# Patient Record
Sex: Male | Born: 1970 | Race: Black or African American | Hispanic: No | Marital: Married | Smoking: Never smoker
Health system: Southern US, Community
[De-identification: ages and names within clinical notes are randomized; demographics above are authoritative.]

## PROBLEM LIST (undated history)

## (undated) DIAGNOSIS — G8929 Other chronic pain: Secondary | ICD-10-CM

## (undated) DIAGNOSIS — K859 Acute pancreatitis without necrosis or infection, unspecified: Secondary | ICD-10-CM

## (undated) DIAGNOSIS — N200 Calculus of kidney: Secondary | ICD-10-CM

## (undated) DIAGNOSIS — R58 Hemorrhage, not elsewhere classified: Secondary | ICD-10-CM

## (undated) DIAGNOSIS — R52 Pain, unspecified: Secondary | ICD-10-CM

## (undated) DIAGNOSIS — R109 Unspecified abdominal pain: Secondary | ICD-10-CM

## (undated) DIAGNOSIS — N189 Chronic kidney disease, unspecified: Secondary | ICD-10-CM

## (undated) DIAGNOSIS — K56609 Unspecified intestinal obstruction, unspecified as to partial versus complete obstruction: Secondary | ICD-10-CM

## (undated) DIAGNOSIS — I82409 Acute embolism and thrombosis of unspecified deep veins of unspecified lower extremity: Secondary | ICD-10-CM

## (undated) DIAGNOSIS — D61818 Other pancytopenia: Secondary | ICD-10-CM

## (undated) DIAGNOSIS — K567 Ileus, unspecified: Secondary | ICD-10-CM

## (undated) DIAGNOSIS — I10 Essential (primary) hypertension: Secondary | ICD-10-CM

## (undated) DIAGNOSIS — G43909 Migraine, unspecified, not intractable, without status migrainosus: Secondary | ICD-10-CM

## (undated) DIAGNOSIS — M549 Dorsalgia, unspecified: Secondary | ICD-10-CM

## (undated) HISTORY — PX: IVC FILTER PLACEMENT (ARMC HX): HXRAD1551

## (undated) HISTORY — PX: PANCREAS SURGERY: SHX731

## (undated) HISTORY — PX: LITHOTRIPSY: SUR834

## (undated) HISTORY — PX: OTHER SURGICAL HISTORY: SHX169

## (undated) HISTORY — PX: COLON RESECTION: SHX5231

## (undated) HISTORY — PX: HERNIA REPAIR: SHX51

## (undated) HISTORY — PX: COLOSTOMY REVERSAL: SHX5782

---

## 2001-08-02 ENCOUNTER — Emergency Department (HOSPITAL_COMMUNITY): Admission: EM | Admit: 2001-08-02 | Discharge: 2001-08-02 | Payer: Self-pay | Admitting: Emergency Medicine

## 2001-08-02 ENCOUNTER — Encounter: Payer: Self-pay | Admitting: Emergency Medicine

## 2001-10-06 ENCOUNTER — Emergency Department (HOSPITAL_COMMUNITY): Admission: EM | Admit: 2001-10-06 | Discharge: 2001-10-07 | Payer: Self-pay | Admitting: *Deleted

## 2001-10-07 ENCOUNTER — Encounter: Payer: Self-pay | Admitting: *Deleted

## 2003-12-16 ENCOUNTER — Emergency Department (HOSPITAL_COMMUNITY): Admission: EM | Admit: 2003-12-16 | Discharge: 2003-12-17 | Payer: Self-pay | Admitting: *Deleted

## 2004-12-19 ENCOUNTER — Emergency Department (HOSPITAL_COMMUNITY): Admission: EM | Admit: 2004-12-19 | Discharge: 2004-12-19 | Payer: Self-pay | Admitting: Emergency Medicine

## 2007-03-17 ENCOUNTER — Emergency Department (HOSPITAL_COMMUNITY): Admission: EM | Admit: 2007-03-17 | Discharge: 2007-03-17 | Payer: Self-pay | Admitting: Emergency Medicine

## 2007-03-26 ENCOUNTER — Encounter (INDEPENDENT_AMBULATORY_CARE_PROVIDER_SITE_OTHER): Payer: Self-pay | Admitting: General Surgery

## 2007-03-26 ENCOUNTER — Observation Stay (HOSPITAL_COMMUNITY): Admission: RE | Admit: 2007-03-26 | Discharge: 2007-03-27 | Payer: Self-pay | Admitting: General Surgery

## 2007-04-26 ENCOUNTER — Inpatient Hospital Stay (HOSPITAL_COMMUNITY): Admission: EM | Admit: 2007-04-26 | Discharge: 2007-04-29 | Payer: Self-pay | Admitting: Emergency Medicine

## 2007-04-26 ENCOUNTER — Encounter: Payer: Self-pay | Admitting: *Deleted

## 2007-08-27 ENCOUNTER — Ambulatory Visit (HOSPITAL_COMMUNITY): Admission: RE | Admit: 2007-08-27 | Discharge: 2007-08-27 | Payer: Self-pay | Admitting: Family Medicine

## 2007-11-17 ENCOUNTER — Ambulatory Visit (HOSPITAL_COMMUNITY): Admission: RE | Admit: 2007-11-17 | Discharge: 2007-11-17 | Payer: Self-pay | Admitting: Family Medicine

## 2008-03-17 ENCOUNTER — Emergency Department (HOSPITAL_COMMUNITY): Admission: EM | Admit: 2008-03-17 | Discharge: 2008-03-17 | Payer: Self-pay | Admitting: Emergency Medicine

## 2008-07-12 ENCOUNTER — Emergency Department (HOSPITAL_COMMUNITY): Admission: EM | Admit: 2008-07-12 | Discharge: 2008-07-12 | Payer: Self-pay | Admitting: Emergency Medicine

## 2008-07-19 ENCOUNTER — Emergency Department (HOSPITAL_COMMUNITY): Admission: EM | Admit: 2008-07-19 | Discharge: 2008-07-19 | Payer: Self-pay | Admitting: Emergency Medicine

## 2008-07-20 ENCOUNTER — Emergency Department (HOSPITAL_COMMUNITY): Admission: EM | Admit: 2008-07-20 | Discharge: 2008-07-20 | Payer: Self-pay | Admitting: Emergency Medicine

## 2008-07-28 ENCOUNTER — Ambulatory Visit: Payer: Self-pay | Admitting: Internal Medicine

## 2008-11-14 ENCOUNTER — Emergency Department (HOSPITAL_COMMUNITY): Admission: EM | Admit: 2008-11-14 | Discharge: 2008-11-14 | Payer: Self-pay | Admitting: Emergency Medicine

## 2009-10-12 ENCOUNTER — Emergency Department (HOSPITAL_COMMUNITY): Admission: EM | Admit: 2009-10-12 | Discharge: 2009-10-12 | Payer: Self-pay | Admitting: Emergency Medicine

## 2010-12-24 LAB — COMPREHENSIVE METABOLIC PANEL
Albumin: 4.1 g/dL (ref 3.5–5.2)
Alkaline Phosphatase: 66 U/L (ref 39–117)
BUN: 16 mg/dL (ref 6–23)
Calcium: 9.4 mg/dL (ref 8.4–10.5)
Creatinine, Ser: 1.44 mg/dL (ref 0.4–1.5)
GFR calc Af Amer: >60 mL/min (ref >60)

## 2010-12-24 LAB — DIFFERENTIAL
Basophils Relative: 0 % (ref 0–1)
Eosinophils Absolute: 0.1 10*3/uL (ref 0.0–0.7)
Eosinophils Relative: 1 % (ref 0–5)
Lymphocytes Relative: 24 % (ref 12–46)
Monocytes Relative: 9 % (ref 3–12)

## 2010-12-24 LAB — RAPID URINE DRUG SCREEN, HOSP PERFORMED
Cocaine: NOT DETECTED
Opiates: NOT DETECTED
Tetrahydrocannabinol: NOT DETECTED

## 2010-12-24 LAB — URINALYSIS, ROUTINE W REFLEX MICROSCOPIC
Leukocytes, UA: NEGATIVE
Protein, ur: 30 mg/dL — AB
Specific Gravity, Urine: >1.03 — ABNORMAL HIGH (ref 1.005–1.030)
Urobilinogen, UA: 0.2 mg/dL (ref 0.0–1.0)
pH: 5.5 (ref 5.0–8.0)

## 2010-12-24 LAB — POCT CARDIAC MARKERS: Troponin i, poc: <0.05 ng/mL (ref 0.00–0.09)

## 2010-12-24 LAB — CBC
HCT: 42.3 % (ref 39.0–52.0)
Hemoglobin: 14.4 g/dL (ref 13.0–17.0)
MCV: 82 fL (ref 78.0–100.0)

## 2010-12-24 LAB — LIPASE, BLOOD: Lipase: 35 U/L (ref 11–59)

## 2011-01-23 LAB — CBC
MCV: 82.6 fL (ref 78.0–100.0)
RBC: 5.14 MIL/uL (ref 4.22–5.81)

## 2011-01-23 LAB — COMPREHENSIVE METABOLIC PANEL
ALT: 41 U/L (ref 0–53)
Albumin: 3.8 g/dL (ref 3.5–5.2)
BUN: 12 mg/dL (ref 6–23)
CO2: 27 mEq/L (ref 19–32)
Calcium: 9.5 mg/dL (ref 8.4–10.5)
Creatinine, Ser: 1.53 mg/dL — ABNORMAL HIGH (ref 0.4–1.5)
GFR calc Af Amer: >60 mL/min (ref >60)
Potassium: 3.3 mEq/L — ABNORMAL LOW (ref 3.5–5.1)
Total Bilirubin: 1.3 mg/dL — ABNORMAL HIGH (ref 0.3–1.2)
Total Protein: 7.9 g/dL (ref 6.0–8.3)

## 2011-01-23 LAB — URINE MICROSCOPIC-ADD ON

## 2011-01-23 LAB — URINE CULTURE: Colony Count: 15000

## 2011-01-23 LAB — DIFFERENTIAL
Lymphs Abs: 0.7 10*3/uL (ref 0.7–4.0)
Monocytes Relative: 10 % (ref 3–12)
Neutro Abs: 2.7 10*3/uL (ref 1.7–7.7)
Neutrophils Relative %: 70 % (ref 43–77)

## 2011-01-23 LAB — URINALYSIS, ROUTINE W REFLEX MICROSCOPIC
Leukocytes, UA: NEGATIVE
Urobilinogen, UA: 0.2 mg/dL (ref 0.0–1.0)

## 2011-02-20 NOTE — Op Note (Signed)
NAMEELHADJ, GIRTON NO.:  [id number]   MEDICAL RECORD NO.:  [id number]          PATIENT TYPE:  OBV   LOCATION:  A329                          FACILITY:  [facility]   PHYSICIAN:  Barbaraann Barthel, M.D. DATE OF BIRTH:  1971-08-22   DATE OF PROCEDURE:  03/26/2007  DATE OF DISCHARGE:                               OPERATIVE REPORT   SURGEON:  Barbaraann Barthel, M.D.   PREOPERATIVE DIAGNOSES:  Internal and external hemorrhoids.   POSTOPERATIVE DIAGNOSES:  1. Internal and external hemorrhoids.  2. Question of a perianal mass, possibly a sebaceous cyst.   PROCEDURES:  1. Internal and external hemorrhoidectomy, with excision of perianal      mass.  2. Rigid proctoscopy to 25 cm.   SPECIMENS:  Hemorrhoidal tissue with a possible perianal mass.   OPERATIVE NOTE:  This is a 40 year old black male, who was a little  difficult to examine in the office.  He had presumptively internal and  external hemorrhoids which were very tender.  We discussed the need for  surgery with him, discussing complications not limited to, but including  bleeding and infection.  An informed consent was obtained.   GROSS OPERATIVE FINDINGS:  The patient had, what appeared to be,  possibly a sebaceous cyst on the left side in the left anterior side of  the perianal ring; and, some hemorrhoidal tissue which was prominent  internally in the same area.   TECHNIQUE:  The patient was placed in the jackknife prone position,  after adequate administration of spinal anesthesia.  We then did a  rectal examination and a rigid proctoscopy to 25 cm.  No abnormalities  were encountered.  I then anesthetized this area to elevate the lesion,  and then dissected this and put a Bowie hemorrhoidal clamp around this.  I then excised it with the cautery device.  I oversewed this with a 2-0  chromic suture, beginning in the at the apex internally and then looping  this over the [facility] hemorrhoidal clamp.  I then  removed the clamp,  tightening the sutures; and then suturing from external to internally  with a locking suture, and then ligating this internally.  We checked  for hemostasis and we then irrigated with normal saline solution.  I  used a perianal block of 0.5% Sensorcaine without epinephrine.  We  checked for hemostasis and this was deemed complete.   DISPOSITION:  The patient tolerated procedure well; was taken to the  recovery room in satisfactory condition.   ESTIMATED BLOOD LOSS:  Minimal.   FLUIDS:  The patient received 100 mL of Crystalloid intraoperatively.   COMPLICATIONS:  There were no complications.      Barbaraann Barthel, M.D.  Electronically Signed     WB/MEDQ  D:  03/26/2007  T:  03/26/2007  Job:  [id number]
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
## 2011-02-20 NOTE — H&P (Signed)
NAME:  Peter [name], Peter [name]               ACCOUNT NO.:  [id number]   MEDICAL RECORD NO.:  [id number]          PATIENT TYPE:  AMB   LOCATION:  DAY                           FACILITY:  [facility]   PHYSICIAN:  R. Roetta Sessions, M.D. DATE OF BIRTH:  1971/07/20   DATE OF ADMISSION:  DATE OF DISCHARGE:                              HISTORY & PHYSICAL   REFERRING PHYSICIAN:  Patrica Duel, MD   REASON FOR CONSULTATION:  Hematemesis, chest and abdominal pain.   HISTORY OF PRESENT ILLNESS:  Peter [name] is a pleasant 40 year old  African American male sent at the courtesy of [facility] to further evaluate a several-week history of left-sided  chest, shoulder, and abdominal pain.  He has a history of multiple  anxiety attacks where his wife accompanies him today states he becomes  jumpy, agitated, and has hallucinations.  He has been to the emergency  room on couple of occasions for this problem.  On one occasion, it is  reported that he developed hematemesis while in the emergency  department.  There was 1 episode blood was seen in his vomitus.  There  was no further associated symptoms.  Peter [name] has heartburn symptoms 2-  3 times weekly.  He is not on any acid suppression.  No odynophagia, no  dysphagia, or early satiety.  He has not had any melena or rectal  bleeding.  Abdominal pain, sometimes occurs with left-sided chest pain,  but is not affected by eating.  He denies weight loss.  Abdominal pain  is not affected by having a bowel movement.  He has been taking four to  six 200 mg ibuprofens daily for headache as well.  He saw Dr. Orvan Falconer  recently and he is going to have a stress test in [facility] tomorrow to  further evaluate his atypical chest pain.  He does tell me that he  drinks liquor in the form of weekend shots with his friends.  He has  been utilizing more weekend alcohol over the past several weeks citing  increased stress, but stopped drinking completely 1 week  ago.  He denies  illicit drug use.  He was started on Librium recently by the folks down  at [facility].  He also takes Lortab p.r.n. pain.  There is no  history of peptic ulcer disease or other gastrointestinal illness aside  from having hemorrhoid surgery performed by Dr. Malvin Johns previously.  He  was seen by Dr. Colon Branch in ED on July 19, 2008.  He had a battery of  labs.  Drug screen was negative.  CBC revealed white count of 3.8, H&H  13.7, 40.3, MCV 82.6.  Sodium 136, potassium 3.8, chloride 103, carbon  dioxide 30, glucose 88.  BUN 11, creatinine 1.07, calcium 9.6, albumin  4.2, SGOT 36, SGPT 29, total bilirubin 0.5.   PAST MEDICAL HISTORY:  Significant for chronic back pain, anxiety,  neurosis, and headaches.   PAST SURGERIES:  Hemorrhoidal surgery by Dr. Malvin Johns.   CURRENT MEDICATIONS:  1. Lortab p.r.n.  2. Librium 25 mg 1 t.i.d. to q.i.d.  3. Ibuprofen  OTC p.r.n.   ALLERGIES:  No known drug allergies   FAMILY HISTORY:  Father had MI but he is in his 44s.  He is alive.  Mother age 50 in good health.  No history of chronic GI or liver  illness.   SOCIAL HISTORY:  The patient is married, he has 5 children, all healthy,  1 with asthma.  He is employed as a Estate agent, but he has been  taken out of work while the current medical evaluation is being done.  He does not use tobacco.  Alcohol consumption is as outlined above.  No  illicit drug use.   REVIEW OF SYSTEMS:  No change in weight.  No fever, chills, or night  sweats.  The patient has chronic insomnia.  He snores heavily.  His wife  says he stops breathing frequently during the night and has to be  awakened.   PHYSICAL EXAMINATION:  GENERAL:  Pleasant 40 year old gentleman resting  comfortably accompanied by his wife.  VITAL SIGNS:  Weight 237, height 5 feet 10, temp 98.2, BP 122/92, and  pulse 72.  SKIN:  Warm and dry.  HEENT:  No scleral icterus.  Conjunctivae pink.  Oral cavity no lesions.   CHEST:  Lungs clear to auscultation.  CARDIAC:  Regular rate and rhythm without murmur, rub, or gallop.  ABDOMEN:  Obese, nondistended.  Positive bowel sounds, entirely soft,  nontender without appreciable mass or organomegaly.  EXTREMITIES:  No  edema.   IMPRESSION:  Peter [name] is a very pleasant 40 year old gentleman with  recent problems with atypical left-sided chest and abdominal pain and  anxiety attacks.  He had a single episode of vomiting with blood in  vomitus in the ED recently.  He has been taking quite a bit of ibuprofen  over the past several weeks.   He certainly at this point does not appear to be in any acute distress.  He does have background symptoms of gastroesophageal reflux disease.   I am glad to see he is getting a cardiac evaluation, although his chest  symptoms are quite atypical for coronary artery disease (as they do for  GI etiology as well).   RECOMMENDATIONS:  1. I told Peter [name] not take any more nonsteroidal agents until we      sort out his GI complaints.  2. We will start him on some Aciphex 20 mg orally daily and 30 mg      before breakfast, 14 samples provided.  3. We will go ahead and offer Ms. Katherine [name] an EGD in the near future to      further evaluate his hematemesis.   Because of his recent problems with anxiety and his alcohol consumption,  I feel he will be difficult as a conscious sedation on the procedure.  Therefore, I will list the services of [facility]. [facility] to help  [place] with a propofol anesthesia in the operating room.  We will make  further recommendations in the very near future.   I would thank Dr. [facility] for allowing me to see this  nice gentleman today.      Jonathon Bellows, M.D.  Electronically Signed     RMR/MEDQ  D:  07/28/2008  T:  07/29/2008  Job:  [id number]

## 2011-02-27 ENCOUNTER — Emergency Department (HOSPITAL_COMMUNITY)
Admission: EM | Admit: 2011-02-27 | Discharge: 2011-02-27 | Disposition: A | Discharge status: Home / Self Care | Payer: 59 | Attending: Emergency Medicine | Admitting: Emergency Medicine

## 2011-02-27 ENCOUNTER — Emergency Department (HOSPITAL_COMMUNITY): Payer: 59

## 2011-02-27 DIAGNOSIS — R42 Dizziness and giddiness: Secondary | ICD-10-CM | POA: Insufficient documentation

## 2011-02-27 DIAGNOSIS — F411 Generalized anxiety disorder: Secondary | ICD-10-CM | POA: Insufficient documentation

## 2011-02-27 DIAGNOSIS — I10 Essential (primary) hypertension: Secondary | ICD-10-CM | POA: Insufficient documentation

## 2011-02-27 DIAGNOSIS — M79609 Pain in unspecified limb: Secondary | ICD-10-CM | POA: Insufficient documentation

## 2011-07-09 LAB — COMPREHENSIVE METABOLIC PANEL
ALT: 29
BUN: 11
CO2: 30
Calcium: 9.5
Creatinine, Ser: 1.07
GFR calc non Af Amer: >60
Glucose, Bld: 88

## 2011-07-09 LAB — DIFFERENTIAL
Basophils Absolute: 0
Eosinophils Absolute: 0.1
Lymphocytes Relative: 25
Lymphocytes Relative: 28
Lymphocytes Relative: 40
Lymphs Abs: 1
Lymphs Abs: 1.1
Lymphs Abs: 2.1
Monocytes Absolute: 0.3
Monocytes Relative: 7
Neutro Abs: 2.6
Neutro Abs: 2.7
Neutrophils Relative %: 60
Neutrophils Relative %: 67

## 2011-07-09 LAB — RAPID URINE DRUG SCREEN, HOSP PERFORMED
Barbiturates: NOT DETECTED
Benzodiazepines: NOT DETECTED
Cocaine: NOT DETECTED

## 2011-07-09 LAB — ETHANOL: Alcohol, Ethyl (B): 181 — ABNORMAL HIGH

## 2011-07-09 LAB — POCT CARDIAC MARKERS
Myoglobin, poc: 110
Myoglobin, poc: 127
Myoglobin, poc: 213
Myoglobin, poc: 252
Troponin i, poc: <0.05

## 2011-07-09 LAB — CBC
HCT: 39.5
HCT: 40.3
Hemoglobin: 13.3
Hemoglobin: 13.7
Hemoglobin: 13.9
MCHC: 33.9
MCV: 82.6
MCV: 83.1
Platelets: 189
RBC: 4.75
RBC: 4.88
RDW: 15.1
WBC: 3.9 — ABNORMAL LOW
WBC: 5.2

## 2011-07-09 LAB — D-DIMER, QUANTITATIVE: D-Dimer, Quant: 0.46

## 2011-07-09 LAB — BASIC METABOLIC PANEL
BUN: 17
Calcium: 9
Chloride: 110
GFR calc non Af Amer: >60
Glucose, Bld: 108 — ABNORMAL HIGH
Potassium: 4

## 2011-07-09 LAB — TROPONIN I: Troponin I: 0.02

## 2011-07-23 LAB — BASIC METABOLIC PANEL
BUN: 11
CO2: 28
Calcium: 8.1 — ABNORMAL LOW
Calcium: 8.3 — ABNORMAL LOW
Creatinine, Ser: 1.37
GFR calc Af Amer: >60
GFR calc non Af Amer: 58 — ABNORMAL LOW
Potassium: 3.6
Sodium: 137

## 2011-07-23 LAB — DIFFERENTIAL
Eosinophils Absolute: 0
Lymphocytes Relative: 24
Lymphs Abs: 1.5
Neutrophils Relative %: 67

## 2011-07-23 LAB — COMPREHENSIVE METABOLIC PANEL
CO2: 26
Calcium: 8.3 — ABNORMAL LOW
Creatinine, Ser: 2.14 — ABNORMAL HIGH
GFR calc non Af Amer: 35 — ABNORMAL LOW
Glucose, Bld: 121 — ABNORMAL HIGH

## 2011-07-23 LAB — CBC
HCT: 34.4 — ABNORMAL LOW
Hemoglobin: 12.4 — ABNORMAL LOW
MCHC: 33.4
MCHC: 33.5
MCV: 81.7
MCV: 81.6
Platelets: 146 — ABNORMAL LOW
Platelets: 157
RBC: 4.55
RDW: 14.5 — ABNORMAL HIGH
RDW: 14.8 — ABNORMAL HIGH
WBC: 4.6

## 2011-07-23 LAB — URINALYSIS, ROUTINE W REFLEX MICROSCOPIC
Glucose, UA: NEGATIVE
Ketones, ur: NEGATIVE
Protein, ur: 30 — AB

## 2011-07-23 LAB — OCCULT BLOOD X 1 CARD TO LAB, STOOL: Fecal Occult Bld: NEGATIVE

## 2011-07-25 LAB — CBC
HCT: 41.2
Hemoglobin: 14.4
WBC: 4

## 2011-07-25 LAB — BASIC METABOLIC PANEL
Calcium: 9.6
GFR calc Af Amer: >60
GFR calc non Af Amer: 56 — ABNORMAL LOW
Potassium: 4.1
Sodium: 134 — ABNORMAL LOW

## 2011-07-25 LAB — DIFFERENTIAL
Basophils Absolute: 0
Eosinophils Relative: 3
Lymphocytes Relative: 32
Lymphs Abs: 1.3
Monocytes Absolute: 0.4
Neutro Abs: 2.2

## 2011-10-15 ENCOUNTER — Other Ambulatory Visit: Payer: Self-pay

## 2011-10-15 ENCOUNTER — Inpatient Hospital Stay (HOSPITAL_COMMUNITY)
Admission: EM | Admit: 2011-10-15 | Discharge: 2011-10-16 | DRG: 313 | Disposition: A | Discharge status: Home / Self Care | Payer: Self-pay | Attending: Internal Medicine | Admitting: Internal Medicine

## 2011-10-15 ENCOUNTER — Emergency Department (HOSPITAL_COMMUNITY): Payer: Self-pay

## 2011-10-15 DIAGNOSIS — I10 Essential (primary) hypertension: Secondary | ICD-10-CM | POA: Diagnosis present

## 2011-10-15 DIAGNOSIS — E669 Obesity, unspecified: Secondary | ICD-10-CM | POA: Diagnosis present

## 2011-10-15 DIAGNOSIS — R079 Chest pain, unspecified: Secondary | ICD-10-CM | POA: Diagnosis present

## 2011-10-15 DIAGNOSIS — R000 Tachycardia, unspecified: Secondary | ICD-10-CM | POA: Diagnosis present

## 2011-10-15 DIAGNOSIS — F1010 Alcohol abuse, uncomplicated: Secondary | ICD-10-CM | POA: Diagnosis present

## 2011-10-15 DIAGNOSIS — M79609 Pain in unspecified limb: Secondary | ICD-10-CM | POA: Diagnosis present

## 2011-10-15 DIAGNOSIS — R0789 Other chest pain: Principal | ICD-10-CM | POA: Diagnosis present

## 2011-10-15 DIAGNOSIS — R0602 Shortness of breath: Secondary | ICD-10-CM | POA: Diagnosis present

## 2011-10-15 DIAGNOSIS — Z6830 Body mass index (BMI) 30.0-30.9, adult: Secondary | ICD-10-CM

## 2011-10-15 HISTORY — DX: Essential (primary) hypertension: I10

## 2011-10-15 LAB — RAPID URINE DRUG SCREEN, HOSP PERFORMED
Barbiturates: NOT DETECTED
Benzodiazepines: NOT DETECTED
Cocaine: NOT DETECTED

## 2011-10-15 LAB — BASIC METABOLIC PANEL
BUN: 14 mg/dL (ref 6–23)
CO2: 27 mEq/L (ref 19–32)
Calcium: 9.7 mg/dL (ref 8.4–10.5)
Chloride: 98 mEq/L (ref 96–112)
Creatinine, Ser: 1.34 mg/dL (ref 0.50–1.35)

## 2011-10-15 LAB — CBC
HCT: 41 % (ref 39.0–52.0)
Hemoglobin: 13.3 g/dL (ref 13.0–17.0)
MCHC: 32.4 g/dL (ref 30.0–36.0)
MCV: 83.2 fL (ref 78.0–100.0)
RDW: 14.6 % (ref 11.5–15.5)
WBC: 2.5 10*3/uL — ABNORMAL LOW (ref 4.0–10.5)

## 2011-10-15 LAB — LIPID PANEL
HDL: 42 mg/dL (ref >39)
LDL Cholesterol: 85 mg/dL (ref 0–99)
Total CHOL/HDL Ratio: 4.6 RATIO
Triglycerides: 324 mg/dL — ABNORMAL HIGH (ref <150)

## 2011-10-15 LAB — D-DIMER, QUANTITATIVE: D-Dimer, Quant: 1.78 ug/mL-FEU — ABNORMAL HIGH (ref 0.00–0.48)

## 2011-10-15 LAB — DIFFERENTIAL
Basophils Absolute: 0 10*3/uL (ref 0.0–0.1)
Eosinophils Relative: 1 % (ref 0–5)
Lymphocytes Relative: 30 % (ref 12–46)
Monocytes Absolute: 0.3 10*3/uL (ref 0.1–1.0)
Monocytes Relative: 12 % (ref 3–12)
Neutro Abs: 1.4 10*3/uL — ABNORMAL LOW (ref 1.7–7.7)

## 2011-10-15 LAB — POCT I-STAT TROPONIN I

## 2011-10-15 MED ORDER — ADULT MULTIVITAMIN W/MINERALS CH
1.0000 | ORAL_TABLET | Freq: Every day | ORAL | Status: DC
  Administered 2011-10-15 – 2011-10-16 (×2): 1 via ORAL
  Filled 2011-10-15 (×2): qty 1

## 2011-10-15 MED ORDER — THIAMINE HCL 100 MG/ML IJ SOLN: 100.0000 mg | Freq: Every day | INTRAMUSCULAR | Status: DC

## 2011-10-15 MED ORDER — ONDANSETRON HCL 4 MG PO TABS: 4.0000 mg | ORAL_TABLET | Freq: Four times a day (QID) | ORAL | Status: DC | PRN

## 2011-10-15 MED ORDER — ONDANSETRON HCL 4 MG/2ML IJ SOLN: 4.0000 mg | Freq: Four times a day (QID) | INTRAMUSCULAR | Status: DC | PRN

## 2011-10-15 MED ORDER — LORAZEPAM 2 MG/ML IJ SOLN: 0.0000 mg | Freq: Two times a day (BID) | INTRAMUSCULAR | Status: DC

## 2011-10-15 MED ORDER — ASPIRIN 81 MG PO CHEW
324.0000 mg | CHEWABLE_TABLET | Freq: Once | ORAL | Status: AC
  Administered 2011-10-15: 324 mg via ORAL
  Filled 2011-10-15: qty 4

## 2011-10-15 MED ORDER — SODIUM CHLORIDE 0.9 % IJ SOLN
INTRAMUSCULAR | Status: AC
  Administered 2011-10-15: 10 mL
  Filled 2011-10-15: qty 3

## 2011-10-15 MED ORDER — ACETAMINOPHEN 650 MG RE SUPP: 650.0000 mg | Freq: Four times a day (QID) | RECTAL | Status: DC | PRN

## 2011-10-15 MED ORDER — ENOXAPARIN SODIUM 40 MG/0.4ML ~~LOC~~ SOLN
40.0000 mg | SUBCUTANEOUS | Status: DC
  Administered 2011-10-15: 40 mg via SUBCUTANEOUS
  Filled 2011-10-15: qty 0.4

## 2011-10-15 MED ORDER — ASPIRIN EC 325 MG PO TBEC
325.0000 mg | DELAYED_RELEASE_TABLET | Freq: Every day | ORAL | Status: DC
  Administered 2011-10-15 – 2011-10-16 (×2): 325 mg via ORAL
  Filled 2011-10-15 (×2): qty 1

## 2011-10-15 MED ORDER — SODIUM CHLORIDE 0.45 % IV SOLN
INTRAVENOUS | Status: DC
  Administered 2011-10-15: 17:00:00 via INTRAVENOUS
  Administered 2011-10-16: 1000 mL via INTRAVENOUS
  Administered 2011-10-16: 13:00:00 via INTRAVENOUS

## 2011-10-15 MED ORDER — LORAZEPAM 2 MG/ML IJ SOLN
1.0000 mg | Freq: Four times a day (QID) | INTRAMUSCULAR | Status: DC | PRN
  Administered 2011-10-16: 1 mg via INTRAVENOUS

## 2011-10-15 MED ORDER — HYDROCODONE-ACETAMINOPHEN 5-325 MG PO TABS
1.0000 | ORAL_TABLET | ORAL | Status: DC | PRN
  Administered 2011-10-16: 1 via ORAL
  Filled 2011-10-15: qty 1

## 2011-10-15 MED ORDER — PANTOPRAZOLE SODIUM 40 MG PO TBEC
40.0000 mg | DELAYED_RELEASE_TABLET | Freq: Every day | ORAL | Status: DC
  Administered 2011-10-15 – 2011-10-16 (×2): 40 mg via ORAL
  Filled 2011-10-15 (×2): qty 1

## 2011-10-15 MED ORDER — ONDANSETRON HCL 4 MG/2ML IJ SOLN
4.0000 mg | Freq: Once | INTRAMUSCULAR | Status: AC
  Administered 2011-10-15: 4 mg via INTRAVENOUS

## 2011-10-15 MED ORDER — NITROGLYCERIN 0.4 MG SL SUBL
0.4000 mg | SUBLINGUAL_TABLET | Freq: Once | SUBLINGUAL | Status: AC
  Administered 2011-10-15: 0.4 mg via SUBLINGUAL
  Filled 2011-10-15: qty 25

## 2011-10-15 MED ORDER — SODIUM CHLORIDE 0.9 % IJ SOLN
INTRAMUSCULAR | Status: AC
  Administered 2011-10-15: 3 mL
  Filled 2011-10-15: qty 3

## 2011-10-15 MED ORDER — ACETAMINOPHEN 325 MG PO TABS: 650.0000 mg | ORAL_TABLET | Freq: Four times a day (QID) | ORAL | Status: DC | PRN

## 2011-10-15 MED ORDER — FOLIC ACID 1 MG PO TABS
1.0000 mg | ORAL_TABLET | Freq: Every day | ORAL | Status: DC
  Administered 2011-10-15 – 2011-10-16 (×2): 1 mg via ORAL
  Filled 2011-10-15 (×2): qty 1

## 2011-10-15 MED ORDER — LORAZEPAM 1 MG PO TABS: 1.0000 mg | ORAL_TABLET | Freq: Four times a day (QID) | ORAL | Status: DC | PRN

## 2011-10-15 MED ORDER — VITAMIN B-1 100 MG PO TABS
100.0000 mg | ORAL_TABLET | Freq: Every day | ORAL | Status: DC
  Administered 2011-10-15 – 2011-10-16 (×2): 100 mg via ORAL
  Filled 2011-10-15 (×2): qty 1

## 2011-10-15 MED ORDER — LORAZEPAM 2 MG/ML IJ SOLN
0.0000 mg | Freq: Four times a day (QID) | INTRAMUSCULAR | Status: DC
  Administered 2011-10-15 – 2011-10-16 (×3): 2 mg via INTRAVENOUS
  Filled 2011-10-15 (×3): qty 1

## 2011-10-15 MED ORDER — ONDANSETRON HCL 4 MG/2ML IJ SOLN
INTRAMUSCULAR | Status: AC
  Filled 2011-10-15: qty 2

## 2011-10-15 MED ORDER — MORPHINE SULFATE 4 MG/ML IJ SOLN
4.0000 mg | Freq: Once | INTRAMUSCULAR | Status: AC
  Administered 2011-10-15: 4 mg via INTRAVENOUS
  Filled 2011-10-15: qty 1

## 2011-10-15 MED ORDER — MORPHINE SULFATE 2 MG/ML IJ SOLN: 1.0000 mg | INTRAMUSCULAR | Status: DC | PRN

## 2011-10-15 NOTE — ED Notes (Signed)
Dr. Kerry Hough here for assessment of pt. Pt still currently waiting for bed placement. Pt aware of delay

## 2011-10-15 NOTE — H&P (Signed)
PCP:   Colette Ribas, MD, MD   Chief Complaint:  Chest pain  HPI: This is a 41 year old African American male with the history of hypertension in the past but is currently not on any medications. Patient also has a history of alcohol abuse. He drinks on a daily basis and drinks hard liquor. He cannot quantify for how much he drinks. He does report that he does have signs of withdrawal he stopped drinking. The patient presents to the emergency room today with complaints of left-sided chest pain. He reports the onset of these symptoms approximately 3 days ago and describes it as a sharp stabbing pain. Pain occurs in the left side of his chest and he reports radiates around to his back and down to his left arm. He does not notice any worsening of the pain by lifting his arm. He thinks it might get worse with deep breathing but he is not sure. He reports the onset of symptoms at rest. Exertion has made the pain worse at times but at other times it has had no effect. He reports that it has improved after receiving some nitroglycerin in the emergency room. He reports associated diaphoresis, and has felt somewhat short of breath. He reports vomiting for the past few days. Denies any diarrhea, dysuria. He's also been somewhat lightheaded.  She reports that he had similar symptoms in the past. He apparently seen a cardiologist in refill, he does not know which group it was. He reports being taken in [place] for a cardiac catheterization and per patient was not found to have any obstructions. I have reviewed the records in both epic and echart and cannot find any catheterization reports. He is apparently also seen gastroenterology in the past for chest pain and vomiting. Per records he was undergoing a concurrent cardiology evaluation at that time as well. Details of his prior cardiac workup are not entirely clear at this time.  Patient was evaluated in the emergency room, EKG did not show any acute  changes, point-of-care troponins have been negative, patient has been referred for admission to rule out myocardial infarction.  Allergies:  No Known Allergies    Past Medical History  Diagnosis Date  . Hypertension     Past Surgical History  Procedure Date  . Hernia repair     Prior to Admission medications   Medication Sig Start Date End Date Taking? Authorizing Provider  acetaminophen (TYLENOL) 500 MG tablet Take 1,000 mg by mouth every 6 (six) hours as needed. For pain    Yes Historical Provider, MD  ibuprofen (ADVIL,MOTRIN) 200 MG tablet Take 400 mg by mouth every 6 (six) hours as needed. For pain    Yes Historical Provider, MD    Social History:  reports that he has never smoked. He does not have any smokeless tobacco history on file. He reports that he drinks alcohol. He reports that he does not use illicit drugs. his last drink was approximately 2 days ago.  No family history on file.  Review of Systems: Positives in bold Constitutional: Denies fever, chills, diaphoresis, appetite change and fatigue.  HEENT: Denies photophobia, eye pain, redness, hearing loss, ear pain, congestion, sore throat, rhinorrhea, sneezing, mouth sores, trouble swallowing, neck pain, neck stiffness and tinnitus.   Respiratory: Denies SOB, DOE, cough, chest tightness,  and wheezing.   Cardiovascular: Denies chest pain, palpitations and leg swelling.  Gastrointestinal: Denies nausea, vomiting, abdominal pain, diarrhea, constipation, blood in stool and abdominal distention.  Genitourinary: Denies dysuria, urgency,  frequency, hematuria, flank pain and difficulty urinating.  Musculoskeletal: Denies myalgias, back pain, joint swelling, arthralgias and gait problem.  Skin: Denies pallor, rash and wound.  Neurological: Denies dizziness, seizures, syncope, weakness, light-headedness, numbness and headaches.  Hematological: Denies adenopathy. Easy bruising, personal or family bleeding history    Psychiatric/Behavioral: Denies suicidal ideation, mood changes, confusion, nervousness, sleep disturbance and agitation   Physical Exam: Blood pressure 141/101, pulse 83, temperature 98.3 F (36.8 C), temperature source Oral, resp. rate 17, height 5\' 10"  (1.778 m), weight 117.935 kg (260 lb), SpO2 98.00%. General: Patient is in no acute distress, sitting up in bed, he doesappear to be diaphoretic HEENT: Normocephalic, atraumatic, pupils are equal round reactive to light Neck: Supple Chest: Clear to auscultation bilaterally, no reproducible chest pain on palpation Cardiac: S1, S2, tachycardic, regular Abdomen: Soft, nontender, nondistended, bowel sounds are active Extremities: No edema, cyanosis, or clubbing Neurologic: Grossly intact, non focal Skin: Warm, diaphoretic  Labs on Admission:  Results for orders placed during the hospital encounter of 10/15/11 (from the past 48 hour(s))  POCT I-STAT TROPONIN I     Status: Normal   Collection Time   10/15/11  9:41 AM      Component Value Range Comment   Troponin i, poc 0.00  0.00 - 0.08 (ng/mL)    Comment 3            CBC     Status: Abnormal   Collection Time   10/15/11  9:44 AM      Component Value Range Comment   WBC 2.5 (*) 4.0 - 10.5 (K/uL)    RBC 4.93  4.22 - 5.81 (MIL/uL)    Hemoglobin 13.3  13.0 - 17.0 (g/dL)    HCT 13.0  86.5 - 78.4 (%)    MCV 83.2  78.0 - 100.0 (fL)    MCH 27.0  26.0 - 34.0 (pg)    MCHC 32.4  30.0 - 36.0 (g/dL)    RDW 69.6  29.5 - 28.4 (%)    Platelets 146 (*) 150 - 400 (K/uL)   DIFFERENTIAL     Status: Abnormal   Collection Time   10/15/11  9:44 AM      Component Value Range Comment   Neutrophils Relative 57  43 - 77 (%)    Neutro Abs 1.4 (*) 1.7 - 7.7 (K/uL)    Lymphocytes Relative 30  12 - 46 (%)    Lymphs Abs 0.7  0.7 - 4.0 (K/uL)    Monocytes Relative 12  3 - 12 (%)    Monocytes Absolute 0.3  0.1 - 1.0 (K/uL)    Eosinophils Relative 1  0 - 5 (%)    Eosinophils Absolute 0.0  0.0 - 0.7 (K/uL)     Basophils Relative 0  0 - 1 (%)    Basophils Absolute 0.0  0.0 - 0.1 (K/uL)   BASIC METABOLIC PANEL     Status: Abnormal   Collection Time   10/15/11  9:44 AM      Component Value Range Comment   Sodium 136  135 - 145 (mEq/L)    Potassium 3.9  3.5 - 5.1 (mEq/L)    Chloride 98  96 - 112 (mEq/L)    CO2 27  19 - 32 (mEq/L)    Glucose, Bld 130 (*) 70 - 99 (mg/dL)    BUN 14  6 - 23 (mg/dL)    Creatinine, Ser 1.32  0.50 - 1.35 (mg/dL)    Calcium 9.7  8.4 -  10.5 (mg/dL)    GFR calc non Af Amer 64 (*) >90 (mL/min)    GFR calc Af Amer 74 (*) >90 (mL/min)   POCT I-STAT TROPONIN I     Status: Normal   Collection Time   10/15/11 11:27 AM      Component Value Range Comment   Troponin i, poc 0.00  0.00 - 0.08 (ng/mL)    Comment 3              Radiological Exams on Admission: Dg Chest Portable 1 View  10/15/2011  *RADIOLOGY REPORT*  Clinical Data: Chest pain and cough.  PORTABLE CHEST - 1 VIEW  Comparison: None  Findings: Heart size and vascularity are normal and the lungs are clear.  No osseous abnormality.  IMPRESSION: Normal chest.  Original Report Authenticated By: Gwynn Burly, M.D.    Assessment/Plan Principal Problem:  *Chest pain Active Problems:  HTN (hypertension)  Tachycardia  Alcohol abuse  Obesity  Plan:  Patient will be admitted to a telemetry unit for further workup. We will cycle his cardiac markers every 8 hours x3. His chest pain has typical and atypical features. We will also ask for cardiology consultation. To further risk stratify him we will check a hemoglobin A1c as well as fasting lipid panel. We will check a 2-D echocardiogram and check a urine drug screen. We will also check a d-dimer, if this is positive then patient will need a CT of the chest to rule out PE. He will be continued on aspirin.  Certainly there is concern for the patient going into delirium tremens. His last drink was approximately 48 hours ago, therefore he is in the window to undergo withdrawal.  We will start him on Ativan CIWA protocol. It is possible that his tachycardia, diaphoresis, hypertension may be related to alcohol withdrawal  The patient's history of alcohol abuse as well as concurrent ibuprofen use may have caused a gastritis which could be contributing to his discomfort. We will start him on a proton pump inhibitor. He'll be gently hydrated with IV fluids  The patient does have some leukopenia which appears to be chronic. This may be due to his alcohol abuse. We will check a serum HIV.  Time Spent on Admission:  Chibueze Beasley [facility] 10/15/2011, 2:56 PM

## 2011-10-15 NOTE — ED Notes (Signed)
Pt c/o lower abd pain per Tarri Glenn RN. Dr. Bebe Shaggy aware and new order placed for morphine.

## 2011-10-15 NOTE — ED Notes (Signed)
Report called and attempted. Per [place] call back. Unable to take report.

## 2011-10-15 NOTE — ED Notes (Signed)
Pt vomiting at this time. Dr. Bebe Shaggy aware and new order received.

## 2011-10-15 NOTE — ED Notes (Signed)
Pt currently waiting for bed placement.  

## 2011-10-15 NOTE — ED Notes (Signed)
C/o left sided chest pain for 3 days. Pt states pain worse when lying down. Also c/o cough as well. Denies any n/v or sob. nad noted.

## 2011-10-15 NOTE — ED Provider Notes (Signed)
History   Scribed for Joya Gaskins, MD, the patient was seen in [facility]. The chart was scribed by Gilman Schmidt. The patients care was started at 9:32 AM.   CSN: [id number]  Arrival date & time 10/15/11  0906   First MD Initiated Contact with Patient 10/15/11 [phone]      Chief Complaint  Patient presents with  . Cough  . Chest Pain     HPI Peter [name] is a 40 y.o. male with a history of HTN who presents to the Emergency Department complaining of cough and left sided chest pain that radiates to arm and side onset three days. Pain is described as a sharp and shooting and lasting 5-6 minutes (off and on all day) then subsiding on its own. States that pain came without exertion while laying in bed. Symptoms are exacerbated by walking. Pt has had similar symptoms and has been seen in ED. Also notes diaphoresis, SOB,  and light headedness. Pt has not taken ASA. States pain is "not too bad currently". There are no other associated symptoms and no other alleviating or aggravating factors.  Denies known h/o DVT/PE   Past Medical History  Diagnosis Date  . Hypertension     Past Surgical History  Procedure Date  . Hernia repair     Fam hx - +CAD  History  Substance Use Topics  . Smoking status: Never Smoker   . Smokeless tobacco: Not on file  . Alcohol Use: Yes      Review of Systems  Constitutional: Positive for diaphoresis.  Respiratory: Positive for cough and shortness of breath.   Cardiovascular: Positive for chest pain.  Gastrointestinal: Negative for nausea, vomiting and diarrhea.  Neurological: Positive for light-headedness.  All other systems reviewed and are negative.    Allergies  Review of patient's allergies indicates no known allergies.  Home Medications  No current outpatient prescriptions on file.  BP 167/116  Pulse 92  Temp(Src) 98.3 F (36.8 C) (Oral)  Resp 18  Ht 5\' 10"  (1.778 m)  Wt 260 lb (117.935 kg)  BMI 37.31 kg/m2  SpO2  98%  Physical Exam CONSTITUTIONAL: Well developed/well nourished HEAD AND FACE: Normocephalic/atraumatic EYES: EOMI/PERRL ENMT: Mucous membranes moist NECK: supple no meningeal signs SPINE:entire spine nontender CV: S1/S2 noted, no murmurs/rubs/gallops noted LUNGS: Lungs are clear to auscultation bilaterally, no apparent distress ABDOMEN: soft, nontender, no rebound or guarding GU:no cva tenderness NEURO: Pt is awake/alert, moves all extremitiesx4 EXTREMITIES: pulses normal, full ROM SKIN: warm, color normal PSYCH: no abnormalities of mood noted  ED Course  Procedures    Labs Reviewed  CBC  DIFFERENTIAL  BASIC METABOLIC PANEL  I-STAT TROPONIN I   DIAGNOSTIC STUDIES: Oxygen Saturation is 98% on room air, normal by my interpretation.    LABS Results for orders placed during the hospital encounter of 10/15/11  CBC      Component Value Range   WBC 2.5 (*) 4.0 - 10.5 (K/uL)   RBC 4.93  4.22 - 5.81 (MIL/uL)   Hemoglobin 13.3  13.0 - 17.0 (g/dL)   HCT 11.9  14.7 - 82.9 (%)   MCV 83.2  78.0 - 100.0 (fL)   MCH 27.0  26.0 - 34.0 (pg)   MCHC 32.4  30.0 - 36.0 (g/dL)   RDW 56.2  13.0 - 86.5 (%)   Platelets 146 (*) 150 - 400 (K/uL)  DIFFERENTIAL      Component Value Range   Neutrophils Relative 57  43 - 77 (%)  Neutro Abs 1.4 (*) 1.7 - 7.7 (K/uL)   Lymphocytes Relative 30  12 - 46 (%)   Lymphs Abs 0.7  0.7 - 4.0 (K/uL)   Monocytes Relative 12  3 - 12 (%)   Monocytes Absolute 0.3  0.1 - 1.0 (K/uL)   Eosinophils Relative 1  0 - 5 (%)   Eosinophils Absolute 0.0  0.0 - 0.7 (K/uL)   Basophils Relative 0  0 - 1 (%)   Basophils Absolute 0.0  0.0 - 0.1 (K/uL)  BASIC METABOLIC PANEL      Component Value Range   Sodium 136  135 - 145 (mEq/L)   Potassium 3.9  3.5 - 5.1 (mEq/L)   Chloride 98  96 - 112 (mEq/L)   CO2 27  19 - 32 (mEq/L)   Glucose, Bld 130 (*) 70 - 99 (mg/dL)   BUN 14  6 - 23 (mg/dL)   Creatinine, Ser 1.61  0.50 - 1.35 (mg/dL)   Calcium 9.7  8.4 - 09.6 (mg/dL)    GFR calc non Af Amer 64 (*) >90 (mL/min)   GFR calc Af Amer 74 (*) >90 (mL/min)  POCT I-STAT TROPONIN I      Component Value Range   Troponin i, poc 0.00  0.00 - 0.08 (ng/mL)   Comment 3              Radiology: DG Chest 1 View. Reviewed by me.   COORDINATION OF CARE: 9:32am:  - Patient evaluated by ED physician, EKG, DG Chest, CBC, Diff, BMP, I-stat ordered IMPRESSION: Normal chest.  Original Report Authenticated By: Gwynn Burly, M.D.  11:39 AM CP improved with NTG Will admit for continued monitoring and chest pain r/o and cardiology evaluation Pt agreeable D/w dr Kerry Hough for admission    MDM  Nursing notes reviewed and considered in documentation All labs/vitals reviewed and considered xrays reviewed and considered Previous records reviewed and considered    Date: 10/15/2011  Rate: 88  Rhythm: normal sinus rhythm  QRS Axis: normal  Intervals: normal  ST/T Wave abnormalities: nonspecific ST changes  Conduction Disutrbances:none  Narrative Interpretation:   Old EKG Reviewed: unchanged LVH noted today and in prior    I personally performed the services described in this documentation, which was scribed in my presence. The recorded information has been reviewed and considered.          Joya Gaskins, MD 10/15/11 1139

## 2011-10-16 DIAGNOSIS — I369 Nonrheumatic tricuspid valve disorder, unspecified: Secondary | ICD-10-CM

## 2011-10-16 LAB — HIV ANTIBODY (ROUTINE TESTING W REFLEX): HIV: NONREACTIVE

## 2011-10-16 LAB — HEMOGLOBIN A1C
Hgb A1c MFr Bld: 6.2 % — ABNORMAL HIGH (ref <5.7)
Mean Plasma Glucose: 131 mg/dL — ABNORMAL HIGH (ref <117)

## 2011-10-16 LAB — CBC
MCH: 28.7 pg (ref 26.0–34.0)
MCHC: 34.4 g/dL (ref 30.0–36.0)
MCV: 83.3 fL (ref 78.0–100.0)
Platelets: 134 10*3/uL — ABNORMAL LOW (ref 150–400)
RDW: 14.6 % (ref 11.5–15.5)

## 2011-10-16 LAB — BASIC METABOLIC PANEL
CO2: 28 mEq/L (ref 19–32)
Calcium: 8.8 mg/dL (ref 8.4–10.5)
Creatinine, Ser: 1.32 mg/dL (ref 0.50–1.35)
GFR calc non Af Amer: 65 mL/min — ABNORMAL LOW (ref >90)
Glucose, Bld: 127 mg/dL — ABNORMAL HIGH (ref 70–99)
Sodium: 135 mEq/L (ref 135–145)

## 2011-10-16 LAB — CARDIAC PANEL(CRET KIN+CKTOT+MB+TROPI)
CK, MB: 2.3 ng/mL (ref 0.3–4.0)
Total CK: 631 U/L — ABNORMAL HIGH (ref 7–232)

## 2011-10-16 MED ORDER — NITROGLYCERIN 0.4 MG SL SUBL: 0.4000 mg | SUBLINGUAL_TABLET | SUBLINGUAL | Status: DC | PRN

## 2011-10-16 MED ORDER — ASPIRIN 325 MG PO TBEC: 325.0000 mg | DELAYED_RELEASE_TABLET | Freq: Every day | ORAL | Status: AC

## 2011-10-16 MED ORDER — FOLIC ACID 1 MG PO TABS: 1.0000 mg | ORAL_TABLET | Freq: Every day | ORAL | Status: DC

## 2011-10-16 MED ORDER — LISINOPRIL 10 MG PO TABS: 10.0000 mg | ORAL_TABLET | Freq: Every day | ORAL | Status: DC

## 2011-10-16 MED ORDER — POTASSIUM CHLORIDE CRYS ER 20 MEQ PO TBCR
40.0000 meq | EXTENDED_RELEASE_TABLET | Freq: Once | ORAL | Status: AC
  Administered 2011-10-16: 40 meq via ORAL
  Filled 2011-10-16: qty 2

## 2011-10-16 MED ORDER — THIAMINE HCL 100 MG PO TABS: 100.0000 mg | ORAL_TABLET | Freq: Every day | ORAL | Status: DC

## 2011-10-16 MED ORDER — OMEPRAZOLE MAGNESIUM 20 MG PO TBEC: 20.0000 mg | DELAYED_RELEASE_TABLET | Freq: Every day | ORAL | Status: DC

## 2011-10-16 NOTE — Progress Notes (Signed)
*  PRELIMINARY RESULTS* Echocardiogram 2D Echocardiogram has been performed.  Peter [name] 10/16/2011, 11:23 AM

## 2011-10-16 NOTE — Progress Notes (Signed)
Patient discharged with prescriptions and instructions verbalizes understanding and importance of getting them filled. Wife verbalizes understanding too. IV cath removed with cath intact and tolerates well.

## 2011-10-16 NOTE — Discharge Summary (Signed)
DISCHARGE SUMMARY  Peter [name]  MR#: [id number]  DOB:01-28-1971  Date of Admission: 10/15/2011 Date of Discharge: 10/16/2011  Attending Physician:Steward Sames  Patient's BJY:NWGNFAO,Peter [name]  Consults:   Discharge Diagnoses: #1 chest pain questionable angina-cardiac enzymes negative and chest x ray normal. #2 history of chronic alcohol use #3 hypertension. #4 questionable GERD  Present on Admission:  .Chest pain .HTN (hypertension) .Tachycardia .Alcohol abuse .Obesity    Current Discharge Medication List    START taking these medications   Details  aspirin EC 325 MG EC tablet Take 1 tablet (325 mg total) by mouth daily. Qty: 30 tablet, Refills: 1    folic acid (FOLVITE) 1 MG tablet Take 1 tablet (1 mg total) by mouth daily. Qty: 30 tablet, Refills: 1    lisinopril (PRINIVIL) 10 MG tablet Take 1 tablet (10 mg total) by mouth daily. Qty: 30 tablet, Refills: 1    nitroGLYCERIN (NITROSTAT) 0.4 MG SL tablet Place 1 tablet (0.4 mg total) under the tongue every 5 (five) minutes as needed for chest pain. Qty: 30 tablet, Refills: 1    omeprazole (PRILOSEC OTC) 20 MG tablet Take 1 tablet (20 mg total) by mouth daily. Qty: 30 tablet, Refills: 1    thiamine 100 MG tablet Take 1 tablet (100 mg total) by mouth daily. Qty: 30 tablet, Refills: 1      CONTINUE these medications which have NOT CHANGED   Details  acetaminophen (TYLENOL) 500 MG tablet Take 1,000 mg by mouth every 6 (six) hours as needed. For pain     ibuprofen (ADVIL,MOTRIN) 200 MG tablet Take 400 mg by mouth every 6 (six) hours as needed. For pain           Hospital Course: Patient is a 41 year old African American male with history of hypertension not on any medication as well as chronic alcohol use was admitted to the hospital on 10/15/2011 with history of chest pain located on the left side of the chest, radiating to the left arm. Patient was said to have  shortness of breath. No history  of diaphoresis. No nausea or vomiting. Pain was said to have been relieved by nitroglycerin. Examination of patient was unremarkable. Patient was admitted to telemetry to be ruled out for ACS. He had a chest x-ray done which was normal. Cardiac enzymes are unremarkable. 2-D echo done on patient showed EF of about 60-65%. Patient was given aspirin 320 mg by mouth and started on gentle IV half normal saline. Patient's pain was controlled with morphine. He was also given lorazepam, folic acid as well as multivitamin. Nausea was controlled with Zofran. GI prophylaxis was done with Protonix and DVT prophylaxis with Lovenox. At the time patient was seen by me, he was chest pain-free and he denied any complaints. A thorough review of patient's hematologic indices as well as electrolytes and urine drug test was unremarkable. EKG was normal. Examination of patient was normal. Vital signs are stable. Plan is for patient to be discharge home today.    Day of Discharge BP 135/93  Pulse 77  Temp(Src) 97.9 F (36.6 C) (Oral)  Resp 20  Ht 5\' 10"  (1.778 m)  Wt 97.569 kg (215 lb 1.6 oz)  BMI 30.86 kg/m2  SpO2 97%  Physical Exam: Vital signs as above. HEENT-mild pallor Neck-no jugular venous distention and no lymphadenopathy Chest-clear to auscultation CVS-S1 and S2. Normal Abdomen-soft, nontender, organs not palpable, bowel sounds are positive. Extremity-no pedal edema Neuro-nonfocal Skin-no ecchymoses MSS-no arthritic changes seen  Results for orders placed during the hospital encounter of 10/15/11 (from the past 24 hour(s))  HEMOGLOBIN A1C     Status: Abnormal   Collection Time   10/15/11  5:30 PM      Component Value Range   Hemoglobin A1C 6.2 (*) <5.7 (%)   Mean Plasma Glucose 131 (*) <117 (mg/dL)  HIV ANTIBODY (ROUTINE TESTING)     Status: Normal   Collection Time   10/15/11  5:30 PM      Component Value Range   HIV NON REACTIVE  NON REACTIVE   D-DIMER, QUANTITATIVE     Status: Abnormal     Collection Time   10/15/11  5:30 PM      Component Value Range   D-Dimer, Quant 1.78 (*) 0.00 - 0.48 (ug/mL-FEU)  LIPID PANEL     Status: Abnormal   Collection Time   10/15/11  5:31 PM      Component Value Range   Cholesterol 192  0 - 200 (mg/dL)   Triglycerides 161 (*) <150 (mg/dL)   HDL 42  >09 (mg/dL)   Total CHOL/HDL Ratio 4.6     VLDL 65 (*) 0 - 40 (mg/dL)   LDL Cholesterol 85  0 - 99 (mg/dL)  URINE RAPID DRUG SCREEN (HOSP PERFORMED)     Status: Abnormal   Collection Time   10/15/11  7:08 PM      Component Value Range   Opiates POSITIVE (*) NONE DETECTED    Cocaine NONE DETECTED  NONE DETECTED    Benzodiazepines NONE DETECTED  NONE DETECTED    Amphetamines NONE DETECTED  NONE DETECTED    Tetrahydrocannabinol NONE DETECTED  NONE DETECTED    Barbiturates NONE DETECTED  NONE DETECTED   CARDIAC PANEL(CRET KIN+CKTOT+MB+TROPI)     Status: Abnormal   Collection Time   10/16/11 12:06 AM      Component Value Range   Total CK 719 (*) 7 - 232 (U/L)   CK, MB 2.3  0.3 - 4.0 (ng/mL)   Troponin I <0.30  <0.30 (ng/mL)   Relative Index 0.3  0.0 - 2.5   BASIC METABOLIC PANEL     Status: Abnormal   Collection Time   10/16/11  5:51 AM      Component Value Range   Sodium 135  135 - 145 (mEq/L)   Potassium 3.3 (*) 3.5 - 5.1 (mEq/L)   Chloride 100  96 - 112 (mEq/L)   CO2 28  19 - 32 (mEq/L)   Glucose, Bld 127 (*) 70 - 99 (mg/dL)   BUN 12  6 - 23 (mg/dL)   Creatinine, Ser 6.04  0.50 - 1.35 (mg/dL)   Calcium 8.8  8.4 - 54.0 (mg/dL)   GFR calc non Af Amer 65 (*) >90 (mL/min)   GFR calc Af Amer 76 (*) >90 (mL/min)  CBC     Status: Abnormal   Collection Time   10/16/11  5:51 AM      Component Value Range   WBC 2.4 (*) 4.0 - 10.5 (K/uL)   RBC 4.43  4.22 - 5.81 (MIL/uL)   Hemoglobin 12.7 (*) 13.0 - 17.0 (g/dL)   HCT 98.1 (*) 19.1 - 52.0 (%)   MCV 83.3  78.0 - 100.0 (fL)   MCH 28.7  26.0 - 34.0 (pg)   MCHC 34.4  30.0 - 36.0 (g/dL)   RDW 47.8  29.5 - 62.1 (%)   Platelets 134 (*) 150 - 400  (K/uL)  CARDIAC PANEL(CRET KIN+CKTOT+MB+TROPI)  Status: Abnormal   Collection Time   10/16/11  5:52 AM      Component Value Range   Total CK 631 (*) 7 - 232 (U/L)   CK, MB 2.2  0.3 - 4.0 (ng/mL)   Troponin I <0.30  <0.30 (ng/mL)   Relative Index 0.3  0.0 - 2.5     Disposition: stable   Follow-up Appts: Discharge Orders    Future Orders Please Complete By Expires   Diet - low sodium heart healthy      Increase activity slowly      Discharge instructions      Comments:   Follow up with pcp in 1-2weeks        Signed: Hayk Divis 10/16/2011, 3:56 PM

## 2011-10-17 ENCOUNTER — Encounter (HOSPITAL_COMMUNITY): Payer: Self-pay | Admitting: *Deleted

## 2011-10-17 ENCOUNTER — Emergency Department (HOSPITAL_COMMUNITY)
Admission: EM | Admit: 2011-10-17 | Discharge: 2011-10-17 | Disposition: A | Discharge status: Home / Self Care | Payer: Self-pay | Attending: Emergency Medicine | Admitting: Emergency Medicine

## 2011-10-17 DIAGNOSIS — T46905A Adverse effect of unspecified agents primarily affecting the cardiovascular system, initial encounter: Secondary | ICD-10-CM | POA: Insufficient documentation

## 2011-10-17 DIAGNOSIS — T7840XA Allergy, unspecified, initial encounter: Secondary | ICD-10-CM

## 2011-10-17 DIAGNOSIS — Z79899 Other long term (current) drug therapy: Secondary | ICD-10-CM | POA: Insufficient documentation

## 2011-10-17 DIAGNOSIS — R220 Localized swelling, mass and lump, head: Secondary | ICD-10-CM | POA: Insufficient documentation

## 2011-10-17 DIAGNOSIS — R221 Localized swelling, mass and lump, neck: Secondary | ICD-10-CM | POA: Insufficient documentation

## 2011-10-17 MED ORDER — DIPHENHYDRAMINE HCL 50 MG/ML IJ SOLN
INTRAMUSCULAR | Status: AC
  Administered 2011-10-17: 25 mg
  Filled 2011-10-17: qty 1

## 2011-10-17 MED ORDER — METHYLPREDNISOLONE SODIUM SUCC 125 MG IJ SOLR
INTRAMUSCULAR | Status: AC
  Administered 2011-10-17: 125 mg via INTRAVENOUS
  Filled 2011-10-17: qty 2

## 2011-10-17 MED ORDER — FAMOTIDINE IN NACL 20-0.9 MG/50ML-% IV SOLN
INTRAVENOUS | Status: AC
Start: 2011-10-17 — End: 2011-10-17
  Administered 2011-10-17: 20 mg
  Filled 2011-10-17: qty 50

## 2011-10-17 MED ORDER — PREDNISONE 10 MG PO TABS: 10.0000 mg | ORAL_TABLET | Freq: Two times a day (BID) | ORAL | Status: DC

## 2011-10-17 NOTE — ED Notes (Signed)
Pt alert & oriented x4, stable gait. Pt given discharge instructions, paperwork & prescription(s), pt verbalized understanding. Pt left department w/ no further questions.  

## 2011-10-17 NOTE — ED Provider Notes (Signed)
History    This chart was scribed for Peter Lennert, MD, MD by Smitty Pluck. The patient was seen in room [facility]14 and the patient's care was started at 7:33PM.   CSN: [id number]  Arrival date & time 10/17/11  1919   First MD Initiated Contact with Patient 10/17/11 1931      Chief Complaint  Patient presents with  . Allergic Reaction    (Consider location/radiation/quality/duration/timing/severity/associated sxs/prior treatment) Patient is a 41 y.o. male presenting with allergic reaction. The history is provided by the patient.  Allergic Reaction   Peter [name] is a 41 y.o. male who presents to the Emergency Department complaining of allergic reaction onset today 6 hours ago. Pt reports taking HTN medication (lisinopril) prior to symptoms occurring. Pt has no other pain. Pt reports taking two Benadryl tablets 4 hours ago without relief. Pt's symptoms have been constant since onset without radiation.   Past Medical History  Diagnosis Date  . Hypertension     Past Surgical History  Procedure Date  . Hernia repair     No family history on file.  History  Substance Use Topics  . Smoking status: Never Smoker   . Smokeless tobacco: Not on file  . Alcohol Use: Yes      Review of Systems  All other systems reviewed and are negative.  10 Systems reviewed and are negative for acute change except as noted in the HPI.   Allergies  Review of patient's allergies indicates no known allergies.  Home Medications   Current Outpatient Rx  Name Route Sig Dispense Refill  . ACETAMINOPHEN 500 MG PO TABS Oral Take 1,000 mg by mouth every 6 (six) hours as needed. For pain     . ASPIRIN 325 MG PO TBEC Oral Take 1 tablet (325 mg total) by mouth daily. 30 tablet 1  . DIPHENHYDRAMINE HCL (SLEEP) 25 MG PO TABS Oral Take 50 mg by mouth as needed. For allergic reaction    . FOLIC ACID 1 MG PO TABS Oral Take 1 tablet (1 mg total) by mouth daily. 30 tablet 1  . IBUPROFEN 200 MG PO TABS  Oral Take 400 mg by mouth every 6 (six) hours as needed. For pain     . LISINOPRIL 10 MG PO TABS Oral Take 1 tablet (10 mg total) by mouth daily. 30 tablet 1  . OMEPRAZOLE MAGNESIUM 20 MG PO TBEC Oral Take 1 tablet (20 mg total) by mouth daily. 30 tablet 1  . THIAMINE HCL 100 MG PO TABS Oral Take 1 tablet (100 mg total) by mouth daily. 30 tablet 1  . NITROGLYCERIN 0.4 MG SL SUBL Sublingual Place 1 tablet (0.4 mg total) under the tongue every 5 (five) minutes as needed for chest pain. 30 tablet 1  . PREDNISONE 10 MG PO TABS Oral Take 1 tablet (10 mg total) by mouth 2 (two) times daily. 10 tablet 0    BP 128/77  Pulse 86  Temp(Src) 98.9 F (37.2 C) (Oral)  Resp 20  Ht 5\' 10"  (1.778 m)  Wt 216 lb (97.977 kg)  BMI 30.99 kg/m2  SpO2 98%  Physical Exam  Nursing note and vitals reviewed. Constitutional: He is oriented to person, place, and time. He appears well-developed.  HENT:  Head: Normocephalic and atraumatic.       Upper lip on left side swollen  Eyes: Conjunctivae and EOM are normal. No scleral icterus.  Neck: Neck supple. No thyromegaly present.  Cardiovascular: Normal rate and regular rhythm.  Exam reveals no gallop and no friction rub.   No murmur heard. Pulmonary/Chest: No stridor. He has no wheezes. He has no rales. He exhibits no tenderness.  Abdominal: He exhibits no distension. There is no tenderness. There is no rebound.  Musculoskeletal: Normal range of motion. He exhibits no edema.  Lymphadenopathy:    He has no cervical adenopathy.  Neurological: He is oriented to person, place, and time. Coordination normal.  Skin: No rash noted. No erythema.  Psychiatric: He has a normal mood and affect. His behavior is normal.    ED Course  Procedures (including critical care time)  DIAGNOSTIC STUDIES: Oxygen Saturation is 99% on room air, normal by my interpretation.    COORDINATION OF CARE:  10:41PM Recheck: EDP discussed lab results and treatment course with pt. Pt is  feeling better. Pt is ready for discharge.    Labs Reviewed - No data to display No results found.   1. Allergic reaction    Pt improved with tx   MDM    The chart was scribed for me under my direct supervision.  I personally performed the history, physical, and medical decision making and all procedures in the evaluation of this patient.Peter Lennert, MD 10/17/11 2248

## 2011-10-17 NOTE — ED Notes (Signed)
Pt reports his upper lip began to swell this afternoon around 1pm after taking his b/p med, pt reports he took 2 benadryl pta without relief, marked swelling noted to upper lip, pt denies any resp diff, EDP in room upon arrival

## 2011-10-17 NOTE — ED Notes (Signed)
Allergic reaction, lips swollen

## 2011-12-29 ENCOUNTER — Encounter (HOSPITAL_COMMUNITY): Payer: Self-pay | Admitting: *Deleted

## 2011-12-29 ENCOUNTER — Emergency Department (HOSPITAL_COMMUNITY): Payer: Self-pay

## 2011-12-29 ENCOUNTER — Inpatient Hospital Stay (HOSPITAL_COMMUNITY)
Admission: EM | Admit: 2011-12-29 | Discharge: 2012-01-11 | DRG: 438 | Disposition: A | Discharge status: Other Acute Inpatient Hospital | Payer: MEDICAID | Attending: Internal Medicine | Admitting: Internal Medicine

## 2011-12-29 DIAGNOSIS — K560 Paralytic ileus: Secondary | ICD-10-CM | POA: Diagnosis not present

## 2011-12-29 DIAGNOSIS — R7309 Other abnormal glucose: Secondary | ICD-10-CM | POA: Diagnosis not present

## 2011-12-29 DIAGNOSIS — N179 Acute kidney failure, unspecified: Secondary | ICD-10-CM | POA: Diagnosis present

## 2011-12-29 DIAGNOSIS — K859 Acute pancreatitis without necrosis or infection, unspecified: Principal | ICD-10-CM | POA: Diagnosis present

## 2011-12-29 DIAGNOSIS — R143 Flatulence: Secondary | ICD-10-CM | POA: Diagnosis not present

## 2011-12-29 DIAGNOSIS — I10 Essential (primary) hypertension: Secondary | ICD-10-CM | POA: Diagnosis present

## 2011-12-29 DIAGNOSIS — F1010 Alcohol abuse, uncomplicated: Secondary | ICD-10-CM | POA: Diagnosis present

## 2011-12-29 DIAGNOSIS — E781 Pure hyperglyceridemia: Secondary | ICD-10-CM | POA: Diagnosis present

## 2011-12-29 DIAGNOSIS — J9819 Other pulmonary collapse: Secondary | ICD-10-CM | POA: Diagnosis not present

## 2011-12-29 DIAGNOSIS — R110 Nausea: Secondary | ICD-10-CM | POA: Diagnosis present

## 2011-12-29 DIAGNOSIS — K7010 Alcoholic hepatitis without ascites: Secondary | ICD-10-CM | POA: Diagnosis present

## 2011-12-29 DIAGNOSIS — K760 Fatty (change of) liver, not elsewhere classified: Secondary | ICD-10-CM | POA: Diagnosis present

## 2011-12-29 DIAGNOSIS — N170 Acute kidney failure with tubular necrosis: Secondary | ICD-10-CM | POA: Diagnosis present

## 2011-12-29 DIAGNOSIS — R739 Hyperglycemia, unspecified: Secondary | ICD-10-CM | POA: Diagnosis not present

## 2011-12-29 DIAGNOSIS — F10239 Alcohol dependence with withdrawal, unspecified: Secondary | ICD-10-CM | POA: Diagnosis not present

## 2011-12-29 DIAGNOSIS — D649 Anemia, unspecified: Secondary | ICD-10-CM | POA: Diagnosis not present

## 2011-12-29 DIAGNOSIS — R109 Unspecified abdominal pain: Secondary | ICD-10-CM | POA: Diagnosis present

## 2011-12-29 DIAGNOSIS — IMO0002 Reserved for concepts with insufficient information to code with codable children: Secondary | ICD-10-CM | POA: Diagnosis present

## 2011-12-29 DIAGNOSIS — T50995A Adverse effect of other drugs, medicaments and biological substances, initial encounter: Secondary | ICD-10-CM | POA: Diagnosis present

## 2011-12-29 DIAGNOSIS — R609 Edema, unspecified: Secondary | ICD-10-CM | POA: Diagnosis not present

## 2011-12-29 DIAGNOSIS — R509 Fever, unspecified: Secondary | ICD-10-CM | POA: Diagnosis not present

## 2011-12-29 DIAGNOSIS — F10939 Alcohol use, unspecified with withdrawal, unspecified: Secondary | ICD-10-CM | POA: Diagnosis not present

## 2011-12-29 DIAGNOSIS — R062 Wheezing: Secondary | ICD-10-CM | POA: Diagnosis not present

## 2011-12-29 DIAGNOSIS — A419 Sepsis, unspecified organism: Secondary | ICD-10-CM | POA: Diagnosis not present

## 2011-12-29 DIAGNOSIS — F1020 Alcohol dependence, uncomplicated: Secondary | ICD-10-CM | POA: Diagnosis present

## 2011-12-29 DIAGNOSIS — R142 Eructation: Secondary | ICD-10-CM | POA: Diagnosis not present

## 2011-12-29 DIAGNOSIS — N390 Urinary tract infection, site not specified: Secondary | ICD-10-CM | POA: Diagnosis present

## 2011-12-29 DIAGNOSIS — K861 Other chronic pancreatitis: Secondary | ICD-10-CM | POA: Diagnosis present

## 2011-12-29 DIAGNOSIS — E875 Hyperkalemia: Secondary | ICD-10-CM | POA: Diagnosis not present

## 2011-12-29 DIAGNOSIS — R600 Localized edema: Secondary | ICD-10-CM | POA: Diagnosis not present

## 2011-12-29 DIAGNOSIS — E8809 Other disorders of plasma-protein metabolism, not elsewhere classified: Secondary | ICD-10-CM | POA: Diagnosis present

## 2011-12-29 DIAGNOSIS — R58 Hemorrhage, not elsewhere classified: Secondary | ICD-10-CM | POA: Diagnosis present

## 2011-12-29 DIAGNOSIS — D6489 Other specified anemias: Secondary | ICD-10-CM | POA: Diagnosis present

## 2011-12-29 DIAGNOSIS — E872 Acidosis, unspecified: Secondary | ICD-10-CM | POA: Diagnosis not present

## 2011-12-29 DIAGNOSIS — M6282 Rhabdomyolysis: Secondary | ICD-10-CM | POA: Diagnosis present

## 2011-12-29 DIAGNOSIS — R141 Gas pain: Secondary | ICD-10-CM | POA: Diagnosis not present

## 2011-12-29 DIAGNOSIS — N9989 Other postprocedural complications and disorders of genitourinary system: Secondary | ICD-10-CM | POA: Diagnosis present

## 2011-12-29 DIAGNOSIS — E871 Hypo-osmolality and hyponatremia: Secondary | ICD-10-CM | POA: Diagnosis not present

## 2011-12-29 DIAGNOSIS — K567 Ileus, unspecified: Secondary | ICD-10-CM | POA: Diagnosis present

## 2011-12-29 HISTORY — DX: Ileus, unspecified: K56.7

## 2011-12-29 HISTORY — DX: Hemorrhage, not elsewhere classified: R58

## 2011-12-29 LAB — COMPREHENSIVE METABOLIC PANEL
AST: 170 U/L — ABNORMAL HIGH (ref 0–37)
Albumin: 3.3 g/dL — ABNORMAL LOW (ref 3.5–5.2)
BUN: 17 mg/dL (ref 6–23)
Chloride: 100 mEq/L (ref 96–112)
Creatinine, Ser: 1.69 mg/dL — ABNORMAL HIGH (ref 0.50–1.35)
Total Protein: 7.3 g/dL (ref 6.0–8.3)

## 2011-12-29 LAB — DIFFERENTIAL
Basophils Absolute: 0 10*3/uL (ref 0.0–0.1)
Eosinophils Absolute: 0 10*3/uL (ref 0.0–0.7)
Lymphocytes Relative: 29 % (ref 12–46)
Lymphs Abs: 2 10*3/uL (ref 0.7–4.0)
Monocytes Relative: 9 % (ref 3–12)
Neutro Abs: 4.3 10*3/uL (ref 1.7–7.7)

## 2011-12-29 LAB — CBC
HCT: 40.9 % (ref 39.0–52.0)
MCHC: 37.7 g/dL — ABNORMAL HIGH (ref 30.0–36.0)
MCV: 84 fL (ref 78.0–100.0)
Platelets: 205 10*3/uL (ref 150–400)
RDW: 14.2 % (ref 11.5–15.5)
WBC: 6.9 10*3/uL (ref 4.0–10.5)

## 2011-12-29 LAB — LIPASE, BLOOD: Lipase: 1273 U/L — ABNORMAL HIGH (ref 11–59)

## 2011-12-29 MED ORDER — HYDROMORPHONE HCL PF 1 MG/ML IJ SOLN
1.0000 mg | Freq: Once | INTRAMUSCULAR | Status: AC
  Administered 2011-12-29: 1 mg via INTRAVENOUS
  Filled 2011-12-29: qty 1

## 2011-12-29 MED ORDER — IOHEXOL 300 MG/ML  SOLN
100.0000 mL | Freq: Once | INTRAMUSCULAR | Status: AC | PRN
  Administered 2011-12-29: 100 mL via INTRAVENOUS

## 2011-12-29 MED ORDER — SODIUM CHLORIDE 0.9 % IV BOLUS (SEPSIS)
1000.0000 mL | Freq: Once | INTRAVENOUS | Status: AC
  Administered 2011-12-29: 1000 mL via INTRAVENOUS

## 2011-12-29 MED ORDER — LORAZEPAM 2 MG/ML IJ SOLN: 0.0000 mg | Freq: Two times a day (BID) | INTRAMUSCULAR | Status: DC

## 2011-12-29 MED ORDER — ACETAMINOPHEN 325 MG PO TABS
650.0000 mg | ORAL_TABLET | Freq: Four times a day (QID) | ORAL | Status: DC | PRN
  Administered 2011-12-30: 650 mg via ORAL
  Filled 2011-12-29: qty 2

## 2011-12-29 MED ORDER — SODIUM CHLORIDE 0.9 % IV SOLN
INTRAVENOUS | Status: AC
  Administered 2011-12-29 – 2011-12-30 (×4): via INTRAVENOUS
  Administered 2011-12-31: 200 mL/h via INTRAVENOUS
  Administered 2011-12-31 – 2012-01-01 (×2): via INTRAVENOUS
  Administered 2012-01-01: 75 mL via INTRAVENOUS
  Administered 2012-01-03 – 2012-01-04 (×2): via INTRAVENOUS

## 2011-12-29 MED ORDER — LORAZEPAM 0.5 MG PO TABS: 1.0000 mg | ORAL_TABLET | Freq: Four times a day (QID) | ORAL | Status: DC | PRN

## 2011-12-29 MED ORDER — ONDANSETRON HCL 4 MG/2ML IJ SOLN
INTRAMUSCULAR | Status: AC
  Filled 2011-12-29: qty 2

## 2011-12-29 MED ORDER — IOHEXOL 300 MG/ML  SOLN
40.0000 mL | Freq: Once | INTRAMUSCULAR | Status: AC | PRN
  Administered 2011-12-29: 40 mL via ORAL

## 2011-12-29 MED ORDER — FENTANYL CITRATE 0.05 MG/ML IJ SOLN
100.0000 ug | Freq: Once | INTRAMUSCULAR | Status: AC
  Administered 2011-12-29: 100 ug via INTRAVENOUS
  Filled 2011-12-29: qty 2

## 2011-12-29 MED ORDER — FAMOTIDINE IN NACL 20-0.9 MG/50ML-% IV SOLN
20.0000 mg | Freq: Once | INTRAVENOUS | Status: AC
  Administered 2011-12-29: 20 mg via INTRAVENOUS
  Filled 2011-12-29: qty 50

## 2011-12-29 MED ORDER — HYDROMORPHONE HCL PF 1 MG/ML IJ SOLN
1.0000 mg | INTRAMUSCULAR | Status: DC | PRN
  Administered 2011-12-29 (×2): 2 mg via INTRAVENOUS
  Administered 2011-12-29 (×2): 1 mg via INTRAVENOUS
  Administered 2011-12-29: 2 mg via INTRAVENOUS
  Administered 2011-12-30 (×3): 1 mg via INTRAVENOUS
  Administered 2011-12-30: 2 mg via INTRAVENOUS
  Administered 2011-12-31: 1 mg via INTRAVENOUS
  Administered 2011-12-31: 2 mg via INTRAVENOUS
  Administered 2011-12-31 (×2): 1 mg via INTRAVENOUS
  Administered 2012-01-01: 2 mg via INTRAVENOUS
  Administered 2012-01-01: 1 mg via INTRAVENOUS
  Administered 2012-01-01 (×2): 2 mg via INTRAVENOUS
  Administered 2012-01-01: 1 mg via INTRAVENOUS
  Administered 2012-01-01 – 2012-01-03 (×8): 2 mg via INTRAVENOUS
  Filled 2011-12-29: qty 1
  Filled 2011-12-29 (×2): qty 2
  Filled 2011-12-29: qty 1
  Filled 2011-12-29 (×6): qty 2
  Filled 2011-12-29: qty 1
  Filled 2011-12-29: qty 2
  Filled 2011-12-29: qty 1
  Filled 2011-12-29 (×2): qty 2
  Filled 2011-12-29: qty 1
  Filled 2011-12-29 (×3): qty 2
  Filled 2011-12-29: qty 1
  Filled 2011-12-29: qty 2
  Filled 2011-12-29: qty 1
  Filled 2011-12-29: qty 2
  Filled 2011-12-29: qty 1
  Filled 2011-12-29: qty 2
  Filled 2011-12-29: qty 1

## 2011-12-29 MED ORDER — ACETAMINOPHEN 650 MG RE SUPP: 650.0000 mg | Freq: Four times a day (QID) | RECTAL | Status: DC | PRN

## 2011-12-29 MED ORDER — ONDANSETRON HCL 4 MG/2ML IJ SOLN
4.0000 mg | Freq: Once | INTRAMUSCULAR | Status: AC
  Administered 2011-12-29: 4 mg via INTRAVENOUS
  Filled 2011-12-29: qty 2

## 2011-12-29 MED ORDER — THIAMINE HCL 100 MG/ML IJ SOLN
100.0000 mg | Freq: Every day | INTRAMUSCULAR | Status: DC
  Administered 2011-12-29 – 2012-01-01 (×4): 100 mg via INTRAVENOUS
  Filled 2011-12-29 (×4): qty 2

## 2011-12-29 MED ORDER — LORAZEPAM 2 MG/ML IJ SOLN
1.0000 mg | Freq: Four times a day (QID) | INTRAMUSCULAR | Status: DC | PRN
  Administered 2011-12-30: 1 mg via INTRAVENOUS
  Filled 2011-12-29: qty 1

## 2011-12-29 MED ORDER — SODIUM CHLORIDE 0.9 % IV SOLN
INTRAVENOUS | Status: DC
  Administered 2011-12-29 (×3): via INTRAVENOUS

## 2011-12-29 MED ORDER — ONDANSETRON HCL 4 MG/2ML IJ SOLN
4.0000 mg | Freq: Once | INTRAMUSCULAR | Status: AC
  Administered 2011-12-29: 4 mg via INTRAVENOUS

## 2011-12-29 MED ORDER — ALUM & MAG HYDROXIDE-SIMETH 200-200-20 MG/5ML PO SUSP: 30.0000 mL | Freq: Four times a day (QID) | ORAL | Status: DC | PRN

## 2011-12-29 MED ORDER — SODIUM CHLORIDE 0.9 % IV BOLUS (SEPSIS)
500.0000 mL | Freq: Once | INTRAVENOUS | Status: AC
  Administered 2011-12-29: 500 mL via INTRAVENOUS

## 2011-12-29 MED ORDER — LORAZEPAM 2 MG/ML IJ SOLN
0.0000 mg | Freq: Four times a day (QID) | INTRAMUSCULAR | Status: DC
  Administered 2011-12-29 – 2011-12-30 (×3): 2 mg via INTRAVENOUS
  Filled 2011-12-29 (×3): qty 1

## 2011-12-29 NOTE — ED Notes (Signed)
Assumed care of pt. Report received from [facility]. Pt appears asleep at this time. Resp even and unlabored. NAD at this time. IV site appears benign. No swelling, bleeding or redness noted at this time. Pt lying supine in stretcher. Stretcher in low locked position. Side rail up for pt safety. Call light within reach. Awaiting further orders. Will continue to monitor. Will continue to monitor.

## 2011-12-29 NOTE — H&P (Addendum)
Hospital Admission Note Date: 12/29/2011  Patient name: Peter [name] Medical record number: [id number] Date of birth: [birth year] Age: 41 y.o. Gender: male PCP: Colette Ribas, MD, MD  Attending physician: Christiane Ha, MD  Chief Complaint: Abdominal pain, vomiting.  History of Present Illness:  Peter [name] is an 41 y.o. male with sudden onset vomiting and left-sided abdominal pain. He was found to have a lipase of 1200 in the emergency room. CT scan of the abdomen shows pancreatitis. Previous dictations mention heavy alcohol. Patient admits to drinking heavily recently in celebration of his birthday. He is vague about how much he drinks. Triglyceride level in January of this year was 300. Patient has had no hematemesis or melena. Liver function tests are slightly elevated, but bilirubin and alkaline phosphatase are not. CT scan of the abdomen also shows hepatic steatosis.  Past Medical History  Diagnosis Date  . Hypertension     Meds: No prescriptions prior to admission   previously on lisinopril, but patient reports he had an allergic reaction consisting of lip swelling so he stopped this and never followed up with his primary care physician.  Allergies: Ace inhibitors History   Social History  . Marital Status: Single    Spouse Name: N/A    Number of Children: N/A  . Years of Education: N/A   Occupational History  . Not on file.   Social History Main Topics  . Smoking status: Never Smoker   . Smokeless tobacco: Not on file  . Alcohol Use: Yes  . Drug Use: No  . Sexually Active: Yes   Other Topics Concern  . Not on file   Social History Narrative  . No narrative on file   History reviewed. No pertinent family history. Past Surgical History  Procedure Date  . Hernia repair     Review of Systems: Systems reviewed and as per HPI, otherwise negative.  Physical Exam: Blood pressure 116/78, pulse 90, temperature 98.4 F (36.9 C), temperature  source Oral, resp. rate 18, height 5\' 10"  (1.778 m), weight 104.4 kg (230 lb 2.6 oz), SpO2 92.00%. BP 116/78  Pulse 90  Temp(Src) 98.4 F (36.9 C) (Oral)  Resp 18  Ht 5\' 10"  (1.778 m)  Wt 104.4 kg (230 lb 2.6 oz)  BMI 33.02 kg/m2  SpO2 92%  General Appearance:    Alert, cooperative, uncomfortable, appears stated age  Head:    Normocephalic, without obvious abnormality, atraumatic  Eyes:    PERRL, conjunctiva/corneas clear, EOM's intact, fundi    benign, both eyes       Ears:    Normal TM's and external ear canals, both ears  Nose:   Nares normal, septum midline, mucosa normal, no drainage    or sinus tenderness  Throat:   dry mucous membranes. No thrush.   Neck:   Supple, symmetrical, trachea midline, no adenopathy;       thyroid:  No enlargement/tenderness/nodules; no carotid   bruit or JVD  Back:     Symmetric, no curvature, ROM normal, no CVA tenderness  Lungs:     Clear to auscultation bilaterally, respirations unlabored  Chest wall:    No tenderness or deformity  Heart:    Regular rate and rhythm, S1 and S2 normal, no murmur, rub   or gallop  Abdomen:     Soft, obese. Diffusely tender but particularly in the upper abdomen. Normal bowel sounds.   Genitalia:   deferred  Rectal:   deferred   Extremities:  Extremities normal, atraumatic, no cyanosis or edema  Pulses:   2+ and symmetric all extremities  Skin:   Skin color, texture, turgor normal, no rashes or lesions  Lymph nodes:   Cervical, supraclavicular, and axillary nodes normal  Neurologic:   CNII-XII intact. Normal strength, sensation and reflexes      throughout    Lab results: Basic Metabolic Panel:  Basename 12/29/11 0436  NA 138  K 4.1  CL 100  CO2 27  GLUCOSE 131*  BUN 17  CREATININE 1.69*  CALCIUM 8.5  MG --  PHOS --   Liver Function Tests:  [facility] 12/29/11 0436  AST 170*  ALT 108*  ALKPHOS 75  BILITOT 0.6  PROT 7.3  ALBUMIN 3.3*    Basename 12/29/11 0436  LIPASE 1273*  AMYLASE --    No results found for this basename: AMMONIA:2 in the last 72 hours CBC:  Basename 12/29/11 0436  WBC 6.9  NEUTROABS 4.3  HGB 15.4  HCT 40.9  MCV 84.0  PLT 205   Cardiac Enzymes: No results found for this basename: CKTOTAL:3,CKMB:3,CKMBINDEX:3,TROPONINI:3 in the last 72 hours BNP: No results found for this basename: PROBNP:3 in the last 72 hours D-Dimer: No results found for this basename: DDIMER:2 in the last 72 hours CBG: No results found for this basename: GLUCAP:6 in the last 72 hours Hemoglobin A1C: No results found for this basename: HGBA1C in the last 72 hours Fasting Lipid Panel: No results found for this basename: CHOL,HDL,LDLCALC,TRIG,CHOLHDL,LDLDIRECT in the last 72 hours Thyroid Function Tests: No results found for this basename: TSH,T4TOTAL,FREET4,T3FREE,THYROIDAB in the last 72 hours Anemia Panel: No results found for this basename: VITAMINB12,FOLATE,FERRITIN,TIBC,IRON,RETICCTPCT in the last 72 hours Coagulation: No results found for this basename: LABPROT:2,INR:2 in the last 72 hours Urine Drug Screen: Drugs of Abuse    Imaging results:  Ct Abdomen Pelvis W Contrast  12/29/2011  *RADIOLOGY REPORT*  Clinical Data: Abdominal pain, elevated LFTs and lipase, no history of pancreatitis  CT ABDOMEN AND PELVIS WITH CONTRAST  Technique:  Multidetector CT imaging of the abdomen and pelvis was performed following the standard protocol during bolus administration of intravenous contrast.  Contrast:  80 ml Omnipaque-300  Comparison: CT abdomen pelvis - 04/26/2007  Findings:  Normal hepatic contour.  There is diffuse decreased attenuation of the hepatic parenchyma on this postcontrast examination, there is suggestion of hepatic steatosis.  No discrete hepatic lesions. Normal gallbladder.  No intra or extrahepatic biliary ductal dilatation.  There is symmetric enhancement and screw through the bilateral kidneys.  No discrete renal lesions.  No urinary obstruction.  The bilateral  adrenal glands are normal.  Normal pancreas.  Their pancreas is thickened with associated extensive stranding and fluid about the pancreas.  There is no definitive evidence of pancreatic ductal dilatation.  The pancreas enhances normally.  No drainable peripancreatic fluid collection or abscess.  Scattered colonic diverticulosis without evidence of diverticulitis.  Enteric contrast extends to the distal small bowel.  No evidence of enteric obstruction.  Normal appendix.  No pneumoperitoneum, pneumatosis or portal venous gas.  Normal caliber of the abdominal aorta.  The major branch vessels of the abdominal aorta, including the IMA, are patent.  The splenic artery and vein are patent.  Limited visualization of the lung bases demonstrates bibasilar ground-glass opacities favored to represent atelectasis.  No pneumothorax.  Normal heart size.  No pericardial effusion. Minimal calcifications within the aortic valve annulus.  No acute or aggressive osseous abnormalities.  Minimal degenerative change of L5 - S1.  IMPRESSION:  1.  Findings compatible with acute pancreatitis.  No definitive areas of pancreatic necrosis.  No drainable peripancreatic fluid collection or abscess.  2. Hepatic steatosis.  No intra or extrahepatic biliary ductal dilatation.  Original Report Authenticated By: Waynard Reeds, M.D.    Assessment & Plan: Principal Problem:  *Acute pancreatitis Active Problems:  Alcohol abuse  HTN (hypertension)  Hepatic steatosis  acute renal insufficiency secondary to dehydration  Patient will get IV fluids, antiemetics as needed, pain medication as needed, bowel rest. Needs to quit drinking. Alcohol withdrawal protocol. Recheck triglyceride level. Monitor liver function tests, electrolytes, lipase. Blood pressure is not currently high.  Tyreck Bell L 12/29/2011, 12:07 PM

## 2011-12-29 NOTE — ED Provider Notes (Signed)
Received pt in signout to f/u on imaging CT shows pancreatitis without focal abscess/necrosis He still has pain/vomiting Pt admitted, d/w dr Lendell Caprice admit to medical floor  Joya Gaskins, MD 12/29/11 [phone]

## 2011-12-29 NOTE — ED Provider Notes (Addendum)
History     CSN: [id number]  Arrival date & time 12/29/11  [id number]   First MD Initiated Contact with Patient 12/29/11 0434      Chief Complaint  Patient presents with  . Abdominal Pain  . Emesis  . Diarrhea    (Consider location/radiation/quality/duration/timing/severity/associated sxs/prior treatment) HPI Comments: The patient is an otherwise healthy 41 year old male who was feeling well yesterday and approximately 2-3 hours ago developed the onset of nausea and vomiting with subsequent generalized abdominal cramping discomfort and bloating, and multiple episodes of watery brown diarrhea without blood or mucus.  Patient is a 41 y.o. male presenting with vomiting. The history is provided by the patient.  Emesis  This is a new problem. The current episode started 3 to 5 hours ago. The problem occurs 2 to 4 times per day. The problem has not changed since onset.The emesis has an appearance of stomach contents. There has been no fever. Associated symptoms include abdominal pain, chills, diarrhea and sweats. Pertinent negatives include no arthralgias, no cough, no fever, no headaches, no myalgias and no URI.    Past Medical History  Diagnosis Date  . Hypertension     Past Surgical History  Procedure Date  . Hernia repair     History reviewed. No pertinent family history.  History  Substance Use Topics  . Smoking status: Never Smoker   . Smokeless tobacco: Not on file  . Alcohol Use: Yes      Review of Systems  Unable to perform ROS Constitutional: Positive for chills and appetite change. Negative for fever, diaphoresis, activity change, fatigue and unexpected weight change.  HENT: Negative for ear pain, congestion, sore throat, rhinorrhea, mouth sores, trouble swallowing, neck pain, neck stiffness and postnasal drip.   Eyes: Negative.   Respiratory: Negative for cough, chest tightness, shortness of breath and wheezing.   Cardiovascular: Negative for chest pain,  palpitations and leg swelling.  Gastrointestinal: Positive for nausea, vomiting, abdominal pain and diarrhea. Negative for constipation, blood in stool, abdominal distention, anal bleeding and rectal pain.  Genitourinary: Negative for dysuria, urgency, frequency, hematuria and flank pain.  Musculoskeletal: Negative for myalgias, back pain and arthralgias.  Skin: Negative for color change, pallor, rash and wound.  Neurological: Negative for dizziness, syncope, weakness, light-headedness and headaches.  Hematological: Negative for adenopathy.  Psychiatric/Behavioral: Negative.     Allergies  Review of patient's allergies indicates no known allergies.  Home Medications   Current Outpatient Rx  Name Route Sig Dispense Refill  . ACETAMINOPHEN 500 MG PO TABS Oral Take 1,000 mg by mouth every 6 (six) hours as needed. For pain     . DIPHENHYDRAMINE HCL (SLEEP) 25 MG PO TABS Oral Take 50 mg by mouth as needed. For allergic reaction    . FOLIC ACID 1 MG PO TABS Oral Take 1 tablet (1 mg total) by mouth daily. 30 tablet 1  . IBUPROFEN 200 MG PO TABS Oral Take 400 mg by mouth every 6 (six) hours as needed. For pain     . NITROGLYCERIN 0.4 MG SL SUBL Sublingual Place 1 tablet (0.4 mg total) under the tongue every 5 (five) minutes as needed for chest pain. 30 tablet 1  . OMEPRAZOLE MAGNESIUM 20 MG PO TBEC Oral Take 1 tablet (20 mg total) by mouth daily. 30 tablet 1  . LISINOPRIL 10 MG PO TABS Oral Take 1 tablet (10 mg total) by mouth daily. 30 tablet 1  . PREDNISONE 10 MG PO TABS Oral Take 1 tablet (10  mg total) by mouth 2 (two) times daily. 10 tablet 0  . THIAMINE HCL 100 MG PO TABS Oral Take 1 tablet (100 mg total) by mouth daily. 30 tablet 1    BP 100/64  Pulse 89  Temp(Src) 97.3 F (36.3 C) (Oral)  Resp 20  Ht 5\' 10"  (1.778 m)  Wt 216 lb (97.977 kg)  BMI 30.99 kg/m2  SpO2 97%  Physical Exam  Nursing note and vitals reviewed. Constitutional: He is oriented to person, place, and time. He  appears well-developed and well-nourished. He is active.  Non-toxic appearance. He does not have a sickly appearance. He does not appear ill. He appears distressed.  HENT:  Head: Normocephalic and atraumatic.  Right Ear: Hearing, tympanic membrane, external ear and ear canal normal.  Left Ear: Hearing, tympanic membrane, external ear and ear canal normal.  Nose: Nose normal. No mucosal edema.  Mouth/Throat: Uvula is midline and oropharynx is clear and moist. Mucous membranes are dry. No oral lesions. No uvula swelling. No oropharyngeal exudate, posterior oropharyngeal edema, posterior oropharyngeal erythema or tonsillar abscesses.  Eyes: Conjunctivae and EOM are normal. Pupils are equal, round, and reactive to light. Right eye exhibits no chemosis, no discharge and no exudate. Left eye exhibits no chemosis, no discharge and no exudate. Right conjunctiva is not injected. Left conjunctiva is not injected. No scleral icterus.  Neck: Normal range of motion, full passive range of motion without pain and phonation normal. Neck supple. No rigidity. No Brudzinski's sign noted.  Cardiovascular: Normal rate, regular rhythm, intact distal pulses and normal pulses.   No extrasystoles are present.  Pulmonary/Chest: Effort normal and breath sounds normal. No accessory muscle usage. Not tachypneic. No respiratory distress. He has no decreased breath sounds. He has no wheezes. He has no rhonchi. He has no rales. He exhibits no tenderness, no crepitus and no retraction.  Abdominal: Soft. Normal appearance. He exhibits no shifting dullness, no distension, no pulsatile liver, no fluid wave, no abdominal bruit, no ascites, no pulsatile midline mass and no mass. Bowel sounds are increased. There is no hepatosplenomegaly. There is generalized tenderness. There is no rigidity, no rebound, no guarding and no CVA tenderness. No hernia.  Musculoskeletal: Normal range of motion.  Neurological: He is alert and oriented to person,  place, and time. He has normal strength and normal reflexes. He is not disoriented. No cranial nerve deficit. Coordination normal. GCS eye subscore is 4. GCS verbal subscore is 5. GCS motor subscore is 6.  Skin: Skin is warm, dry and intact. No bruising, no ecchymosis, no lesion and no rash noted. He is not diaphoretic. No erythema. No pallor.  Psychiatric: He has a normal mood and affect. His speech is normal and behavior is normal. Judgment and thought content normal. Cognition and memory are normal.    ED Course  Procedures (including critical care time)   Labs Reviewed  CBC  DIFFERENTIAL  COMPREHENSIVE METABOLIC PANEL  LIPASE, BLOOD   No results found.   No diagnosis found.    MDM  viral gastroenteritis, gastritis, peptic ulcer disease, biliary colic, cholelithiasis, cholecystitis, cholangitis, hepatitis, renal colic, urinary tract infection, colitis all considered among other etiologies in the patient's differential diagnosis.   My clinical impression strongly suggests a viral gastroenteritis as the cause of the patient's symptoms.      Felisa Bonier, MD 12/29/11 0531  6:22 AM At this time, the patient's pain is controlled and he is having no further nausea or vomiting. His laboratory studies came  back to show elevated lipase and elevated liver transaminase without elevated bilirubin or alkaline phosphatase and no leukocytosis. I'm concerned that the patient may have pancreatitis. With the alkaline phosphatase not elevated, choledocholithiasis and obstruction by gallstones are less likely, and with no fever or leukocytosis, cholecystitis is also less likely, however an etiology for her new-onset pancreatitis in this patient is not known. I've ordered a CT scan to evaluate further. The patient has been updated on the findings and plan of care.  Felisa Bonier, MD 12/29/11 [phone]

## 2011-12-29 NOTE — ED Notes (Signed)
Reported abdominal pain, n/v/d starting tonight.

## 2011-12-30 ENCOUNTER — Inpatient Hospital Stay (HOSPITAL_COMMUNITY): Payer: Self-pay

## 2011-12-30 ENCOUNTER — Other Ambulatory Visit: Payer: Self-pay

## 2011-12-30 DIAGNOSIS — R509 Fever, unspecified: Secondary | ICD-10-CM | POA: Diagnosis not present

## 2011-12-30 DIAGNOSIS — E781 Pure hyperglyceridemia: Secondary | ICD-10-CM | POA: Diagnosis present

## 2011-12-30 DIAGNOSIS — F10239 Alcohol dependence with withdrawal, unspecified: Secondary | ICD-10-CM | POA: Diagnosis not present

## 2011-12-30 DIAGNOSIS — N179 Acute kidney failure, unspecified: Secondary | ICD-10-CM | POA: Diagnosis present

## 2011-12-30 DIAGNOSIS — E872 Acidosis, unspecified: Secondary | ICD-10-CM | POA: Diagnosis not present

## 2011-12-30 DIAGNOSIS — F10939 Alcohol use, unspecified with withdrawal, unspecified: Secondary | ICD-10-CM | POA: Diagnosis not present

## 2011-12-30 DIAGNOSIS — E875 Hyperkalemia: Secondary | ICD-10-CM | POA: Diagnosis not present

## 2011-12-30 LAB — COMPREHENSIVE METABOLIC PANEL
Alkaline Phosphatase: 77 U/L (ref 39–117)
BUN: 25 mg/dL — ABNORMAL HIGH (ref 6–23)
CO2: 16 mEq/L — ABNORMAL LOW (ref 19–32)
Chloride: 101 mEq/L (ref 96–112)
Creatinine, Ser: 3.58 mg/dL — ABNORMAL HIGH (ref 0.50–1.35)
GFR calc non Af Amer: 19 mL/min — ABNORMAL LOW (ref >90)
Total Bilirubin: 1.3 mg/dL — ABNORMAL HIGH (ref 0.3–1.2)

## 2011-12-30 LAB — BASIC METABOLIC PANEL
BUN: 29 mg/dL — ABNORMAL HIGH (ref 6–23)
CO2: 16 mEq/L — ABNORMAL LOW (ref 19–32)
CO2: 18 mEq/L — ABNORMAL LOW (ref 19–32)
Calcium: 5.4 mg/dL — CL (ref 8.4–10.5)
Calcium: 4.8 mg/dL — CL (ref 8.4–10.5)
Chloride: 104 mEq/L (ref 96–112)
Creatinine, Ser: 4.05 mg/dL — ABNORMAL HIGH (ref 0.50–1.35)
Creatinine, Ser: 3.87 mg/dL — ABNORMAL HIGH (ref 0.50–1.35)
GFR calc Af Amer: 19 mL/min — ABNORMAL LOW (ref >90)
GFR calc Af Amer: 17 mL/min — ABNORMAL LOW (ref >90)
GFR calc non Af Amer: 17 mL/min — ABNORMAL LOW (ref >90)
Glucose, Bld: 243 mg/dL — ABNORMAL HIGH (ref 70–99)
Potassium: 4.9 mEq/L (ref 3.5–5.1)
Sodium: 134 mEq/L — ABNORMAL LOW (ref 135–145)
Sodium: 133 mEq/L — ABNORMAL LOW (ref 135–145)

## 2011-12-30 LAB — URINALYSIS, ROUTINE W REFLEX MICROSCOPIC
Glucose, UA: NEGATIVE mg/dL
Leukocytes, UA: NEGATIVE
Nitrite: NEGATIVE
Protein, ur: NEGATIVE mg/dL

## 2011-12-30 LAB — CARDIAC PANEL(CRET KIN+CKTOT+MB+TROPI)
CK, MB: 8.3 ng/mL (ref 0.3–4.0)
Relative Index: 0.5 (ref 0.0–2.5)
Total CK: 1669 U/L — ABNORMAL HIGH (ref 7–232)
Troponin I: <0.3 ng/mL (ref <0.30)
Troponin I: <0.3 ng/mL (ref <0.30)

## 2011-12-30 LAB — RAPID URINE DRUG SCREEN, HOSP PERFORMED
Barbiturates: NOT DETECTED
Benzodiazepines: NOT DETECTED

## 2011-12-30 LAB — PROTIME-INR
INR: 1.13 (ref 0.00–1.49)
Prothrombin Time: 14.7 seconds (ref 11.6–15.2)

## 2011-12-30 LAB — GLUCOSE, CAPILLARY: Glucose-Capillary: 222 mg/dL — ABNORMAL HIGH (ref 70–99)

## 2011-12-30 LAB — MAGNESIUM: Magnesium: 1.3 mg/dL — ABNORMAL LOW (ref 1.5–2.5)

## 2011-12-30 LAB — LACTIC ACID, PLASMA: Lactic Acid, Venous: 3.7 mmol/L — ABNORMAL HIGH (ref 0.5–2.2)

## 2011-12-30 LAB — MRSA PCR SCREENING: MRSA by PCR: NEGATIVE

## 2011-12-30 LAB — CK: Total CK: 1439 U/L — ABNORMAL HIGH (ref 7–232)

## 2011-12-30 MED ORDER — CALCIUM GLUCONATE 10 % IV SOLN
1.0000 g | Freq: Once | INTRAVENOUS | Status: AC
  Administered 2011-12-30: 1 g via INTRAVENOUS
  Filled 2011-12-30 (×2): qty 10

## 2011-12-30 MED ORDER — SODIUM CHLORIDE 0.9 % IV BOLUS (SEPSIS)
500.0000 mL | Freq: Once | INTRAVENOUS | Status: AC
  Administered 2011-12-30: 500 mL via INTRAVENOUS

## 2011-12-30 MED ORDER — SODIUM POLYSTYRENE SULFONATE 15 GM/60ML PO SUSP
30.0000 g | Freq: Once | ORAL | Status: DC
  Filled 2011-12-30: qty 120

## 2011-12-30 MED ORDER — INSULIN REGULAR HUMAN 100 UNIT/ML IJ SOLN
6.0000 [IU] | Freq: Once | INTRAMUSCULAR | Status: DC
Start: 2011-12-30 — End: 2011-12-30
  Filled 2011-12-30: qty 0.06

## 2011-12-30 MED ORDER — PANTOPRAZOLE SODIUM 40 MG IV SOLR
40.0000 mg | INTRAVENOUS | Status: DC
  Administered 2011-12-30 – 2012-01-01 (×3): 40 mg via INTRAVENOUS
  Filled 2011-12-30 (×3): qty 40

## 2011-12-30 MED ORDER — CALCIUM GLUCONATE 10 % IV SOLN: 1.0000 g | Freq: Once | INTRAVENOUS | Status: DC

## 2011-12-30 MED ORDER — DEXTROSE 50 % IV SOLN
INTRAVENOUS | Status: AC
  Administered 2011-12-30: 50 mL via INTRAVENOUS
  Filled 2011-12-30: qty 50

## 2011-12-30 MED ORDER — INSULIN ASPART 100 UNIT/ML ~~LOC~~ SOLN
6.0000 [IU] | Freq: Once | SUBCUTANEOUS | Status: AC
  Administered 2011-12-30: 6 [IU] via INTRAVENOUS

## 2011-12-30 MED ORDER — SODIUM CHLORIDE 0.9 % IJ SOLN
INTRAMUSCULAR | Status: AC
  Administered 2011-12-30: 19:00:00
  Filled 2011-12-30: qty 3

## 2011-12-30 MED ORDER — IMIPENEM-CILASTATIN 250 MG IV SOLR
250.0000 mg | Freq: Four times a day (QID) | INTRAVENOUS | Status: DC
  Administered 2011-12-30 – 2012-01-02 (×11): 250 mg via INTRAVENOUS
  Filled 2011-12-30 (×12): qty 250

## 2011-12-30 MED ORDER — ONDANSETRON HCL 4 MG/2ML IJ SOLN
4.0000 mg | Freq: Four times a day (QID) | INTRAMUSCULAR | Status: DC | PRN
  Administered 2011-12-30 – 2012-01-11 (×11): 4 mg via INTRAVENOUS
  Filled 2011-12-30 (×12): qty 2

## 2011-12-30 MED ORDER — SODIUM POLYSTYRENE SULFONATE 15 GM/60ML PO SUSP
30.0000 g | Freq: Once | ORAL | Status: AC
  Administered 2011-12-30: 30 g via ORAL
  Filled 2011-12-30: qty 120

## 2011-12-30 MED ORDER — SODIUM BICARBONATE 8.4 % IV SOLN
50.0000 meq | Freq: Once | INTRAVENOUS | Status: AC
  Administered 2011-12-30: 50 meq via INTRAVENOUS
  Filled 2011-12-30: qty 50

## 2011-12-30 MED ORDER — LORAZEPAM 2 MG/ML IJ SOLN
1.0000 mg | INTRAMUSCULAR | Status: DC | PRN
  Administered 2011-12-30: 1 mg via INTRAVENOUS
  Administered 2011-12-30 – 2012-01-02 (×13): 2 mg via INTRAVENOUS
  Administered 2012-01-03: 1 mg via INTRAVENOUS
  Administered 2012-01-04: 2 mg via INTRAVENOUS
  Administered 2012-01-04: 1 mg via INTRAVENOUS
  Administered 2012-01-05 – 2012-01-07 (×6): 2 mg via INTRAVENOUS
  Administered 2012-01-07 – 2012-01-08 (×2): 4 mg via INTRAVENOUS
  Administered 2012-01-09: 2 mg via INTRAVENOUS
  Filled 2011-12-30 (×6): qty 1
  Filled 2011-12-30: qty 2
  Filled 2011-12-30 (×18): qty 1
  Filled 2011-12-30: qty 2

## 2011-12-30 MED ORDER — FUROSEMIDE 10 MG/ML IJ SOLN: 60.0000 mg | Freq: Two times a day (BID) | INTRAMUSCULAR | Status: DC

## 2011-12-30 MED ORDER — SODIUM CHLORIDE 0.9 % IV SOLN
500.0000 mg | Freq: Three times a day (TID) | INTRAVENOUS | Status: DC
  Filled 2011-12-30 (×4): qty 500

## 2011-12-30 MED ORDER — DEXTROSE 50 % IV SOLN
1.0000 | Freq: Once | INTRAVENOUS | Status: AC
  Administered 2011-12-30: 50 mL via INTRAVENOUS

## 2011-12-30 MED ORDER — ACETAMINOPHEN 325 MG PO TABS
650.0000 mg | ORAL_TABLET | ORAL | Status: DC | PRN
  Administered 2011-12-30 – 2012-01-11 (×14): 650 mg via ORAL
  Filled 2011-12-30 (×15): qty 2

## 2011-12-30 MED ORDER — MAGNESIUM SULFATE 40 MG/ML IJ SOLN
2.0000 g | Freq: Once | INTRAMUSCULAR | Status: AC
  Administered 2011-12-30: 2 g via INTRAVENOUS
  Filled 2011-12-30: qty 50

## 2011-12-30 NOTE — Progress Notes (Signed)
ANTIBIOTIC CONSULT NOTE - INITIAL  Pharmacy Consult for Primaxin Indication: sepsis , pancreatitis  Allergies  Allergen Reactions  . Ace Inhibitors Swelling    Lip swelling    Patient Measurements: Height: 5\' 10"  (177.8 cm) Weight: 230 lb 2.6 oz (104.4 kg) IBW/kg (Calculated) : 73    Vital Signs: Temp: 101.3 F (38.5 C) (03/24 1007) Temp src: Axillary (03/24 1007) BP: 120/79 mmHg (03/24 1007) Pulse Rate: 145  (03/24 1115) Intake/Output from previous day: 03/23 0701 - 03/24 0700 In: 3170 [I.V.:3170] Out: -  Intake/Output from this shift:    Labs:  Basename 12/30/11 0824 12/30/11 0640 12/29/11 0436  WBC -- -- 6.9  HGB -- -- 15.4  PLT -- -- 205  LABCREA -- -- --  CREATININE 4.05* 3.58* 1.69*   Estimated Creatinine Clearance: 28.5 ml/min (by C-G formula based on Cr of 4.05). No results found for this basename: VANCOTROUGH:2,VANCOPEAK:2,VANCORANDOM:2,GENTTROUGH:2,GENTPEAK:2,GENTRANDOM:2,TOBRATROUGH:2,TOBRAPEAK:2,TOBRARND:2,AMIKACINPEAK:2,AMIKACINTROU:2,AMIKACIN:2, in the last 72 hours   Microbiology: No results found for this or any previous visit (from the past 720 hour(s)).  Medical History: Past Medical History  Diagnosis Date  . Hypertension     Medications:  Scheduled:    . calcium gluconate  1 g Intravenous Once  . dextrose  1 ampule Intravenous Once  . imipenem-cilastatin  250 mg Intravenous Q6H  . insulin aspart  6 Units Intravenous Once  . LORazepam  0-4 mg Intravenous Q6H   Followed by  . LORazepam  0-4 mg Intravenous Q12H  . sodium bicarbonate  50 mEq Intravenous Once  . sodium polystyrene  30 g Oral Once  . thiamine  100 mg Intravenous Daily  . DISCONTD: calcium gluconate  1 g Intravenous Once  . DISCONTD: imipenem-cilastatin  500 mg Intravenous Q8H  . DISCONTD: insulin regular  6 Units Intravenous Once   Assessment: Reduced renal function CrCl presently 28.5  Goal of Therapy:  Eradicate infection  Plan:  Primaxin 250 mg IV every 6  hours Adjust dose when renal function improves Labs per protocol  Raquel James, Shaneeka Scarboro Bennett 12/30/2011,1:22 PM

## 2011-12-30 NOTE — Consult Note (Signed)
Reason for Consult: Renal failure Referring Physician: Dr. Loman Peter [name] is an 41 y.o. male.  HPI: Patient has history of hypertension and history of asthma her presently had came with complaints of her nausea vomiting after her her consuming alcohol. Her patient says that he has been drinking continuously the last couple of days . Patient denies any previous history of renal failure, kidney stone. Patient denies also any diabetes. According to him he was told she has some sort of heart probably. Presently doesn't take any medication except ACE inhibitor which is thought because of allergic reaction. Since her patient doesn't have any shortness of sign he didn't see any other doctor as outpatient presently.  Past Medical History  Diagnosis Date  . Hypertension     Past Surgical History  Procedure Date  . Hernia repair     History reviewed. No pertinent family history.  Social History:  reports that he has never smoked. He does not have any smokeless tobacco history on file. He reports that he drinks about 4.2 ounces of alcohol per week. He reports that he does not use illicit drugs.  Allergies:  Allergies  Allergen Reactions  . Ace Inhibitors Swelling    Lip swelling    Medications: I have reviewed the patient's current medications.  Results for orders placed during the hospital encounter of 12/29/11 (from the past 48 hour(s))  CBC     Status: Abnormal   Collection Time   12/29/11  4:36 AM      Component Value Range Comment   WBC 6.9  4.0 - 10.5 (K/uL)    RBC 4.87  4.22 - 5.81 (MIL/uL) CORRECTED FOR LIPEMIA   Hemoglobin 15.4  13.0 - 17.0 (g/dL) CORRECTED FOR LIPEMIA   HCT 40.9  39.0 - 52.0 (%) CORRECTED FOR LIPEMIA   MCV 84.0  78.0 - 100.0 (fL) CORRECTED FOR LIPEMIA   MCH 31.6  26.0 - 34.0 (pg) CORRECTED FOR LIPEMIA   MCHC 37.7 (*) 30.0 - 36.0 (g/dL) CORRECTED FOR LIPEMIA   RDW 14.2  11.5 - 15.5 (%) CORRECTED FOR LIPEMIA   Platelets 205  150 - 400 (K/uL)     DIFFERENTIAL     Status: Normal   Collection Time   12/29/11  4:36 AM      Component Value Range Comment   Neutrophils Relative 62  43 - 77 (%)    Lymphocytes Relative 29  12 - 46 (%)    Monocytes Relative 9  3 - 12 (%)    Eosinophils Relative 0  0 - 5 (%)    Basophils Relative 0  0 - 1 (%)    Neutro Abs 4.3  1.7 - 7.7 (K/uL)    Lymphs Abs 2.0  0.7 - 4.0 (K/uL)    Monocytes Absolute 0.6  0.1 - 1.0 (K/uL)    Eosinophils Absolute 0.0  0.0 - 0.7 (K/uL)    Basophils Absolute 0.0  0.0 - 0.1 (K/uL)    WBC Morphology TOXIC GRANULATION   ATYPICAL LYMPHOCYTES  COMPREHENSIVE METABOLIC PANEL     Status: Abnormal   Collection Time   12/29/11  4:36 AM      Component Value Range Comment   Sodium 138  135 - 145 (mEq/L)    Potassium 4.1  3.5 - 5.1 (mEq/L)    Chloride 100  96 - 112 (mEq/L)    CO2 27  19 - 32 (mEq/L)    Glucose, Bld 131 (*) 70 - 99 (mg/dL)  BUN 17  6 - 23 (mg/dL)    Creatinine, Ser 1.61 (*) 0.50 - 1.35 (mg/dL)    Calcium 8.5  8.4 - 10.5 (mg/dL)    Total Protein 7.3  6.0 - 8.3 (g/dL)    Albumin 3.3 (*) 3.5 - 5.2 (g/dL)    AST 096 (*) 0 - 37 (U/L)    ALT 108 (*) 0 - 53 (U/L)    Alkaline Phosphatase 75  39 - 117 (U/L)    Total Bilirubin 0.6  0.3 - 1.2 (mg/dL)    GFR calc non Af Amer 48 (*) >90 (mL/min)    GFR calc Af Amer 56 (*) >90 (mL/min)   LIPASE, BLOOD     Status: Abnormal   Collection Time   12/29/11  4:36 AM      Component Value Range Comment   Lipase 1273 (*) 11 - 59 (U/L) LIPEMIC SPECIMEN  COMPREHENSIVE METABOLIC PANEL     Status: Abnormal   Collection Time   12/30/11  6:40 AM      Component Value Range Comment   Sodium 133 (*) 135 - 145 (mEq/L)    Potassium 6.0 (*) 3.5 - 5.1 (mEq/L) DELTA CHECK NOTED   Chloride 101  96 - 112 (mEq/L)    CO2 16 (*) 19 - 32 (mEq/L)    Glucose, Bld 134 (*) 70 - 99 (mg/dL)    BUN 25 (*) 6 - 23 (mg/dL)    Creatinine, Ser 0.45 (*) 0.50 - 1.35 (mg/dL) DELTA CHECK NOTED   Calcium 5.4 (*) 8.4 - 10.5 (mg/dL)    Total Protein 6.7  6.0 -  8.3 (g/dL)    Albumin 2.7 (*) 3.5 - 5.2 (g/dL)    AST 409 (*) 0 - 37 (U/L)    ALT 82 (*) 0 - 53 (U/L)    Alkaline Phosphatase 77  39 - 117 (U/L)    Total Bilirubin 1.3 (*) 0.3 - 1.2 (mg/dL)    GFR calc non Af Amer 19 (*) >90 (mL/min)    GFR calc Af Amer 22 (*) >90 (mL/min)   PROTIME-INR     Status: Normal   Collection Time   12/30/11  6:40 AM      Component Value Range Comment   Prothrombin Time 14.7  11.6 - 15.2 (seconds)    INR 1.13  0.00 - 1.49    APTT     Status: Normal   Collection Time   12/30/11  6:40 AM      Component Value Range Comment   aPTT 33  24 - 37 (seconds)   LIPASE, BLOOD     Status: Abnormal   Collection Time   12/30/11  6:40 AM      Component Value Range Comment   Lipase 1946 (*) 11 - 59 (U/L) LIPEMIC SPECIMEN  TRIGLYCERIDES     Status: Abnormal   Collection Time   12/30/11  6:40 AM      Component Value Range Comment   Triglycerides 1194 (*) <150 (mg/dL) LIPEMIC SPECIMEN  MAGNESIUM     Status: Abnormal   Collection Time   12/30/11  8:24 AM      Component Value Range Comment   Magnesium 1.3 (*) 1.5 - 2.5 (mg/dL)   PHOSPHORUS     Status: Normal   Collection Time   12/30/11  8:24 AM      Component Value Range Comment   Phosphorus 3.7  2.3 - 4.6 (mg/dL)   CK     Status: Abnormal  Collection Time   12/30/11  8:24 AM      Component Value Range Comment   Total CK 1439 (*) 7 - 232 (U/L)   BASIC METABOLIC PANEL     Status: Abnormal   Collection Time   12/30/11  8:24 AM      Component Value Range Comment   Sodium 134 (*) 135 - 145 (mEq/L)    Potassium 6.5 (*) 3.5 - 5.1 (mEq/L)    Chloride 100  96 - 112 (mEq/L)    CO2 16 (*) 19 - 32 (mEq/L)    Glucose, Bld 141 (*) 70 - 99 (mg/dL)    BUN 26 (*) 6 - 23 (mg/dL)    Creatinine, Ser 1.61 (*) 0.50 - 1.35 (mg/dL)    Calcium 5.4 (*) 8.4 - 10.5 (mg/dL)    GFR calc non Af Amer 17 (*) >90 (mL/min)    GFR calc Af Amer 19 (*) >90 (mL/min)   LACTIC ACID, PLASMA     Status: Abnormal   Collection Time   12/30/11 10:33 AM        Component Value Range Comment   Lactic Acid, Venous 3.7 (*) 0.5 - 2.2 (mmol/L)   PROCALCITONIN     Status: Normal   Collection Time   12/30/11 10:34 AM      Component Value Range Comment   Procalcitonin 19.63     URINALYSIS, ROUTINE W REFLEX MICROSCOPIC     Status: Abnormal   Collection Time   12/30/11 12:13 PM      Component Value Range Comment   Color, Urine YELLOW  YELLOW     APPearance CLEAR  CLEAR     Specific Gravity, Urine >1.030 (*) 1.005 - 1.030     pH 5.5  5.0 - 8.0     Glucose, UA NEGATIVE  NEGATIVE (mg/dL)    Hgb urine dipstick TRACE (*) NEGATIVE     Bilirubin Urine NEGATIVE  NEGATIVE     Ketones, ur NEGATIVE  NEGATIVE (mg/dL)    Protein, ur NEGATIVE  NEGATIVE (mg/dL)    Urobilinogen, UA 0.2  0.0 - 1.0 (mg/dL)    Nitrite NEGATIVE  NEGATIVE     Leukocytes, UA NEGATIVE  NEGATIVE    URINE MICROSCOPIC-ADD ON     Status: Abnormal   Collection Time   12/30/11 12:13 PM      Component Value Range Comment   WBC, UA 7-10  <3 (WBC/hpf)    RBC / HPF 7-10  <3 (RBC/hpf)    Bacteria, UA MANY (*) RARE     Casts GRANULAR CAST (*) NEGATIVE      Ct Abdomen Pelvis W Contrast  12/29/2011  *RADIOLOGY REPORT*  Clinical Data: Abdominal pain, elevated LFTs and lipase, no history of pancreatitis  CT ABDOMEN AND PELVIS WITH CONTRAST  Technique:  Multidetector CT imaging of the abdomen and pelvis was performed following the standard protocol during bolus administration of intravenous contrast.  Contrast:  80 ml Omnipaque-300  Comparison: CT abdomen pelvis - 04/26/2007  Findings:  Normal hepatic contour.  There is diffuse decreased attenuation of the hepatic parenchyma on this postcontrast examination, there is suggestion of hepatic steatosis.  No discrete hepatic lesions. Normal gallbladder.  No intra or extrahepatic biliary ductal dilatation.  There is symmetric enhancement and screw through the bilateral kidneys.  No discrete renal lesions.  No urinary obstruction.  The bilateral adrenal  glands are normal.  Normal pancreas.  Their pancreas is thickened with associated extensive stranding and fluid about the pancreas.  There is no definitive evidence of pancreatic ductal dilatation.  The pancreas enhances normally.  No drainable peripancreatic fluid collection or abscess.  Scattered colonic diverticulosis without evidence of diverticulitis.  Enteric contrast extends to the distal small bowel.  No evidence of enteric obstruction.  Normal appendix.  No pneumoperitoneum, pneumatosis or portal venous gas.  Normal caliber of the abdominal aorta.  The major branch vessels of the abdominal aorta, including the IMA, are patent.  The splenic artery and vein are patent.  Limited visualization of the lung bases demonstrates bibasilar ground-glass opacities favored to represent atelectasis.  No pneumothorax.  Normal heart size.  No pericardial effusion. Minimal calcifications within the aortic valve annulus.  No acute or aggressive osseous abnormalities.  Minimal degenerative change of L5 - S1.  IMPRESSION:  1.  Findings compatible with acute pancreatitis.  No definitive areas of pancreatic necrosis.  No drainable peripancreatic fluid collection or abscess.  2. Hepatic steatosis.  No intra or extrahepatic biliary ductal dilatation.  Original Report Authenticated By: Waynard Reeds, M.D.   Dg Chest Port 1 View  12/30/2011  *RADIOLOGY REPORT*  Clinical Data: Fever and pancreatitis  PORTABLE CHEST - 1 VIEW  Comparison: 10/15/2011  Findings: Mildly degraded exam due to AP portable technique and patient body habitus.  Numerous leads and wires project over the chest.  Normal heart size for level of inspiration.  No definite pleural effusion. No pneumothorax.  Extremely low lung volumes.  Bibasilar volume loss.  IMPRESSION: Low lung volumes with bibasilar volume loss/atelectasis.  No definite acute disease.  Decreased sensitivity and specificity exam due to technique related factors, as described above.  Original  Report Authenticated By: Consuello Bossier, M.D.    Review of Systems  Respiratory: Positive for shortness of breath. Negative for cough.   Cardiovascular: Negative for leg swelling.  Gastrointestinal: Positive for nausea and abdominal pain. Negative for vomiting.  Neurological: Positive for weakness.   Blood pressure 120/76, pulse 133, temperature 98.6 F (37 C), temperature source Oral, resp. rate 37, height 5\' 10"  (1.778 m), weight 104.4 kg (230 lb 2.6 oz), SpO2 96.00%. Physical Exam  Constitutional: He is oriented to person, place, and time.       Patient moment seems to be alert he doesn't seem to be in any apparent distress.  Eyes: No scleral icterus.  Neck: No JVD present.  Cardiovascular: Normal rate, regular rhythm and normal heart sounds.   No murmur heard. Respiratory: He has no wheezes.  GI: He exhibits distension. There is tenderness. There is no rebound and no guarding.  Musculoskeletal: He exhibits no edema.  Neurological: He is alert and oriented to person, place, and time.    Assessment/Plan: Problem #1 renal failure acute. Since the patient has history of nausea or vomiting and also high urine specific gravity pain and has received CT scan with contrast was in the last 24 hours is most likely contrast induced acute renal failure. However since his creatinine was 1.69 him before the patient might have additional insult to the kidney possibly from prerenal syndrome and  ATN. Problem #2 history of pancreatitis Problem #3 history of elevated liver function tests and with distended abdomen  Problem #4 hyperkalemia Problem #5 history of asthma Problem #6 hypocalcemia calcium 5.4 corrected for albumin of 2.7 calcium is still remain so her 6.4. This could be secondary to his pancreatitis. Problem #7 fever Problem #8 history of obesity. Plan: Because of his nausea and also hyperkalemia I discussed with him and  possibly hemodialysis may be a reasonable option but has this moment  patient her decline. If  patient tolerates as this moment we may need to continue with kyoxalate. We'll give him calcium gluconate 2 ampules him to 250 cc of normal saline at 50 cc per hour. I agree with  aggressive hydration. We'll follow his blood work.  Kellene Mccleary S 12/30/2011, 2:13 PM

## 2011-12-30 NOTE — Progress Notes (Signed)
Repeat BMET results delayed because of lipemic specimen. Potassium still high. Creatinine high. Foley catheter placed. Patient remains febrile tachycardic. Pro calcitonin is 19. Lactic acid is above 3. Patient will be transferred to step down for monitoring and treatment of early sepsis. Patient was treated with D50, insulin, IV bicarbonate after the second BMET was drawn but not yet resulted. I will give a dose of Kayexalate and IV calcium as well. get blood cultures, consult nephrology, start empiric broad-spectrum antibiotics. I have spoken with Dr. Kristian Covey. Repeat basic metabolic panel 3 PM. Bolus fluids. place PICC line. Complete abdominal ultrasound to reimage the gallbladder, pancreas and kidneys. Patient is alert, slightly tachypneic, abdomen is soft but tender diffusely. CPK is elevated, but patient has no chest pain. EKG showed nothing concerning. Will cycle cardiac enzymes. Could be rhabdomyolysis. Will also get a urine culture and urine drug screen. Chest x-ray is an expiratory film but shows no obvious infiltrate. Urinalysis shows many bacteria and granular casts. Only trace hemoglobin, with negative leukocyte esterase, negative nitrites. 70-10 white cells and red cells. Critical care time 30 minutes.

## 2011-12-30 NOTE — Progress Notes (Signed)
Per nursing staff, patient started having alcohol withdrawal last night consisting of tachycardia, fever, anxiety, blurry vision. Reportedly adequate urine output  Subjective: Complains of pain. Thirsty. No shortness of breath. No cough. No dysuria or hesitancy. Had fevers chills and sweats overnight. Feels "pins and needles". Denies anxiety or hallucinations.  Objective: Vital signs in last 24 hours: Filed Vitals:   12/29/11 1119 12/29/11 1515 12/29/11 2113 12/30/11 0606  BP: 116/78 121/79 123/83 108/77  Pulse: 90 97 103 137  Temp: 98.4 F (36.9 C) 98.4 F (36.9 C) 98.2 F (36.8 C) 101.2 F (38.4 C)  TempSrc: Oral Oral Oral Axillary  Resp: 18 18 18 19   Height: 5\' 10"  (1.778 m)     Weight: 104.4 kg (230 lb 2.6 oz)     SpO2: 92% 92% 92% 92%   Weight change: 6.423 kg (14 lb 2.6 oz)  Intake/Output Summary (Last 24 hours) at 12/30/11 0951 Last data filed at 12/30/11 0600  Gross per 24 hour  Intake   3170 ml  Output      0 ml  Net   3170 ml   Physical Exam:  General: Alert. Oriented and appropriate. Cooperative. Appears somewhat anxious. Lungs clear to auscultation bilaterally without wheeze rhonchi or rales Cardiovascular tachycardic regular no murmurs gallops rubs Abdomen: Soft, diffusely tender. Normal bowel sounds Lungs clear to auscultation bilaterally without wheeze rhonchi or rales  Lab Results: Basic Metabolic Panel:  Lab 12/30/11 1610 12/29/11 0436  NA 133* 138  K 6.0* 4.1  CL 101 100  CO2 16* 27  GLUCOSE 134* 131*  BUN 25* 17  CREATININE 3.58* 1.69*  CALCIUM 5.4* 8.5  MG -- --  PHOS -- --   Liver Function Tests:  Lab 12/30/11 0640 12/29/11 0436  AST 141* 170*  ALT 82* 108*  ALKPHOS 77 75  BILITOT 1.3* 0.6  PROT 6.7 7.3  ALBUMIN 2.7* 3.3*    Lab 12/30/11 0640 12/29/11 0436  LIPASE 1946* 1273*  AMYLASE -- --   No results found for this basename: AMMONIA:2 in the last 168 hours CBC:  Lab 12/29/11 0436  WBC 6.9  NEUTROABS 4.3  HGB 15.4  HCT  40.9  MCV 84.0  PLT 205   Cardiac Enzymes: No results found for this basename: CKTOTAL:3,CKMB:3,CKMBINDEX:3,TROPONINI:3 in the last 168 hours BNP: No results found for this basename: PROBNP:3 in the last 168 hours D-Dimer: No results found for this basename: DDIMER:2 in the last 168 hours CBG: No results found for this basename: GLUCAP:6 in the last 168 hours Hemoglobin A1C: No results found for this basename: HGBA1C in the last 168 hours Fasting Lipid Panel:  Lab 12/30/11 0640  CHOL --  HDL --  LDLCALC --  TRIG 1194*  CHOLHDL --  LDLDIRECT --   Thyroid Function Tests: No results found for this basename: TSH,T4TOTAL,FREET4,T3FREE,THYROIDAB in the last 168 hours Coagulation:  Lab 12/30/11 0640  LABPROT 14.7  INR 1.13   Anemia Panel: No results found for this basename: VITAMINB12,FOLATE,FERRITIN,TIBC,IRON,RETICCTPCT in the last 168 hours Urine Drug Screen: Drugs of Abuse     Component Value Date/Time   LABOPIA POSITIVE* 10/15/2011 1908   COCAINSCRNUR NONE DETECTED 10/15/2011 1908   LABBENZ NONE DETECTED 10/15/2011 1908   AMPHETMU NONE DETECTED 10/15/2011 1908   THCU NONE DETECTED 10/15/2011 1908   LABBARB NONE DETECTED 10/15/2011 1908    Alcohol Level: No results found for this basename: ETH:2 in the last 168 hours Urinalysis: No results found for this basename: COLORURINE:2,APPERANCEUR:2,LABSPEC:2,PHURINE:2,GLUCOSEU:2,HGBUR:2,BILIRUBINUR:2,KETONESUR:2,PROTEINUR:2,UROBILINOGEN:2,NITRITE:2,LEUKOCYTESUR:2 in the last 168  hours  Micro Results: No results found for this or any previous visit (from the past 240 hour(s)). Studies/Results: Ct Abdomen Pelvis W Contrast  12/29/2011  *RADIOLOGY REPORT*  Clinical Data: Abdominal pain, elevated LFTs and lipase, no history of pancreatitis  CT ABDOMEN AND PELVIS WITH CONTRAST  Technique:  Multidetector CT imaging of the abdomen and pelvis was performed following the standard protocol during bolus administration of intravenous contrast.   Contrast:  80 ml Omnipaque-300  Comparison: CT abdomen pelvis - 04/26/2007  Findings:  Normal hepatic contour.  There is diffuse decreased attenuation of the hepatic parenchyma on this postcontrast examination, there is suggestion of hepatic steatosis.  No discrete hepatic lesions. Normal gallbladder.  No intra or extrahepatic biliary ductal dilatation.  There is symmetric enhancement and screw through the bilateral kidneys.  No discrete renal lesions.  No urinary obstruction.  The bilateral adrenal glands are normal.  Normal pancreas.  Their pancreas is thickened with associated extensive stranding and fluid about the pancreas.  There is no definitive evidence of pancreatic ductal dilatation.  The pancreas enhances normally.  No drainable peripancreatic fluid collection or abscess.  Scattered colonic diverticulosis without evidence of diverticulitis.  Enteric contrast extends to the distal small bowel.  No evidence of enteric obstruction.  Normal appendix.  No pneumoperitoneum, pneumatosis or portal venous gas.  Normal caliber of the abdominal aorta.  The major branch vessels of the abdominal aorta, including the IMA, are patent.  The splenic artery and vein are patent.  Limited visualization of the lung bases demonstrates bibasilar ground-glass opacities favored to represent atelectasis.  No pneumothorax.  Normal heart size.  No pericardial effusion. Minimal calcifications within the aortic valve annulus.  No acute or aggressive osseous abnormalities.  Minimal degenerative change of L5 - S1.  IMPRESSION:  1.  Findings compatible with acute pancreatitis.  No definitive areas of pancreatic necrosis.  No drainable peripancreatic fluid collection or abscess.  2. Hepatic steatosis.  No intra or extrahepatic biliary ductal dilatation.  Original Report Authenticated By: Waynard Reeds, M.D.   Scheduled Meds:   . LORazepam  0-4 mg Intravenous Q6H   Followed by  . LORazepam  0-4 mg Intravenous Q12H  . thiamine  100  mg Intravenous Daily   Continuous Infusions:   . sodium chloride 200 mL/hr at 12/30/11 0925  . DISCONTD: sodium chloride Stopped (12/29/11 1230)   PRN Meds:.acetaminophen, acetaminophen, alum & mag hydroxide-simeth, HYDROmorphone, LORazepam, LORazepam  EKG shows sinus tachycardia without any QRS widening or peaking of the T-wave waves  Assessment/Plan:   *Acute pancreatitis secondary to alcohol and possibly a component of hypertriglyceridemia   Alcohol withdrawal, continue Ativan protocol and thiamine.   ARF (acute renal failure) likely prerenal due to third spacing. Need to watch for developing sepsis. We'll increase IV fluids and monitor strict ins and outs. Check urinalysis. No obstruction noted on CT of the abdomen. Check magnesium, phosphorus, total CPK.   Hyperkalemia: Repeat a stat. Patient has not had any supplemental potassium. Monitor on telemetry.   Alcohol abuse   Metabolic acidosis, anion gap is 16. likely lactic acidosis. We'll check serum lactate level.   Fever: Check urinalysis and portable chest x-ray. Check pro calcitonin.   Hypertriglyceridemia: Would start Lopid once medically more stable   HTN (hypertension) has not been a problem while here.   Hepatic steatosis   Hypocalcemia secondary to pancreatitis.  Low threshold to transfer to step down for ICU. Vitals every 4 hours.   LOS: 1 day  Peter [name] L 12/30/2011, 9:51 AM

## 2011-12-30 NOTE — Progress Notes (Signed)
CRITICAL VALUE ALERT  Critical value received:  Potassium 6.5; calcium 5.4  Date of notification:  12/30/2011  Time of notification:  1237  Critical value read back:yes  Nurse who received alert:  N.Quinnlan Abruzzo,RN  MD notified (1st page):  Lendell Caprice  Time of first page:  1248  MD notified (2nd page):  Time of second page:  Responding MD:  Lendell Caprice  Time MD responded:  1255-orders given to send to stepdown

## 2011-12-30 NOTE — Progress Notes (Signed)
Pt reports being unable to void at this time. Last time per pt/family in room was approximately 9 hours ago. Bladder scan shows 328cc of urine. Will notify MD.

## 2011-12-30 NOTE — Progress Notes (Signed)
Patient sent to stepdown after giving report to Hilda Lias, [place]. Family aware.

## 2011-12-30 NOTE — Progress Notes (Signed)
CRITICAL VALUE ALERT  Critical value received: CKMB 7.8, Calcium 4.8  Date of notification:  12/30/10  Time of notification:  1530  Critical value read back:yes  Nurse who received alert:  M. Nadir Vasques,RN  MD notified (1st page):  Dr. Lendell Caprice  Time of first page:  1531  MD notified (2nd page):  Time of second page:  Responding MD:  Dr. Lendell Caprice  Time MD responded: [phone]

## 2011-12-30 NOTE — Progress Notes (Signed)
CRITICAL VALUE ALERT  Critical value received:  Calcium 5.4  Date of notification:  12/30/2011  Time of notification:  0820  Critical value read back:yes  Nurse who received alert:  N.Ossiel Marchio,RN  MD notified (1st page):  Lendell Caprice  Time of first page:  0820  MD notified (2nd page):  Time of second page:  Responding MD:  Lendell Caprice  Time MD responded:  [phone]

## 2011-12-30 NOTE — Progress Notes (Signed)
Name: Peter [name] MRN: [id number] DOB: 05-05-1971  ELECTRONIC ICU PHYSICIAN NOTE  Problem:  Responded to sepsis alert.  Dx: Pancreatitis / DT.  Normotensive / tachycardic.  Lactate < 4.  PCT = 20. Mg = 1.3.  On Primaxin.  Intervention:  IVF continued.  Lasix d/c'd.  CIWA / Ativan PRN rescheduled to q4h.  Magnesium Sulfate 2 g IV ordered x 1.  Orlean Bradford, M.D., F.C.C.P. Pulmonary and Critical Care Medicine [facility] Cell: [phone] Pager: [phone]  12/30/2011, 3:43 PM  \

## 2011-12-31 ENCOUNTER — Inpatient Hospital Stay (HOSPITAL_COMMUNITY): Payer: Self-pay

## 2011-12-31 ENCOUNTER — Encounter (HOSPITAL_COMMUNITY): Payer: Self-pay

## 2011-12-31 DIAGNOSIS — N390 Urinary tract infection, site not specified: Secondary | ICD-10-CM | POA: Diagnosis present

## 2011-12-31 DIAGNOSIS — M6282 Rhabdomyolysis: Secondary | ICD-10-CM | POA: Diagnosis present

## 2011-12-31 DIAGNOSIS — A419 Sepsis, unspecified organism: Secondary | ICD-10-CM | POA: Diagnosis not present

## 2011-12-31 LAB — COMPREHENSIVE METABOLIC PANEL
Alkaline Phosphatase: 71 U/L (ref 39–117)
BUN: 37 mg/dL — ABNORMAL HIGH (ref 6–23)
Creatinine, Ser: 5.45 mg/dL — ABNORMAL HIGH (ref 0.50–1.35)
GFR calc Af Amer: 14 mL/min — ABNORMAL LOW (ref >90)
Glucose, Bld: 175 mg/dL — ABNORMAL HIGH (ref 70–99)
Potassium: 5.2 mEq/L — ABNORMAL HIGH (ref 3.5–5.1)
Total Protein: 6.2 g/dL (ref 6.0–8.3)

## 2011-12-31 LAB — CBC
HCT: 40 % (ref 39.0–52.0)
Platelets: 160 10*3/uL (ref 150–400)
RBC: 4.66 MIL/uL (ref 4.22–5.81)
RDW: 15.3 % (ref 11.5–15.5)
WBC: 12.2 10*3/uL — ABNORMAL HIGH (ref 4.0–10.5)

## 2011-12-31 LAB — DIFFERENTIAL
Basophils Absolute: 0 10*3/uL (ref 0.0–0.1)
Lymphocytes Relative: 5 % — ABNORMAL LOW (ref 12–46)
Lymphs Abs: 0.6 10*3/uL — ABNORMAL LOW (ref 0.7–4.0)
Monocytes Absolute: 0.8 10*3/uL (ref 0.1–1.0)
Neutro Abs: 10.7 10*3/uL — ABNORMAL HIGH (ref 1.7–7.7)

## 2011-12-31 LAB — GLUCOSE, CAPILLARY
Glucose-Capillary: 142 mg/dL — ABNORMAL HIGH (ref 70–99)
Glucose-Capillary: 139 mg/dL — ABNORMAL HIGH (ref 70–99)

## 2011-12-31 LAB — URINE CULTURE: Culture: NO GROWTH

## 2011-12-31 LAB — LIPASE, BLOOD: Lipase: 915 U/L — ABNORMAL HIGH (ref 11–59)

## 2011-12-31 LAB — CALCIUM, IONIZED: Calcium, Ion: 0.65 mmol/L — CL (ref 1.12–1.32)

## 2011-12-31 LAB — CK: Total CK: 2847 U/L — ABNORMAL HIGH (ref 7–232)

## 2011-12-31 MED ORDER — ALBUMIN HUMAN 25 % IV SOLN
25.0000 g | Freq: Once | INTRAVENOUS | Status: AC
  Administered 2011-12-31: 25 g via INTRAVENOUS
  Filled 2011-12-31: qty 100

## 2011-12-31 MED ORDER — INSULIN ASPART 100 UNIT/ML ~~LOC~~ SOLN: 0.0000 [IU] | Freq: Every day | SUBCUTANEOUS | Status: DC

## 2011-12-31 MED ORDER — FUROSEMIDE 10 MG/ML IJ SOLN
100.0000 mg | Freq: Two times a day (BID) | INTRAMUSCULAR | Status: DC
  Administered 2011-12-31 – 2012-01-02 (×5): 100 mg via INTRAVENOUS
  Filled 2011-12-31 (×5): qty 10

## 2011-12-31 MED ORDER — SODIUM CHLORIDE 0.9 % IJ SOLN
10.0000 mL | INTRAMUSCULAR | Status: DC | PRN
  Administered 2012-01-06: 20 mL
  Administered 2012-01-09: 10 mL
  Filled 2011-12-31: qty 3
  Filled 2011-12-31: qty 9
  Filled 2011-12-31: qty 6
  Filled 2011-12-31: qty 3
  Filled 2011-12-31: qty 9
  Filled 2011-12-31 (×3): qty 3

## 2011-12-31 MED ORDER — SODIUM CHLORIDE 0.9 % IJ SOLN
INTRAMUSCULAR | Status: AC
  Filled 2011-12-31: qty 6

## 2011-12-31 MED ORDER — SODIUM CHLORIDE 0.9 % IJ SOLN
10.0000 mL | Freq: Two times a day (BID) | INTRAMUSCULAR | Status: DC
  Administered 2011-12-31: 10 mL
  Administered 2011-12-31: 40 mL
  Administered 2012-01-01 (×2): 10 mL
  Administered 2012-01-02: 30 mL
  Administered 2012-01-02 – 2012-01-03 (×2): 10 mL
  Administered 2012-01-03: 20 mL
  Administered 2012-01-04 (×2): 10 mL
  Administered 2012-01-04: 17:00:00
  Administered 2012-01-05 – 2012-01-11 (×9): 10 mL
  Filled 2011-12-31 (×4): qty 3
  Filled 2011-12-31 (×2): qty 9
  Filled 2011-12-31 (×6): qty 3
  Filled 2011-12-31: qty 6
  Filled 2011-12-31 (×3): qty 3

## 2011-12-31 MED ORDER — SODIUM CHLORIDE 0.9 % IV SOLN
2.0000 g | Freq: Once | INTRAVENOUS | Status: AC
  Administered 2011-12-31: 2 g via INTRAVENOUS
  Filled 2011-12-31: qty 20

## 2011-12-31 MED ORDER — BISACODYL 10 MG RE SUPP
10.0000 mg | Freq: Every day | RECTAL | Status: DC | PRN
  Administered 2012-01-02 – 2012-01-07 (×2): 10 mg via RECTAL
  Filled 2011-12-31 (×3): qty 1

## 2011-12-31 MED ORDER — INSULIN ASPART 100 UNIT/ML ~~LOC~~ SOLN
0.0000 [IU] | Freq: Three times a day (TID) | SUBCUTANEOUS | Status: DC
  Administered 2011-12-31 (×2): 1 [IU] via SUBCUTANEOUS
  Administered 2012-01-01: 2 [IU] via SUBCUTANEOUS
  Administered 2012-01-02: 1 [IU] via SUBCUTANEOUS

## 2011-12-31 MED ORDER — SODIUM CHLORIDE 0.9 % IJ SOLN
INTRAMUSCULAR | Status: AC
  Filled 2011-12-31: qty 3

## 2011-12-31 NOTE — Progress Notes (Signed)
Per nursing staff, constipated. Rectal exam revealed hard stool. Requiring ativan for withdrawal  Subjective: Abdominal pain a little bit better. More bloated. No flatus or bowel movement. Nauseated yesterday. Vomited yesterday. No dyspnea. No chest pain. No cough  Objective: Vital signs in last 24 hours: Filed Vitals:   12/31/11 0314 12/31/11 0400 12/31/11 0500 12/31/11 0600  BP: 120/101 115/69 102/78 119/88  Pulse:  129 129 127  Temp:  98.3 F (36.8 C)    TempSrc:  Axillary    Resp:  20 27 26   Height:      Weight:   110.7 kg (244 lb 0.8 oz)   SpO2:  96% 94% 93%   Weight change: 2.5 kg (5 lb 8.2 oz)  Intake/Output Summary (Last 24 hours) at 12/31/11 0918 Last data filed at 12/31/11 0600  Gross per 24 hour  Intake 4993.17 ml  Output    800 ml  Net 4193.17 ml   Physical Exam:  General: Alert. Oriented and appropriate. Cooperative. Less Anxious today Lungs clear to auscultation bilaterally without wheeze rhonchi or rales. Less tachypneic today Cardiovascular tachycardic regular no murmurs gallops rubs Abdomen: diffusely tender. Normal bowel sounds. More distended and firm Extremities no clubbing cyanosis or edema  Lab Results: Basic Metabolic Panel:  Lab 12/31/11 1610 12/30/11 1735 12/30/11 0824  NA 132* 134* --  K 5.2* 4.9 --  CL 102 104 --  CO2 18* 15* --  GLUCOSE 175* 259* --  BUN 37* 34* --  CREATININE 5.45* 4.56* --  CALCIUM 4.8* 5.2* --  MG -- -- 1.3*  PHOS -- -- 3.7   Liver Function Tests:  Lab 12/31/11 0432 12/30/11 0640  AST 156* 141*  ALT 60* 82*  ALKPHOS 71 77  BILITOT 1.1 1.3*  PROT 6.2 6.7  ALBUMIN 2.3* 2.7*    Lab 12/31/11 0432 12/30/11 0640  LIPASE 915* 1946*  AMYLASE -- --   No results found for this basename: AMMONIA:2 in the last 168 hours CBC:  Lab 12/31/11 0432 12/29/11 0436  WBC 12.2* 6.9  NEUTROABS 10.7* 4.3  HGB 15.1 15.4  HCT 40.0 40.9  MCV 85.8 84.0  PLT 160 205   Cardiac Enzymes:  Lab 12/31/11 0432 12/30/11 1735  12/30/11 1407  CKTOTAL 2847* 2078* 1669*  CKMB -- 8.3* 7.8*  CKMBINDEX -- -- --  TROPONINI -- <0.30 <0.30   BNP: No results found for this basename: PROBNP:3 in the last 168 hours D-Dimer: No results found for this basename: DDIMER:2 in the last 168 hours CBG:  Lab 12/30/11 2355 12/30/11 1432  GLUCAP 237* 222*   Hemoglobin A1C: No results found for this basename: HGBA1C in the last 168 hours Fasting Lipid Panel:  Lab 12/30/11 0640  CHOL --  HDL --  LDLCALC --  TRIG 1194*  CHOLHDL --  LDLDIRECT --   Thyroid Function Tests: No results found for this basename: TSH,T4TOTAL,FREET4,T3FREE,THYROIDAB in the last 168 hours Coagulation:  Lab 12/30/11 0640  LABPROT 14.7  INR 1.13   Anemia Panel: No results found for this basename: VITAMINB12,FOLATE,FERRITIN,TIBC,IRON,RETICCTPCT in the last 168 hours Urine Drug Screen: Drugs of Abuse     Component Value Date/Time   LABOPIA POSITIVE* 12/30/2011 1433   COCAINSCRNUR NONE DETECTED 12/30/2011 1433   LABBENZ NONE DETECTED 12/30/2011 1433   AMPHETMU NONE DETECTED 12/30/2011 1433   THCU NONE DETECTED 12/30/2011 1433   LABBARB NONE DETECTED 12/30/2011 1433    Alcohol Level: No results found for this basename: ETH:2 in the last 168 hours Urinalysis:  Lab 12/30/11 1213  COLORURINE YELLOW  LABSPEC >1.030*  PHURINE 5.5  GLUCOSEU NEGATIVE  HGBUR TRACE*  BILIRUBINUR NEGATIVE  KETONESUR NEGATIVE  PROTEINUR NEGATIVE  UROBILINOGEN 0.2  NITRITE NEGATIVE  LEUKOCYTESUR NEGATIVE    Micro Results: Recent Results (from the past 240 hour(s))  CULTURE, BLOOD (ROUTINE X 2)     Status: Normal (Preliminary result)   Collection Time   12/30/11  1:45 PM      Component Value Range Status Comment   Specimen Description BLOOD LEFT HAND   Final    Special Requests BOTTLES DRAWN AEROBIC ONLY 4CC ONE BOTTLE   Final    Culture NO GROWTH <24 HRS   Final    Report Status PENDING   Incomplete   MRSA PCR SCREENING     Status: Normal   Collection Time    12/30/11  1:49 PM      Component Value Range Status Comment   MRSA by PCR NEGATIVE  NEGATIVE  Final   CULTURE, BLOOD (ROUTINE X 2)     Status: Normal (Preliminary result)   Collection Time   12/30/11  2:00 PM      Component Value Range Status Comment   Specimen Description BLOOD LEFT ARM   Final    Special Requests     Final    Value: BOTTLES DRAWN AEROBIC AND ANAEROBIC 4CC EACH BOTTLE   Culture NO GROWTH <24 HRS   Final    Report Status PENDING   Incomplete    Studies/Results: Dg Chest Port 1 View  12/30/2011  *RADIOLOGY REPORT*  Clinical Data: Fever and pancreatitis  PORTABLE CHEST - 1 VIEW  Comparison: 10/15/2011  Findings: Mildly degraded exam due to AP portable technique and patient body habitus.  Numerous leads and wires project over the chest.  Normal heart size for level of inspiration.  No definite pleural effusion. No pneumothorax.  Extremely low lung volumes.  Bibasilar volume loss.  IMPRESSION: Low lung volumes with bibasilar volume loss/atelectasis.  No definite acute disease.  Decreased sensitivity and specificity exam due to technique related factors, as described above.  Original Report Authenticated By: Consuello Bossier, M.D.   Scheduled Meds:    . albumin human  25 g Intravenous Once  . calcium gluconate  1 g Intravenous Once  . calcium gluconate  2 g Intravenous Once  . dextrose  1 ampule Intravenous Once  . furosemide  100 mg Intravenous BID  . imipenem-cilastatin  250 mg Intravenous Q6H  . insulin aspart  0-5 Units Subcutaneous QHS  . insulin aspart  0-9 Units Subcutaneous TID WC  . insulin aspart  6 Units Intravenous Once  . magnesium sulfate 1 - 4 g bolus IVPB  2 g Intravenous Once  . pantoprazole (PROTONIX) IV  40 mg Intravenous Q24H  . sodium bicarbonate  50 mEq Intravenous Once  . sodium chloride  500 mL Intravenous Once  . sodium chloride      . sodium chloride      . sodium polystyrene  30 g Oral Once  . sodium polystyrene  30 g Oral Once  . thiamine   100 mg Intravenous Daily  . DISCONTD: calcium gluconate  1 g Intravenous Once  . DISCONTD: furosemide  60 mg Intravenous BID  . DISCONTD: imipenem-cilastatin  500 mg Intravenous Q8H  . DISCONTD: insulin regular  6 Units Intravenous Once  . DISCONTD: LORazepam  0-4 mg Intravenous Q6H  . DISCONTD: LORazepam  0-4 mg Intravenous Q12H   Continuous Infusions:    .  sodium chloride 200 mL/hr (12/31/11 0800)   PRN Meds:.acetaminophen, bisacodyl, HYDROmorphone, LORazepam, ondansetron (ZOFRAN) IV, DISCONTD: acetaminophen, DISCONTD: acetaminophen, DISCONTD: alum & mag hydroxide-simeth, DISCONTD: LORazepam, DISCONTD: LORazepam  EKG shows sinus tachycardia without any QRS widening or peaking of the T-wave waves  Assessment/Plan:   *Acute pancreatitis secondary to alcohol and possibly a component of hypertriglyceridemia   Alcohol withdrawal, continue Ativan protocol and thiamine.   ARF (acute renal failure) likely prerenal due to third spacing/sepsis and IV contrast nephropathy. Abdominal ultrasound pending.   Hyperkalemia: Slightly improved. Will give insulin. Getting calcium.   Alcohol abuse   Metabolic acidosis likely lactic acidosis and renal failure related. Anion gap is decreased today. 12.   Fever: Resolved. Continue to imipenem. Urinalysis showed many bacteria. Culture is pending.   Hypertriglyceridemia: Would start Lopid once medically more stable   HTN (hypertension) has not been a problem while here.   Hepatic steatosis   Hypocalcemia secondary to pancreatitis. Dr. Kristian Covey has ordered calcium drip.  Sepsis: Temperature, respiratory rate improved. Continue step down monitoring. Blood pressure has not been low. No evidence of developing ARDS. Monitor her saturations carefully and decrease IV fluids 250 an hour.  Rhabdomyolysis: Troponin is normal and no chest pain. Continue IV fluids.  Patient is more distended today and has no flatus or bowel movement. Will check x-ray to  rule out developing ileus or obstruction. According to nursing, patient tried to have a bowel movement and was requesting a laxative. Will order Dulcolax suppository.  Appreciate Dr. Susa Griffins assistance.   LOS: 2 days   Peter [name] L 12/31/2011, 9:18 AM

## 2011-12-31 NOTE — Progress Notes (Signed)
Inpatient Diabetes Program Recommendations  AACE/ADA: New Consensus Statement on Inpatient Glycemic Control  Target Ranges:  Prepandial:   less than 140 mg/dL      Peak postprandial:   less than 180 mg/dL (1-2 hours)      Critically ill patients:  140 - 180 mg/dL  Pager:  [phone] Hours:  8 am-10pm   Reason for Visit: Elevated glucose: 222, 237 mg/dL  Inpatient Diabetes Program Recommendations Correction (SSI): Add Novolog Sensitive Correction q4hrs

## 2011-12-31 NOTE — Progress Notes (Signed)
Subjective: Interval History: has no complaint of nausea or vomiting. He complains of her dry mouth. Patient denies any difficulty in breathing.  Objective: Vital signs in last 24 hours: Temp:  [98.1 F (36.7 C)-101.9 F (38.8 C)] 98.3 F (36.8 C) (03/25 0400) Pulse Rate:  [55-145] 127  (03/25 0600) Resp:  [20-44] 26  (03/25 0600) BP: (80-128)/(55-101) 119/88 mmHg (03/25 0600) SpO2:  [74 %-100 %] 93 % (03/25 0600) Weight:  [106.9 kg (235 lb 10.8 oz)-110.7 kg (244 lb 0.8 oz)] 110.7 kg (244 lb 0.8 oz) (03/25 0500) Weight change: 2.5 kg (5 lb 8.2 oz)  Intake/Output from previous day: 03/24 0701 - 03/25 0700 In: 4098.1 [I.V.:4639.2; IV Piggyback:354] Out: 800 [Urine:800] Intake/Output this shift:    Generally patient is alert no apparent distress Chest clear to auscultation he'll have any rales rhonchi or egophony His heart exam revealed regular rate and rhythm no murmur Abdomen seems to have mild tenderness no rebound tenderness and tympanic. Extremities he doesn't have any edema.   Lab Results:  Basename 12/31/11 0432 12/29/11 0436  WBC 12.2* 6.9  HGB 15.1 15.4  HCT 40.0 40.9  PLT 160 205   BMET:  Basename 12/31/11 0432 12/30/11 1735  NA 132* 134*  K 5.2* 4.9  CL 102 104  CO2 18* 15*  GLUCOSE 175* 259*  BUN 37* 34*  CREATININE 5.45* 4.56*  CALCIUM 4.8* 5.2*   No results found for this basename: PTH:2 in the last 72 hours Iron Studies: No results found for this basename: IRON,TIBC,TRANSFERRIN,FERRITIN in the last 72 hours  Studies/Results: Ct Abdomen Pelvis W Contrast  12/29/2011  *RADIOLOGY REPORT*  Clinical Data: Abdominal pain, elevated LFTs and lipase, no history of pancreatitis  CT ABDOMEN AND PELVIS WITH CONTRAST  Technique:  Multidetector CT imaging of the abdomen and pelvis was performed following the standard protocol during bolus administration of intravenous contrast.  Contrast:  80 ml Omnipaque-300  Comparison: CT abdomen pelvis - 04/26/2007  Findings:   Normal hepatic contour.  There is diffuse decreased attenuation of the hepatic parenchyma on this postcontrast examination, there is suggestion of hepatic steatosis.  No discrete hepatic lesions. Normal gallbladder.  No intra or extrahepatic biliary ductal dilatation.  There is symmetric enhancement and screw through the bilateral kidneys.  No discrete renal lesions.  No urinary obstruction.  The bilateral adrenal glands are normal.  Normal pancreas.  Their pancreas is thickened with associated extensive stranding and fluid about the pancreas.  There is no definitive evidence of pancreatic ductal dilatation.  The pancreas enhances normally.  No drainable peripancreatic fluid collection or abscess.  Scattered colonic diverticulosis without evidence of diverticulitis.  Enteric contrast extends to the distal small bowel.  No evidence of enteric obstruction.  Normal appendix.  No pneumoperitoneum, pneumatosis or portal venous gas.  Normal caliber of the abdominal aorta.  The major branch vessels of the abdominal aorta, including the IMA, are patent.  The splenic artery and vein are patent.  Limited visualization of the lung bases demonstrates bibasilar ground-glass opacities favored to represent atelectasis.  No pneumothorax.  Normal heart size.  No pericardial effusion. Minimal calcifications within the aortic valve annulus.  No acute or aggressive osseous abnormalities.  Minimal degenerative change of L5 - S1.  IMPRESSION:  1.  Findings compatible with acute pancreatitis.  No definitive areas of pancreatic necrosis.  No drainable peripancreatic fluid collection or abscess.  2. Hepatic steatosis.  No intra or extrahepatic biliary ductal dilatation.  Original Report Authenticated By: Mayra Neer.  Judithann Sheen, M.D.   Dg Chest Port 1 View  12/30/2011  *RADIOLOGY REPORT*  Clinical Data: Fever and pancreatitis  PORTABLE CHEST - 1 VIEW  Comparison: 10/15/2011  Findings: Mildly degraded exam due to AP portable technique and patient  body habitus.  Numerous leads and wires project over the chest.  Normal heart size for level of inspiration.  No definite pleural effusion. No pneumothorax.  Extremely low lung volumes.  Bibasilar volume loss.  IMPRESSION: Low lung volumes with bibasilar volume loss/atelectasis.  No definite acute disease.  Decreased sensitivity and specificity exam due to technique related factors, as described above.  Original Report Authenticated By: Consuello Bossier, M.D.    I have reviewed the patient's current medications.  Assessment/Plan: problem #1 renal failure acute possibly dye-induced acute kidney injury with prerenal syndrome  . Presently BUN and creatinine is increasing however his urine output seems to be slightly better. Problem #2 pancreatitis secondary to alcohol abuse improving Problem #3 history of pancreatitis Problem #4 history of rhabdomyolysis CPK still high. Problem #5 history of hypocalcemia . Corrected calcium for albumin still remains low. Problem #6 hyperkalemia potassium seems to be somewhat better. Problem #7 history of hypertension Problem #8 history of metabolic acidosis. Plan: We'll continue his hydration           We'll increase his Lasix           Will supplement his calcium slowly because of his pancreatitis           Will follow patient.    LOS: 2 days   Peter [name] S 12/31/2011,7:28 AM

## 2011-12-31 NOTE — Progress Notes (Signed)
ANTIBIOTIC CONSULT NOTE   Pharmacy Consult for Primaxin Indication: sepsis , pancreatitis  Allergies  Allergen Reactions  . Ace Inhibitors Swelling    Lip swelling   Patient Measurements: Height: 5\' 10"  (177.8 cm) Weight: 244 lb 0.8 oz (110.7 kg) IBW/kg (Calculated) : 73   Vital Signs: Temp: 98.3 F (36.8 C) (03/25 0400) Temp src: Axillary (03/25 0400) BP: 119/88 mmHg (03/25 0600) Pulse Rate: 127  (03/25 0600) Intake/Output from previous day: 03/24 0701 - 03/25 0700 In: 1610.9 [I.V.:4639.2; IV Piggyback:354] Out: 800 [Urine:800] Intake/Output from this shift:    Labs:  Basename 12/31/11 0432 12/30/11 1735 12/30/11 1407 12/29/11 0436  WBC 12.2* -- -- 6.9  HGB 15.1 -- -- 15.4  PLT 160 -- -- 205  LABCREA -- -- -- --  CREATININE 5.45* 4.56* 3.87* --   Estimated Creatinine Clearance: 21.8 ml/min (by C-G formula based on Cr of 5.45). No results found for this basename: VANCOTROUGH:2,VANCOPEAK:2,VANCORANDOM:2,GENTTROUGH:2,GENTPEAK:2,GENTRANDOM:2,TOBRATROUGH:2,TOBRAPEAK:2,TOBRARND:2,AMIKACINPEAK:2,AMIKACINTROU:2,AMIKACIN:2, in the last 72 hours   Microbiology: Recent Results (from the past 720 hour(s))  CULTURE, BLOOD (ROUTINE X 2)     Status: Normal (Preliminary result)   Collection Time   12/30/11  1:45 PM      Component Value Range Status Comment   Specimen Description BLOOD LEFT HAND   Final    Special Requests BOTTLES DRAWN AEROBIC ONLY 4CC ONE BOTTLE   Final    Culture NO GROWTH <24 HRS   Final    Report Status PENDING   Incomplete   MRSA PCR SCREENING     Status: Normal   Collection Time   12/30/11  1:49 PM      Component Value Range Status Comment   MRSA by PCR NEGATIVE  NEGATIVE  Final   CULTURE, BLOOD (ROUTINE X 2)     Status: Normal (Preliminary result)   Collection Time   12/30/11  2:00 PM      Component Value Range Status Comment   Specimen Description BLOOD LEFT ARM   Final    Special Requests     Final    Value: BOTTLES DRAWN AEROBIC AND ANAEROBIC 4CC  EACH BOTTLE   Culture NO GROWTH <24 HRS   Final    Report Status PENDING   Incomplete    Medical History: Past Medical History  Diagnosis Date  . Hypertension    Medications:  Scheduled:     . albumin human  25 g Intravenous Once  . calcium gluconate  1 g Intravenous Once  . calcium gluconate  2 g Intravenous Once  . dextrose  1 ampule Intravenous Once  . furosemide  100 mg Intravenous BID  . imipenem-cilastatin  250 mg Intravenous Q6H  . insulin aspart  6 Units Intravenous Once  . magnesium sulfate 1 - 4 g bolus IVPB  2 g Intravenous Once  . pantoprazole (PROTONIX) IV  40 mg Intravenous Q24H  . sodium bicarbonate  50 mEq Intravenous Once  . sodium chloride  500 mL Intravenous Once  . sodium chloride      . sodium chloride      . sodium polystyrene  30 g Oral Once  . sodium polystyrene  30 g Oral Once  . thiamine  100 mg Intravenous Daily  . DISCONTD: calcium gluconate  1 g Intravenous Once  . DISCONTD: furosemide  60 mg Intravenous BID  . DISCONTD: imipenem-cilastatin  500 mg Intravenous Q8H  . DISCONTD: insulin regular  6 Units Intravenous Once  . DISCONTD: LORazepam  0-4 mg Intravenous Q6H  .  DISCONTD: LORazepam  0-4 mg Intravenous Q12H   Assessment: Reduced renal function CrCl presently 22  Goal of Therapy:  Eradicate infection  Plan:  Primaxin 250 mg IV every 6 hours Adjust dose when renal function improves Labs per protocol  Peter [name] A 12/31/2011,8:03 AM

## 2012-01-01 LAB — COMPREHENSIVE METABOLIC PANEL
ALT: 45 U/L (ref 0–53)
AST: 151 U/L — ABNORMAL HIGH (ref 0–37)
Alkaline Phosphatase: 58 U/L (ref 39–117)
CO2: 17 mEq/L — ABNORMAL LOW (ref 19–32)
Calcium: 5 mg/dL — CL (ref 8.4–10.5)
Chloride: 104 mEq/L (ref 96–112)
GFR calc Af Amer: 17 mL/min — ABNORMAL LOW (ref >90)
GFR calc non Af Amer: 15 mL/min — ABNORMAL LOW (ref >90)
Glucose, Bld: 139 mg/dL — ABNORMAL HIGH (ref 70–99)
Potassium: 5.1 mEq/L (ref 3.5–5.1)
Sodium: 135 mEq/L (ref 135–145)

## 2012-01-01 LAB — GLUCOSE, CAPILLARY
Glucose-Capillary: 133 mg/dL — ABNORMAL HIGH (ref 70–99)
Glucose-Capillary: 169 mg/dL — ABNORMAL HIGH (ref 70–99)
Glucose-Capillary: 128 mg/dL — ABNORMAL HIGH (ref 70–99)

## 2012-01-01 LAB — MAGNESIUM: Magnesium: 1.5 mg/dL (ref 1.5–2.5)

## 2012-01-01 MED ORDER — SODIUM CHLORIDE 0.9 % IV SOLN
2.0000 g | Freq: Once | INTRAVENOUS | Status: AC
  Administered 2012-01-01: 2 g via INTRAVENOUS
  Filled 2012-01-01: qty 20

## 2012-01-01 MED ORDER — MAGNESIUM SULFATE 50 % IJ SOLN: 2.0000 g | Freq: Once | INTRAMUSCULAR | Status: DC

## 2012-01-01 MED ORDER — MAGNESIUM SULFATE 40 MG/ML IJ SOLN
2.0000 g | Freq: Once | INTRAMUSCULAR | Status: AC
  Administered 2012-01-01: 2 g via INTRAVENOUS
  Filled 2012-01-01: qty 50

## 2012-01-01 MED ORDER — HEPARIN SODIUM (PORCINE) 5000 UNIT/ML IJ SOLN
5000.0000 [IU] | Freq: Three times a day (TID) | INTRAMUSCULAR | Status: DC
  Administered 2012-01-01 – 2012-01-04 (×10): 5000 [IU] via SUBCUTANEOUS
  Filled 2012-01-01 (×10): qty 1

## 2012-01-01 MED ORDER — SODIUM CHLORIDE 0.9 % IV SOLN
INTRAVENOUS | Status: AC
  Filled 2012-01-01: qty 10

## 2012-01-01 MED ORDER — ALBUTEROL SULFATE (5 MG/ML) 0.5% IN NEBU
2.5000 mg | INHALATION_SOLUTION | RESPIRATORY_TRACT | Status: DC | PRN
  Administered 2012-01-01 – 2012-01-06 (×6): 2.5 mg via RESPIRATORY_TRACT
  Filled 2012-01-01 (×6): qty 0.5

## 2012-01-01 NOTE — Progress Notes (Signed)
Subjective: Feels a little better. Had a bowel movement yesterday per his report, though none is charted in the nursing notes. Some nausea. Some pain. Patient seems slightly confused. Unsure how reliable his history and is  Objective: Vital signs in last 24 hours: Filed Vitals:   01/01/12 0400 01/01/12 0500 01/01/12 0600 01/01/12 0800  BP: 112/73 129/90 116/47   Pulse: 126 122 124   Temp: 98.2 F (36.8 C)   97.8 F (36.6 C)  TempSrc: Oral   Oral  Resp:      Height:      Weight:  114.3 kg (251 lb 15.8 oz)    SpO2: 96% 95% 95%    Weight change: 7.4 kg (16 lb 5 oz)  Intake/Output Summary (Last 24 hours) at 01/01/12 0826 Last data filed at 01/01/12 0600  Gross per 24 hour  Intake   4574 ml  Output   2150 ml  Net   2424 ml   Physical Exam:  General: Groggy. Comfortable. Sitting at the side of the bed. Slightly confused, giving different answers to the same questions. Calm and cooperative Lungs diminished at the bases. Clear to auscultation. Breathing nonlabored. Cardiovascular tachycardic regular no murmurs gallops rubs Abdomen: Term. Less distended. Bowel sounds present. Less tender. Extremities no clubbing cyanosis or edema  Lab Results: Basic Metabolic Panel:  Lab 01/01/12 1191 01/01/12 0436 12/31/11 0432 12/30/11 0824  NA -- 135 132* --  K -- 5.1 5.2* --  CL -- 104 102 --  CO2 -- 17* 18* --  GLUCOSE -- 139* 175* --  BUN -- 45* 37* --  CREATININE -- 4.53* 5.45* --  CALCIUM -- 5.0* 4.8* --  MG 1.5 -- -- 1.3*  PHOS -- 3.2 -- 3.7   Liver Function Tests:  Lab 01/01/12 0436 12/31/11 0432  AST 151* 156*  ALT 45 60*  ALKPHOS 58 71  BILITOT 1.2 1.1  PROT 6.4 6.2  ALBUMIN 2.7* 2.3*    Lab 12/31/11 0432 12/30/11 0640  LIPASE 915* 1946*  AMYLASE -- --   No results found for this basename: AMMONIA:2 in the last 168 hours CBC:  Lab 12/31/11 0432 12/29/11 0436  WBC 12.2* 6.9  NEUTROABS 10.7* 4.3  HGB 15.1 15.4  HCT 40.0 40.9  MCV 85.8 84.0  PLT 160 205    Cardiac Enzymes:  Lab 01/01/12 0436 12/31/11 0432 12/30/11 1735 12/30/11 1407  CKTOTAL 4515* 2847* 2078* --  CKMB -- -- 8.3* 7.8*  CKMBINDEX -- -- -- --  TROPONINI -- -- <0.30 <0.30   BNP: No results found for this basename: PROBNP:3 in the last 168 hours D-Dimer: No results found for this basename: DDIMER:2 in the last 168 hours CBG:  Lab 01/01/12 0754 01/01/12 0448 01/01/12 0019 12/31/11 2039 12/31/11 1631 12/31/11 1209  GLUCAP 133* 140* 118* 138* 139* 142*   Hemoglobin A1C:  Lab 12/31/11 0907  HGBA1C 6.0*   Fasting Lipid Panel:  Lab 12/30/11 0640  CHOL --  HDL --  LDLCALC --  TRIG 1194*  CHOLHDL --  LDLDIRECT --   Thyroid Function Tests: No results found for this basename: TSH,T4TOTAL,FREET4,T3FREE,THYROIDAB in the last 168 hours Coagulation:  Lab 12/30/11 0640  LABPROT 14.7  INR 1.13   Anemia Panel: No results found for this basename: VITAMINB12,FOLATE,FERRITIN,TIBC,IRON,RETICCTPCT in the last 168 hours Urine Drug Screen: Drugs of Abuse     Component Value Date/Time   LABOPIA POSITIVE* 12/30/2011 1433   COCAINSCRNUR NONE DETECTED 12/30/2011 1433   LABBENZ NONE DETECTED 12/30/2011 1433  AMPHETMU NONE DETECTED 12/30/2011 1433   THCU NONE DETECTED 12/30/2011 1433   LABBARB NONE DETECTED 12/30/2011 1433    Alcohol Level: No results found for this basename: ETH:2 in the last 168 hours Urinalysis:  Lab 12/30/11 1213  COLORURINE YELLOW  LABSPEC >1.030*  PHURINE 5.5  GLUCOSEU NEGATIVE  HGBUR TRACE*  BILIRUBINUR NEGATIVE  KETONESUR NEGATIVE  PROTEINUR NEGATIVE  UROBILINOGEN 0.2  NITRITE NEGATIVE  LEUKOCYTESUR NEGATIVE    Micro Results: Recent Results (from the past 240 hour(s))  CULTURE, BLOOD (ROUTINE X 2)     Status: Normal (Preliminary result)   Collection Time   12/30/11  1:45 PM      Component Value Range Status Comment   Specimen Description BLOOD LEFT HAND   Final    Special Requests BOTTLES DRAWN AEROBIC ONLY 4CC ONE BOTTLE   Final     Culture NO GROWTH 1 DAY   Final    Report Status PENDING   Incomplete   MRSA PCR SCREENING     Status: Normal   Collection Time   12/30/11  1:49 PM      Component Value Range Status Comment   MRSA by PCR NEGATIVE  NEGATIVE  Final   CULTURE, BLOOD (ROUTINE X 2)     Status: Normal (Preliminary result)   Collection Time   12/30/11  2:00 PM      Component Value Range Status Comment   Specimen Description BLOOD LEFT ARM   Final    Special Requests     Final    Value: BOTTLES DRAWN AEROBIC AND ANAEROBIC 4CC EACH BOTTLE   Culture NO GROWTH 1 DAY   Final    Report Status PENDING   Incomplete   URINE CULTURE     Status: Normal   Collection Time   12/30/11  3:10 PM      Component Value Range Status Comment   Specimen Description URINE, CATHETERIZED   Final    Special Requests NONE   Final    Culture  Setup Time [id number]   Final    Colony Count NO GROWTH   Final    Culture NO GROWTH   Final    Report Status 12/31/2011 FINAL   Final    Studies/Results: US Abdomen Complete  12/31/2011  *RADIOLOGY REPORT*  Clinical Data:  Pancreatitis.  Acute renal failure.  Sepsis. Hypertension.  COMPLETE ABDOMINAL ULTRASOUND  Comparison:  CT of the abdomen pelvis 12/29/2011  Findings:  Gallbladder:  Gallbladder wall is normal in thickness.  Possible sludge identified.  No stones or pericholecystic fluid.  No sonographic Murphy's sign.  Common bile duct:  3.5 mm.  Liver:  The liver appears diffusely echogenic without focal liver lesion.  There is poor delineation of internal hepatic structures. Poor definition of the diaphragm.  IVC:  There is limited evaluation of the inferior vena cava.  The visualized portion appears normal.  Pancreas:  The pancreas is not well seen because of overlying bowel gas.  Spleen:  The spleen is not well seen because of overlying bowel gas.  Right Kidney: The right kidney is 12.2 cm in length.  No hydronephrosis.  Left Kidney:  The left kidney is 13 cm in length.  There is limited  evaluation of the left kidney.  Abdominal aorta:  The distal abdominal aorta is not well seen because of bowel gas.  The proximal aorta is not aneurysmal.  Additional findings:  Note is made of ascites.  IMPRESSION:  1.  No evidence for acute  cholecystitis.  Gallbladder sludge is present. 2.  Portions of the abdomen are not well seen because of bowel gas. 3.  Ascites. 4.  Fatty liver.  Original Report Authenticated By: Patterson Hammersmith, M.D.   Dg Chest Port 1 View  12/30/2011  *RADIOLOGY REPORT*  Clinical Data: Fever and pancreatitis  PORTABLE CHEST - 1 VIEW  Comparison: 10/15/2011  Findings: Mildly degraded exam due to AP portable technique and patient body habitus.  Numerous leads and wires project over the chest.  Normal heart size for level of inspiration.  No definite pleural effusion. No pneumothorax.  Extremely low lung volumes.  Bibasilar volume loss.  IMPRESSION: Low lung volumes with bibasilar volume loss/atelectasis.  No definite acute disease.  Decreased sensitivity and specificity exam due to technique related factors, as described above.  Original Report Authenticated By: Consuello Bossier, M.D.   Dg Abd Acute W/chest  12/31/2011  *RADIOLOGY REPORT*  Clinical Data: PICC line placement  ACUTE ABDOMEN SERIES (ABDOMEN 2 VIEW & CHEST 1 VIEW)  Comparison: 10/15/2011  Findings: 1152 hours.  Study is lordotic with markedly low lung volumes.  This distorts cardiomediastinal anatomy.  There is some apparent bibasilar atelectasis.  Right PICC line tip projects at the mid SVC level, but this position is based on the very low volumes and lordotic positioning.  Upright film shows no intraperitoneal free air.  The supine film shows gaseous small bowel dilatation up to 5.0 cm in diameter. Oral contrast material from the CT scan 2 days ago is seen in the terminal ileum and right colon.  IMPRESSION: Right PICC line tip projects at the mid SVC level.  Please see full report above.  No intraperitoneal free air.   There is diffuse gaseous small bowel distention with oral contrast material from the CT of 2 days ago now seen in the right and transverse colon.  Original Report Authenticated By: ERIC A. MANSELL, M.D.   Scheduled Meds:    . albumin human  25 g Intravenous Once  . calcium gluconate  2 g Intravenous Once  . calcium gluconate  2 g Intravenous Once  . furosemide  100 mg Intravenous BID  . heparin subcutaneous  5,000 Units Subcutaneous Q8H  . imipenem-cilastatin  250 mg Intravenous Q6H  . insulin aspart  0-5 Units Subcutaneous QHS  . insulin aspart  0-9 Units Subcutaneous TID WC  . magnesium sulfate 1 - 4 g bolus IVPB  2 g Intravenous Once  . pantoprazole (PROTONIX) IV  40 mg Intravenous Q24H  . sodium chloride  10-40 mL Intracatheter Q12H  . sodium chloride      . sodium chloride      . sodium polystyrene  30 g Oral Once  . thiamine  100 mg Intravenous Daily  . DISCONTD: magnesium sulfate  2 g Intravenous Once   Continuous Infusions:    . sodium chloride 150 mL/hr at 01/01/12 0600   PRN Meds:.acetaminophen, bisacodyl, HYDROmorphone, LORazepam, ondansetron (ZOFRAN) IV, sodium chloride  EKG shows sinus tachycardia without any QRS widening or peaking of the T-wave waves  Assessment/Plan:   *Acute pancreatitis secondary to alcohol and possibly a component of hypertriglyceridemia, clinically slightly improved   Alcohol withdrawal, continue Ativan protocol and thiamine. seems slightly confused today, but I wonder whether this is medication related rather than withdrawal. Patient does not appear agitated   ARF (acute renal failure) likely prerenal due to third spacing/sepsis and IV contrast nephropathy. Creatinine has peaked   Hyperkalemia: Resolved   Alcohol abuse  Metabolic acidosis   Fever: Resolved. Continue to imipenem. Urinalysis showed many bacteria, but culture is negative. Blood cultures negative to date.    Hypertriglyceridemia: Would start Lopid once medically more  stable   HTN (hypertension) has not been a problem while here.   Hepatic steatosis: LFTs improving   Hypocalcemia secondary to pancreatitis. Dr. Kristian Covey has ordered calcium drip.  Sepsis syndrome improved. Decrease IV fluids to 75 an hour  Rhabdomyolysis: Continue IV fluids.  Less distended today, and patient reports bowel movement yesterday.    LOS: 3 days   Damyen Knoll L 01/01/2012, 8:26 AM

## 2012-01-01 NOTE — Progress Notes (Signed)
Subjective: Interval History: has no complaint of nausea or vomiting. He has some abdominal pain but seems to be getting better. The pain is the suprapubic. And non-radiating. Patient denies any difficulty breathing. Overall her is getting better..  Objective: Vital signs in last 24 hours: Temp:  [97.5 F (36.4 C)-99.2 F (37.3 C)] 98.2 F (36.8 C) (03/26 0400) Pulse Rate:  [122-142] 124  (03/26 0600) Resp:  [24-39] 39  (03/25 1800) BP: (112-147)/(47-110) 116/47 mmHg (03/26 0600) SpO2:  [81 %-100 %] 95 % (03/26 0600) FiO2 (%):  [2 %] 2 % (03/25 0800) Weight:  [114.3 kg (251 lb 15.8 oz)] 114.3 kg (251 lb 15.8 oz) (03/26 0500) Weight change: 7.4 kg (16 lb 5 oz)  Intake/Output from previous day: 03/25 0701 - 03/26 0700 In: 4824 [P.O.:700; I.V.:3600; IV Piggyback:524] Out: 2150 [Urine:2150] Intake/Output this shift:    General appearance: alert, cooperative and no distress Resp: clear to auscultation bilaterally Cardio: regular rate and rhythm, S1, S2 normal, no murmur, click, rub or gallop GI: Abdomen is distended, tympanic and there was some slight tenderness. Has this moment he doesn't have any rebound tenderness and he has an active bowel. Extremities: extremities normal, atraumatic, no cyanosis or edema  Lab Results:  [facility] 12/31/11 0432  WBC 12.2*  HGB 15.1  HCT 40.0  PLT 160   BMET:  Basename 01/01/12 0436 12/31/11 0432  NA 135 132*  K 5.1 5.2*  CL 104 102  CO2 17* 18*  GLUCOSE 139* 175*  BUN 45* 37*  CREATININE 4.53* 5.45*  CALCIUM 5.0* 4.8*   No results found for this basename: PTH:2 in the last 72 hours Iron Studies: No results found for this basename: IRON,TIBC,TRANSFERRIN,FERRITIN in the last 72 hours  Studies/Results: US Abdomen Complete  12/31/2011  *RADIOLOGY REPORT*  Clinical Data:  Pancreatitis.  Acute renal failure.  Sepsis. Hypertension.  COMPLETE ABDOMINAL ULTRASOUND  Comparison:  CT of the abdomen pelvis 12/29/2011  Findings:  Gallbladder:   Gallbladder wall is normal in thickness.  Possible sludge identified.  No stones or pericholecystic fluid.  No sonographic Murphy's sign.  Common bile duct:  3.5 mm.  Liver:  The liver appears diffusely echogenic without focal liver lesion.  There is poor delineation of internal hepatic structures. Poor definition of the diaphragm.  IVC:  There is limited evaluation of the inferior vena cava.  The visualized portion appears normal.  Pancreas:  The pancreas is not well seen because of overlying bowel gas.  Spleen:  The spleen is not well seen because of overlying bowel gas.  Right Kidney: The right kidney is 12.2 cm in length.  No hydronephrosis.  Left Kidney:  The left kidney is 13 cm in length.  There is limited evaluation of the left kidney.  Abdominal aorta:  The distal abdominal aorta is not well seen because of bowel gas.  The proximal aorta is not aneurysmal.  Additional findings:  Note is made of ascites.  IMPRESSION:  1.  No evidence for acute cholecystitis.  Gallbladder sludge is present. 2.  Portions of the abdomen are not well seen because of bowel gas. 3.  Ascites. 4.  Fatty liver.  Original Report Authenticated By: Patterson Hammersmith, M.D.   Dg Chest Port 1 View  12/30/2011  *RADIOLOGY REPORT*  Clinical Data: Fever and pancreatitis  PORTABLE CHEST - 1 VIEW  Comparison: 10/15/2011  Findings: Mildly degraded exam due to AP portable technique and patient body habitus.  Numerous leads and wires project over the chest.  Normal heart size for level of inspiration.  No definite pleural effusion. No pneumothorax.  Extremely low lung volumes.  Bibasilar volume loss.  IMPRESSION: Low lung volumes with bibasilar volume loss/atelectasis.  No definite acute disease.  Decreased sensitivity and specificity exam due to technique related factors, as described above.  Original Report Authenticated By: Consuello Bossier, M.D.   Dg Abd Acute W/chest  12/31/2011  *RADIOLOGY REPORT*  Clinical Data: PICC line placement   ACUTE ABDOMEN SERIES (ABDOMEN 2 VIEW & CHEST 1 VIEW)  Comparison: 10/15/2011  Findings: 1152 hours.  Study is lordotic with markedly low lung volumes.  This distorts cardiomediastinal anatomy.  There is some apparent bibasilar atelectasis.  Right PICC line tip projects at the mid SVC level, but this position is based on the very low volumes and lordotic positioning.  Upright film shows no intraperitoneal free air.  The supine film shows gaseous small bowel dilatation up to 5.0 cm in diameter. Oral contrast material from the CT scan 2 days ago is seen in the terminal ileum and right colon.  IMPRESSION: Right PICC line tip projects at the mid SVC level.  Please see full report above.  No intraperitoneal free air.  There is diffuse gaseous small bowel distention with oral contrast material from the CT of 2 days ago now seen in the right and transverse colon.  Original Report Authenticated By: ERIC A. MANSELL, M.D.    I have reviewed the patient's current medications.  Assessment/Plan: Problem #1 acute kidney injury seems to be secondary to dye-induced renal failure presently patient is converted to none oliguric and BUN and creatinine is improving. Patient presently does not have any nausea vomiting. Problem #2 acute pancreatitis that also seems to be recovering Problem #3 hypertension blood pressure seems to be stable Problem #4 hypocalcemia calcium slightly better but still low Problem #5 history of alcoholic hepatitis. Problem #6 rhabdomyolysis. Problem #7 history of obesity Problem #8 history of UTI  Plan: We'll give him an additional calcium IV since the patient is still n.p.o.           We'll continue his present management           We'll follow his blood work.   LOS: 3 days   Teaira Croft S 01/01/2012,7:18 AM

## 2012-01-01 NOTE — Progress Notes (Signed)
CRITICAL VALUE ALERT  Critical value received: Calcium 5.0  Date of notification:  01/01/12  Time of notification:  0602  Critical value read back:yes  Nurse who received alert: R.Joseph Art  MD notified (1st page):  Orvan Falconer  Time of first page: [phone]  MD notified (2nd page):  Time of second page:  Responding MD:   Time MD responded:

## 2012-01-02 LAB — COMPREHENSIVE METABOLIC PANEL
AST: 117 U/L — ABNORMAL HIGH (ref 0–37)
Albumin: 2.6 g/dL — ABNORMAL LOW (ref 3.5–5.2)
Alkaline Phosphatase: 68 U/L (ref 39–117)
BUN: 42 mg/dL — ABNORMAL HIGH (ref 6–23)
Chloride: 99 mEq/L (ref 96–112)
Creatinine, Ser: 3.73 mg/dL — ABNORMAL HIGH (ref 0.50–1.35)
Potassium: 4.4 mEq/L (ref 3.5–5.1)
Total Protein: 6.3 g/dL (ref 6.0–8.3)

## 2012-01-02 LAB — CBC
Hemoglobin: 9 g/dL — ABNORMAL LOW (ref 13.0–17.0)
Platelets: 103 10*3/uL — ABNORMAL LOW (ref 150–400)
RBC: 3.16 MIL/uL — ABNORMAL LOW (ref 4.22–5.81)
WBC: 7.8 10*3/uL (ref 4.0–10.5)

## 2012-01-02 LAB — CREATININE, URINE, RANDOM: Creatinine, Urine: 202.97 mg/dL

## 2012-01-02 LAB — LIPASE, BLOOD: Lipase: 208 U/L — ABNORMAL HIGH (ref 11–59)

## 2012-01-02 LAB — SODIUM, URINE, RANDOM: Sodium, Ur: 14 mEq/L

## 2012-01-02 LAB — GLUCOSE, CAPILLARY: Glucose-Capillary: 127 mg/dL — ABNORMAL HIGH (ref 70–99)

## 2012-01-02 LAB — TRIGLYCERIDES: Triglycerides: 1178 mg/dL — ABNORMAL HIGH (ref <150)

## 2012-01-02 MED ORDER — SODIUM CHLORIDE 0.9 % IV SOLN
2.0000 g | Freq: Once | INTRAVENOUS | Status: AC
  Administered 2012-01-02: 2 g via INTRAVENOUS
  Filled 2012-01-02 (×2): qty 20

## 2012-01-02 MED ORDER — SODIUM CHLORIDE 0.9 % IV SOLN
500.0000 mg | Freq: Three times a day (TID) | INTRAVENOUS | Status: DC
  Administered 2012-01-02 – 2012-01-11 (×28): 500 mg via INTRAVENOUS
  Filled 2012-01-02 (×37): qty 500

## 2012-01-02 MED ORDER — SODIUM CHLORIDE 0.9 % IN NEBU
INHALATION_SOLUTION | RESPIRATORY_TRACT | Status: AC
  Filled 2012-01-02: qty 3

## 2012-01-02 MED ORDER — VITAMIN B-1 100 MG PO TABS
100.0000 mg | ORAL_TABLET | Freq: Every day | ORAL | Status: DC
  Administered 2012-01-02 – 2012-01-11 (×10): 100 mg via ORAL
  Filled 2012-01-02 (×10): qty 1

## 2012-01-02 MED ORDER — CLONIDINE HCL 0.1 MG PO TABS
0.1000 mg | ORAL_TABLET | Freq: Two times a day (BID) | ORAL | Status: DC
  Administered 2012-01-02 – 2012-01-08 (×13): 0.1 mg via ORAL
  Filled 2012-01-02 (×13): qty 1

## 2012-01-02 MED ORDER — SODIUM CHLORIDE 0.9 % IJ SOLN
INTRAMUSCULAR | Status: AC
  Filled 2012-01-02: qty 3

## 2012-01-02 NOTE — Progress Notes (Addendum)
Pt's foley removed. Suppository given then pt ambulated in hallway about 20-25 feet with nurse and nurse tech with portable oxygen. Pt weak and unsteady, needing assistance for balance. Pt returned to room to sit in chair. HR remained in 120-130. VS stable. Family present at bedside. Call bell in reach.

## 2012-01-02 NOTE — Progress Notes (Signed)
Subjective: Feels a little bit better. Occasional nausea, but tolerating small amounts of clear liquids. Constipated. Feels tired. Had shortness of breath with wheezing, better after bronchodilator. Has a history of asthma. No shortness of breath currently. Per nursing, had a bowel movement yesterday.  Objective: Vital signs in last 24 hours: Filed Vitals:   01/02/12 0400 01/02/12 0500 01/02/12 0600 01/02/12 0800  BP: 134/79 138/98 136/104   Pulse: 136 126 124   Temp: 99.9 F (37.7 C)     TempSrc: Oral   Oral  Resp:  21 15   Height:      Weight:  114 kg (251 lb 5.2 oz)    SpO2: 83% 97% 98%    Weight change: -0.3 kg (-10.6 oz)  Intake/Output Summary (Last 24 hours) at 01/02/12 0933 Last data filed at 01/02/12 0809  Gross per 24 hour  Intake 3718.25 ml  Output   5000 ml  Net -1281.75 ml   Physical Exam:  General: Alert. Oriented and appropriate. Comfortable, sitting at the edge of the bed. Lungs diminished at the bases. Clear to auscultation. Breathing nonlabored. Cardiovascular tachycardic regular no murmurs gallops rubs Abdomen: Less distended. Diminished bowel sounds. Less tender Extremities no clubbing cyanosis or edema Psychiatric: Flat affect. Won't speak much. Cooperative  Lab Results: Basic Metabolic Panel:  Lab 01/02/12 4098 01/01/12 0617 01/01/12 0436 12/30/11 0824  NA 126* -- 135 --  K 4.4 -- 5.1 --  CL 99 -- 104 --  CO2 19 -- 17* --  GLUCOSE 141* -- 139* --  BUN 42* -- 45* --  CREATININE 3.73* -- 4.53* --  CALCIUM 5.2* -- 5.0* --  MG -- 1.5 -- 1.3*  PHOS -- -- 3.2 3.7   Liver Function Tests:  Lab 01/02/12 0001 01/01/12 0436  AST 117* 151*  ALT 37 45  ALKPHOS 68 58  BILITOT 1.3* 1.2  PROT 6.3 6.4  ALBUMIN 2.6* 2.7*    Lab 01/02/12 0425 12/31/11 0432  LIPASE 208* 915*  AMYLASE -- --   No results found for this basename: AMMONIA:2 in the last 168 hours CBC:  Lab 01/02/12 0425 12/31/11 0432 12/29/11 0436  WBC 7.8 12.2* --  NEUTROABS --  10.7* 4.3  HGB 9.0* 15.1 --  HCT 26.7* 40.0 --  MCV 84.5 85.8 --  PLT 103* 160 --   Cardiac Enzymes:  Lab 01/02/12 0425 01/01/12 0436 12/31/11 0432 12/30/11 1735 12/30/11 1407  CKTOTAL 4524* 4515* 2847* -- --  CKMB -- -- -- 8.3* 7.8*  CKMBINDEX -- -- -- -- --  TROPONINI -- -- -- <0.30 <0.30   BNP: No results found for this basename: PROBNP:3 in the last 168 hours D-Dimer: No results found for this basename: DDIMER:2 in the last 168 hours CBG:  Lab 01/02/12 0744 01/01/12 2246 01/01/12 1612 01/01/12 1207 01/01/12 0754 01/01/12 0448  GLUCAP 127* 128* 124* 169* 133* 140*   Hemoglobin A1C:  Lab 12/31/11 0907  HGBA1C 6.0*   Fasting Lipid Panel:  Lab 12/30/11 0640  CHOL --  HDL --  LDLCALC --  TRIG 1194*  CHOLHDL --  LDLDIRECT --   Thyroid Function Tests: No results found for this basename: TSH,T4TOTAL,FREET4,T3FREE,THYROIDAB in the last 168 hours Coagulation:  Lab 12/30/11 0640  LABPROT 14.7  INR 1.13   Anemia Panel: No results found for this basename: VITAMINB12,FOLATE,FERRITIN,TIBC,IRON,RETICCTPCT in the last 168 hours Urine Drug Screen: Drugs of Abuse     Component Value Date/Time   LABOPIA POSITIVE* 12/30/2011 1433   COCAINSCRNUR NONE  DETECTED 12/30/2011 1433   LABBENZ NONE DETECTED 12/30/2011 1433   AMPHETMU NONE DETECTED 12/30/2011 1433   THCU NONE DETECTED 12/30/2011 1433   LABBARB NONE DETECTED 12/30/2011 1433    Alcohol Level: No results found for this basename: ETH:2 in the last 168 hours Urinalysis:  Lab 12/30/11 1213  COLORURINE YELLOW  LABSPEC >1.030*  PHURINE 5.5  GLUCOSEU NEGATIVE  HGBUR TRACE*  BILIRUBINUR NEGATIVE  KETONESUR NEGATIVE  PROTEINUR NEGATIVE  UROBILINOGEN 0.2  NITRITE NEGATIVE  LEUKOCYTESUR NEGATIVE    Micro Results: Recent Results (from the past 240 hour(s))  CULTURE, BLOOD (ROUTINE X 2)     Status: Normal (Preliminary result)   Collection Time   12/30/11  1:45 PM      Component Value Range Status Comment   Specimen  Description BLOOD LEFT HAND   Final    Special Requests BOTTLES DRAWN AEROBIC ONLY 4CC ONE BOTTLE   Final    Culture NO GROWTH 2 DAYS   Final    Report Status PENDING   Incomplete   MRSA PCR SCREENING     Status: Normal   Collection Time   12/30/11  1:49 PM      Component Value Range Status Comment   MRSA by PCR NEGATIVE  NEGATIVE  Final   CULTURE, BLOOD (ROUTINE X 2)     Status: Normal (Preliminary result)   Collection Time   12/30/11  2:00 PM      Component Value Range Status Comment   Specimen Description BLOOD LEFT ARM   Final    Special Requests     Final    Value: BOTTLES DRAWN AEROBIC AND ANAEROBIC 4CC EACH BOTTLE   Culture NO GROWTH 2 DAYS   Final    Report Status PENDING   Incomplete   URINE CULTURE     Status: Normal   Collection Time   12/30/11  3:10 PM      Component Value Range Status Comment   Specimen Description URINE, CATHETERIZED   Final    Special Requests NONE   Final    Culture  Setup Time [id number]   Final    Colony Count NO GROWTH   Final    Culture NO GROWTH   Final    Report Status 12/31/2011 FINAL   Final    Studies/Results: US Abdomen Complete  12/31/2011  *RADIOLOGY REPORT*  Clinical Data:  Pancreatitis.  Acute renal failure.  Sepsis. Hypertension.  COMPLETE ABDOMINAL ULTRASOUND  Comparison:  CT of the abdomen pelvis 12/29/2011  Findings:  Gallbladder:  Gallbladder wall is normal in thickness.  Possible sludge identified.  No stones or pericholecystic fluid.  No sonographic Murphy's sign.  Common bile duct:  3.5 mm.  Liver:  The liver appears diffusely echogenic without focal liver lesion.  There is poor delineation of internal hepatic structures. Poor definition of the diaphragm.  IVC:  There is limited evaluation of the inferior vena cava.  The visualized portion appears normal.  Pancreas:  The pancreas is not well seen because of overlying bowel gas.  Spleen:  The spleen is not well seen because of overlying bowel gas.  Right Kidney: The right kidney is  12.2 cm in length.  No hydronephrosis.  Left Kidney:  The left kidney is 13 cm in length.  There is limited evaluation of the left kidney.  Abdominal aorta:  The distal abdominal aorta is not well seen because of bowel gas.  The proximal aorta is not aneurysmal.  Additional findings:  Note is  made of ascites.  IMPRESSION:  1.  No evidence for acute cholecystitis.  Gallbladder sludge is present. 2.  Portions of the abdomen are not well seen because of bowel gas. 3.  Ascites. 4.  Fatty liver.  Original Report Authenticated By: Patterson Hammersmith, M.D.   Dg Abd Acute W/chest  12/31/2011  *RADIOLOGY REPORT*  Clinical Data: PICC line placement  ACUTE ABDOMEN SERIES (ABDOMEN 2 VIEW & CHEST 1 VIEW)  Comparison: 10/15/2011  Findings: 1152 hours.  Study is lordotic with markedly low lung volumes.  This distorts cardiomediastinal anatomy.  There is some apparent bibasilar atelectasis.  Right PICC line tip projects at the mid SVC level, but this position is based on the very low volumes and lordotic positioning.  Upright film shows no intraperitoneal free air.  The supine film shows gaseous small bowel dilatation up to 5.0 cm in diameter. Oral contrast material from the CT scan 2 days ago is seen in the terminal ileum and right colon.  IMPRESSION: Right PICC line tip projects at the mid SVC level.  Please see full report above.  No intraperitoneal free air.  There is diffuse gaseous small bowel distention with oral contrast material from the CT of 2 days ago now seen in the right and transverse colon.  Original Report Authenticated By: ERIC A. MANSELL, M.D.   Scheduled Meds:    . calcium gluconate  2 g Intravenous Once  . cloNIDine  0.1 mg Oral BID  . furosemide  100 mg Intravenous BID  . heparin subcutaneous  5,000 Units Subcutaneous Q8H  . imipenem-cilastatin  500 mg Intravenous Q8H  . sodium chloride  10-40 mL Intracatheter Q12H  . sodium chloride      . sodium polystyrene  30 g Oral Once  . DISCONTD:  imipenem-cilastatin  250 mg Intravenous Q6H  . DISCONTD: insulin aspart  0-5 Units Subcutaneous QHS  . DISCONTD: insulin aspart  0-9 Units Subcutaneous TID WC  . DISCONTD: pantoprazole (PROTONIX) IV  40 mg Intravenous Q24H  . DISCONTD: thiamine  100 mg Intravenous Daily   Continuous Infusions:    . sodium chloride 75 mL/hr at 01/02/12 0800   PRN Meds:.acetaminophen, albuterol, bisacodyl, HYDROmorphone, LORazepam, ondansetron (ZOFRAN) IV, sodium chloride  EKG shows sinus tachycardia without any QRS widening or peaking of the T-wave waves  Assessment/Plan:   *Acute pancreatitis secondary to alcohol and possibly a component of hypertriglyceridemia, clinically improving, and lipase decreasing. Continue sips of clears for now.   Alcohol withdrawal, continue Ativan protocol and thiamine. Change thiamine to by mouth.   ARF (acute renal failure) likely prerenal due to third spacing/sepsis and IV contrast nephropathy. Creatinine has peaked. Discontinue Foley.   Hyperkalemia: Resolved   Alcohol abuse: Consult social work for discussion of treatment options.   Metabolic acidosis   Fever: Resolved. Continue to imipenem. Urinalysis showed many bacteria, but culture is negative. Blood cultures negative to date.    Hypertriglyceridemia: Would start Lopid once medically more stable   HTN (hypertension): Will add clonidine for blood pressure and withdrawal symptoms.   Hepatic steatosis: LFTs improving   Hypocalcemia secondary to pancreatitis.   Sepsis syndrome improved. Decrease IV fluids to 75 an hour. Continue Primaxin for now. Has had no fevers for several days, and blood cultures have been negative. Urinalysis did however show many bacteria, culture negative. May be able to discontinue Primaxin in a few days if patient continues to stabilize and improve.  Rhabdomyolysis: Continue IV fluids.  Hyperglycemia: Blood sugars better. Will discontinue  insulin. Hemoglobin A1c is 6.0.  Anemia  likely dilutional and acute illness related, monitor for now  Dulcolax for constipation  Would continue step down monitoring for another 24 hours or so.  Stop Lasix and monitor  Increase activity. Continue DVT prophylaxis.   LOS: 4 days   Peter [name] L 01/02/2012, 9:33 AM

## 2012-01-02 NOTE — Progress Notes (Signed)
ANTIBIOTIC CONSULT NOTE   Pharmacy Consult for Primaxin Indication: sepsis , pancreatitis  Allergies  Allergen Reactions  . Ace Inhibitors Swelling    Lip swelling   Patient Measurements: Height: 5\' 10"  (177.8 cm) Weight: 251 lb 5.2 oz (114 kg) IBW/kg (Calculated) : 73   Vital Signs: Temp: 100.6 F (38.1 C) (03/27 0800) Temp src: Oral (03/27 0800) BP: 136/104 mmHg (03/27 0600) Pulse Rate: 124  (03/27 0600) Intake/Output from previous day: 03/26 0701 - 03/27 0700 In: 3445 [P.O.:1200; I.V.:1725; IV Piggyback:520] Out: 5000 [Urine:5000] Intake/Output from this shift:    Labs:  Basename 01/02/12 0425 01/02/12 0001 01/01/12 0436 12/31/11 0432  WBC 7.8 -- -- 12.2*  HGB 9.0* -- -- 15.1  PLT 103* -- -- 160  LABCREA -- -- -- --  CREATININE -- 3.73* 4.53* 5.45*   Estimated Creatinine Clearance: 32.3 ml/min (by C-G formula based on Cr of 3.73). No results found for this basename: VANCOTROUGH:2,VANCOPEAK:2,VANCORANDOM:2,GENTTROUGH:2,GENTPEAK:2,GENTRANDOM:2,TOBRATROUGH:2,TOBRAPEAK:2,TOBRARND:2,AMIKACINPEAK:2,AMIKACINTROU:2,AMIKACIN:2, in the last 72 hours   Microbiology: Recent Results (from the past 720 hour(s))  CULTURE, BLOOD (ROUTINE X 2)     Status: Normal (Preliminary result)   Collection Time   12/30/11  1:45 PM      Component Value Range Status Comment   Specimen Description BLOOD LEFT HAND   Final    Special Requests BOTTLES DRAWN AEROBIC ONLY 4CC ONE BOTTLE   Final    Culture NO GROWTH 2 DAYS   Final    Report Status PENDING   Incomplete   MRSA PCR SCREENING     Status: Normal   Collection Time   12/30/11  1:49 PM      Component Value Range Status Comment   MRSA by PCR NEGATIVE  NEGATIVE  Final   CULTURE, BLOOD (ROUTINE X 2)     Status: Normal (Preliminary result)   Collection Time   12/30/11  2:00 PM      Component Value Range Status Comment   Specimen Description BLOOD LEFT ARM   Final    Special Requests     Final    Value: BOTTLES DRAWN AEROBIC AND ANAEROBIC  4CC EACH BOTTLE   Culture NO GROWTH 2 DAYS   Final    Report Status PENDING   Incomplete   URINE CULTURE     Status: Normal   Collection Time   12/30/11  3:10 PM      Component Value Range Status Comment   Specimen Description URINE, CATHETERIZED   Final    Special Requests NONE   Final    Culture  Setup Time [id number]   Final    Colony Count NO GROWTH   Final    Culture NO GROWTH   Final    Report Status 12/31/2011 FINAL   Final    Medical History: Past Medical History  Diagnosis Date  . Hypertension    Medications:  Scheduled:     . calcium gluconate  2 g Intravenous Once  . furosemide  100 mg Intravenous BID  . heparin subcutaneous  5,000 Units Subcutaneous Q8H  . imipenem-cilastatin  250 mg Intravenous Q6H  . insulin aspart  0-5 Units Subcutaneous QHS  . insulin aspart  0-9 Units Subcutaneous TID WC  . pantoprazole (PROTONIX) IV  40 mg Intravenous Q24H  . sodium chloride  10-40 mL Intracatheter Q12H  . sodium chloride      . sodium polystyrene  30 g Oral Once  . thiamine  100 mg Intravenous Daily   Assessment: Renal function improving. Estimated  Creatinine Clearance: 32.3 ml/min (by C-G formula based on Cr of 3.73).  Micro negative to date.  Goal of Therapy:  Eradicate infection  Plan:  Increase Primaxin to 500mg  IV every 8 hours. Monitor Renal Function. Labs per protocol.  Lamonte Richer R 01/02/2012,8:35 AM

## 2012-01-02 NOTE — BH Assessment (Signed)
Assessment Note   Peter [name] is an 41 y.o. male. The patient was admitted to the hospital for medical reasons and is being detoxed while in the hospital. He states that he has been drinking since and early age. He admits that he drinks daily. He says he drinks maybe 12 beer daily or he will drink maybe 1/2 pint of alcohol daily. His family adds that he drinks more than what he is says. He states that he was maintained sobriety for over a year at one time. Today he denies any thoughts  To harm himself or others. He is not hallucinated nor is he delusional. He is alert and cooperative. He minimizes his problem with alcohol. His family is supportive of the patient and assure this Clinical research associate that they will be assisting him. The patient was reminded that he already has liver damage and that further abuse will only worsen this.   Axis I: Alcohol Abuse Axis II: Deferred Axis III:  Past Medical History  Diagnosis Date  . Hypertension    Axis IV: economic problems, other psychosocial or environmental problems, problems related to social environment, problems with access to health care services and problems with primary support group Axis V: 41-50 serious symptoms  Past Medical History:  Past Medical History  Diagnosis Date  . Hypertension     Past Surgical History  Procedure Date  . Hernia repair     Family History: History reviewed. No pertinent family history.  Social History:  reports that he has never smoked. He does not have any smokeless tobacco history on file. He reports that he drinks about 4.2 ounces of alcohol per week. He reports that he does not use illicit drugs.  Additional Social History:    Allergies:  Allergies  Allergen Reactions  . Ace Inhibitors Swelling    Lip swelling    Home Medications:  Medications Prior to Admission  Medication Dose Route Frequency Provider Last Rate Last Dose  . 0.9 %  sodium chloride infusion   Intravenous Continuous Christiane Ha,  MD 75 mL/hr at 01/02/12 1400    . acetaminophen (TYLENOL) tablet 650 mg  650 mg Oral Q4H PRN Christiane Ha, MD   650 mg at 12/30/11 1613  . albumin human 25 % solution 25 g  25 g Intravenous Once Jamse Mead, MD   25 g at 12/31/11 1244  . albuterol (PROVENTIL) (5 MG/ML) 0.5% nebulizer solution 2.5 mg  2.5 mg Nebulization Q2H PRN Christiane Ha, MD   2.5 mg at 01/02/12 1317  . bisacodyl (DULCOLAX) suppository 10 mg  10 mg Rectal Daily PRN Christiane Ha, MD   10 mg at 01/02/12 1004  . calcium gluconate 1 g in sodium chloride 0.9 % 100 mL IVPB  1 g Intravenous Once Christiane Ha, MD   1 g at 12/30/11 1416  . calcium gluconate 2 g in sodium chloride 0.9 % 100 mL IVPB  2 g Intravenous Once Jamse Mead, MD   2 g at 12/31/11 0908  . calcium gluconate 2 g in sodium chloride 0.9 % 100 mL IVPB  2 g Intravenous Once Jamse Mead, MD   2 g at 01/01/12 0851  . cloNIDine (CATAPRES) tablet 0.1 mg  0.1 mg Oral BID Christiane Ha, MD   0.1 mg at 01/02/12 1004  . dextrose 50 % solution 50 mL  1 ampule Intravenous Once Christiane Ha, MD   50 mL at 12/30/11 1237  .  famotidine (PEPCID) IVPB 20 mg  20 mg Intravenous Once Felisa Bonier, MD   20 mg at 12/29/11 0451  . fentaNYL (SUBLIMAZE) injection 100 mcg  100 mcg Intravenous Once Felisa Bonier, MD   100 mcg at 12/29/11 0747  . heparin injection 5,000 Units  5,000 Units Subcutaneous Q8H Christiane Ha, MD   5,000 Units at 01/02/12 1330  . HYDROmorphone (DILAUDID) injection 1 mg  1 mg Intravenous Once Felisa Bonier, MD   1 mg at 12/29/11 0449  . HYDROmorphone (DILAUDID) injection 1 mg  1 mg Intravenous Once Joya Gaskins, MD   1 mg at 12/29/11 0948  . HYDROmorphone (DILAUDID) injection 1-2 mg  1-2 mg Intravenous Q3H PRN Christiane Ha, MD   2 mg at 01/02/12 1107  . imipenem-cilastatin (PRIMAXIN) 500 mg in sodium chloride 0.9 % 100 mL IVPB  500 mg Intravenous Q8H Christiane Ha, MD   500 mg at  01/02/12 1330  . insulin aspart (novoLOG) injection 6 Units  6 Units Intravenous Once Christiane Ha, MD   6 Units at 12/30/11 1246  . iohexol (OMNIPAQUE) 300 MG/ML solution 100 mL  100 mL Intravenous Once PRN Medication Radiologist, MD   100 mL at 12/29/11 0819  . iohexol (OMNIPAQUE) 300 MG/ML solution 40 mL  40 mL Oral Once PRN Medication Radiologist, MD   40 mL at 12/29/11 0700  . LORazepam (ATIVAN) injection 1-4 mg  1-4 mg Intravenous Q4H PRN Lonia Farber, MD   2 mg at 01/02/12 1213  . magnesium sulfate IVPB 2 g 50 mL  2 g Intravenous Once Lonia Farber, MD   2 g at 12/30/11 1604  . magnesium sulfate IVPB 2 g 50 mL  2 g Intravenous Once Vania Rea, MD   2 g at 01/01/12 0630  . ondansetron (ZOFRAN) injection 4 mg  4 mg Intravenous Once Felisa Bonier, MD   4 mg at 12/29/11 0430  . ondansetron (ZOFRAN) injection 4 mg  4 mg Intravenous Once Felisa Bonier, MD   4 mg at 12/29/11 0747  . ondansetron (ZOFRAN) injection 4 mg  4 mg Intravenous Once Joya Gaskins, MD   4 mg at 12/29/11 0948  . ondansetron (ZOFRAN) injection 4 mg  4 mg Intravenous Q6H PRN Christiane Ha, MD   4 mg at 01/02/12 0809  . sodium bicarbonate injection 50 mEq  50 mEq Intravenous Once Christiane Ha, MD   50 mEq at 12/30/11 1231  . sodium chloride 0.9 % bolus 1,000 mL  1,000 mL Intravenous Once Felisa Bonier, MD   1,000 mL at 12/29/11 0447  . sodium chloride 0.9 % bolus 1,000 mL  1,000 mL Intravenous Once Felisa Bonier, MD   1,000 mL at 12/29/11 0747  . sodium chloride 0.9 % bolus 500 mL  500 mL Intravenous Once Joya Gaskins, MD   500 mL at 12/29/11 0849  . sodium chloride 0.9 % bolus 500 mL  500 mL Intravenous Once Christiane Ha, MD   500 mL at 12/30/11 1330  . sodium chloride 0.9 % injection 10-40 mL  10-40 mL Intracatheter Q12H Christiane Ha, MD   10 mL at 01/01/12 2230  . sodium chloride 0.9 % injection 10-40 mL  10-40 mL Intracatheter PRN Christiane Ha, MD      . sodium chloride 0.9 % injection           . sodium chloride 0.9 % injection           .  sodium chloride 0.9 % injection           . sodium chloride 0.9 % injection           . sodium chloride 0.9 % nebulizer solution           . sodium polystyrene (KAYEXALATE) 15 GM/60ML suspension 30 g  30 g Oral Once Christiane Ha, MD   30 g at 12/30/11 1415  . thiamine (VITAMIN B-1) tablet 100 mg  100 mg Oral Daily Christiane Ha, MD   100 mg at 01/02/12 1004  . DISCONTD: 0.9 %  sodium chloride infusion   Intravenous Continuous Felisa Bonier, MD      . DISCONTD: acetaminophen (TYLENOL) suppository 650 mg  650 mg Rectal Q6H PRN Christiane Ha, MD      . DISCONTD: acetaminophen (TYLENOL) tablet 650 mg  650 mg Oral Q6H PRN Christiane Ha, MD   650 mg at 12/30/11 0610  . DISCONTD: alum & mag hydroxide-simeth (MAALOX/MYLANTA) 200-200-20 MG/5ML suspension 30 mL  30 mL Oral Q6H PRN Christiane Ha, MD      . DISCONTD: calcium gluconate injection - for URGENT use only!  1 g Intravenous Once Christiane Ha, MD      . DISCONTD: furosemide (LASIX) 100 mg in dextrose 5 % 50 mL IVPB  100 mg Intravenous BID Jamse Mead, MD   100 mg at 01/02/12 0803  . DISCONTD: furosemide (LASIX) injection 60 mg  60 mg Intravenous BID Jamse Mead, MD      . DISCONTD: imipenem-cilastatin (PRIMAXIN) 250 mg in sodium chloride 0.9 % 100 mL IVPB  250 mg Intravenous Q6H Christiane Ha, MD   250 mg at 01/02/12 0608  . DISCONTD: imipenem-cilastatin (PRIMAXIN) 500 mg in sodium chloride 0.9 % 100 mL IVPB  500 mg Intravenous Q8H Christiane Ha, MD      . DISCONTD: insulin aspart (novoLOG) injection 0-5 Units  0-5 Units Subcutaneous QHS Christiane Ha, MD      . DISCONTD: insulin aspart (novoLOG) injection 0-9 Units  0-9 Units Subcutaneous TID WC Christiane Ha, MD   1 Units at 01/02/12 0804  . DISCONTD: insulin regular (NOVOLIN R,HUMULIN R) 100 units/mL injection 6 Units  6  Units Intravenous Once Christiane Ha, MD      . DISCONTD: LORazepam (ATIVAN) injection 0-4 mg  0-4 mg Intravenous Q6H Christiane Ha, MD   2 mg at 12/30/11 1125  . DISCONTD: LORazepam (ATIVAN) injection 0-4 mg  0-4 mg Intravenous Q12H Christiane Ha, MD      . DISCONTD: LORazepam (ATIVAN) injection 1 mg  1 mg Intravenous Q6H PRN Christiane Ha, MD   1 mg at 12/30/11 0859  . DISCONTD: LORazepam (ATIVAN) tablet 1 mg  1 mg Oral Q6H PRN Christiane Ha, MD      . DISCONTD: magnesium sulfate (IV Push/IM) injection 2 g  2 g Intravenous Once Vania Rea, MD      . DISCONTD: pantoprazole (PROTONIX) injection 40 mg  40 mg Intravenous Q24H Christiane Ha, MD   40 mg at 01/01/12 1326  . DISCONTD: sodium chloride 0.9 % with calcium gluconate ADS Med           . DISCONTD: sodium polystyrene (KAYEXALATE) 15 GM/60ML suspension 30 g  30 g Oral Once Jamse Mead, MD      . DISCONTD: thiamine (B-1) injection 100 mg  100 mg Intravenous Daily Christiane Ha,  MD   100 mg at 01/01/12 1000   No current outpatient prescriptions on file as of 01/02/2012.    OB/GYN Status:  No LMP for male patient.  General Assessment Data Location of Assessment: [facility] ([facility]) ACT Assessment: Yes Living Arrangements: Spouse/significant other Can pt return to current living arrangement?: Yes Admission Status: Voluntary Is patient capable of signing voluntary admission?: Yes Transfer from: Acute Hospital Referral Source: Medical Floor Inpatient  Education Status Is patient currently in school?: No  Risk to self Suicidal Ideation: No Suicidal Intent: No Is patient at risk for suicide?: No Suicidal Plan?: No Access to Means: No What has been your use of drugs/alcohol within the last 12 months?: etoh Previous Attempts/Gestures: No Triggers for Past Attempts: None known Intentional Self Injurious Behavior: None Family Suicide History: No Recent stressful life event(s): Financial  Problems;Recent negative physical changes Persecutory voices/beliefs?: No Depression: No Substance abuse history and/or treatment for substance abuse?: Yes Suicide prevention information given to non-admitted patients: Yes  Risk to Others Homicidal Ideation: No Thoughts of Harm to Others: No Current Homicidal Intent: No Current Homicidal Plan: No Access to Homicidal Means: No History of harm to others?: No Assessment of Violence: None Noted Does patient have access to weapons?: No Criminal Charges Pending?: No Does patient have a court date: No  Psychosis Hallucinations: None noted Delusions: None noted  Mental Status Report Appear/Hygiene: Improved Eye Contact: Good Motor Activity: Restlessness Speech: Soft;Slow Level of Consciousness: Alert Mood: Depressed;Anxious Affect: Blunted Anxiety Level: None Thought Processes: Coherent Judgement: Unimpaired Orientation: Person;Place;Time Obsessive Compulsive Thoughts/Behaviors: None  Cognitive Functioning Concentration: Decreased Memory: Recent Intact;Remote Intact IQ: Average Insight: Poor Impulse Control: Poor Appetite: Good Sleep: No Change Vegetative Symptoms: None  Prior Inpatient Therapy Prior Inpatient Therapy: No  Prior Outpatient Therapy Prior Outpatient Therapy: Yes Prior Therapy Facilty/Provider(s): attended Substance abuse classes in the community Reason for Treatment: educatioonal  ADL Screening (condition at time of admission) Patient's cognitive ability adequate to safely complete daily activities?: Yes Patient able to express need for assistance with ADLs?: Yes Independently performs ADLs?: Yes Weakness of Legs: Both Weakness of Arms/Hands: Right  Home Assistive Devices/Equipment Home Assistive Devices/Equipment: None  Therapy Consults (therapy consults require a physician order) PT Evaluation Needed: No OT Evalulation Needed: No SLP Evaluation Needed: No Abuse/Neglect Assessment  (Assessment to be complete while patient is alone) Physical Abuse: Denies Verbal Abuse: Denies Sexual Abuse: Denies Exploitation of patient/patient's resources: Denies Self-Neglect: Denies Values / Beliefs Cultural Requests During Hospitalization: None Spiritual Requests During Hospitalization: None Consults Spiritual Care Consult Needed: No Social Work Consult Needed: No Merchant navy officer (For Healthcare) Advance Directive: Patient does not have advance directive;Patient would not like information Pre-existing out of facility DNR order (yellow form or pink MOST form): No Nutrition Screen Diet: Clear liquid Unintentional weight loss greater than 10lbs within the last month: No Problems chewing or swallowing foods and/or liquids: No Home Tube Feeding or Total Parenteral Nutrition (TPN): No Patient appears severely malnourished: No  Additional Information 1:1 In Past 12 Months?: No CIRT Risk: No Elopement Risk: No Does patient have medical clearance?: No     Disposition:Patient refused referral to a substance abuse treatment service. He is willing to go to [facility] He will remain on the medical floor until medically stable.  Disposition Disposition of Patient: Treatment offered and refused Type of treatment offered and refused: Out-patient (patient feels he can maintain sobriety on his own)  On Site Evaluation by:   Reviewed with Physician:  Jake Shark [facility] 01/02/2012 3:35 PM

## 2012-01-02 NOTE — Progress Notes (Signed)
CRITICAL VALUE ALERT  Critical value received:  Na=126 and Ca=5.2  Date of notification:  01/02/12  Time of notification:  0111  Critical value read back: yes  Nurse who received alert:  R.Ardelle Anton, RN  MD notified (1st page):  Orvan Falconer  Time of first page: 0115  MD notified (2nd page):  Time of second page:  Responding MD:  Orvan Falconer  Time MD responded:  [phone]

## 2012-01-02 NOTE — Progress Notes (Signed)
UR Chart Review Completed  

## 2012-01-02 NOTE — Progress Notes (Signed)
Peter [name]  MRN: [id number]  DOB/AGE: [birth year] 41 y.o.  Primary Care Physician:Peter [name], MD, MD  Admit date: 12/29/2011  Chief Complaint:  Chief Complaint  Patient presents with  . Abdominal Pain  . Emesis  . Diarrhea    S-Pt presented on  12/29/2011 with  Chief Complaint  Patient presents with  . Abdominal Pain  . Emesis  . Diarrhea  .    Pt today feels better      . cloNIDine  0.1 mg Oral BID  . heparin subcutaneous  5,000 Units Subcutaneous Q8H  . imipenem-cilastatin  500 mg Intravenous Q8H  . sodium chloride  10-40 mL Intracatheter Q12H  . sodium chloride      . sodium chloride      . thiamine  100 mg Oral Daily  . DISCONTD: furosemide  100 mg Intravenous BID  . DISCONTD: imipenem-cilastatin  250 mg Intravenous Q6H  . DISCONTD: insulin aspart  0-5 Units Subcutaneous QHS  . DISCONTD: insulin aspart  0-9 Units Subcutaneous TID WC  . DISCONTD: pantoprazole (PROTONIX) IV  40 mg Intravenous Q24H  . DISCONTD: sodium polystyrene  30 g Oral Once  . DISCONTD: thiamine  100 mg Intravenous Daily         UXL:KGMWN from the symptoms mentioned above,there are no other symptoms referable to all systems reviewed.  Physical Exam: Vital signs in last 24 hours: Temp:  [98.7 F (37.1 C)-99.9 F (37.7 C)] 99.3 F (37.4 C) (03/27 1200) Pulse Rate:  [33-136] 130  (03/27 1500) Resp:  [15-29] 24  (03/27 1537) BP: (119-163)/(79-145) 136/87 mmHg (03/27 1400) SpO2:  [83 %-99 %] 95 % (03/27 1500) Weight:  [251 lb 5.2 oz (114 kg)] 251 lb 5.2 oz (114 kg) (03/27 0500) Weight change: -10.6 oz (-0.3 kg) Last BM Date: 01/01/12  Intake/Output from previous day: 03/26 0701 - 03/27 0700 In: 3520 [P.O.:1200; I.V.:1800; IV Piggyback:520] Out: 5000 [Urine:5000] Total I/O In: 1162 [P.O.:400; I.V.:600; IV Piggyback:162] Out: 1400 [Urine:1400]   Physical Exam: General- pt is awake,alert, oriented to time place and person Resp  Decreased sounds at bases NO  Distress CVS- S1S2 regular in rate and rhythm GIT- BS+decreased ,distended EXT- NO LE Edema, Cyanosis   Lab Results: CBC  Basename 01/02/12 0425 12/31/11 0432  WBC 7.8 12.2*  HGB 9.0* 15.1  HCT 26.7* 40.0  PLT 103* 160    BMET  Basename 01/02/12 0001 01/01/12 0436  NA 126* 135  K 4.4 5.1  CL 99 104  CO2 19 17*  GLUCOSE 141* 139*  BUN 42* 45*  CREATININE 3.73* 4.53*  CALCIUM 5.2* 5.0*   Trend Creat  5.45==>4.53==>3.73 Calcium 4.8==>5.0==>5.2 Alb 2.6  Sodium 135==>126  MICRO Recent Results (from the past 240 hour(s))  CULTURE, BLOOD (ROUTINE X 2)     Status: Normal (Preliminary result)   Collection Time   12/30/11  1:45 PM      Component Value Range Status Comment   Specimen Description BLOOD LEFT HAND   Final    Special Requests BOTTLES DRAWN AEROBIC ONLY 4CC ONE BOTTLE   Final    Culture NO GROWTH 3 DAYS   Final    Report Status PENDING   Incomplete   MRSA PCR SCREENING     Status: Normal   Collection Time   12/30/11  1:49 PM      Component Value Range Status Comment   MRSA by PCR NEGATIVE  NEGATIVE  Final   CULTURE, BLOOD (ROUTINE X 2)  Status: Normal (Preliminary result)   Collection Time   12/30/11  2:00 PM      Component Value Range Status Comment   Specimen Description BLOOD LEFT ARM   Final    Special Requests     Final    Value: BOTTLES DRAWN AEROBIC AND ANAEROBIC 4CC EACH BOTTLE   Culture NO GROWTH 3 DAYS   Final    Report Status PENDING   Incomplete   URINE CULTURE     Status: Normal   Collection Time   12/30/11  3:10 PM      Component Value Range Status Comment   Specimen Description URINE, CATHETERIZED   Final    Special Requests NONE   Final    Culture  Setup Time [id number]   Final    Colony Count NO GROWTH   Final    Culture NO GROWTH   Final    Report Status 12/31/2011 FINAL   Final       Lab Results  Component Value Date   CALCIUM 5.2* 01/02/2012   CAION 0.65* 12/30/2011   PHOS 3.2 01/01/2012       Impression: 1)Renal   AKI secondary to Contrast Induced ATN  With some contribution from SIRS as Pancreatitits                AKI now improving                Creat now is trending down   2)HTN  BP at goal              In SIRS state -will not aim for 120's   3)Anemia HGb at goal (9--11)                  Rapid fall 15==>9                  BUt Pt is also Positive by > 10 Liters approx + SIRS state                   Dilutional   4)GI- Admitted with Pancreatitis  5)Hypocalcium- Secondary to asso with Fats after Pancreatitis'  6)HypoNatremia True or Pseudo                             ? Very high triGlycerides in past   7)Acid base Co2 at goal    Plan:  Will check TGL again  will do basic work up for Hyponatremia Will give Calcium IV Will check Ionized calcium in am      [name] 01/02/2012, 4:10 PM

## 2012-01-03 ENCOUNTER — Inpatient Hospital Stay (HOSPITAL_COMMUNITY): Payer: Self-pay

## 2012-01-03 ENCOUNTER — Encounter (HOSPITAL_COMMUNITY): Payer: Self-pay | Admitting: Internal Medicine

## 2012-01-03 DIAGNOSIS — R062 Wheezing: Secondary | ICD-10-CM | POA: Diagnosis not present

## 2012-01-03 DIAGNOSIS — K567 Ileus, unspecified: Secondary | ICD-10-CM

## 2012-01-03 DIAGNOSIS — D649 Anemia, unspecified: Secondary | ICD-10-CM | POA: Diagnosis not present

## 2012-01-03 HISTORY — DX: Ileus, unspecified: K56.7

## 2012-01-03 LAB — BLOOD GAS, ARTERIAL
Acid-base deficit: 4 mmol/L — ABNORMAL HIGH (ref 0.0–2.0)
TCO2: 20.2 mmol/L (ref 0–100)
pCO2 arterial: 42.9 mmHg (ref 35.0–45.0)
pO2, Arterial: 82.3 mmHg (ref 80.0–100.0)

## 2012-01-03 LAB — COMPREHENSIVE METABOLIC PANEL
ALT: 27 U/L (ref 0–53)
Alkaline Phosphatase: 58 U/L (ref 39–117)
BUN: 38 mg/dL — ABNORMAL HIGH (ref 6–23)
CO2: 21 mEq/L (ref 19–32)
GFR calc Af Amer: 31 mL/min — ABNORMAL LOW (ref >90)
GFR calc non Af Amer: 27 mL/min — ABNORMAL LOW (ref >90)
Glucose, Bld: 130 mg/dL — ABNORMAL HIGH (ref 70–99)
Potassium: 4.9 mEq/L (ref 3.5–5.1)
Sodium: 126 mEq/L — ABNORMAL LOW (ref 135–145)
Total Bilirubin: 1.1 mg/dL (ref 0.3–1.2)

## 2012-01-03 LAB — CBC
MCV: 84.9 fL (ref 78.0–100.0)
Platelets: 101 10*3/uL — ABNORMAL LOW (ref 150–400)
RBC: 2.51 MIL/uL — ABNORMAL LOW (ref 4.22–5.81)
RDW: 16 % — ABNORMAL HIGH (ref 11.5–15.5)
WBC: 8.3 10*3/uL (ref 4.0–10.5)

## 2012-01-03 LAB — CALCIUM, IONIZED: Calcium, Ion: 0.77 mmol/L — ABNORMAL LOW (ref 1.12–1.32)

## 2012-01-03 LAB — MAGNESIUM: Magnesium: 1.9 mg/dL (ref 1.5–2.5)

## 2012-01-03 LAB — OSMOLALITY, URINE: Osmolality, Ur: 483 mOsm/kg (ref 390–1090)

## 2012-01-03 LAB — RETICULOCYTES: Retic Count, Absolute: 57.7 10*3/uL (ref 19.0–186.0)

## 2012-01-03 LAB — LIPASE, BLOOD: Lipase: 131 U/L — ABNORMAL HIGH (ref 11–59)

## 2012-01-03 MED ORDER — BOOST / RESOURCE BREEZE PO LIQD
1.0000 | Freq: Three times a day (TID) | ORAL | Status: DC
  Administered 2012-01-03: 1 via ORAL
  Filled 2012-01-03 (×14): qty 1

## 2012-01-03 MED ORDER — SENNOSIDES-DOCUSATE SODIUM 8.6-50 MG PO TABS
2.0000 | ORAL_TABLET | Freq: Every day | ORAL | Status: DC
  Administered 2012-01-03 – 2012-01-10 (×8): 2 via ORAL
  Filled 2012-01-03 (×8): qty 2

## 2012-01-03 MED ORDER — POLYETHYLENE GLYCOL 3350 17 G PO PACK
17.0000 g | PACK | Freq: Every day | ORAL | Status: DC
  Administered 2012-01-03 – 2012-01-04 (×2): 17 g via ORAL
  Filled 2012-01-03 (×2): qty 1

## 2012-01-03 MED ORDER — LEVALBUTEROL HCL 0.63 MG/3ML IN NEBU
0.6300 mg | INHALATION_SOLUTION | Freq: Four times a day (QID) | RESPIRATORY_TRACT | Status: DC
  Administered 2012-01-03 – 2012-01-11 (×32): 0.63 mg via RESPIRATORY_TRACT
  Filled 2012-01-03 (×31): qty 3

## 2012-01-03 MED ORDER — LEVALBUTEROL HCL 0.63 MG/3ML IN NEBU
INHALATION_SOLUTION | RESPIRATORY_TRACT | Status: AC
  Filled 2012-01-03: qty 3

## 2012-01-03 MED ORDER — ALBUTEROL SULFATE (5 MG/ML) 0.5% IN NEBU
2.5000 mg | INHALATION_SOLUTION | Freq: Four times a day (QID) | RESPIRATORY_TRACT | Status: DC
  Filled 2012-01-03: qty 0.5

## 2012-01-03 MED ORDER — MAGNESIUM SULFATE IN D5W 10-5 MG/ML-% IV SOLN
1.0000 g | Freq: Once | INTRAVENOUS | Status: AC
  Administered 2012-01-03: 1 g via INTRAVENOUS
  Filled 2012-01-03: qty 100

## 2012-01-03 MED ORDER — HYDROMORPHONE HCL PF 1 MG/ML IJ SOLN
1.0000 mg | INTRAMUSCULAR | Status: DC | PRN
  Administered 2012-01-03 – 2012-01-11 (×57): 1 mg via INTRAVENOUS
  Filled 2012-01-03 (×60): qty 1

## 2012-01-03 MED ORDER — DEXTROSE 5 % IV SOLN: 1.0000 g | Freq: Once | INTRAVENOUS | Status: DC

## 2012-01-03 MED ORDER — MAGNESIUM SULFATE 40 MG/ML IJ SOLN
1.0000 g | Freq: Once | INTRAMUSCULAR | Status: AC
Start: 2012-01-03 — End: 2012-01-04
  Administered 2012-01-04: 1 g via INTRAVENOUS
  Filled 2012-01-03: qty 50

## 2012-01-03 NOTE — Progress Notes (Signed)
Radiology called regarding results of abdominal xray - worsening ileus vs small bowel obstruction.  Paged Dr. Sherrie Mustache with results.

## 2012-01-03 NOTE — Progress Notes (Signed)
Subjective: The patient is sitting up in a chair. He complains of moderate abdominal pain. He has nausea but no vomiting. He says his last bowel movement was a couple days ago. He has some shortness of breath but no chest pain.  Objective: Vital signs in last 24 hours: Filed Vitals:   01/03/12 0300 01/03/12 0400 01/03/12 0500 01/03/12 0600  BP: 132/79 123/67 137/91 136/78  Pulse: 125 117 125 118  Temp:      TempSrc:      Resp: 23 12 27    Height:      Weight: 119.8 kg (264 lb 1.8 oz)     SpO2: 94% 96% 93% 96%    Intake/Output Summary (Last 24 hours) at 01/03/12 1610 Last data filed at 01/03/12 9604  Gross per 24 hour  Intake   1990 ml  Output   2650 ml  Net   -660 ml    Weight change: 5.8 kg (12 lb 12.6 oz) Physical exam: General: 41 year old obese African recommend sitting up in bed, in no acute distress. Lungs: A few upper airway wheezes, otherwise decreased breath sounds globally. Breathing is mildly tachypneic. Heart: S1, S2, with tachycardia. Abdomen: Mildly to moderately distended, hypoactive bowel sounds, mildly to moderately tender in the epigastrium. Tympanic. Extremities: 1+ bilateral pedal edema.  Lab Results: Basic Metabolic Panel:  Basename 01/03/12 0327 01/02/12 0001 01/01/12 0617 01/01/12 0436  NA 126* 126* -- --  K 4.9 4.4 -- --  CL 97 99 -- --  CO2 21 19 -- --  GLUCOSE 130* 141* -- --  BUN 38* 42* -- --  CREATININE 2.75* 3.73* -- --  CALCIUM 5.8* 5.2* -- --  MG -- -- 1.5 --  PHOS -- -- -- 3.2   Liver Function Tests:  Basename 01/03/12 0327 01/02/12 0001  AST 97* 117*  ALT 27 37  ALKPHOS 58 68  BILITOT 1.1 1.3*  PROT 6.0 6.3  ALBUMIN 2.4* 2.6*    Basename 01/03/12 0327 01/02/12 0425  LIPASE 131* 208*  AMYLASE -- --   No results found for this basename: AMMONIA:2 in the last 72 hours CBC:  Basename 01/03/12 0327 01/02/12 0425  WBC -- 7.8  NEUTROABS -- --  HGB 7.4* 9.0*  HCT 22.5* 26.7*  MCV -- 84.5  PLT -- 103*   Cardiac  Enzymes:  Basename 01/03/12 0327 01/02/12 0425 01/01/12 0436  CKTOTAL 3464* 4524* 4515*  CKMB -- -- --  CKMBINDEX -- -- --  TROPONINI -- -- --   BNP: No results found for this basename: PROBNP:3 in the last 72 hours D-Dimer: No results found for this basename: DDIMER:2 in the last 72 hours CBG:  Basename 01/02/12 0744 01/01/12 2246 01/01/12 1612 01/01/12 1207 01/01/12 0754 01/01/12 0448  GLUCAP 127* 128* 124* 169* 133* 140*   Hemoglobin A1C:  Basename 12/31/11 0907  HGBA1C 6.0*   Fasting Lipid Panel:  Basename 01/02/12 0001  CHOL --  HDL --  LDLCALC --  TRIG 1178*  CHOLHDL --  LDLDIRECT --   Thyroid Function Tests: No results found for this basename: TSH,T4TOTAL,FREET4,T3FREE,THYROIDAB in the last 72 hours Anemia Panel: No results found for this basename: VITAMINB12,FOLATE,FERRITIN,TIBC,IRON,RETICCTPCT in the last 72 hours Coagulation: No results found for this basename: LABPROT:2,INR:2 in the last 72 hours Urine Drug Screen: Drugs of Abuse     Component Value Date/Time   LABOPIA POSITIVE* 12/30/2011 1433   COCAINSCRNUR NONE DETECTED 12/30/2011 1433   LABBENZ NONE DETECTED 12/30/2011 1433   AMPHETMU NONE DETECTED 12/30/2011 1433  THCU NONE DETECTED 12/30/2011 1433   LABBARB NONE DETECTED 12/30/2011 1433    Alcohol Level: No results found for this basename: ETH:2 in the last 72 hours Urinalysis: No results found for this basename: COLORURINE:2,APPERANCEUR:2,LABSPEC:2,PHURINE:2,GLUCOSEU:2,HGBUR:2,BILIRUBINUR:2,KETONESUR:2,PROTEINUR:2,UROBILINOGEN:2,NITRITE:2,LEUKOCYTESUR:2 in the last 72 hours Misc. Labs:   Micro: Recent Results (from the past 240 hour(s))  CULTURE, BLOOD (ROUTINE X 2)     Status: Normal (Preliminary result)   Collection Time   12/30/11  1:45 PM      Component Value Range Status Comment   Specimen Description BLOOD LEFT HAND   Final    Special Requests BOTTLES DRAWN AEROBIC ONLY 4CC ONE BOTTLE   Final    Culture NO GROWTH 3 DAYS   Final     Report Status PENDING   Incomplete   MRSA PCR SCREENING     Status: Normal   Collection Time   12/30/11  1:49 PM      Component Value Range Status Comment   MRSA by PCR NEGATIVE  NEGATIVE  Final   CULTURE, BLOOD (ROUTINE X 2)     Status: Normal (Preliminary result)   Collection Time   12/30/11  2:00 PM      Component Value Range Status Comment   Specimen Description BLOOD LEFT ARM   Final    Special Requests     Final    Value: BOTTLES DRAWN AEROBIC AND ANAEROBIC 4CC EACH BOTTLE   Culture NO GROWTH 3 DAYS   Final    Report Status PENDING   Incomplete   URINE CULTURE     Status: Normal   Collection Time   12/30/11  3:10 PM      Component Value Range Status Comment   Specimen Description URINE, CATHETERIZED   Final    Special Requests NONE   Final    Culture  Setup Time [id number]   Final    Colony Count NO GROWTH   Final    Culture NO GROWTH   Final    Report Status 12/31/2011 FINAL   Final     Studies/Results: No results found.  Medications: I have reviewed the patient's current medications.  Assessment: Principal Problem:  *Acute pancreatitis Active Problems:  HTN (hypertension)  Alcohol abuse  Hepatic steatosis  Alcohol withdrawal  ARF (acute renal failure)  Hyperkalemia  Metabolic acidosis  Hypocalcemia  Fever  Hypertriglyceridemia  Rhabdomyolysis  Sepsis  UTI (urinary tract infection)  Ileus  Anemia  Wheezing   1. Acute pancreatitis secondary to alcohol abuse and hypertriglyceridemia. His lipase on admission was 1273. Today it is 131. The patient was advised to stop drinking permanently.  Alcoholic hepatitis. His ALT and bilirubin have normalize. His AST has improved but is still elevated.  Alcohol abuse and resolving alcoholic withdrawal syndrome. The patient was advised to stop drinking permanently. He is on vitamin therapy and when necessary Ativan.  Sepsis syndrome versus Sirs secondary to acute pancreatitis. His fever has resolved. His cultures are  negative to date. He continues on Primaxin.  Probable ileus. The abdominal x-ray from March 25 noted.  Worsening anemia. This is in part secondary to hemodilution and acute renal failure. No obvious GI or GU bleeding noted. He will be typed and screened for packed red blood cells. We'll continue to follow his hemoglobin and hematocrit.  Hypertriglyceridemia. Will start Lopid when his LFTs and clinical course have improved.  Rhabdomyolysis. His CK is still in the thousands but improved from admission.  Acute renal failure. Likely secondary to prerenal azotemia and dye  nephropathy. Dr. Kristian Covey is following. The patient continues on IV fluids. He did receive IV Lasix for a couple days. Now discontinued. His in's and outs have been positive more than 8 L over the hospitalization. I wonder if the patient still needs Lasix. He does have some mild peripheral edema. This will be discussed with nephrology.  Hyponatremia.  Resolved hyperkalemia. Resolved metabolic acidosis.  Hypocalcemia. He is receiving and has received IV calcium per nephrology.  Hypertension and associated tachycardia. He is on clonidine.  Wheezing. Albuterol was discontinued in favor of Xopenex due to the tachycardia.   Plan:  1. Continue to follow his renal function and electrolytes. Continue to monitor his CK. Continue to monitor lipase. Advance diet when he is less nauseated and less distended. 2. We'll guaiac his stools and check an anemia panel. We'll type and screen 2 units of packed red blood cells and hold. We'll check another CBC this afternoon. 3. Will add MiraLAX and Senokot S. for laxative therapy. We'll order another abdominal x-ray and chest x-ray. 4. Social worker consult for alcohol cessation.    LOS: 5 days   Ezella Kell 01/03/2012, 8:32 AM

## 2012-01-03 NOTE — Progress Notes (Signed)
CARE MANAGEMENT NOTE 01/03/2012  Patient:  Peter [name], Peter [name]   Account Number:  [id number]  Date Initiated:  01/03/2012  Documentation initiated by:  Rosemary Holms  Subjective/Objective Assessment:   pt admitted with abdominal pain, elevated lipase,  ETOH abuse dehydration.     Action/Plan:   Will continue to follow.   Anticipated DC Date:  01/07/2012   Anticipated DC Plan:  HOME/SELF CARE  In-house referral  Financial Counselor      DC Planning Services  CM consult      Choice offered to / List presented to:             Status of service:  In process, will continue to follow Medicare Important Message given?   (If response is "NO", the following Medicare IM given date fields will be blank) Date Medicare IM given:   Date Additional Medicare IM given:    Discharge Disposition:  HOME/SELF CARE  Per UR Regulation:    If discussed at Long Length of Stay Meetings, dates discussed:   01/03/2012    Comments:  01/03/12 1000 Grasiela Jonsson Leanord Hawking RN BSN CM

## 2012-01-03 NOTE — Progress Notes (Signed)
Subjective: Interval History: has no complaint of nausea or vomiting. Patient says that he's feeling better. He denies also any her abdominal pain..  Objective: Vital signs in last 24 hours: Temp:  [98.1 F (36.7 C)-99.3 F (37.4 C)] 98.3 F (36.8 C) (03/27 2339) Pulse Rate:  [33-132] 118  (03/28 0600) Resp:  [12-30] 27  (03/28 0500) BP: (119-150)/(67-109) 136/78 mmHg (03/28 0600) SpO2:  [90 %-99 %] 96 % (03/28 0600) Weight:  [119.8 kg (264 lb 1.8 oz)] 119.8 kg (264 lb 1.8 oz) (03/28 0300) Weight change: 5.8 kg (12 lb 12.6 oz)  Intake/Output from previous day: 03/27 0701 - 03/28 0700 In: 2327 [P.O.:400; I.V.:1765; IV Piggyback:162] Out: 2650 [Urine:2650] Intake/Output this shift:    General appearance: alert, cooperative and no distress Resp: clear to auscultation bilaterally Cardio: regular rate and rhythm, S1, S2 normal, no murmur, click, rub or gallop GI: Abdomen is distended, tympanic presently nontender. He does have also any abdominal tenderness. He has positive bowel sound. Extremities: edema Trace to 1+ edema.  Lab Results:  Basename 01/03/12 0327 01/02/12 0425  WBC -- 7.8  HGB 7.4* 9.0*  HCT 22.5* 26.7*  PLT -- 103*   BMET:  Basename 01/03/12 0327 01/02/12 0001  NA 126* 126*  K 4.9 4.4  CL 97 99  CO2 21 19  GLUCOSE 130* 141*  BUN 38* 42*  CREATININE 2.75* 3.73*  CALCIUM 5.8* 5.2*   No results found for this basename: PTH:2 in the last 72 hours Iron Studies: No results found for this basename: IRON,TIBC,TRANSFERRIN,FERRITIN in the last 72 hours  Studies/Results: No results found.  I have reviewed the patient's current medications.  Assessment/Plan: Problem #1 acute kidney injury his BUN and creatinine at this moment seems to be improving BUN is 8 creatinine is 2.75. Patient presently none oliguric. Problem #2 hyponatremia possibly secondary to intravascular volume depletion with hypotonic fluid replacement as well as 1.6 stable Problem #3 history of  her anemia his hemoglobin and hematocrit seems to be declining.   Problem #4 history of rhabdomyolysis CPK seems to be improving. Problem #5 history of acute pancreatitis presently is a symptomatic he is tolerating liquid food. Problem #6 hypocalcemia calcium very slowly improving. Problem #7 history of metabolic acidosis that is corrected  Problem #8 history of hypertension his blood pressure seems to control very well. Plan: We'll continue his present management           We'll follow his blood work           May need to check a stool for guaiac           Iron studies in the morning and basic metabolic panel and H&H.   LOS: 5 days   Dylann Layne S 01/03/2012,7:20 AM

## 2012-01-03 NOTE — Progress Notes (Signed)
CRITICAL VALUE ALERT  Critical value received: CALCIUM =5.8 Date of notification:  01/03/12  Time of notification:0553  Critical value read backYES  Nurse who received alert: BOB Melaine Mcphee    MD notified (1st page): DAVID  Time of first page: 0555  MD notified (2nd page):  Time of second page:  Responding MD: DAVID  Time MD responded: [phone]

## 2012-01-04 ENCOUNTER — Inpatient Hospital Stay (HOSPITAL_COMMUNITY): Payer: Self-pay

## 2012-01-04 ENCOUNTER — Encounter (HOSPITAL_COMMUNITY): Payer: Self-pay | Admitting: Internal Medicine

## 2012-01-04 DIAGNOSIS — K859 Acute pancreatitis without necrosis or infection, unspecified: Secondary | ICD-10-CM

## 2012-01-04 DIAGNOSIS — E8809 Other disorders of plasma-protein metabolism, not elsewhere classified: Secondary | ICD-10-CM | POA: Diagnosis present

## 2012-01-04 DIAGNOSIS — R58 Hemorrhage, not elsewhere classified: Secondary | ICD-10-CM

## 2012-01-04 DIAGNOSIS — R609 Edema, unspecified: Secondary | ICD-10-CM | POA: Diagnosis not present

## 2012-01-04 HISTORY — DX: Hemorrhage, not elsewhere classified: R58

## 2012-01-04 LAB — CBC
HCT: 20.3 % — ABNORMAL LOW (ref 39.0–52.0)
HCT: 24.3 % — ABNORMAL LOW (ref 39.0–52.0)
HCT: 23.7 % — ABNORMAL LOW (ref 39.0–52.0)
Hemoglobin: 6.7 g/dL — CL (ref 13.0–17.0)
Hemoglobin: 8.3 g/dL — ABNORMAL LOW (ref 13.0–17.0)
Hemoglobin: 8.1 g/dL — ABNORMAL LOW (ref 13.0–17.0)
MCH: 28.8 pg (ref 26.0–34.0)
MCHC: 34.2 g/dL (ref 30.0–36.0)
MCV: 86 fL (ref 78.0–100.0)
MCV: 84.1 fL (ref 78.0–100.0)
MCV: 84.3 fL (ref 78.0–100.0)
RBC: 2.36 MIL/uL — ABNORMAL LOW (ref 4.22–5.81)
RBC: 2.89 MIL/uL — ABNORMAL LOW (ref 4.22–5.81)
RBC: 2.81 MIL/uL — ABNORMAL LOW (ref 4.22–5.81)
WBC: 9 10*3/uL (ref 4.0–10.5)
WBC: 10.4 10*3/uL (ref 4.0–10.5)

## 2012-01-04 LAB — IRON AND TIBC
Iron: 25 ug/dL — ABNORMAL LOW (ref 42–135)
TIBC: 165 ug/dL — ABNORMAL LOW (ref 215–435)

## 2012-01-04 LAB — BASIC METABOLIC PANEL
BUN: 26 mg/dL — ABNORMAL HIGH (ref 6–23)
CO2: 22 mEq/L (ref 19–32)
Chloride: 98 mEq/L (ref 96–112)
Creatinine, Ser: 2.08 mg/dL — ABNORMAL HIGH (ref 0.50–1.35)
Glucose, Bld: 123 mg/dL — ABNORMAL HIGH (ref 70–99)

## 2012-01-04 LAB — CK: Total CK: 3048 U/L — ABNORMAL HIGH (ref 7–232)

## 2012-01-04 LAB — PREPARE RBC (CROSSMATCH)

## 2012-01-04 LAB — FOLATE: Folate: 15.3 ng/mL

## 2012-01-04 LAB — VITAMIN B12: Vitamin B-12: 1188 pg/mL — ABNORMAL HIGH (ref 211–911)

## 2012-01-04 MED ORDER — PANTOPRAZOLE SODIUM 40 MG IV SOLR
40.0000 mg | Freq: Two times a day (BID) | INTRAVENOUS | Status: DC
  Administered 2012-01-04 – 2012-01-09 (×11): 40 mg via INTRAVENOUS
  Filled 2012-01-04 (×11): qty 40

## 2012-01-04 MED ORDER — SODIUM CHLORIDE 0.9 % IV SOLN
INTRAVENOUS | Status: DC
  Administered 2012-01-04 – 2012-01-06 (×2): via INTRAVENOUS
  Administered 2012-01-06: 1000 mL via INTRAVENOUS
  Administered 2012-01-07: 14:00:00 via INTRAVENOUS
  Administered 2012-01-08 – 2012-01-09 (×2): 1000 mL via INTRAVENOUS

## 2012-01-04 MED ORDER — FUROSEMIDE 10 MG/ML IJ SOLN
60.0000 mg | Freq: Two times a day (BID) | INTRAMUSCULAR | Status: DC
  Administered 2012-01-04 – 2012-01-11 (×15): 60 mg via INTRAVENOUS
  Filled 2012-01-04 (×14): qty 6

## 2012-01-04 MED ORDER — DIPHENHYDRAMINE HCL 50 MG/ML IJ SOLN: 25.0000 mg | Freq: Once | INTRAMUSCULAR | Status: AC

## 2012-01-04 MED ORDER — SODIUM CHLORIDE 0.9 % IJ SOLN
INTRAMUSCULAR | Status: AC
  Administered 2012-01-04: 11:00:00
  Filled 2012-01-04: qty 3

## 2012-01-04 MED ORDER — SODIUM CHLORIDE 0.9 % IJ SOLN
INTRAMUSCULAR | Status: AC
  Filled 2012-01-04: qty 3

## 2012-01-04 MED ORDER — FUROSEMIDE 10 MG/ML IJ SOLN
INTRAMUSCULAR | Status: AC
  Administered 2012-01-04: 60 mg via INTRAVENOUS
  Filled 2012-01-04: qty 8

## 2012-01-04 MED ORDER — TRACE MINERALS CR-CU-MN-SE-ZN 10-1000-500-60 MCG/ML IV SOLN
INTRAVENOUS | Status: AC
  Administered 2012-01-04: 17:00:00 via INTRAVENOUS
  Filled 2012-01-04: qty 2000

## 2012-01-04 MED ORDER — DIPHENHYDRAMINE HCL 25 MG PO CAPS
25.0000 mg | ORAL_CAPSULE | Freq: Once | ORAL | Status: AC
  Administered 2012-01-04: 25 mg via NASOGASTRIC

## 2012-01-04 MED ORDER — FUROSEMIDE 10 MG/ML IJ SOLN: 20.0000 mg | Freq: Once | INTRAMUSCULAR | Status: DC

## 2012-01-04 MED ORDER — PANTOPRAZOLE SODIUM 40 MG IV SOLR
40.0000 mg | INTRAVENOUS | Status: DC
  Administered 2012-01-04: 40 mg via INTRAVENOUS
  Filled 2012-01-04: qty 40

## 2012-01-04 MED ORDER — DIPHENHYDRAMINE HCL 25 MG PO CAPS
ORAL_CAPSULE | ORAL | Status: AC
  Administered 2012-01-04: 25 mg via NASOGASTRIC
  Filled 2012-01-04: qty 1

## 2012-01-04 NOTE — Progress Notes (Signed)
PT UP TO BSC FOR LARGE BROWN/GREEN LIQUID STOOL. NO BLOOD VISIBLE.Marland KitchenNG REMAINS TO LIS.

## 2012-01-04 NOTE — Progress Notes (Addendum)
Dr fields called after reviewing abdominal xray. RN instructed to advance ng tube 2cm. This was done. Pt tolerated well. Pt was not ambulated in hall today d/t severe weakness and sob w/ any exertion. Pt has been up to chair today, and bsc. Pt stands  At bedside on occasion to void.

## 2012-01-04 NOTE — Progress Notes (Signed)
Critical received. Hgb 6.7. Notified Dr. Onalee Hua, no new orders received at the time.

## 2012-01-04 NOTE — Consult Note (Addendum)
PARENTERAL NUTRITION CONSULT NOTE - INITIAL  Pharmacy Consult for TPN Indication: severe pancreatitis  Allergies  Allergen Reactions  . Ace Inhibitors Swelling    Lip swelling   Patient Measurements: Height: 5\' 10"  (177.8 cm) Weight: 264 lb 1.8 oz (119.8 kg) IBW/kg (Calculated) : 73  Adjusted Body Weight: 87KG  Vital Signs: Temp: 98.5 F (36.9 C) (03/29 1130) Temp src: Oral (03/29 0800) BP: 125/70 mmHg (03/29 1100) Pulse Rate: 125  (03/29 1130) Intake/Output from previous day: 03/28 0701 - 03/29 0700 In: 3054 [P.O.:560; I.V.:1865; IV Piggyback:629] Out: 1975 [Urine:1875; Emesis/NG output:100] Intake/Output from this shift: Total I/O In: 818.5 [I.V.:800; Blood:12.5; IV Piggyback:6] Out: 1000 [Urine:1000]  Labs:  [facility] 01/04/12 0455 01/03/12 1456 01/03/12 0327 01/02/12 0425  WBC 9.0 8.3 -- 7.8  HGB 6.7* 7.1* 7.4* --  HCT 20.3* 21.3* 22.5* --  PLT 130* 101* -- 103*  APTT -- -- -- --  INR -- -- -- --     Basename 01/04/12 0455 01/03/12 2302 01/03/12 0327 01/02/12 1944 01/02/12 0001  NA 129* -- 126* -- 126*  K 4.6 -- 4.9 -- 4.4  CL 98 -- 97 -- 99  CO2 22 -- 21 -- 19  GLUCOSE 123* -- 130* -- 141*  BUN 26* -- 38* -- 42*  CREATININE 2.08* -- 2.75* -- 3.73*  LABCREA -- -- -- 202.97 --  CREAT24HRUR -- -- -- -- --  CALCIUM 6.6* -- 5.8* -- 5.2*  MG -- 1.9 -- -- --  PHOS -- -- -- -- --  PROT -- -- 6.0 -- 6.3  ALBUMIN -- -- 2.4* -- 2.6*  AST -- -- 97* -- 117*  ALT -- -- 27 -- 37  ALKPHOS -- -- 58 -- 68  BILITOT -- -- 1.1 -- 1.3*  BILIDIR -- -- -- -- --  IBILI -- -- -- -- --  PREALBUMIN -- -- -- -- --  TRIG -- -- -- -- 1178*  CHOLHDL -- -- -- -- --  CHOL -- -- -- -- --   Estimated Creatinine Clearance: 59.4 ml/min (by C-G formula based on Cr of 2.08).    Basename 01/02/12 0744 01/01/12 2246 01/01/12 1612  GLUCAP 127* 128* 124*   Medical History: Past Medical History  Diagnosis Date  . Hypertension   . Ileus 01/03/2012   Medications:  Scheduled:     . cloNIDine  0.1 mg Oral BID  . diphenhydrAMINE  25 mg Per NG tube Once   Or  . diphenhydrAMINE  25 mg Intramuscular Once  . feeding supplement  1 Container Oral TID BM  . furosemide  60 mg Intravenous BID  . heparin subcutaneous  5,000 Units Subcutaneous Q8H  . imipenem-cilastatin  500 mg Intravenous Q8H  . levalbuterol      . levalbuterol  0.63 mg Nebulization Q6H  . magnesium sulfate 1 - 4 g bolus IVPB  1 g Intravenous Once  . magnesium sulfate 1 - 4 g bolus IVPB  1 g Intravenous Once  . pantoprazole (PROTONIX) IV  40 mg Intravenous Q24H  . polyethylene glycol  17 g Oral Daily  . senna-docusate  2 tablet Oral QHS  . sodium chloride  10-40 mL Intracatheter Q12H  . thiamine  100 mg Oral Daily  . DISCONTD: furosemide  20 mg Intravenous Once  . DISCONTD: magnesium sulfate LVP 250-500 ml  1 g Intravenous Once    Insulin Requirements in the past 24 hours:  n/a  Current Nutrition:  none  Assessment: Obesity Severe pancreatitis Hypoalbuminemia  Hyponatremia hypocalcemia  Nutritional Goals:  2000-2100 kCal, 100-120 grams of protein per day  Plan: Initiate TPN at 61ml/hr today (Clinimix E 5/15) NO LIPID (FAT) INFUSION per Dr Darrick Penna (d/w MD) Adjust IVF's accordingly (normal saline) MVI and Trace Elements every Mon-Wed-Fri F/U Lytes, fluid status, renal fxn, glucose tolerance  Margo Aye, Donavin Audino A 01/04/2012,12:51 PM

## 2012-01-04 NOTE — Progress Notes (Signed)
Subjective: The patient is sitting up in a chair. He has no nausea with the NG tube in. No vomiting. He continues to have abdominal pain and a feeling of abdominal bloating. He has upper airway wheezes but no increasing shortness of breath.  Objective: Vital signs in last 24 hours: Filed Vitals:   01/04/12 0327 01/04/12 0400 01/04/12 0500 01/04/12 0600  BP: 138/77 130/87 139/86 128/85  Pulse:  122 125 122  Temp:      TempSrc:      Resp:  13 21 16   Height:      Weight:      SpO2:  98% 97% 100%    Intake/Output Summary (Last 24 hours) at 01/04/12 0754 Last data filed at 01/04/12 0600  Gross per 24 hour  Intake   2979 ml  Output   1975 ml  Net   1004 ml    Weight change:  Physical exam: General: 41 year old obese African recommend sitting up in bed, in no acute distress. Lungs: A few upper airway wheezes, otherwise decreased breath sounds globally. Heart: S1, S2, with tachycardia. Abdomen: Moderately distended, hypoactive bowel sounds, moderately tender diffusely. Tympanic. Extremities: 1+ bilateral pedal edema.  Lab Results: Basic Metabolic Panel:  Basename 01/04/12 0455 01/03/12 2302 01/03/12 0327  NA 129* -- 126*  K 4.6 -- 4.9  CL 98 -- 97  CO2 22 -- 21  GLUCOSE 123* -- 130*  BUN 26* -- 38*  CREATININE 2.08* -- 2.75*  CALCIUM 6.6* -- 5.8*  MG -- 1.9 --  PHOS -- -- --   Liver Function Tests:  Basename 01/03/12 0327 01/02/12 0001  AST 97* 117*  ALT 27 37  ALKPHOS 58 68  BILITOT 1.1 1.3*  PROT 6.0 6.3  ALBUMIN 2.4* 2.6*    Basename 01/04/12 0455 01/03/12 0327  LIPASE 113* 131*  AMYLASE -- --   No results found for this basename: AMMONIA:2 in the last 72 hours CBC:  Basename 01/04/12 0455 01/03/12 1456  WBC 9.0 8.3  NEUTROABS -- --  HGB 6.7* 7.1*  HCT 20.3* 21.3*  MCV 86.0 84.9  PLT 130* 101*   Cardiac Enzymes:  Basename 01/04/12 0455 01/03/12 0327 01/02/12 0425  CKTOTAL 3048* 3464* 4524*  CKMB -- -- --  CKMBINDEX -- -- --  TROPONINI -- --  --   BNP: No results found for this basename: PROBNP:3 in the last 72 hours D-Dimer: No results found for this basename: DDIMER:2 in the last 72 hours CBG:  Basename 01/02/12 0744 01/01/12 2246 01/01/12 1612 01/01/12 1207  GLUCAP 127* 128* 124* 169*   Hemoglobin A1C: No results found for this basename: HGBA1C in the last 72 hours Fasting Lipid Panel:  Basename 01/02/12 0001  CHOL --  HDL --  LDLCALC --  TRIG 1178*  CHOLHDL --  LDLDIRECT --   Thyroid Function Tests: No results found for this basename: TSH,T4TOTAL,FREET4,T3FREE,THYROIDAB in the last 72 hours Anemia Panel:  Basename 01/03/12 1456  VITAMINB12 1188*  FOLATE 15.3  FERRITIN 2243*  TIBC 165*  IRON 25*  RETICCTPCT 2.3   Coagulation: No results found for this basename: LABPROT:2,INR:2 in the last 72 hours Urine Drug Screen: Drugs of Abuse     Component Value Date/Time   LABOPIA POSITIVE* 12/30/2011 1433   COCAINSCRNUR NONE DETECTED 12/30/2011 1433   LABBENZ NONE DETECTED 12/30/2011 1433   AMPHETMU NONE DETECTED 12/30/2011 1433   THCU NONE DETECTED 12/30/2011 1433   LABBARB NONE DETECTED 12/30/2011 1433    Alcohol Level: No results found  for this basename: ETH:2 in the last 72 hours Urinalysis: No results found for this basename: COLORURINE:2,APPERANCEUR:2,LABSPEC:2,PHURINE:2,GLUCOSEU:2,HGBUR:2,BILIRUBINUR:2,KETONESUR:2,PROTEINUR:2,UROBILINOGEN:2,NITRITE:2,LEUKOCYTESUR:2 in the last 72 hours Misc. Labs:   Micro: Recent Results (from the past 240 hour(s))  CULTURE, BLOOD (ROUTINE X 2)     Status: Normal (Preliminary result)   Collection Time   12/30/11  1:45 PM      Component Value Range Status Comment   Specimen Description BLOOD LEFT HAND   Final    Special Requests BOTTLES DRAWN AEROBIC ONLY 4CC ONE BOTTLE   Final    Culture NO GROWTH 4 DAYS   Final    Report Status PENDING   Incomplete   MRSA PCR SCREENING     Status: Normal   Collection Time   12/30/11  1:49 PM      Component Value Range Status  Comment   MRSA by PCR NEGATIVE  NEGATIVE  Final   CULTURE, BLOOD (ROUTINE X 2)     Status: Normal (Preliminary result)   Collection Time   12/30/11  2:00 PM      Component Value Range Status Comment   Specimen Description BLOOD LEFT ARM   Final    Special Requests     Final    Value: BOTTLES DRAWN AEROBIC AND ANAEROBIC 4CC EACH BOTTLE   Culture NO GROWTH 4 DAYS   Final    Report Status PENDING   Incomplete   URINE CULTURE     Status: Normal   Collection Time   12/30/11  3:10 PM      Component Value Range Status Comment   Specimen Description URINE, CATHETERIZED   Final    Special Requests NONE   Final    Culture  Setup Time [id number]   Final    Colony Count NO GROWTH   Final    Culture NO GROWTH   Final    Report Status 12/31/2011 FINAL   Final     Studies/Results: Ct Abdomen Pelvis Wo Contrast  01/03/2012  *RADIOLOGY REPORT*  Clinical Data: Abdominal pain, nausea and vomiting.  Upper and lower back pain.  Shortness of breath.  Chest pressure.  Renal insufficiency.  CT ABDOMEN AND PELVIS WITHOUT CONTRAST  Technique:  Multidetector CT imaging of the abdomen and pelvis was performed following the standard protocol without intravenous contrast.  Comparison: Previous examinations, including the abdomen radiographs obtained earlier today and abdomen and pelvis CT dated 12/29/2011.  Findings: The examination is limited by the lack of intravenous contrast as well as streak artifacts produced by the patient's right arm.  There has been interval further ill defined increased enlargement of the entire pancreas.  There is wall thickening involving the adjacent portions of the duodenum with significant luminal narrowing in the second portion of the duodenum.  A nasogastric tube is in place with its tip in the mid to distal stomach.  Increased peripancreatic soft tissue stranding and ill-defined fluid, extending inferiorly in the anterior pararenal space as well as interval fluid and stranding in the  posterior pararenal space. The perinephric spaces are preserved.  Interval small bilateral pleural effusions and bilateral lower lobe atelectasis.  Interval dilatation of multiple small bowel loops and transverse colon.  Interval small amount of air in the urinary bladder, suggesting recent catheterization.  Interval diffuse subcutaneous edema as well as a multiple small foci of subcutaneous air on the right, compatible with recent subcutaneous injections.  Interval ill-defined soft tissue and fluid density extending into the left upper abdomen below the hemidiaphragm.  Lumbar  and lower thoracic spine degenerative changes.  IMPRESSION:  1.  Limited examination due to the lack of intravenous contrast and streak artifacts, as described above. 2.  Significant progression of marked changes of acute pancreatitis with no discrete, defined fluid collections seen at this time. 3.  Interval small bilateral pleural effusions and bilateral lower lobe atelectasis. 4.  Interval small bowel and transverse colon ileus.  Original Report Authenticated By: Darrol Angel, M.D.   Dg Chest 2 View  01/03/2012  *RADIOLOGY REPORT*  Clinical Data: Wheezing and abdominal distention  CHEST - 2 VIEW  Comparison: 12/30/2011 and CT 12/30/2011  Findings: Low lung volumes are present.  Taking this into consideration heart and mediastinal contours are stable.  There is a small left pleural effusion identified.  Associated density at the left lung base likely represents atelectasis given the low lung volumes and presence of pleural fluid however an area of focal pneumonia is not excluded in the appropriate clinical setting. No signs of congestive failure or interstitial edema are noted and the pleural effusion may be related to the history of acute pancreatitis.  Remainder of the lung fields appear clear.  IMPRESSION: Persistently low lung volumes with left pleural effusion and probable left basilar volume loss.  Original Report Authenticated  By: Bertha Stakes, M.D.   Dg Abd 1 View  01/03/2012  *RADIOLOGY REPORT*  Clinical Data: Small bowel obstruction  ABDOMEN - 1 VIEW  Comparison: Plain film 12/31/2011, CT 12/29/2011  Findings: Supine exam demonstrates dilated loops of small bowel up to 5 cm which is not improved compared to plain film of 03/25 2013 and worsened compared to CT of 12/29/2011.  There is gas in the transverse colon.  No clear evidence of gas the rectum.  IMPRESSION: Findings consistent with worsening ileus versus small bowel obstruction.  This was made a call report.  Original Report Authenticated By: Genevive Bi, M.D.    Medications: I have reviewed the patient's current medications.  Assessment: Principal Problem:  *Acute pancreatitis Active Problems:  HTN (hypertension)  Alcohol abuse  Hepatic steatosis  Alcohol withdrawal  ARF (acute renal failure)  Hyperkalemia  Metabolic acidosis  Hypocalcemia  Fever  Hypertriglyceridemia  Rhabdomyolysis  Sepsis  UTI (urinary tract infection)  Ileus  Anemia  Wheezing  Hypoalbuminemia  Peripheral edema   1. Acute pancreatitis secondary to alcohol abuse and hypertriglyceridemia. His lipase on admission was 1273. Today it is 113. CT of the abdomen and pelvis yesterday revealed progression of acute pancreatitis but no obvious pseudocyst or drainable fluid. The patient was advised to stop drinking permanently.  Ileus versus partial small bowel obstruction. This is likely secondary to the severe pancreatitis and associated inflammation. An NG was placed yesterday and is draining small amounts of bilious fluid.  Worsening anemia. His hemoglobin on admission was 15.4. It has slowly and progressively decreased, in part due to hemodilution and in part secondary to severe pancreatitis. His hemoglobin is 6.7. There is no evidence of an acute GI or GU bleeding. The anemia panel results are noted for only mild iron deficiency anemia.  Thrombocytopenia. Secondary to  alcoholism and Sirs. Improving.  Alcoholic hepatitis. His ALT and bilirubin have normalize. His AST has improved but is still elevated.  Alcohol abuse and resolved alcoholic withdrawal syndrome. The patient was advised to stop drinking permanently. He is on vitamin therapy and when necessary Ativan.  Sepsis syndrome versus SIRSs secondary to acute pancreatitis. His fever has resolved. His cultures are negative to date. He continues  on Primaxin.  Hypertriglyceridemia. Will start Lopid when his LFTs and clinical course have improved.  Rhabdomyolysis. His CK is still in the thousands but improved from admission. He had started lifting weights prior to this hospitalization.  Acute renal failure. Likely secondary to prerenal azotemia and dye nephropathy. His renal function is improving. Dr. Kristian Covey is following. The patient continues on IV fluids. He did receive IV Lasix a couple days ago but it was discontinued. Nephrology will resume IV Lasix because of peripheral edema.  Hyponatremia. Secondary to acute renal failure. IV fluids and Lasix are being adjusted.  Resolved hyperkalemia. Resolved metabolic acidosis.  Hypocalcemia. This is likely secondary to severe pancreatitis. He has received IV calcium per nephrology.  Hypertension and associated tachycardia. He is on clonidine.  Wheezing and atelectasis. Albuterol was discontinued in favor of Xopenex due to the tachycardia. He was given 1 g of magnesium sulfate to see if it would help with upper airway wheezing. Incentive spirometry has been ordered.   Plan:  1. Continue NG tube and n.p.o. status. 2. We'll start TNA. 3. We'll transfuse 2 units of packed red blood cells. 4. IV Lasix has been restarted by nephrology. 5. Consult gastroenterology. 6. Add IV Protonix.    Total critical care time: 45 minutes.    LOS: 6 days   Peter [name] 01/04/2012, 7:54 AM

## 2012-01-04 NOTE — Plan of Care (Signed)
REVIEWED CT W/ DR. HENN-ILEUS UNCHANGED, NG TUBE NEEDS TO BE ADVANCED. CALLED ORDER TO [facility]. CT SHOWS ? RETROPERITONEAL BLEED.

## 2012-01-04 NOTE — Plan of Care (Cosign Needed)
Problem: Phase I Progression Outcomes Goal: Initial discharge plan identified Outcome: Not Met (add Reason) Pt condition deteriorating

## 2012-01-04 NOTE — Progress Notes (Signed)
Both units PRBCs have now been infused as ordered.  Pt tolerated well. No transfusion reaction.

## 2012-01-04 NOTE — Consult Note (Addendum)
Referring Provider: No ref. provider found Primary Care Physician:  Colette Ribas, MD, MD Primary Gastroenterologist:  DR. Jena Gauss  Reason for Consultation:  pancreatits  HPI:  PT WITH KNOWN HISTORY OF ETOH ABUSE. SEEN AND EVALUATED BY DR. Jena Gauss FOR CHEST PAIN IN 2009. PT ADMITTED 3/23 WITH ACUTE PANCREATITIS W/O NECROSIS AND LIPASE 1273-1946, TRIG 1178, CK 4524. Continues with abdominal pain and nausea/vomtiiting. Repeat CTA/P W/O IVC ON 3/28: DIFFUSE INFLAMMATION INCLUDING EDEMA IN THE DUODENUM, ILEUS. LIPASE NOW 113. PT ABLE TO TOLERATE ICE CHIPS. PASSING GAS. SML STOOL. ABDOMEN IS DISTENDED. HB 14.4 IN JAN 20111. ON ADMISSION HB 15.4 AND NOW DOWN TO 6.7 AFTER HYDRATION W/O MELENA/BRBPR, Ferritin 2243. PT IS HYPERTENSIVE AND TACHYCARDIC.   Past Medical History  Diagnosis Date  . Hypertension   . Ileus 01/03/2012    Past Surgical History  Procedure Date  . Hernia repair     Prior to Admission medications   Not on File    Current Facility-Administered Medications  Medication Dose Route Frequency Provider Last Rate Last Dose  . 0.9 %  sodium chloride infusion   Intravenous Continuous Christiane Ha, MD 75 mL/hr at 01/04/12 0800    . acetaminophen (TYLENOL) tablet 650 mg  650 mg Oral Q4H PRN Christiane Ha, MD   650 mg at 12/30/11 1613  . albuterol (PROVENTIL) (5 MG/ML) 0.5% nebulizer solution 2.5 mg  2.5 mg Nebulization Q2H PRN Christiane Ha, MD   2.5 mg at 01/02/12 2120  . bisacodyl (DULCOLAX) suppository 10 mg  10 mg Rectal Daily PRN Christiane Ha, MD   10 mg at 01/02/12 1004  . cloNIDine (CATAPRES) tablet 0.1 mg  0.1 mg Oral BID Christiane Ha, MD   0.1 mg at 01/03/12 2229  . diphenhydrAMINE (BENADRYL) capsule 25 mg  25 mg Per NG tube Once Haydee Monica, MD   25 mg at 01/04/12 0524   Or  . diphenhydrAMINE (BENADRYL) injection 25 mg  25 mg Intramuscular Once Haydee Monica, MD      . feeding supplement (RESOURCE BREEZE) liquid 1 Container  1 Container Oral  TID BM Elliot Cousin, MD   1 Container at 01/03/12 1008  . furosemide (LASIX) injection 60 mg  60 mg Intravenous BID Jamse Mead, MD   60 mg at 01/04/12 0816  . heparin injection 5,000 Units  5,000 Units Subcutaneous Q8H Christiane Ha, MD   5,000 Units at 01/04/12 0523  . HYDROmorphone (DILAUDID) injection 1 mg  1 mg Intravenous Q3H PRN Elliot Cousin, MD   1 mg at 01/04/12 1610  . imipenem-cilastatin (PRIMAXIN) 500 mg in sodium chloride 0.9 % 100 mL IVPB  500 mg Intravenous Q8H Christiane Ha, MD   500 mg at 01/04/12 0524  . levalbuterol (XOPENEX) 0.63 MG/3ML nebulizer solution           . levalbuterol (XOPENEX) nebulizer solution 0.63 mg  0.63 mg Nebulization Q6H Elliot Cousin, MD   0.63 mg at 01/04/12 0750  . LORazepam (ATIVAN) injection 1-4 mg  1-4 mg Intravenous Q4H PRN Lonia Farber, MD   1 mg at 01/04/12 0328  . magnesium sulfate IVPB 1 g 100 mL  1 g Intravenous Once Elliot Cousin, MD   1 g at 01/03/12 1659  . magnesium sulfate IVPB 1 g 25 mL  1 g Intravenous Once Haydee Monica, MD   1 g at 01/04/12 0005  . ondansetron (ZOFRAN) injection 4 mg  4 mg Intravenous Q6H PRN Corinna  Burman Freestone, MD   4 mg at 01/03/12 2237  . pantoprazole (PROTONIX) injection 40 mg  40 mg Intravenous Q24H Elliot Cousin, MD      . polyethylene glycol (MIRALAX / GLYCOLAX) packet 17 g  17 g Oral Daily Elliot Cousin, MD   17 g at 01/03/12 0954  . senna-docusate (Senokot-S) tablet 2 tablet  2 tablet Oral QHS Elliot Cousin, MD   2 tablet at 01/03/12 2229  . sodium chloride 0.9 % injection 10-40 mL  10-40 mL Intracatheter Q12H Christiane Ha, MD   20 mL at 01/03/12 2228  . sodium chloride 0.9 % injection 10-40 mL  10-40 mL Intracatheter PRN Christiane Ha, MD      . thiamine (VITAMIN B-1) tablet 100 mg  100 mg Oral Daily Christiane Ha, MD   100 mg at 01/03/12 0953  . DISCONTD: furosemide (LASIX) injection 20 mg  20 mg Intravenous Once Elliot Cousin, MD      . DISCONTD: magnesium  sulfate 1 g in dextrose 5 % 250 mL  1 g Intravenous Once Elliot Cousin, MD        Allergies as of 12/29/2011 - Review Complete 12/29/2011  Allergen Reaction Noted  . Ace inhibitors Swelling 12/29/2011    Family History:  Colon Cancer  negative                           Polyps  negative   History   Social History  . Marital Status: Single    Spouse Name: N/A    Number of Children: N/A  . Years of Education: N/A   Occupational History  . Not on file.   Social History Main Topics  . Smoking status: Never Smoker   . Smokeless tobacco: Not on file  . Alcohol Use: 4.2 oz/week    7 Shots of liquor per week     One drink per day per pt  . Drug Use: No  . Sexually Active: Yes   Other Topics Concern  . Not on file   Social History Narrative  . No narrative on file    Review of Systems:  PER HPI OTHERWISE ALL SYSTEMS NEGATIVE MILD SOB.  Vitals: Blood pressure 127/65, pulse 122, temperature 99 F (37.2 C), temperature source Oral, resp. rate 23, height 5\' 10"  (1.778 m), weight 264 lb 1.8 oz (119.8 kg), SpO2 100.00%.  Physical Exam: General:   Alert,  well-nourished, pleasant and cooperative in NAD, NRB IN PLACE Head:  Normocephalic and atraumatic. Eyes:  Sclera clear, no icterus.    Mouth:  No deformity or lesions. Neck:  Supple;  Lungs: WHEEZES BIL   No crackles, or rhonchi. No acute distress. ABLE TO COMPLETE FULL SENTENCES. Heart:  RAPID rate and REG rhythm; no murmurs. Abdomen:  Soft, distended. DIMINISHED bowel sounds, MODERATE TTP IN ALL 4 QUADRANTS, NO GUARDING OR REBOUND.   Msk:  Symmetrical without gross deformities. Normal posture. Extremities:  With edema: TRACE BIL. Neurologic:  Alert and  oriented x4;  grossly normal neurologically. Cervical Nodes:  No significant cervical adenopathy. Psych:  Alert and cooperative. Normal mood and affect.   Lab Results:  Basename 01/04/12 0455 01/03/12 1456 01/03/12 0327  WBC 9.0 8.3 --  HGB 6.7* 7.1* --  HCT 20.3*  21.3* 22.5*  PLT 130* 101* --   BMET  Basename 01/04/12 0455 01/03/12 0327  NA 129* 126*  K 4.6 4.9  CL 98 97  CO2 22  21  GLUCOSE 123* 130*  BUN 26* 38*  CREATININE 2.08* 2.75*  CALCIUM 6.6* 5.8*   LFT  Basename 01/03/12 0327  PROT 6.0  ALBUMIN 2.4*  AST 97*  ALT 27  ALKPHOS 58  BILITOT 1.1  BILIDIR --  IBILI --     Studies/Results: U/S 3/35: GB SLUDGE-NO STONES  Impression: PANCREATITIS DUE TO ETOH/HYPERTRIGLYCERIDEMIA. LIPASE NEAR NL, CLINICALLY PT NOT WELL. ANEMIA MULTIFACTORIAL-8 GM DROP IN HB W/O MELENA OR BRBPR. CT SHOWS POSSIBLE RETROPERITONEAL BLEED- FLUID COLLECTION IN THE ABDOMEN 40 HFU. I PERSONALLY REVIEWED THE CT WITH DR. Lowella Dandy.  STUDY LIMITED DUE TO RENAL FAILURE & LACK OF CONTRAST. LAST FEVER MAR 24. PT ON IMIPENEM.  PT ON HEP SQ. NG TUBE IN PLACE. CR IMPROVED. GLU WNLS.  Plan: D/C HEP SQ, BIL CD-AGREE WITH pRBCs. SUPPORTIVE CARE NG TUBE TO INTERMITTENT LWS, STRICT NPO. CONTINUE IMIPENEM. KUB TODAY.  EGD PRIOR TO D/C. TCS AS AN OP. PROTONIX BID.  START TPN W/O FAT. PT SHOULD HAVE LIMITED ANGIO IF CONTINUES TO HAVE A TRANSFUSION DEPENDENT ANEMIA. CONSIDER SURGERY CONSULT IF PT DEVELOPS FEVER, HYPOTENSION AND/OR TRANSFER TO [facility]ARE [facility]ENTER.  EXPLAINED TO THE PT THE NATURE/MANAGEMENT OF PANCREATITIS. PT WILL NEED TO AVOID ETOH AFTER D/C AND PLACED ON MEDS FOR HYPERTRIGLYCERIDEMIA. PT AWARE THAT HE IS VERY ILL. EXPLAINED  TO PT ANTICIPATE HE WILL BE INPT FOR AT LEAST THE NEXT 7-14 DAYS.  NO INPT GI COVERAGE FROM 3/29 1700 UNTIL Jan 06 729.   LOS: 6 days   [facility]  01/04/2012, 8:43 AM

## 2012-01-04 NOTE — Progress Notes (Signed)
Subjective: Interval History: has complaints nausea and vomiting yesterday . presently  patient is on NG suction. He denies any difficulty in breathing. He has some back pain but no abdominal pain .  Objective: Vital signs in last 24 hours: Temp:  [97.9 F (36.6 C)-99.8 F (37.7 C)] 97.9 F (36.6 C) (03/29 0000) Pulse Rate:  [103-130] 122  (03/29 0600) Resp:  [13-30] 16  (03/29 0600) BP: (114-172)/(68-143) 128/85 mmHg (03/29 0600) SpO2:  [81 %-100 %] 100 % (03/29 0600) FiO2 (%):  [40 %] 40 % (03/29 0244) Weight change:   Intake/Output from previous day: 03/28 0701 - 03/29 0700 In: 2979 [P.O.:560; I.V.:1790; IV Piggyback:629] Out: 1975 [Urine:1875; Emesis/NG output:100] Intake/Output this shift:    General appearance: alert, cooperative and no distress Resp: clear to auscultation bilaterally Cardio: regular rate and rhythm, S1, S2 normal, no murmur, click, rub or gallop GI: Distended, and tympanic. He doesn't have any tenderness or rebound tenderness. He has positive bowel sounds. At this moment very difficult to palpate organomegaly because of his abdominal fullness. Extremities: edema 2-3+ edema bilaterally.  Lab Results:  Basename 01/04/12 0455 01/03/12 1456  WBC 9.0 8.3  HGB 6.7* 7.1*  HCT 20.3* 21.3*  PLT 130* 101*   BMET:  Basename 01/04/12 0455 01/03/12 0327  NA 129* 126*  K 4.6 4.9  CL 98 97  CO2 22 21  GLUCOSE 123* 130*  BUN 26* 38*  CREATININE 2.08* 2.75*  CALCIUM 6.6* 5.8*   No results found for this basename: PTH:2 in the last 72 hours Iron Studies:  Basename 01/03/12 1456  IRON 25*  TIBC 165*  TRANSFERRIN --  FERRITIN 2243*    Studies/Results: Ct Abdomen Pelvis Wo Contrast  01/03/2012  *RADIOLOGY REPORT*  Clinical Data: Abdominal pain, nausea and vomiting.  Upper and lower back pain.  Shortness of breath.  Chest pressure.  Renal insufficiency.  CT ABDOMEN AND PELVIS WITHOUT CONTRAST  Technique:  Multidetector CT imaging of the abdomen and pelvis  was performed following the standard protocol without intravenous contrast.  Comparison: Previous examinations, including the abdomen radiographs obtained earlier today and abdomen and pelvis CT dated 12/29/2011.  Findings: The examination is limited by the lack of intravenous contrast as well as streak artifacts produced by the patient's right arm.  There has been interval further ill defined increased enlargement of the entire pancreas.  There is wall thickening involving the adjacent portions of the duodenum with significant luminal narrowing in the second portion of the duodenum.  A nasogastric tube is in place with its tip in the mid to distal stomach.  Increased peripancreatic soft tissue stranding and ill-defined fluid, extending inferiorly in the anterior pararenal space as well as interval fluid and stranding in the posterior pararenal space. The perinephric spaces are preserved.  Interval small bilateral pleural effusions and bilateral lower lobe atelectasis.  Interval dilatation of multiple small bowel loops and transverse colon.  Interval small amount of air in the urinary bladder, suggesting recent catheterization.  Interval diffuse subcutaneous edema as well as a multiple small foci of subcutaneous air on the right, compatible with recent subcutaneous injections.  Interval ill-defined soft tissue and fluid density extending into the left upper abdomen below the hemidiaphragm.  Lumbar and lower thoracic spine degenerative changes.  IMPRESSION:  1.  Limited examination due to the lack of intravenous contrast and streak artifacts, as described above. 2.  Significant progression of marked changes of acute pancreatitis with no discrete, defined fluid collections seen at this time.  3.  Interval small bilateral pleural effusions and bilateral lower lobe atelectasis. 4.  Interval small bowel and transverse colon ileus.  Original Report Authenticated By: Darrol Angel, M.D.   Dg Chest 2 View  01/03/2012   *RADIOLOGY REPORT*  Clinical Data: Wheezing and abdominal distention  CHEST - 2 VIEW  Comparison: 12/30/2011 and CT 12/30/2011  Findings: Low lung volumes are present.  Taking this into consideration heart and mediastinal contours are stable.  There is a small left pleural effusion identified.  Associated density at the left lung base likely represents atelectasis given the low lung volumes and presence of pleural fluid however an area of focal pneumonia is not excluded in the appropriate clinical setting. No signs of congestive failure or interstitial edema are noted and the pleural effusion may be related to the history of acute pancreatitis.  Remainder of the lung fields appear clear.  IMPRESSION: Persistently low lung volumes with left pleural effusion and probable left basilar volume loss.  Original Report Authenticated By: Bertha Stakes, M.D.   Dg Abd 1 View  01/03/2012  *RADIOLOGY REPORT*  Clinical Data: Small bowel obstruction  ABDOMEN - 1 VIEW  Comparison: Plain film 12/31/2011, CT 12/29/2011  Findings: Supine exam demonstrates dilated loops of small bowel up to 5 cm which is not improved compared to plain film of 03/25 2013 and worsened compared to CT of 12/29/2011.  There is gas in the transverse colon.  No clear evidence of gas the rectum.  IMPRESSION: Findings consistent with worsening ileus versus small bowel obstruction.  This was made a call report.  Original Report Authenticated By: Genevive Bi, M.D.    I have reviewed the patient's current medications.  Assessment/Plan: Problem #1 her renal failure acute his BUN and creatinine this moment seems to be improving. Patient is none oliguric. Problem #2 leg swelling possibly secondary to IV fluid. Presently he has reasonable urine output. Since patient has low albumin and probably may be contributing. Problem #3 history of acute pancreatitis patient presently on any dissection.. Problem #4 hypertension blood pressure is controlled  reasonably good Problem #5 hypocalcemia calcium is improving progressively. Problem #6 anemia his hemoglobin and hematocrit seems to be worsening. Problem #7 history of metabolic acidosis that has improved. Problem #8 history of ileus Plan: We will start him on her Lasix 60 mg IV twice a day.           Will check his basic metabolic panel in the morning. Plan:    LOS: 6 days   Adamarie Izzo S 01/04/2012,7:25 AM

## 2012-01-05 DIAGNOSIS — R739 Hyperglycemia, unspecified: Secondary | ICD-10-CM | POA: Diagnosis not present

## 2012-01-05 LAB — COMPREHENSIVE METABOLIC PANEL
ALT: 25 U/L (ref 0–53)
AST: 80 U/L — ABNORMAL HIGH (ref 0–37)
CO2: 26 mEq/L (ref 19–32)
Calcium: 7.4 mg/dL — ABNORMAL LOW (ref 8.4–10.5)
Chloride: 101 mEq/L (ref 96–112)
Creatinine, Ser: 1.97 mg/dL — ABNORMAL HIGH (ref 0.50–1.35)
GFR calc Af Amer: 46 mL/min — ABNORMAL LOW (ref >90)
GFR calc non Af Amer: 40 mL/min — ABNORMAL LOW (ref >90)
Glucose, Bld: 188 mg/dL — ABNORMAL HIGH (ref 70–99)
Total Bilirubin: 0.9 mg/dL (ref 0.3–1.2)

## 2012-01-05 LAB — CBC
HCT: 23.5 % — ABNORMAL LOW (ref 39.0–52.0)
HCT: 23.5 % — ABNORMAL LOW (ref 39.0–52.0)
MCH: 28.2 pg (ref 26.0–34.0)
MCH: 28.9 pg (ref 26.0–34.0)
MCH: 28.8 pg (ref 26.0–34.0)
MCHC: 34.1 g/dL (ref 30.0–36.0)
MCV: 84.8 fL (ref 78.0–100.0)
MCV: 84.8 fL (ref 78.0–100.0)
MCV: 84.6 fL (ref 78.0–100.0)
Platelets: 170 10*3/uL (ref 150–400)
Platelets: 177 10*3/uL (ref 150–400)
RBC: 2.77 MIL/uL — ABNORMAL LOW (ref 4.22–5.81)
RDW: 15.8 % — ABNORMAL HIGH (ref 11.5–15.5)
RDW: 15.8 % — ABNORMAL HIGH (ref 11.5–15.5)
WBC: 11 10*3/uL — ABNORMAL HIGH (ref 4.0–10.5)
WBC: 10.7 10*3/uL — ABNORMAL HIGH (ref 4.0–10.5)

## 2012-01-05 LAB — PREPARE RBC (CROSSMATCH)

## 2012-01-05 LAB — DIFFERENTIAL
Lymphs Abs: 0.6 10*3/uL — ABNORMAL LOW (ref 0.7–4.0)
Monocytes Absolute: 0.6 10*3/uL (ref 0.1–1.0)
Monocytes Relative: 7 % (ref 3–12)
Neutro Abs: 8.6 10*3/uL — ABNORMAL HIGH (ref 1.7–7.7)
Neutrophils Relative %: 87 % — ABNORMAL HIGH (ref 43–77)

## 2012-01-05 LAB — GLUCOSE, CAPILLARY: Glucose-Capillary: 171 mg/dL — ABNORMAL HIGH (ref 70–99)

## 2012-01-05 LAB — LIPASE, BLOOD: Lipase: 107 U/L — ABNORMAL HIGH (ref 11–59)

## 2012-01-05 LAB — MAGNESIUM: Magnesium: 2.4 mg/dL (ref 1.5–2.5)

## 2012-01-05 MED ORDER — INSULIN REGULAR HUMAN 100 UNIT/ML IJ SOLN
INTRAVENOUS | Status: AC
  Administered 2012-01-05 (×2): via INTRAVENOUS
  Filled 2012-01-05: qty 2000

## 2012-01-05 MED ORDER — INSULIN ASPART 100 UNIT/ML ~~LOC~~ SOLN
0.0000 [IU] | SUBCUTANEOUS | Status: DC
  Administered 2012-01-05 (×4): 4 [IU] via SUBCUTANEOUS
  Administered 2012-01-06: 3 [IU] via SUBCUTANEOUS
  Administered 2012-01-06: 4 [IU] via SUBCUTANEOUS
  Administered 2012-01-07 (×4): 3 [IU] via SUBCUTANEOUS
  Administered 2012-01-07: 4 [IU] via SUBCUTANEOUS
  Administered 2012-01-08 – 2012-01-10 (×7): 3 [IU] via SUBCUTANEOUS

## 2012-01-05 MED ORDER — VANCOMYCIN HCL IN DEXTROSE 1-5 GM/200ML-% IV SOLN
1000.0000 mg | Freq: Two times a day (BID) | INTRAVENOUS | Status: DC
  Administered 2012-01-05 – 2012-01-07 (×5): 1000 mg via INTRAVENOUS
  Filled 2012-01-05 (×5): qty 200

## 2012-01-05 MED ORDER — SODIUM CHLORIDE 0.9 % IJ SOLN
INTRAMUSCULAR | Status: AC
  Filled 2012-01-05: qty 6

## 2012-01-05 NOTE — Progress Notes (Signed)
Tylenol 650mg  po given for slow temp spike during blood administration.  Blood cultures taken at 0800 today x's 2.  Patient has had multiple low grade temps during admission.  MD notified.

## 2012-01-05 NOTE — Consult Note (Signed)
PARENTERAL NUTRITION CONSULT NOTE   Pharmacy Consult for TPN Indication: severe pancreatitis  Allergies  Allergen Reactions  . Ace Inhibitors Swelling    Lip swelling   Patient Measurements: Height: 5\' 10"  (177.8 cm) Weight: 264 lb 1.8 oz (119.8 kg) IBW/kg (Calculated) : 73  Adjusted Body Weight: 87KG  Vital Signs: Temp: 98.8 F (37.1 C) (03/30 0800) Temp src: Oral (03/30 0800) BP: 136/94 mmHg (03/30 0900) Pulse Rate: 120  (03/30 0900) Intake/Output from previous day: 03/29 0701 - 03/30 0700 In: 4056.8 [I.V.:1535; Blood:700; NG/GT:120; IV Piggyback:316; TPN:745.8] Out: 5450 [Urine:4650; Emesis/NG output:800] Intake/Output from this shift: Total I/O In: -  Out: 475 [Urine:475]  Labs:  [facility] 01/05/12 0832 01/04/12 2233 01/04/12 1634  WBC 10.7* 9.3 10.4  HGB 8.0* 8.1* 8.3*  HCT 23.5* 23.7* 24.3*  PLT 171 151 137*  APTT -- -- --  INR -- -- --    Basename 01/05/12 0516 01/04/12 0455 01/03/12 2302 01/03/12 0327 01/02/12 1944  NA 135 129* -- 126* --  K 3.8 4.6 -- 4.9 --  CL 101 98 -- 97 --  CO2 26 22 -- 21 --  GLUCOSE 188* 123* -- 130* --  BUN 23 26* -- 38* --  CREATININE 1.97* 2.08* -- 2.75* --  LABCREA -- -- -- -- 202.97  CREAT24HRUR -- -- -- -- --  CALCIUM 7.4* 6.6* -- 5.8* --  MG 2.4 -- 1.9 -- --  PHOS 2.2* -- -- -- --  PROT 6.3 -- -- 6.0 --  ALBUMIN 2.4* -- -- 2.4* --  AST 80* -- -- 97* --  ALT 25 -- -- 27 --  ALKPHOS 58 -- -- 58 --  BILITOT 0.9 -- -- 1.1 --  BILIDIR -- -- -- -- --  IBILI -- -- -- -- --  PREALBUMIN -- -- -- -- --  TRIG -- -- -- -- --  CHOLHDL -- -- -- -- --  CHOL -- -- -- -- --   Estimated Creatinine Clearance: 62.7 ml/min (by C-G formula based on Cr of 1.97).    Basename 01/04/12 1802  GLUCAP 133*   Medical History: Past Medical History  Diagnosis Date  . Hypertension   . Ileus 01/03/2012  . Retroperitoneal bleed 01/04/2012   Medications:  Scheduled:     . cloNIDine  0.1 mg Oral BID  . furosemide  60 mg Intravenous  BID  . imipenem-cilastatin  500 mg Intravenous Q8H  . insulin aspart  0-20 Units Subcutaneous Q4H  . levalbuterol  0.63 mg Nebulization Q6H  . pantoprazole (PROTONIX) IV  40 mg Intravenous BID  . senna-docusate  2 tablet Oral QHS  . sodium chloride  10-40 mL Intracatheter Q12H  . sodium chloride      . sodium chloride      . thiamine  100 mg Oral Daily  . vancomycin  1,000 mg Intravenous Q12H  . DISCONTD: feeding supplement  1 Container Oral TID BM  . DISCONTD: heparin subcutaneous  5,000 Units Subcutaneous Q8H  . DISCONTD: pantoprazole (PROTONIX) IV  40 mg Intravenous Q24H  . DISCONTD: polyethylene glycol  17 g Oral Daily    Insulin Requirements in the past 24 hours:  SSI started this am (4 units given)  Current Nutrition:  TPN at 92ml/hr  Assessment: Obesity Severe pancreatitis Hypoalbuminemia Hyponatremia resolved Hypocalcemia resolved (Corrected Ca = 8.7) Hyperglycemia due to TPN SCr improving Mild hypophosphatemia  Nutritional Goals:  2000-2100 kCal, 100-120 grams of protein per day  Plan: Advance TPN to 71ml/hr today ([employer]  E 5/15) NO LIPID (FAT) INFUSION per Dr Darrick Penna (d/w MD) Add regular insulin 15 units/Liter (30 units per bag) Continue IVF (saline) at 20 ml/hr MVI and Trace Elements every Mon-Wed-Fri F/U Lytes, fluid status, renal fxn, glucose tolerance tomorrow  Margo Aye, Lorin Picket A 01/05/2012,9:42 AM

## 2012-01-05 NOTE — Progress Notes (Signed)
UNABLE TO AMBULATE PT IN HALL AGAIN TODAY D/T SEVERE WEAKNESS AND SOB W/ ANY EXERTION. HGB THIS EVENING IS 7.7. WILL TRANSFUSION 1 ADDITIONAL UNIT PRBC  WHEN AVAILABLE.

## 2012-01-05 NOTE — Progress Notes (Addendum)
Subjective: The patient has no new complaints. Had a lage non bloody BM this am.  Objective: Vital signs in last 24 hours: Filed Vitals:   01/05/12 0900 01/05/12 1000 01/05/12 1100 01/05/12 1157  BP: 136/94 117/80 129/86   Pulse: 120 25 116 116  Temp:      TempSrc:      Resp: 13 23 26 22   Height:      Weight:      SpO2: 99% 90% 94% 96%    Intake/Output Summary (Last 24 hours) at 01/05/12 1205 Last data filed at 01/05/12 1100  Gross per 24 hour  Intake 2955.83 ml  Output   5325 ml  Net -2369.17 ml    Weight change:  Physical exam: General: 41 year old obese African recommend sitting up in bed, in no acute distress. Lungs: A few upper airway wheezes, otherwise decreased breath sounds globally. Heart: S1, S2, with tachycardia. Abdomen: Moderately distended, hypoactive bowel sounds, moderately tender diffusely. Tympanic. Extremities: 1+ bilateral pedal edema. Neuro: Alert & oriented x 3. No tremulousness.  Lab Results: Basic Metabolic Panel:  Basename 01/05/12 0516 01/04/12 0455 01/03/12 2302  NA 135 129* --  K 3.8 4.6 --  CL 101 98 --  CO2 26 22 --  GLUCOSE 188* 123* --  BUN 23 26* --  CREATININE 1.97* 2.08* --  CALCIUM 7.4* 6.6* --  MG 2.4 -- 1.9  PHOS 2.2* -- --   Liver Function Tests:  [facility] 01/05/12 0516 01/03/12 0327  AST 80* 97*  ALT 25 27  ALKPHOS 58 58  BILITOT 0.9 1.1  PROT 6.3 6.0  ALBUMIN 2.4* 2.4*    Basename 01/05/12 0516 01/04/12 0455  LIPASE 107* 113*  AMYLASE -- --   No results found for this basename: AMMONIA:2 in the last 72 hours CBC:  Basename 01/05/12 0832 01/04/12 2233  WBC 10.7* 9.3  NEUTROABS -- --  HGB 8.0* 8.1*  HCT 23.5* 23.7*  MCV 84.8 84.3  PLT 171 151   Cardiac Enzymes:  Basename 01/05/12 0516 01/04/12 0455 01/03/12 0327  CKTOTAL 2147* 3048* 3464*  CKMB -- -- --  CKMBINDEX -- -- --  TROPONINI -- -- --   BNP: No results found for this basename: PROBNP:3 in the last 72 hours D-Dimer: No results found for  this basename: DDIMER:2 in the last 72 hours CBG:  Basename 01/05/12 1140 01/05/12 0757 01/04/12 1802  GLUCAP 173* 171* 133*   Hemoglobin A1C: No results found for this basename: HGBA1C in the last 72 hours Fasting Lipid Panel:  Basename 01/05/12 0516  CHOL 185  HDL --  LDLCALC --  TRIG 288*  CHOLHDL --  LDLDIRECT --   Thyroid Function Tests: No results found for this basename: TSH,T4TOTAL,FREET4,T3FREE,THYROIDAB in the last 72 hours Anemia Panel:  Basename 01/03/12 1456  VITAMINB12 1188*  FOLATE 15.3  FERRITIN 2243*  TIBC 165*  IRON 25*  RETICCTPCT 2.3   Coagulation: No results found for this basename: LABPROT:2,INR:2 in the last 72 hours Urine Drug Screen: Drugs of Abuse     Component Value Date/Time   LABOPIA POSITIVE* 12/30/2011 1433   COCAINSCRNUR NONE DETECTED 12/30/2011 1433   LABBENZ NONE DETECTED 12/30/2011 1433   AMPHETMU NONE DETECTED 12/30/2011 1433   THCU NONE DETECTED 12/30/2011 1433   LABBARB NONE DETECTED 12/30/2011 1433    Alcohol Level: No results found for this basename: ETH:2 in the last 72 hours Urinalysis: No results found for this basename: COLORURINE:2,APPERANCEUR:2,LABSPEC:2,PHURINE:2,GLUCOSEU:2,HGBUR:2,BILIRUBINUR:2,KETONESUR:2,PROTEINUR:2,UROBILINOGEN:2,NITRITE:2,LEUKOCYTESUR:2 in the last 72 hours Misc. Labs:   Micro:  Recent Results (from the past 240 hour(s))  CULTURE, BLOOD (ROUTINE X 2)     Status: Normal (Preliminary result)   Collection Time   12/30/11  1:45 PM      Component Value Range Status Comment   Specimen Description BLOOD LEFT HAND   Final    Special Requests BOTTLES DRAWN AEROBIC ONLY 4CC ONE BOTTLE   Final    Culture NO GROWTH 4 DAYS   Final    Report Status PENDING   Incomplete   MRSA PCR SCREENING     Status: Normal   Collection Time   12/30/11  1:49 PM      Component Value Range Status Comment   MRSA by PCR NEGATIVE  NEGATIVE  Final   CULTURE, BLOOD (ROUTINE X 2)     Status: Normal (Preliminary result)    Collection Time   12/30/11  2:00 PM      Component Value Range Status Comment   Specimen Description BLOOD LEFT ARM   Final    Special Requests     Final    Value: BOTTLES DRAWN AEROBIC AND ANAEROBIC 4CC EACH BOTTLE   Culture NO GROWTH 4 DAYS   Final    Report Status PENDING   Incomplete   URINE CULTURE     Status: Normal   Collection Time   12/30/11  3:10 PM      Component Value Range Status Comment   Specimen Description URINE, CATHETERIZED   Final    Special Requests NONE   Final    Culture  Setup Time [id number]   Final    Colony Count NO GROWTH   Final    Culture NO GROWTH   Final    Report Status 12/31/2011 FINAL   Final     Studies/Results: Ct Abdomen Pelvis Wo Contrast  01/03/2012  *RADIOLOGY REPORT*  Clinical Data: Abdominal pain, nausea and vomiting.  Upper and lower back pain.  Shortness of breath.  Chest pressure.  Renal insufficiency.  CT ABDOMEN AND PELVIS WITHOUT CONTRAST  Technique:  Multidetector CT imaging of the abdomen and pelvis was performed following the standard protocol without intravenous contrast.  Comparison: Previous examinations, including the abdomen radiographs obtained earlier today and abdomen and pelvis CT dated 12/29/2011.  Findings: The examination is limited by the lack of intravenous contrast as well as streak artifacts produced by the patient's right arm.  There has been interval further ill defined increased enlargement of the entire pancreas.  There is wall thickening involving the adjacent portions of the duodenum with significant luminal narrowing in the second portion of the duodenum.  A nasogastric tube is in place with its tip in the mid to distal stomach.  Increased peripancreatic soft tissue stranding and ill-defined fluid, extending inferiorly in the anterior pararenal space as well as interval fluid and stranding in the posterior pararenal space. The perinephric spaces are preserved.  Interval small bilateral pleural effusions and bilateral  lower lobe atelectasis.  Interval dilatation of multiple small bowel loops and transverse colon.  Interval small amount of air in the urinary bladder, suggesting recent catheterization.  Interval diffuse subcutaneous edema as well as a multiple small foci of subcutaneous air on the right, compatible with recent subcutaneous injections.  Interval ill-defined soft tissue and fluid density extending into the left upper abdomen below the hemidiaphragm.  Lumbar and lower thoracic spine degenerative changes.  IMPRESSION:  1.  Limited examination due to the lack of intravenous contrast and streak artifacts, as described above. 2.  Significant progression of marked changes of acute pancreatitis with no discrete, defined fluid collections seen at this time. 3.  Interval small bilateral pleural effusions and bilateral lower lobe atelectasis. 4.  Interval small bowel and transverse colon ileus.  Original Report Authenticated By: Darrol Angel, M.D.   Dg Abd 1 View  01/04/2012  *RADIOLOGY REPORT*  Clinical Data: Abdominal pain.  ABDOMEN - 1 VIEW  Comparison: CT from 01/03/2012.  Abdomen x-ray from 01/03/2012.  Findings: As before, there is diffuse gaseous bowel distention involving large and small intestine.  Maximum small bowel distention is about 6.1 cm today which when remeasuring in the same location on yesterday's study, reveals no substantial interval change.  IMPRESSION: Persistent diffuse dilatation of large and small bowel as before.  Original Report Authenticated By: ERIC A. MANSELL, M.D.    Medications: I have reviewed the patient's current medications.  Assessment: Principal Problem:  *Acute pancreatitis Active Problems:  HTN (hypertension)  Alcohol abuse  Hepatic steatosis  Alcohol withdrawal  ARF (acute renal failure)  Hyperkalemia  Metabolic acidosis  Hypocalcemia  Fever  Hypertriglyceridemia  Rhabdomyolysis  Sepsis  UTI (urinary tract infection)  Ileus  Anemia  Wheezing   Hypoalbuminemia  Peripheral edema  Retroperitoneal bleed  Hyperglycemia   1. Acute pancreatitis secondary to alcohol abuse and hypertriglyceridemia. His lipase on admission was 1273. Today it is 107. CT of the abdomen and pelvis yesterday revealed progression of acute pancreatitis but no obvious pseudocyst or drainable fluid. ?? of retroperitoneal bleed, per Dr. Darrick Penna discussion with radiology. The patient was advised to stop drinking permanently.  Ileus versus partial small bowel obstruction. This is likely secondary to the severe pancreatitis and associated inflammation. An NG was placed and is draining small amounts of bilious fluid.  Worsening anemia. His hemoglobin on admission was 15.4. It has slowly and progressively decreased, in part due to hemodilution and in part secondary to severe pancreatitis; now with possible retroperitoneal bleed. Hb nadir of 6.7. Lovenox stopped. S/p prbc transfusion of 2 units. Will try to keep his Hb around 8 gms as minimum.  Thrombocytopenia. Secondary to alcoholism and Sirs. Resolved.  Alcoholic hepatitis. His ALT and bilirubin have normalize. His AST has improved but is still elevated.  Alcohol abuse and resolved alcoholic withdrawal syndrome. The patient was advised to stop drinking permanently. He is on vitamin therapy and when necessary Ativan.  Sepsis syndrome versus SIRSs secondary to acute pancreatitis. His fever has returned.  His cultures are negative to date. He continues on Primaxin. Will add Vanco and re-culture.  Hypertriglyceridemia. TG level much improved. Will consider starting Lopid at d/c.Marland Kitchen  Rhabdomyolysis. His CK is still in the thousands but improved from admission. He had started lifting weights prior to this hospitalization.  Acute renal failure. Likely secondary to prerenal azotemia and dye nephropathy. His renal function is improving. Dr. Kristian Covey is following. The patient is now on KVO IV fluids with start of TPN. He did receive  IV Lasix a couple days ago but it was discontinued. Nephrology has resumed IV Lasix because of peripheral edema.  Hyponatremia. Secondary to acute renal failure. IV fluids and Lasix are being adjusted.  Resolved hyperkalemia. Resolved metabolic acidosis.  Hypocalcemia. This is likely secondary to severe pancreatitis. He has received IV calcium per nephrology.  Hypertension and associated tachycardia. He is on clonidine.  Wheezing and atelectasis. Albuterol was discontinued in favor of Xopenex due to the tachycardia. He was given 1 g of magnesium sulfate to see if it would help  with upper airway wheezing. Incentive spirometry has been ordered.  Nutrition: TPN started yesterday.  Plan:  1. GI input appreciated. 2. Novolog started for hyperglycemia. 3. Continue q 6hr CBCs for 24 more hrs. Transfuse again if Hb falls progressively below 8 gms or if he becomes hemodynamically unstable. 4. Check another set of blood cx for low grade fever.    Total critical care time: 45 minutes.    LOS: 7 days   Peter [name] 01/05/2012, 12:05 PM

## 2012-01-05 NOTE — Progress Notes (Signed)
SPOKE W/ DR BHUTANI. HE DOES WANT TNA TO RUN AT PRESCRIBES RATE AND NS TO RUN AT 75CC/HR D/T NEGATIVE FLUID BAALNCE.

## 2012-01-05 NOTE — Consult Note (Signed)
ANTIBIOTIC CONSULT NOTE - INITIAL  Pharmacy Consult for Vancomycin Indication: rule out sepsis  Allergies  Allergen Reactions  . Ace Inhibitors Swelling    Lip swelling   Patient Measurements: Height: 5\' 10"  (177.8 cm) Weight: 264 lb 1.8 oz (119.8 kg) IBW/kg (Calculated) : 73   Vital Signs: Temp: 100.4 F (38 C) (03/30 0000) Temp src: Oral (03/30 0000) BP: 152/84 mmHg (03/30 0700) Pulse Rate: 119  (03/30 0700) Intake/Output from previous day: 03/29 0701 - 03/30 0700 In: 4056.8 [I.V.:1535; Blood:700; NG/GT:120; IV Piggyback:316; TPN:745.8] Out: 5450 [Urine:4650; Emesis/NG output:800] Intake/Output from this shift:    Labs:  Basename 01/05/12 0516 01/04/12 2233 01/04/12 1634 01/04/12 0455 01/03/12 0327 01/02/12 1944  WBC -- 9.3 10.4 9.0 -- --  HGB -- 8.1* 8.3* 6.7* -- --  PLT -- 151 137* 130* -- --  LABCREA -- -- -- -- -- 202.97  CREATININE 1.97* -- -- 2.08* 2.75* --   Estimated Creatinine Clearance: 62.7 ml/min (by C-G formula based on Cr of 1.97). No results found for this basename: VANCOTROUGH:2,VANCOPEAK:2,VANCORANDOM:2,GENTTROUGH:2,GENTPEAK:2,GENTRANDOM:2,TOBRATROUGH:2,TOBRAPEAK:2,TOBRARND:2,AMIKACINPEAK:2,AMIKACINTROU:2,AMIKACIN:2, in the last 72 hours   Microbiology: Recent Results (from the past 720 hour(s))  CULTURE, BLOOD (ROUTINE X 2)     Status: Normal (Preliminary result)   Collection Time   12/30/11  1:45 PM      Component Value Range Status Comment   Specimen Description BLOOD LEFT HAND   Final    Special Requests BOTTLES DRAWN AEROBIC ONLY 4CC ONE BOTTLE   Final    Culture NO GROWTH 4 DAYS   Final    Report Status PENDING   Incomplete   MRSA PCR SCREENING     Status: Normal   Collection Time   12/30/11  1:49 PM      Component Value Range Status Comment   MRSA by PCR NEGATIVE  NEGATIVE  Final   CULTURE, BLOOD (ROUTINE X 2)     Status: Normal (Preliminary result)   Collection Time   12/30/11  2:00 PM      Component Value Range Status Comment   Specimen Description BLOOD LEFT ARM   Final    Special Requests     Final    Value: BOTTLES DRAWN AEROBIC AND ANAEROBIC 4CC EACH BOTTLE   Culture NO GROWTH 4 DAYS   Final    Report Status PENDING   Incomplete   URINE CULTURE     Status: Normal   Collection Time   12/30/11  3:10 PM      Component Value Range Status Comment   Specimen Description URINE, CATHETERIZED   Final    Special Requests NONE   Final    Culture  Setup Time [id number]   Final    Colony Count NO GROWTH   Final    Culture NO GROWTH   Final    Report Status 12/31/2011 FINAL   Final    Medical History: Past Medical History  Diagnosis Date  . Hypertension   . Ileus 01/03/2012  . Retroperitoneal bleed 01/04/2012   Medications:  Scheduled:    . cloNIDine  0.1 mg Oral BID  . furosemide  60 mg Intravenous BID  . imipenem-cilastatin  500 mg Intravenous Q8H  . levalbuterol  0.63 mg Nebulization Q6H  . pantoprazole (PROTONIX) IV  40 mg Intravenous BID  . senna-docusate  2 tablet Oral QHS  . sodium chloride  10-40 mL Intracatheter Q12H  . sodium chloride      . sodium chloride      . thiamine  100 mg Oral Daily  . vancomycin  1,000 mg Intravenous Q12H  . DISCONTD: feeding supplement  1 Container Oral TID BM  . DISCONTD: heparin subcutaneous  5,000 Units Subcutaneous Q8H  . DISCONTD: pantoprazole (PROTONIX) IV  40 mg Intravenous Q24H  . DISCONTD: polyethylene glycol  17 g Oral Daily   Assessment: SCr improving, ClCr ~ 62 ml/min Also on Primaxin  Goal of Therapy:  Vancomycin trough level 15-20 mcg/ml  Plan: Vancomycin 1gm iv q12hrs Check trough at steady state  Susannah Carbin A 01/05/2012,7:38 AM

## 2012-01-05 NOTE — Progress Notes (Signed)
Peter [name]  MRN: [id number]  DOB/AGE: [birth year] 41 y.o.  Primary Care Physician:GOLDING,Bransen [name], MD, MD  Admit date: 12/29/2011  Chief Complaint:  Chief Complaint  Patient presents with  . Abdominal Pain  . Emesis  . Diarrhea    [name] presented on  12/29/2011 with  Chief Complaint  Patient presents with  . Abdominal Pain  . Emesis  . Diarrhea  .    Pt today feels better    Pt is asking for food.  Meds     . cloNIDine  0.1 mg Oral BID  . furosemide  60 mg Intravenous BID  . imipenem-cilastatin  500 mg Intravenous Q8H  . insulin aspart  0-20 Units Subcutaneous Q4H  . levalbuterol  0.63 mg Nebulization Q6H  . pantoprazole (PROTONIX) IV  40 mg Intravenous BID  . senna-docusate  2 tablet Oral QHS  . sodium chloride  10-40 mL Intracatheter Q12H  . sodium chloride      . thiamine  100 mg Oral Daily  . vancomycin  1,000 mg Intravenous Q12H         [id number] from the symptoms mentioned above,there are no other symptoms referable to all systems reviewed.  Physical Exam: Vital signs in last 24 hours: Temp:  [98.4 F (36.9 C)-100.4 F (38 C)] 98.4 F (36.9 C) (03/30 1200) Pulse Rate:  [25-128] 117  (03/30 1400) Resp:  [13-27] 13  (03/30 1300) BP: (108-152)/(56-98) 128/86 mmHg (03/30 1400) SpO2:  [89 %-99 %] 94 % (03/30 1426) FiO2 (%):  [0.4 %-35 %] 35 % (03/30 0531) Weight change:  Last BM Date: 01/05/12  Intake/Output from previous day: 03/29 0701 - 03/30 0700 In: 4056.8 [I.V.:1485; Blood:700; NG/GT:120; IV Piggyback:316; TPN:795.8] Out: 5450 [Urine:4650; Emesis/NG output:800] Total I/O In: 895 [I.V.:175; NG/GT:60; IV Piggyback:310; TPN:350] Out: 1375 [Urine:1375]   Physical Exam: General- pt is awake,alert, oriented to time place and person Resp- No acute REsp distress,  NO Rhonchi CVS- S1S2 regular in rate and rhythm GIT- BS+, But decreased, Distended + EXT-Trace LE Edema, NO Cyanosis   Lab Results: CBC  Basename 01/05/12 0832 01/05/12  0516  WBC 10.7* 11.0*  HGB 8.0* 7.8*  HCT 23.5* 23.5*  PLT 171 170    BMET  Basename 01/05/12 0516 01/04/12 0455  NA 135 129*  K 3.8 4.6  CL 101 98  CO2 26 22  GLUCOSE 188* 123*  BUN 23 26*  CREATININE 1.97* 2.08*  CALCIUM 7.4* 6.6*    Trend  Creat 5.45==>4.53==>3.73==>2.0==>1.97  Calcium 4.8==>5.0==>5.2 ==>6.6==>7.4 Alb 2.6  Sodium 135==>126==>129==>135   MICRO Recent Results (from the past 240 hour([name]))  CULTURE, BLOOD (ROUTINE X 2)     Status: Normal (Preliminary result)   Collection Time   12/30/11  1:45 PM      Component Value Range Status Comment   Specimen Description BLOOD LEFT HAND   Final    Special Requests BOTTLES DRAWN AEROBIC ONLY 4CC ONE BOTTLE   Final    Culture NO GROWTH 4 DAYS   Final    Report Status PENDING   Incomplete   MRSA PCR SCREENING     Status: Normal   Collection Time   12/30/11  1:49 PM      Component Value Range Status Comment   MRSA by PCR NEGATIVE  NEGATIVE  Final   CULTURE, BLOOD (ROUTINE X 2)     Status: Normal (Preliminary result)   Collection Time   12/30/11  2:00 PM      Component Value Range  Status Comment   Specimen Description BLOOD LEFT ARM   Final    Special Requests     Final    Value: BOTTLES DRAWN AEROBIC AND ANAEROBIC 4CC EACH BOTTLE   Culture NO GROWTH 4 DAYS   Final    Report Status PENDING   Incomplete   URINE CULTURE     Status: Normal   Collection Time   12/30/11  3:10 PM      Component Value Range Status Comment   Specimen Description URINE, CATHETERIZED   Final    Special Requests NONE   Final    Culture  Setup Time [id number]   Final    Colony Count NO GROWTH   Final    Culture NO GROWTH   Final    Report Status 12/31/2011 FINAL   Final       Lab Results  Component Value Date   CALCIUM 7.4* 01/05/2012   CAION CANCELLED BY LAB 01/03/2012   PHOS 2.2* 01/05/2012        Impression: 1)Renal AKI secondary to Contrast Induced ATN With some contribution from SIRS as Pancreatitits  AKI now improving    Creatinine is  trending down  Non Oliguiric ATN  2)HTN BP at goal  In SIRS state -will not aim for 120'[name]   3)Anemia HGb not at goal (9--11)                   Dilutional + Acute sickness+ CT showed Retroperitoneal bleed                  Primary following for need of Transfusion  4)GI- Admitted with Pancreatitis          Now better   5)Hypocalcium- Secondary to asso with Fats after Pancreatitis'                             Now better  6)HypoNatremia - Now better   7)Acid base  Co2 at goal       Plan: Will continue current treatment and plan Will most likely reduce IV lasix in am  Will increase IVf now as Bleed in retroperotoneum     Peter [name] 01/05/2012, 2:54 PM

## 2012-01-06 LAB — BASIC METABOLIC PANEL
CO2: 29 mEq/L (ref 19–32)
Chloride: 100 mEq/L (ref 96–112)
Glucose, Bld: 138 mg/dL — ABNORMAL HIGH (ref 70–99)
Potassium: 3.7 mEq/L (ref 3.5–5.1)
Sodium: 139 mEq/L (ref 135–145)

## 2012-01-06 LAB — CBC
HCT: 26.5 % — ABNORMAL LOW (ref 39.0–52.0)
Hemoglobin: 7.9 g/dL — ABNORMAL LOW (ref 13.0–17.0)
Hemoglobin: 9 g/dL — ABNORMAL LOW (ref 13.0–17.0)
Hemoglobin: 9.1 g/dL — ABNORMAL LOW (ref 13.0–17.0)
MCH: 28.7 pg (ref 26.0–34.0)
MCH: 28.8 pg (ref 26.0–34.0)
MCH: 28.8 pg (ref 26.0–34.0)
MCHC: 34 g/dL (ref 30.0–36.0)
MCV: 85 fL (ref 78.0–100.0)
MCV: 85.2 fL (ref 78.0–100.0)
MCV: 85.3 fL (ref 78.0–100.0)
MCV: 85.5 fL (ref 78.0–100.0)
Platelets: 173 10*3/uL (ref 150–400)
Platelets: 211 10*3/uL (ref 150–400)
Platelets: 212 10*3/uL (ref 150–400)
RBC: 2.74 MIL/uL — ABNORMAL LOW (ref 4.22–5.81)
RBC: 3.17 MIL/uL — ABNORMAL LOW (ref 4.22–5.81)
RBC: 3.33 MIL/uL — ABNORMAL LOW (ref 4.22–5.81)
RDW: 15.2 % (ref 11.5–15.5)
RDW: 15.2 % (ref 11.5–15.5)
WBC: 12.3 10*3/uL — ABNORMAL HIGH (ref 4.0–10.5)
WBC: 12.4 10*3/uL — ABNORMAL HIGH (ref 4.0–10.5)
WBC: 9.9 10*3/uL (ref 4.0–10.5)

## 2012-01-06 LAB — GLUCOSE, CAPILLARY: Glucose-Capillary: 107 mg/dL — ABNORMAL HIGH (ref 70–99)

## 2012-01-06 LAB — CK: Total CK: 1537 U/L — ABNORMAL HIGH (ref 7–232)

## 2012-01-06 LAB — PREPARE RBC (CROSSMATCH)

## 2012-01-06 MED ORDER — PHENOL 1.4 % MT LIQD: 1.0000 | OROMUCOSAL | Status: DC | PRN

## 2012-01-06 MED ORDER — HYDROMORPHONE HCL PF 1 MG/ML IJ SOLN
INTRAMUSCULAR | Status: AC
  Administered 2012-01-07: 1 mg via INTRAVENOUS
  Filled 2012-01-06: qty 1

## 2012-01-06 MED ORDER — SODIUM CHLORIDE 0.9 % IJ SOLN
INTRAMUSCULAR | Status: AC
  Filled 2012-01-06: qty 6

## 2012-01-06 MED ORDER — INSULIN REGULAR HUMAN 100 UNIT/ML IJ SOLN
INTRAVENOUS | Status: AC
  Administered 2012-01-06: 18:00:00 via INTRAVENOUS
  Filled 2012-01-06: qty 2000

## 2012-01-06 NOTE — Progress Notes (Signed)
Peter [name]  MRN: [id number]  DOB/AGE: [birth year] 43 y.o.  Primary Care Physician:GOLDING,Janet [name], MD, MD  Admit date: 12/29/2011  Chief Complaint:  Chief Complaint  Patient presents with  . Abdominal Pain  . Emesis  . Diarrhea    S-Pt presented on  12/29/2011 with  Chief Complaint  Patient presents with  . Abdominal Pain  . Emesis  . Diarrhea  .    Pt today feels better    meds    . cloNIDine  0.1 mg Oral BID  . furosemide  60 mg Intravenous BID  . imipenem-cilastatin  500 mg Intravenous Q8H  . insulin aspart  0-20 Units Subcutaneous Q4H  . levalbuterol  0.63 mg Nebulization Q6H  . pantoprazole (PROTONIX) IV  40 mg Intravenous BID  . senna-docusate  2 tablet Oral QHS  . sodium chloride  10-40 mL Intracatheter Q12H  . sodium chloride      . sodium chloride      . thiamine  100 mg Oral Daily  . vancomycin  1,000 mg Intravenous Q12H         JXB:[id number] from the symptoms mentioned above,there are no other symptoms referable to all systems reviewed.  Physical Exam: Vital signs in last 24 hours: Temp:  [97.7 F (36.5 C)-101.4 F (38.6 C)] 100.3 F (37.9 C) (03/31 1200) Pulse Rate:  [103-124] 115  (03/31 1500) Resp:  [11-32] 20  (03/31 1200) BP: (106-152)/(65-95) 127/77 mmHg (03/31 1500) SpO2:  [90 %-100 %] 99 % (03/31 1500) Weight:  [265 lb 3.4 oz (120.3 kg)] 265 lb 3.4 oz (120.3 kg) (03/31 0500) Weight change:  Last BM Date: 01/05/12  Intake/Output from previous day: 03/30 0701 - 03/31 0700 In: 4011 [I.V.:1178.5; Blood:712.5; NG/GT:60; IV Piggyback:720; TPN:1340] Out: 3875 [Urine:3425; Emesis/NG output:450] Total I/O In: 619 [P.O.:80; I.V.:135; IV Piggyback:224; TPN:180] Out: 1650 [Urine:1650]   Physical Exam: General- pt is awake,alert,  Resp- No acute REsp distress, NO Rhonchi , Decreased Breath sounds at bases. CVS- S1S2 regular in rate and rhythm  GIT- BS+, But decreased, Distended +  EXT-Trace LE Edema, NO Cyanosis    Lab  Results: CBC  Basename 01/06/12 1610 01/06/12 0849  WBC 12.3* 12.4*  HGB 9.1* 9.6*  HCT 27.1* 28.4*  PLT 211 212   Hgb  7.9==>9.1   BMET  Basename 01/06/12 0850 01/05/12 0516  NA 139 135  K 3.7 3.8  CL 100 101  CO2 29 26  GLUCOSE 138* 188*  BUN 22 23  CREATININE 1.96* 1.97*  CALCIUM 8.1* 7.4*     Trend  Creat 5.45==>4.53==>3.73==>2.0==>1.97  Calcium 4.8==>5.0==>5.2 ==>6.6==>7.4  Alb 2.6  Sodium 135==>126==>129==>135==>139  MICRO Recent Results (from the past 240 hour(s))  CULTURE, BLOOD (ROUTINE X 2)     Status: Normal (Preliminary result)   Collection Time   12/30/11  1:45 PM      Component Value Range Status Comment   Specimen Description BLOOD LEFT HAND   Final    Special Requests BOTTLES DRAWN AEROBIC ONLY 4CC ONE BOTTLE   Final    Culture NO GROWTH 4 DAYS   Final    Report Status PENDING   Incomplete   MRSA PCR SCREENING     Status: Normal   Collection Time   12/30/11  1:49 PM      Component Value Range Status Comment   MRSA by PCR NEGATIVE  NEGATIVE  Final   CULTURE, BLOOD (ROUTINE X 2)     Status: Normal (Preliminary result)   Collection  Time   12/30/11  2:00 PM      Component Value Range Status Comment   Specimen Description BLOOD LEFT ARM   Final    Special Requests     Final    Value: BOTTLES DRAWN AEROBIC AND ANAEROBIC 4CC EACH BOTTLE   Culture NO GROWTH 4 DAYS   Final    Report Status PENDING   Incomplete   URINE CULTURE     Status: Normal   Collection Time   12/30/11  3:10 PM      Component Value Range Status Comment   Specimen Description URINE, CATHETERIZED   Final    Special Requests NONE   Final    Culture  Setup Time [id number]   Final    Colony Count NO GROWTH   Final    Culture NO GROWTH   Final    Report Status 12/31/2011 FINAL   Final   CULTURE, BLOOD (ROUTINE X 2)     Status: Normal (Preliminary result)   Collection Time   01/05/12  8:04 AM      Component Value Range Status Comment   Specimen Description Blood PICC LINE   Final     Special Requests     Final    Value: BOTTLES DRAWN AEROBIC AND ANAEROBIC 6CC DRAWN BY RN   Culture PENDING   Incomplete    Report Status PENDING   Incomplete   CULTURE, BLOOD (ROUTINE X 2)     Status: Normal (Preliminary result)   Collection Time   01/05/12  8:12 AM      Component Value Range Status Comment   Specimen Description Blood LEFT ANTECUBITAL   Final    Special Requests BOTTLES DRAWN AEROBIC AND ANAEROBIC 7CC   Final    Culture PENDING   Incomplete    Report Status PENDING   Incomplete       Lab Results  Component Value Date   CALCIUM 8.1* 01/06/2012   CAION CANCELLED BY LAB 01/03/2012   PHOS 2.2* 01/05/2012     CK 4524==>1537   Impression: 1)Renal  AKI secondary to Contrast Induced ATN With some contribution from SIRS as Pancreatitits  AKI most Likely Nonoliguric ATN Creatinine is trending down  Non Oliguiric ATN  Not Sure if Crea now plateaued or will trend down more  2)HTN BP at goal  In SIRS state -will not aim for 120's    3)Anemia HGb not at goal (9--11)  Dilutional + Acute sickness+ CT showed Retroperitoneal bleed  Primary following for need of Transfusion    4)GI- Admitted with Pancreatitis  Now better   5)Hypocalcium- Secondary to asso with Fats after Pancreatitis'  Now better   6)HypoNatremia - Now better   7)Acid base  Co2 at goal   8) Volume status       Will follow closely for IVf as pt is SIRS state , positive by 10 liters  , Bleeding ( Retroperitoneum).  Plan:  Agree with reducing IVF Will continue current treatment        Nahomy Limburg S 01/06/2012, 4:57 PM

## 2012-01-06 NOTE — Progress Notes (Signed)
Hemoglobin 7.9, order to give additional unit of PRBC's.

## 2012-01-06 NOTE — Progress Notes (Signed)
Subjective: The patient complains of a sore throat from the NG tube. Patient was given another unit of blood overnight.  Objective: Vital signs in last 24 hours: Filed Vitals:   01/06/12 0500 01/06/12 0600 01/06/12 0700 01/06/12 0800  BP: 148/95 151/91 151/84 147/89  Pulse: 115 114 115 116  Temp: 98.5 F (36.9 C) 98.4 F (36.9 C) 98.4 F (36.9 C) 99.8 F (37.7 C)  TempSrc: Oral Oral Oral Oral  Resp: 17 24 25 22   Height:      Weight: 120.3 kg (265 lb 3.4 oz)     SpO2: 97% 94% 94% 96%    Intake/Output Summary (Last 24 hours) at 01/06/12 0809 Last data filed at 01/06/12 1610  Gross per 24 hour  Intake   3691 ml  Output   3400 ml  Net    291 ml    Weight change:  Physical exam: General: 41 year old obese African recommend sitting up in bed, in no acute distress. Lungs: A few upper airway wheezes, otherwise decreased breath sounds globally. Heart: S1, S2, with tachycardia. Abdomen: Mildly to moderately distended, hypoactive bowel sounds, softer less tender diffusely. Tympanic. Extremities: 1+ bilateral pedal edema. Neuro: Alert & oriented x2.  Lab Results: Basic Metabolic Panel:  Basename 01/05/12 0516 01/04/12 0455 01/03/12 2302  NA 135 129* --  K 3.8 4.6 --  CL 101 98 --  CO2 26 22 --  GLUCOSE 188* 123* --  BUN 23 26* --  CREATININE 1.97* 2.08* --  CALCIUM 7.4* 6.6* --  MG 2.4 -- 1.9  PHOS 2.2* -- --   Liver Function Tests:  [facility] 01/05/12 0516  AST 80*  ALT 25  ALKPHOS 58  BILITOT 0.9  PROT 6.3  ALBUMIN 2.4*    Basename 01/05/12 0516 01/04/12 0455  LIPASE 107* 113*  AMYLASE -- --   No results found for this basename: AMMONIA:2 in the last 72 hours CBC:  Basename 01/06/12 0005 01/05/12 1503 01/05/12 0516  WBC 9.9 10.8* --  NEUTROABS -- -- 8.6*  HGB 7.9* 7.7* --  HCT 23.3* 22.6* --  MCV 85.0 84.6 --  PLT 173 177 --   Cardiac Enzymes:  Basename 01/05/12 0516 01/04/12 0455  CKTOTAL 2147* 3048*  CKMB -- --  CKMBINDEX -- --  TROPONINI --  --   BNP: No results found for this basename: PROBNP:3 in the last 72 hours D-Dimer: No results found for this basename: DDIMER:2 in the last 72 hours CBG:  Basename 01/06/12 0752 01/06/12 0349 01/06/12 0007 01/05/12 1952 01/05/12 1549 01/05/12 1140  GLUCAP 138* 119* 124* 154* 158* 173*   Hemoglobin A1C: No results found for this basename: HGBA1C in the last 72 hours Fasting Lipid Panel:  Basename 01/05/12 0516  CHOL 185  HDL --  LDLCALC --  TRIG 288*  CHOLHDL --  LDLDIRECT --   Thyroid Function Tests: No results found for this basename: TSH,T4TOTAL,FREET4,T3FREE,THYROIDAB in the last 72 hours Anemia Panel:  Basename 01/03/12 1456  VITAMINB12 1188*  FOLATE 15.3  FERRITIN 2243*  TIBC 165*  IRON 25*  RETICCTPCT 2.3   Coagulation: No results found for this basename: LABPROT:2,INR:2 in the last 72 hours Urine Drug Screen: Drugs of Abuse     Component Value Date/Time   LABOPIA POSITIVE* 12/30/2011 1433   COCAINSCRNUR NONE DETECTED 12/30/2011 1433   LABBENZ NONE DETECTED 12/30/2011 1433   AMPHETMU NONE DETECTED 12/30/2011 1433   THCU NONE DETECTED 12/30/2011 1433   LABBARB NONE DETECTED 12/30/2011 1433    Alcohol Level:  No results found for this basename: ETH:2 in the last 72 hours Urinalysis: No results found for this basename: COLORURINE:2,APPERANCEUR:2,LABSPEC:2,PHURINE:2,GLUCOSEU:2,HGBUR:2,BILIRUBINUR:2,KETONESUR:2,PROTEINUR:2,UROBILINOGEN:2,NITRITE:2,LEUKOCYTESUR:2 in the last 72 hours Misc. Labs:   Micro: Recent Results (from the past 240 hour(s))  CULTURE, BLOOD (ROUTINE X 2)     Status: Normal (Preliminary result)   Collection Time   12/30/11  1:45 PM      Component Value Range Status Comment   Specimen Description BLOOD LEFT HAND   Final    Special Requests BOTTLES DRAWN AEROBIC ONLY 4CC ONE BOTTLE   Final    Culture NO GROWTH 4 DAYS   Final    Report Status PENDING   Incomplete   MRSA PCR SCREENING     Status: Normal   Collection Time   12/30/11  1:49 PM       Component Value Range Status Comment   MRSA by PCR NEGATIVE  NEGATIVE  Final   CULTURE, BLOOD (ROUTINE X 2)     Status: Normal (Preliminary result)   Collection Time   12/30/11  2:00 PM      Component Value Range Status Comment   Specimen Description BLOOD LEFT ARM   Final    Special Requests     Final    Value: BOTTLES DRAWN AEROBIC AND ANAEROBIC 4CC EACH BOTTLE   Culture NO GROWTH 4 DAYS   Final    Report Status PENDING   Incomplete   URINE CULTURE     Status: Normal   Collection Time   12/30/11  3:10 PM      Component Value Range Status Comment   Specimen Description URINE, CATHETERIZED   Final    Special Requests NONE   Final    Culture  Setup Time [id number]   Final    Colony Count NO GROWTH   Final    Culture NO GROWTH   Final    Report Status 12/31/2011 FINAL   Final   CULTURE, BLOOD (ROUTINE X 2)     Status: Normal (Preliminary result)   Collection Time   01/05/12  8:04 AM      Component Value Range Status Comment   Specimen Description Blood PICC LINE   Final    Special Requests     Final    Value: BOTTLES DRAWN AEROBIC AND ANAEROBIC 6CC DRAWN BY RN   Culture PENDING   Incomplete    Report Status PENDING   Incomplete   CULTURE, BLOOD (ROUTINE X 2)     Status: Normal (Preliminary result)   Collection Time   01/05/12  8:12 AM      Component Value Range Status Comment   Specimen Description Blood LEFT ANTECUBITAL   Final    Special Requests BOTTLES DRAWN AEROBIC AND ANAEROBIC [facility]   Final    Culture PENDING   Incomplete    Report Status PENDING   Incomplete     Studies/Results: Dg Abd 1 View  01/04/2012  *RADIOLOGY REPORT*  Clinical Data: Abdominal pain.  ABDOMEN - 1 VIEW  Comparison: CT from 01/03/2012.  Abdomen x-ray from 01/03/2012.  Findings: As before, there is diffuse gaseous bowel distention involving large and small intestine.  Maximum small bowel distention is about 6.1 cm today which when remeasuring in the same location on yesterday's study, reveals no  substantial interval change.  IMPRESSION: Persistent diffuse dilatation of large and small bowel as before.  Original Report Authenticated By: ERIC A. MANSELL, M.D.    Medications: I have reviewed the patient's current medications.  Assessment: Principal Problem:  *Acute pancreatitis Active Problems:  HTN (hypertension)  Alcohol abuse  Hepatic steatosis  Alcohol withdrawal  ARF (acute renal failure)  Hyperkalemia  Metabolic acidosis  Hypocalcemia  Fever  Hypertriglyceridemia  Rhabdomyolysis  Sepsis  UTI (urinary tract infection)  Ileus  Anemia  Wheezing  Hypoalbuminemia  Peripheral edema  Retroperitoneal bleed  Hyperglycemia   1. Acute pancreatitis secondary to alcohol abuse and hypertriglyceridemia. His lipase on admission was 1273. Today it is 107. CT of the abdomen and pelvis yesterday revealed progression of acute pancreatitis but no obvious pseudocyst or drainable fluid. ?? of retroperitoneal bleed, per Dr. Darrick Penna discussion with radiology. The patient was advised to stop drinking permanently.  Ileus versus partial small bowel obstruction. This is likely secondary to the severe pancreatitis and associated inflammation. An NG was placed and is draining small amounts of bilious fluid.  Worsening anemia. His hemoglobin on admission was 15.4. It has slowly and progressively decreased, in part due to hemodilution and in part secondary to severe pancreatitis; now with possible retroperitoneal bleed. Hb nadir of 6.7. Lovenox stopped. S/p prbc transfusion of 4 units in all. There is a dilutional aspect because IVFs were increased yesterday.  Will try to keep his Hb around 8 gms as minimum due to possible retroperitoneal bleed.  Thrombocytopenia. Secondary to alcoholism and Sirs. Resolved.  Alcoholic hepatitis. His ALT and bilirubin have normalize. His AST has improved but is still elevated.  Alcohol abuse and resolved alcoholic withdrawal syndrome. The patient was advised to  stop drinking permanently. He is on vitamin therapy and when necessary Ativan.  Sepsis syndrome versus SIRSs secondary to acute pancreatitis. His fever has returned.  His cultures are negative to date. He continues on Primaxin. Added Vanco and re-cultured 01/05/12.  Hypertriglyceridemia. TG level much improved. Will consider starting Lopid at d/c.Marland Kitchen  Rhabdomyolysis. His CK is still in the thousands but improved from admission. He had started lifting weights prior to this hospitalization.  Acute renal failure. Likely secondary to prerenal azotemia and dye nephropathy. His renal function is improving. Dr. Kristian Covey is following. Nephrology increased IVF yesterday but overall the Pt is positive with I/Os because of blood transfusion. On IV Lasix which should continue.  Hyponatremia. Secondary to acute renal failure. IV fluids and Lasix are being adjusted.  Resolved hyperkalemia. Resolved metabolic acidosis.  Hypocalcemia. This is likely secondary to severe pancreatitis. He has received IV calcium per nephrology.  Hypertension and associated tachycardia. He is on clonidine.  Wheezing and atelectasis. Albuterol was discontinued in favor of Xopenex due to the tachycardia. He was given 1 g of magnesium sulfate to see if it would help with upper airway wheezing. Incentive spirometry has been ordered.  Nutrition: TPN started yesterday.  Plan:  1. Change CBC monitoring to Q 12 hours. 2. Decrease IVF. 3. Throat spray. 4. Trial of clamping NG tube and sips & ice chips. 5. Try to ambulate a little daily. 6. Check labs pending.    Total critical care time: 45 minutes.    LOS: 8 days   Shavelle Runkel 01/06/2012, 8:09 AM

## 2012-01-06 NOTE — Consult Note (Signed)
ANTIBIOTIC CONSULT NOTE   Pharmacy Consult for Vancomycin Indication: rule out sepsis  Allergies  Allergen Reactions  . Ace Inhibitors Swelling    Lip swelling   Patient Measurements: Height: 5\' 10"  (177.8 cm) Weight: 265 lb 3.4 oz (120.3 kg) IBW/kg (Calculated) : 73   Vital Signs: Temp: 99.8 F (37.7 C) (03/31 0800) Temp src: Oral (03/31 0800) BP: 134/84 mmHg (03/31 1000) Pulse Rate: 121  (03/31 1000) Intake/Output from previous day: 03/30 0701 - 03/31 0700 In: 4011 [I.V.:1178.5; Blood:712.5; NG/GT:60; IV Piggyback:720; TPN:1340] Out: 3875 [Urine:3425; Emesis/NG output:450] Intake/Output from this shift: Total I/O In: 569 [P.O.:30; I.V.:135; IV Piggyback:224; TPN:180] Out: 850 [Urine:850]  Labs:  [facility] 01/06/12 0850 01/06/12 0849 01/06/12 0005 01/05/12 1503 01/05/12 0516 01/04/12 0455  WBC -- 12.4* 9.9 10.8* -- --  HGB -- 9.6* 7.9* 7.7* -- --  PLT -- 212 173 177 -- --  LABCREA -- -- -- -- -- --  CREATININE 1.96* -- -- -- 1.97* 2.08*   Estimated Creatinine Clearance: 63.2 ml/min (by C-G formula based on Cr of 1.96). No results found for this basename: VANCOTROUGH:2,VANCOPEAK:2,VANCORANDOM:2,GENTTROUGH:2,GENTPEAK:2,GENTRANDOM:2,TOBRATROUGH:2,TOBRAPEAK:2,TOBRARND:2,AMIKACINPEAK:2,AMIKACINTROU:2,AMIKACIN:2, in the last 72 hours   Microbiology: Recent Results (from the past 720 hour(s))  CULTURE, BLOOD (ROUTINE X 2)     Status: Normal (Preliminary result)   Collection Time   12/30/11  1:45 PM      Component Value Range Status Comment   Specimen Description BLOOD LEFT HAND   Final    Special Requests BOTTLES DRAWN AEROBIC ONLY 4CC ONE BOTTLE   Final    Culture NO GROWTH 4 DAYS   Final    Report Status PENDING   Incomplete   MRSA PCR SCREENING     Status: Normal   Collection Time   12/30/11  1:49 PM      Component Value Range Status Comment   MRSA by PCR NEGATIVE  NEGATIVE  Final   CULTURE, BLOOD (ROUTINE X 2)     Status: Normal (Preliminary result)   Collection  Time   12/30/11  2:00 PM      Component Value Range Status Comment   Specimen Description BLOOD LEFT ARM   Final    Special Requests     Final    Value: BOTTLES DRAWN AEROBIC AND ANAEROBIC 4CC EACH BOTTLE   Culture NO GROWTH 4 DAYS   Final    Report Status PENDING   Incomplete   URINE CULTURE     Status: Normal   Collection Time   12/30/11  3:10 PM      Component Value Range Status Comment   Specimen Description URINE, CATHETERIZED   Final    Special Requests NONE   Final    Culture  Setup Time [id number]   Final    Colony Count NO GROWTH   Final    Culture NO GROWTH   Final    Report Status 12/31/2011 FINAL   Final   CULTURE, BLOOD (ROUTINE X 2)     Status: Normal (Preliminary result)   Collection Time   01/05/12  8:04 AM      Component Value Range Status Comment   Specimen Description Blood PICC LINE   Final    Special Requests     Final    Value: BOTTLES DRAWN AEROBIC AND ANAEROBIC 6CC DRAWN BY RN   Culture PENDING   Incomplete    Report Status PENDING   Incomplete   CULTURE, BLOOD (ROUTINE X 2)     Status: Normal (Preliminary result)  Collection Time   01/05/12  8:12 AM      Component Value Range Status Comment   Specimen Description Blood LEFT ANTECUBITAL   Final    Special Requests BOTTLES DRAWN AEROBIC AND ANAEROBIC [facility]   Final    Culture PENDING   Incomplete    Report Status PENDING   Incomplete    Medical History: Past Medical History  Diagnosis Date  . Hypertension   . Ileus 01/03/2012  . Retroperitoneal bleed 01/04/2012   Medications:  Scheduled:     . cloNIDine  0.1 mg Oral BID  . furosemide  60 mg Intravenous BID  . imipenem-cilastatin  500 mg Intravenous Q8H  . insulin aspart  0-20 Units Subcutaneous Q4H  . levalbuterol  0.63 mg Nebulization Q6H  . pantoprazole (PROTONIX) IV  40 mg Intravenous BID  . senna-docusate  2 tablet Oral QHS  . sodium chloride  10-40 mL Intracatheter Q12H  . sodium chloride      . sodium chloride      . thiamine  100 mg  Oral Daily  . vancomycin  1,000 mg Intravenous Q12H   Assessment: SCr improving Also on Primaxin  Goal of Therapy:  Vancomycin trough level 15-20 mcg/ml  Plan: Vancomycin 1gm iv q12hrs Check trough tomorrow  Margo Aye, Omaria Plunk A 01/06/2012,11:11 AM

## 2012-01-06 NOTE — Consult Note (Signed)
PARENTERAL NUTRITION CONSULT NOTE   Pharmacy Consult for TPN Indication: severe pancreatitis  Allergies  Allergen Reactions  . Ace Inhibitors Swelling    Lip swelling   Patient Measurements: Height: 5\' 10"  (177.8 cm) Weight: 265 lb 3.4 oz (120.3 kg) IBW/kg (Calculated) : 73  Adjusted Body Weight: 87KG  Vital Signs: Temp: 99.8 F (37.7 C) (03/31 0800) Temp src: Oral (03/31 0800) BP: 134/84 mmHg (03/31 1000) Pulse Rate: 121  (03/31 1000) Intake/Output from previous day: 03/30 0701 - 03/31 0700 In: 4011 [I.V.:1178.5; Blood:712.5; NG/GT:60; IV Piggyback:720; TPN:1340] Out: 3875 [Urine:3425; Emesis/NG output:450] Intake/Output from this shift: Total I/O In: 569 [P.O.:30; I.V.:135; IV Piggyback:224; TPN:180] Out: 850 [Urine:850]  Labs:  [facility] 01/06/12 0849 01/06/12 0005 01/05/12 1503  WBC 12.4* 9.9 10.8*  HGB 9.6* 7.9* 7.7*  HCT 28.4* 23.3* 22.6*  PLT 212 173 177  APTT -- -- --  INR -- -- --    Basename 01/06/12 0850 01/05/12 0516 01/04/12 0455 01/03/12 2302  NA 139 135 129* --  K 3.7 3.8 4.6 --  CL 100 101 98 --  CO2 29 26 22  --  GLUCOSE 138* 188* 123* --  BUN 22 23 26* --  CREATININE 1.96* 1.97* 2.08* --  LABCREA -- -- -- --  CREAT24HRUR -- -- -- --  CALCIUM 8.1* 7.4* 6.6* --  MG -- 2.4 -- 1.9  PHOS -- 2.2* -- --  PROT -- 6.3 -- --  ALBUMIN -- 2.4* -- --  AST -- 80* -- --  ALT -- 25 -- --  ALKPHOS -- 58 -- --  BILITOT -- 0.9 -- --  BILIDIR -- -- -- --  IBILI -- -- -- --  PREALBUMIN -- 6.0* -- --  TRIG -- 288* -- --  CHOLHDL -- -- -- --  CHOL -- 185 -- --   Estimated Creatinine Clearance: 63.2 ml/min (by C-G formula based on Cr of 1.96).    Basename 01/06/12 0752 01/06/12 0349 01/06/12 0007  GLUCAP 138* 119* 124*   Medical History: Past Medical History  Diagnosis Date  . Hypertension   . Ileus 01/03/2012  . Retroperitoneal bleed 01/04/2012   Medications:  Scheduled:     . cloNIDine  0.1 mg Oral BID  . furosemide  60 mg Intravenous BID    . imipenem-cilastatin  500 mg Intravenous Q8H  . insulin aspart  0-20 Units Subcutaneous Q4H  . levalbuterol  0.63 mg Nebulization Q6H  . pantoprazole (PROTONIX) IV  40 mg Intravenous BID  . senna-docusate  2 tablet Oral QHS  . sodium chloride  10-40 mL Intracatheter Q12H  . sodium chloride      . sodium chloride      . thiamine  100 mg Oral Daily  . vancomycin  1,000 mg Intravenous Q12H   Insulin Requirements in the past 24 hours:  SSI + insulin in TPN (~ 12 units given)  Current Nutrition:  TPN at 74ml/hr  Assessment: Pt reportedly requesting food Obesity Severe pancreatitis Hypoalbuminemia Hyponatremia resolved Hypocalcemia resolved  Hyperglycemia due to TPN, improving SCr improving  Nutritional Goals:  2000-2100 kCal, 100-120 grams of protein per day  Plan: Continue TPN 61ml/hr today (Clinimix E 5/15) NO LIPID (FAT) INFUSION per Dr Darrick Penna (d/w MD) Add regular insulin 20 units/Liter (40 units per bag) Continue IVF (saline), rate per MD MVI and Trace Elements every Mon-Wed-Fri (shortage) F/U Lytes, fluid status, renal fxn, glucose tolerance tomorrow  Margo Aye, Dantonio Justen A 01/06/2012,11:12 AM

## 2012-01-06 NOTE — Progress Notes (Signed)
Pt ambulated with standby assist from bed to nursing stations and from nursing station back to bed. Pt became fatigued easily but was able to independently ambulated back to bed. Wife assisting patient with RN.

## 2012-01-07 ENCOUNTER — Inpatient Hospital Stay (HOSPITAL_COMMUNITY): Payer: Self-pay

## 2012-01-07 DIAGNOSIS — R6510 Systemic inflammatory response syndrome (SIRS) of non-infectious origin without acute organ dysfunction: Secondary | ICD-10-CM

## 2012-01-07 DIAGNOSIS — K859 Acute pancreatitis without necrosis or infection, unspecified: Secondary | ICD-10-CM

## 2012-01-07 LAB — COMPREHENSIVE METABOLIC PANEL
ALT: 28 U/L (ref 0–53)
AST: 80 U/L — ABNORMAL HIGH (ref 0–37)
Albumin: 2.3 g/dL — ABNORMAL LOW (ref 3.5–5.2)
CO2: 30 mEq/L (ref 19–32)
Calcium: 8.4 mg/dL (ref 8.4–10.5)
Chloride: 98 mEq/L (ref 96–112)
Creatinine, Ser: 1.87 mg/dL — ABNORMAL HIGH (ref 0.50–1.35)
GFR calc non Af Amer: 42 mL/min — ABNORMAL LOW (ref >90)
Sodium: 136 mEq/L (ref 135–145)

## 2012-01-07 LAB — PHOSPHORUS: Phosphorus: 2.2 mg/dL — ABNORMAL LOW (ref 2.3–4.6)

## 2012-01-07 LAB — TYPE AND SCREEN
ABO/RH(D): A POS
Antibody Screen: NEGATIVE
Unit division: 0
Unit division: 0

## 2012-01-07 LAB — CBC
HCT: 26.1 % — ABNORMAL LOW (ref 39.0–52.0)
Hemoglobin: 8.7 g/dL — ABNORMAL LOW (ref 13.0–17.0)
Hemoglobin: 9.2 g/dL — ABNORMAL LOW (ref 13.0–17.0)
Hemoglobin: 9.3 g/dL — ABNORMAL LOW (ref 13.0–17.0)
MCHC: 33.3 g/dL (ref 30.0–36.0)
MCV: 85.6 fL (ref 78.0–100.0)
MCV: 86 fL (ref 78.0–100.0)
MCV: 85.9 fL (ref 78.0–100.0)
Platelets: 239 10*3/uL (ref 150–400)
Platelets: 283 10*3/uL (ref 150–400)
RBC: 3.15 MIL/uL — ABNORMAL LOW (ref 4.22–5.81)
RBC: 3.27 MIL/uL — ABNORMAL LOW (ref 4.22–5.81)
RDW: 15 % (ref 11.5–15.5)
WBC: 13.2 10*3/uL — ABNORMAL HIGH (ref 4.0–10.5)
WBC: 12.7 10*3/uL — ABNORMAL HIGH (ref 4.0–10.5)
WBC: 12.1 10*3/uL — ABNORMAL HIGH (ref 4.0–10.5)
WBC: 11 10*3/uL — ABNORMAL HIGH (ref 4.0–10.5)

## 2012-01-07 LAB — GLUCOSE, CAPILLARY
Glucose-Capillary: 115 mg/dL — ABNORMAL HIGH (ref 70–99)
Glucose-Capillary: 125 mg/dL — ABNORMAL HIGH (ref 70–99)
Glucose-Capillary: 134 mg/dL — ABNORMAL HIGH (ref 70–99)
Glucose-Capillary: 134 mg/dL — ABNORMAL HIGH (ref 70–99)
Glucose-Capillary: 136 mg/dL — ABNORMAL HIGH (ref 70–99)

## 2012-01-07 LAB — DIFFERENTIAL
Basophils Absolute: 0 10*3/uL (ref 0.0–0.1)
Eosinophils Relative: 1 % (ref 0–5)
Lymphocytes Relative: 6 % — ABNORMAL LOW (ref 12–46)
Lymphs Abs: 0.8 10*3/uL (ref 0.7–4.0)
Monocytes Absolute: 0.5 10*3/uL (ref 0.1–1.0)
Monocytes Relative: 4 % (ref 3–12)
Neutro Abs: 11.7 10*3/uL — ABNORMAL HIGH (ref 1.7–7.7)

## 2012-01-07 LAB — PREALBUMIN: Prealbumin: 5.4 mg/dL — ABNORMAL LOW (ref 17.0–34.0)

## 2012-01-07 LAB — VANCOMYCIN, TROUGH: Vancomycin Tr: 8.8 ug/mL — ABNORMAL LOW (ref 10.0–20.0)

## 2012-01-07 MED ORDER — TRACE MINERALS CR-CU-MN-SE-ZN 10-1000-500-60 MCG/ML IV SOLN
INTRAVENOUS | Status: AC
  Administered 2012-01-07: 18:00:00 via INTRAVENOUS
  Filled 2012-01-07: qty 2000

## 2012-01-07 MED ORDER — VANCOMYCIN HCL 1000 MG IV SOLR
1250.0000 mg | Freq: Two times a day (BID) | INTRAVENOUS | Status: DC
  Administered 2012-01-07 – 2012-01-11 (×8): 1250 mg via INTRAVENOUS
  Filled 2012-01-07 (×14): qty 1250

## 2012-01-07 MED ORDER — FLUCONAZOLE IN SODIUM CHLORIDE 200-0.9 MG/100ML-% IV SOLN
200.0000 mg | INTRAVENOUS | Status: DC
  Administered 2012-01-07 – 2012-01-11 (×5): 200 mg via INTRAVENOUS
  Filled 2012-01-07 (×8): qty 100

## 2012-01-07 MED ORDER — POLYETHYLENE GLYCOL 3350 17 G PO PACK
17.0000 g | PACK | Freq: Every day | ORAL | Status: DC
  Administered 2012-01-07 – 2012-01-09 (×3): 17 g via ORAL
  Filled 2012-01-07 (×3): qty 1

## 2012-01-07 NOTE — Consult Note (Signed)
ANTIBIOTIC CONSULT NOTE   Pharmacy Consult for Vancomycin, Diflucan added 4/1 Indication: rule out sepsis, fevers, central line, TPN  Allergies  Allergen Reactions  . Ace Inhibitors Swelling    Lip swelling   Patient Measurements: Height: 5\' 10"  (177.8 cm) Weight: 260 lb 9.3 oz (118.2 kg) IBW/kg (Calculated) : 73   Vital Signs: Temp: 99 F (37.2 C) (04/01 0747) Temp src: Oral (04/01 0747) BP: 132/87 mmHg (04/01 0900) Pulse Rate: 116  (04/01 0800) Intake/Output from previous day: 03/31 0701 - 04/01 0700 In: 2842 [P.O.:335; I.V.:355; IV Piggyback:730; TPN:1422] Out: 3750 [Urine:3750] Intake/Output from this shift:    Labs:  Basename 01/07/12 0503 01/06/12 2214 01/06/12 1610 01/06/12 0850 01/05/12 0516  WBC 13.2* 12.0* 12.3* -- --  HGB 8.7* 9.0* 9.1* -- --  PLT 255 230 211 -- --  LABCREA -- -- -- -- --  CREATININE 1.87* -- -- 1.96* 1.97*   Estimated Creatinine Clearance: 65.6 ml/min (by C-G formula based on Cr of 1.87).  Basename 01/07/12 0734  VANCOTROUGH 8.8*  VANCOPEAK --  Drue Dun --  GENTTROUGH --  GENTPEAK --  GENTRANDOM --  TOBRATROUGH --  TOBRAPEAK --  TOBRARND --  AMIKACINPEAK --  AMIKACINTROU --  AMIKACIN --    Microbiology: Recent Results (from the past 720 hour(s))  CULTURE, BLOOD (ROUTINE X 2)     Status: Normal (Preliminary result)   Collection Time   12/30/11  1:45 PM      Component Value Range Status Comment   Specimen Description BLOOD LEFT HAND   Final    Special Requests BOTTLES DRAWN AEROBIC ONLY 4CC ONE BOTTLE   Final    Culture NO GROWTH 4 DAYS   Final    Report Status PENDING   Incomplete   MRSA PCR SCREENING     Status: Normal   Collection Time   12/30/11  1:49 PM      Component Value Range Status Comment   MRSA by PCR NEGATIVE  NEGATIVE  Final   CULTURE, BLOOD (ROUTINE X 2)     Status: Normal (Preliminary result)   Collection Time   12/30/11  2:00 PM      Component Value Range Status Comment   Specimen Description BLOOD  LEFT ARM   Final    Special Requests     Final    Value: BOTTLES DRAWN AEROBIC AND ANAEROBIC 4CC EACH BOTTLE   Culture NO GROWTH 4 DAYS   Final    Report Status PENDING   Incomplete   URINE CULTURE     Status: Normal   Collection Time   12/30/11  3:10 PM      Component Value Range Status Comment   Specimen Description URINE, CATHETERIZED   Final    Special Requests NONE   Final    Culture  Setup Time [id number]   Final    Colony Count NO GROWTH   Final    Culture NO GROWTH   Final    Report Status 12/31/2011 FINAL   Final   CULTURE, BLOOD (ROUTINE X 2)     Status: Normal (Preliminary result)   Collection Time   01/05/12  8:04 AM      Component Value Range Status Comment   Specimen Description Blood PICC LINE   Final    Special Requests     Final    Value: BOTTLES DRAWN AEROBIC AND ANAEROBIC 6CC DRAWN BY RN   Culture PENDING   Incomplete    Report Status PENDING  Incomplete   CULTURE, BLOOD (ROUTINE X 2)     Status: Normal (Preliminary result)   Collection Time   01/05/12  8:12 AM      Component Value Range Status Comment   Specimen Description Blood LEFT ANTECUBITAL   Final    Special Requests BOTTLES DRAWN AEROBIC AND ANAEROBIC [facility]   Final    Culture PENDING   Incomplete    Report Status PENDING   Incomplete    Medical History: Past Medical History  Diagnosis Date  . Hypertension   . Ileus 01/03/2012  . Retroperitoneal bleed 01/04/2012   Medications:  Scheduled:     . cloNIDine  0.1 mg Oral BID  . fluconazole (DIFLUCAN) IV  200 mg Intravenous Q24H  . furosemide  60 mg Intravenous BID  . HYDROmorphone      . imipenem-cilastatin  500 mg Intravenous Q8H  . insulin aspart  0-20 Units Subcutaneous Q4H  . levalbuterol  0.63 mg Nebulization Q6H  . pantoprazole (PROTONIX) IV  40 mg Intravenous BID  . polyethylene glycol  17 g Oral Daily  . senna-docusate  2 tablet Oral QHS  . sodium chloride  10-40 mL Intracatheter Q12H  . sodium chloride      . thiamine  100 mg Oral  Daily  . vancomycin  1,250 mg Intravenous Q12H  . DISCONTD: vancomycin  1,000 mg Intravenous Q12H   Assessment: SCr improving (ClCr > 60) Vancomycin trough level below goal  Goal of Therapy:  Vancomycin trough level 15-20 mcg/ml  Plan: Increase Vancomycin to 1250mg  iv q12hrs Re-Check trough at steady state Diflucan 200mg  iv q24hrs Labs per protocol  Peter [name] A 01/07/2012,10:17 AM

## 2012-01-07 NOTE — Progress Notes (Signed)
PT BECOMES SEVERELY SOB W/ ANY EXERTION.ESPECIALLY WHEN STANDING TO VOID OR TRANSFER TO BSC.

## 2012-01-07 NOTE — Progress Notes (Signed)
Peter [name]  MRN: [id number]  DOB/AGE: [id number] y.o.  Primary Care Physician:GOLDING,Larkin [name], MD, MD  Admit date: 12/29/2011  Chief Complaint:  Chief Complaint  Patient presents with  . Abdominal Pain  . Emesis  . Diarrhea    [name] presented on  12/29/2011 with  Chief Complaint  Patient presents with  . Abdominal Pain  . Emesis  . Diarrhea  .    Pt today feels better      . cloNIDine  0.1 mg Oral BID  . fluconazole (DIFLUCAN) IV  200 mg Intravenous Q24H  . furosemide  60 mg Intravenous BID  . HYDROmorphone      . imipenem-cilastatin  500 mg Intravenous Q8H  . insulin aspart  0-20 Units Subcutaneous Q4H  . levalbuterol  0.63 mg Nebulization Q6H  . pantoprazole (PROTONIX) IV  40 mg Intravenous BID  . polyethylene glycol  17 g Oral Daily  . senna-docusate  2 tablet Oral QHS  . sodium chloride  10-40 mL Intracatheter Q12H  . sodium chloride      . thiamine  100 mg Oral Daily  . vancomycin  1,250 mg Intravenous Q12H  . DISCONTD: vancomycin  1,000 mg Intravenous Q12H         WUJ:WJXBJ from the symptoms mentioned above,there are no other symptoms referable to all systems reviewed.  Physical Exam: Vital signs in last 24 hours: Temp:  [98.5 F (36.9 C)-102.5 F (39.2 C)] 100.7 F (38.2 C) (04/01 1119) Pulse Rate:  [111-130] 116  (04/01 0800) Resp:  [13-29] 19  (04/01 0700) BP: (113-145)/(66-95) 132/87 mmHg (04/01 0900) SpO2:  [89 %-99 %] 95 % (04/01 0800) Weight:  [260 lb 9.3 oz (118.2 kg)] 260 lb 9.3 oz (118.2 kg) (04/01 0500) Weight change: -4 lb 10.1 oz (-2.1 kg) Last BM Date: 01/05/12  Intake/Output from previous day: 03/31 0701 - 04/01 0700 In: 2842 [P.O.:335; I.V.:355; IV Piggyback:730; TPN:1422] Out: 3750 [Urine:3750] Total I/O In: 0  Out: 601 [Urine:600; Stool:1]   Physical Exam: General- pt is awake,alert,. Resp- No acute REsp distress, decreased BS at bases. CVS- S1S2 regular in rate and rhythm GIT- BS+,Hypoactive,  distended EXT- 1+ LE Edema, Cyanosis   Lab Results: CBC  Basename 01/07/12 1006 01/07/12 0503  WBC 12.7* 13.2*  HGB 9.0* 8.7*  HCT 27.1* 26.1*  PLT 239 255    BMET  Basename 01/07/12 0503 01/06/12 0850  NA 136 139  K 3.6 3.7  CL 98 100  CO2 30 29  GLUCOSE 133* 138*  BUN 21 22  CREATININE 1.87* 1.96*  CALCIUM 8.4 8.1*    Trend  Creat 5.45==>4.53==>3.73==>2.0==>1.97 ==>1.87 Calcium 4.8==>5.0==>5.2 ==>6.6==>7.4 ==>8.4 Alb 2.6  Sodium 135==>126==>129==>135==>139==>136    MICRO Recent Results (from the past 240 hour([name]))  CULTURE, BLOOD (ROUTINE X 2)     Status: Normal (Preliminary result)   Collection Time   12/30/11  1:45 PM      Component Value Range Status Comment   Specimen Description BLOOD LEFT HAND   Final    Special Requests BOTTLES DRAWN AEROBIC ONLY 4CC ONE BOTTLE   Final    Culture NO GROWTH 4 DAYS   Final    Report Status PENDING   Incomplete   MRSA PCR SCREENING     Status: Normal   Collection Time   12/30/11  1:49 PM      Component Value Range Status Comment   MRSA by PCR NEGATIVE  NEGATIVE  Final   CULTURE, BLOOD (ROUTINE X 2)  Status: Normal (Preliminary result)   Collection Time   12/30/11  2:00 PM      Component Value Range Status Comment   Specimen Description BLOOD LEFT ARM   Final    Special Requests     Final    Value: BOTTLES DRAWN AEROBIC AND ANAEROBIC 4CC EACH BOTTLE   Culture NO GROWTH 4 DAYS   Final    Report Status PENDING   Incomplete   URINE CULTURE     Status: Normal   Collection Time   12/30/11  3:10 PM      Component Value Range Status Comment   Specimen Description URINE, CATHETERIZED   Final    Special Requests NONE   Final    Culture  Setup Time [id number]   Final    Colony Count NO GROWTH   Final    Culture NO GROWTH   Final    Report Status 12/31/2011 FINAL   Final   CULTURE, BLOOD (ROUTINE X 2)     Status: Normal (Preliminary result)   Collection Time   01/05/12  8:04 AM      Component Value Range Status Comment    Specimen Description Blood PICC LINE   Final    Special Requests     Final    Value: BOTTLES DRAWN AEROBIC AND ANAEROBIC 6CC DRAWN BY RN   Culture PENDING   Incomplete    Report Status PENDING   Incomplete   CULTURE, BLOOD (ROUTINE X 2)     Status: Normal (Preliminary result)   Collection Time   01/05/12  8:12 AM      Component Value Range Status Comment   Specimen Description Blood LEFT ANTECUBITAL   Final    Special Requests BOTTLES DRAWN AEROBIC AND ANAEROBIC 7CC   Final    Culture PENDING   Incomplete    Report Status PENDING   Incomplete       Lab Results  Component Value Date   CALCIUM 8.4 01/07/2012   CAION CANCELLED BY LAB 01/03/2012   PHOS 2.2* 01/07/2012        Impression: 1)Renal  AKI secondary to Contrast Induced ATN With some contribution from SIRS as Pancreatitits  AKI most Likely Nonoliguric ATN  Creatinine is trending down  Non Oliguiric ATN   2)HTN BP at goal  In SIRS state -will not aim for 120'[name]   3)Anemia HGb not at goal (9--11)  Dilutional + Acute sickness+ CT showed Retroperitoneal bleed  Primary following for need of Transfusion   4)GI- Admitted with Pancreatitis  Now better   5)Hypocalcium- Secondary to asso with Fats after Pancreatitis'  Now better   6)HypoNatremia - Now better   7)Acid base  Co2 at goal   8) ID Pt febrile         Now started on IV Diflucan.       Plan:  Pt is SIRS/ Septic state. Pt volume status needs to be positive Pt on Lasix  And IVF as ( + 10 liters) since admission but now with being febrile again Will  increase IVF.In case this fever subsides-will decrease IVf .     Peter [name] 01/07/2012, 12:28 PM

## 2012-01-07 NOTE — Progress Notes (Signed)
Subjective: The NG tube was taken out yesterday after it was clamped and the patient tolerated 2 popsicles. He continues to have abdominal distention and discomfort from it. He has not had a bowel movement in 3 days. He ambulated a couple of times outside of the room yesterday.  Objective: Vital signs in last 24 hours: Filed Vitals:   01/07/12 0600 01/07/12 0641 01/07/12 0736 01/07/12 0747  BP: 129/95     Pulse: 115 130    Temp: 102.5 F (39.2 C)   99 F (37.2 C)  TempSrc: Oral   Oral  Resp: 20 26    Height:      Weight:      SpO2: 96% 89% 96%     Intake/Output Summary (Last 24 hours) at 01/07/12 1191 Last data filed at 01/07/12 0800  Gross per 24 hour  Intake   2683 ml  Output   3300 ml  Net   -617 ml    Weight change: -2.1 kg (-4 lb 10.1 oz) Physical exam: General: 41 year old obese African recommend sitting up in bed, in no acute distress. Lungs: Resolution of wheezing, decreased breath sounds in the bases.Marland Kitchen Heart: S1, S2, with tachycardia. Abdomen: Mildly to moderately distended, hypoactive bowel sounds, mildly tender diffusely. Tympanic. Extremities: 1+ bilateral pedal edema. Right upper extremity PICC site without edema, erythema, discharge, or tenderness. Neuro: Alert & oriented x2.  Lab Results: Basic Metabolic Panel:  Basename 01/07/12 0503 01/06/12 0850 01/05/12 0516  NA 136 139 --  K 3.6 3.7 --  CL 98 100 --  CO2 30 29 --  GLUCOSE 133* 138* --  BUN 21 22 --  CREATININE 1.87* 1.96* --  CALCIUM 8.4 8.1* --  MG 2.3 -- 2.4  PHOS 2.2* -- 2.2*   Liver Function Tests:  [facility] 01/07/12 0503 01/05/12 0516  AST 80* 80*  ALT 28 25  ALKPHOS 57 58  BILITOT 0.8 0.9  PROT 6.7 6.3  ALBUMIN 2.3* 2.4*    Basename 01/07/12 0503 01/06/12 0849  LIPASE 108* 107*  AMYLASE -- --   No results found for this basename: AMMONIA:2 in the last 72 hours CBC:  Basename 01/07/12 0503 01/06/12 2214 01/05/12 0516  WBC 13.2* 12.0* --  NEUTROABS 11.7* -- 8.6*  HGB 8.7*  9.0* --  HCT 26.1* 26.5* --  MCV 85.6 85.2 --  PLT 255 230 --   Cardiac Enzymes:  Basename 01/07/12 0503 01/06/12 0849 01/05/12 0516  CKTOTAL 1224* 1537* 2147*  CKMB -- -- --  CKMBINDEX -- -- --  TROPONINI -- -- --   BNP: No results found for this basename: PROBNP:3 in the last 72 hours D-Dimer: No results found for this basename: DDIMER:2 in the last 72 hours CBG:  Basename 01/07/12 0729 01/07/12 0557 01/07/12 0006 01/06/12 2002 01/06/12 1542 01/06/12 1145  GLUCAP 134* 136* 125* 107* 151* 119*   Hemoglobin A1C: No results found for this basename: HGBA1C in the last 72 hours Fasting Lipid Panel:  Basename 01/07/12 0504  CHOL 149  HDL --  LDLCALC --  TRIG 203*  CHOLHDL --  LDLDIRECT --   Thyroid Function Tests: No results found for this basename: TSH,T4TOTAL,FREET4,T3FREE,THYROIDAB in the last 72 hours Anemia Panel: No results found for this basename: VITAMINB12,FOLATE,FERRITIN,TIBC,IRON,RETICCTPCT in the last 72 hours Coagulation: No results found for this basename: LABPROT:2,INR:2 in the last 72 hours Urine Drug Screen: Drugs of Abuse     Component Value Date/Time   LABOPIA POSITIVE* 12/30/2011 1433   COCAINSCRNUR NONE DETECTED 12/30/2011 1433  LABBENZ NONE DETECTED 12/30/2011 1433   AMPHETMU NONE DETECTED 12/30/2011 1433   THCU NONE DETECTED 12/30/2011 1433   LABBARB NONE DETECTED 12/30/2011 1433    Alcohol Level: No results found for this basename: ETH:2 in the last 72 hours Urinalysis: No results found for this basename: COLORURINE:2,APPERANCEUR:2,LABSPEC:2,PHURINE:2,GLUCOSEU:2,HGBUR:2,BILIRUBINUR:2,KETONESUR:2,PROTEINUR:2,UROBILINOGEN:2,NITRITE:2,LEUKOCYTESUR:2 in the last 72 hours Misc. Labs:   Micro: Recent Results (from the past 240 hour(s))  CULTURE, BLOOD (ROUTINE X 2)     Status: Normal (Preliminary result)   Collection Time   12/30/11  1:45 PM      Component Value Range Status Comment   Specimen Description BLOOD LEFT HAND   Final    Special  Requests BOTTLES DRAWN AEROBIC ONLY 4CC ONE BOTTLE   Final    Culture NO GROWTH 4 DAYS   Final    Report Status PENDING   Incomplete   MRSA PCR SCREENING     Status: Normal   Collection Time   12/30/11  1:49 PM      Component Value Range Status Comment   MRSA by PCR NEGATIVE  NEGATIVE  Final   CULTURE, BLOOD (ROUTINE X 2)     Status: Normal (Preliminary result)   Collection Time   12/30/11  2:00 PM      Component Value Range Status Comment   Specimen Description BLOOD LEFT ARM   Final    Special Requests     Final    Value: BOTTLES DRAWN AEROBIC AND ANAEROBIC 4CC EACH BOTTLE   Culture NO GROWTH 4 DAYS   Final    Report Status PENDING   Incomplete   URINE CULTURE     Status: Normal   Collection Time   12/30/11  3:10 PM      Component Value Range Status Comment   Specimen Description URINE, CATHETERIZED   Final    Special Requests NONE   Final    Culture  Setup Time [id number]   Final    Colony Count NO GROWTH   Final    Culture NO GROWTH   Final    Report Status 12/31/2011 FINAL   Final   CULTURE, BLOOD (ROUTINE X 2)     Status: Normal (Preliminary result)   Collection Time   01/05/12  8:04 AM      Component Value Range Status Comment   Specimen Description Blood PICC LINE   Final    Special Requests     Final    Value: BOTTLES DRAWN AEROBIC AND ANAEROBIC 6CC DRAWN BY RN   Culture PENDING   Incomplete    Report Status PENDING   Incomplete   CULTURE, BLOOD (ROUTINE X 2)     Status: Normal (Preliminary result)   Collection Time   01/05/12  8:12 AM      Component Value Range Status Comment   Specimen Description Blood LEFT ANTECUBITAL   Final    Special Requests BOTTLES DRAWN AEROBIC AND ANAEROBIC 7CC   Final    Culture PENDING   Incomplete    Report Status PENDING   Incomplete     Studies/Results: No results found.  Medications: I have reviewed the patient's current medications.  Assessment: Principal Problem:  *Acute pancreatitis Active Problems:  HTN  (hypertension)  Alcohol abuse  Hepatic steatosis  Alcohol withdrawal  ARF (acute renal failure)  Hyperkalemia  Metabolic acidosis  Hypocalcemia  Fever  Hypertriglyceridemia  Rhabdomyolysis  Sepsis  UTI (urinary tract infection)  Ileus  Anemia  Wheezing  Hypoalbuminemia  Peripheral edema  Retroperitoneal  bleed  Hyperglycemia   1. Acute pancreatitis secondary to alcohol abuse and hypertriglyceridemia. His lipase on admission was 1273. Today it is 108.. CT of the abdomen and pelvis on 01/04/2012 revealed progression of acute pancreatitis but no obvious pseudocyst or drainable fluid. ?? of retroperitoneal bleed, per Dr. Darrick Penna' discussion with radiology. The patient was advised to stop drinking permanently.  Ileus versus partial small bowel obstruction. This is likely secondary to the severe pancreatitis and associated inflammation. An NG was placed but was discontinued yesterday as the bilious output had decreased and the patient was able to tolerate sips of clear liquids. He has not had a bowel movement in 3 days, however.  Worsening anemia. His hemoglobin on admission was 15.4. It has slowly and progressively decreased, in part due to hemodilution and in part secondary to severe pancreatitis; and with possible retroperitoneal bleed. Hb nadir of 6.7. Lovenox stopped. S/p prbc transfusion of 4 units in all. There is a dilutional aspect.  Will try to keep his Hb around 8 gms as minimum due to possible retroperitoneal bleed.  Thrombocytopenia. Secondary to alcoholism and Sirs. Resolved.  Alcoholic hepatitis. His ALT and bilirubin have normalize. His AST has improved but is still elevated.  Alcohol abuse and resolved alcoholic withdrawal syndrome. The patient was advised to stop drinking permanently. He is on vitamin therapy and when necessary Ativan.  Sepsis syndrome versus SIRSs secondary to acute pancreatitis. His fever has returned. His PICC site looks good. Vancomycin was added  01/05/2012 and he was recultured. Given that TPN has been started, IV Diflucan will be started anteriorly. He continues on Primaxin.   Hypertriglyceridemia. TG level is much improved. Lopid may not be needed at all.  Rhabdomyolysis. His CK is still in the thousands but improved from admission. He had started lifting weights prior to this hospitalization.  Acute renal failure. Likely secondary to prerenal azotemia and dye nephropathy. His renal function is improving significantly. Dr. Kristian Covey is following. He remains on IV Lasix.  Hyponatremia. Resolved. Secondary to acute renal failure. IV fluids and Lasix were adjusted.  Resolved hyperkalemia. Resolved metabolic acidosis.  Hypocalcemia. Resolved. This is likely secondary to severe pancreatitis. He is status post IV calcium per nephrology.  Hypertension and associated tachycardia. He is on clonidine.  Wheezing and atelectasis. Resolving. Albuterol was discontinued in favor of Xopenex due to the tachycardia. He was given 1 g of magnesium sulfate to see if it would help with upper airway wheezing. Incentive spirometry has been ordered.  Nutrition: TPN started on 01/04/2012.Marland Kitchen  Plan:  1. Start IV Diflucan empirically. 2. The Dulcolax suppository and sips of MiraLax today. Continue ambulation as tolerated. Will check another KUB. 3. We'll get another set of blood cultures if his temperature increases again above 101. 4. Await follow recommendations from nephrology and GI. 5. Continue every 12 hours CBCs.        LOS: 9 days   Wray Goehring 01/07/2012, 8:33 AM

## 2012-01-07 NOTE — Plan of Care (Signed)
Problem: Phase II Progression Outcomes Goal: Progress activity as tolerated unless otherwise ordered Outcome: Progressing Patient ambulated 58ft to exit door in hallway and back to bed.  SOB and abdominal pain. HR elevated to 130 during ambulation, at rest 115-122.  Oxygen saturation 92% during walk.  Goal: Tolerates PO clear liquids Outcome: Completed/Met Date Met:  01/07/12 Patient has tolerated  popsicles and ice chips well

## 2012-01-07 NOTE — Progress Notes (Signed)
INITIAL ADULT NUTRITION ASSESSMENT Date: 01/07/2012   Time: 11:25 AM Reason for Assessment: TPN  ASSESSMENT: Male 41 y.o.  Dx: Acute pancreatitis   Past Medical History  Diagnosis Date  . Hypertension   . Ileus 01/03/2012  . Retroperitoneal bleed 01/04/2012    Scheduled Meds:   . cloNIDine  0.1 mg Oral BID  . fluconazole (DIFLUCAN) IV  200 mg Intravenous Q24H  . furosemide  60 mg Intravenous BID  . HYDROmorphone      . imipenem-cilastatin  500 mg Intravenous Q8H  . insulin aspart  0-20 Units Subcutaneous Q4H  . levalbuterol  0.63 mg Nebulization Q6H  . pantoprazole (PROTONIX) IV  40 mg Intravenous BID  . polyethylene glycol  17 g Oral Daily  . senna-docusate  2 tablet Oral QHS  . sodium chloride  10-40 mL Intracatheter Q12H  . sodium chloride      . thiamine  100 mg Oral Daily  . vancomycin  1,250 mg Intravenous Q12H  . DISCONTD: vancomycin  1,000 mg Intravenous Q12H   Continuous Infusions:   . sodium chloride 30 mL/hr at 01/06/12 0800  . TPN (CLINIMIX) +/- additives 60 mL/hr at 01/06/12 1000  . TPN (CLINIMIX) +/- additives 60 mL/hr at 01/07/12 0642  . TPN (CLINIMIX) +/- additives     PRN Meds:.acetaminophen, albuterol, bisacodyl, HYDROmorphone, LORazepam, ondansetron (ZOFRAN) IV, phenol, sodium chloride  Ht: 5\' 10"  (177.8 cm)  Wt: 260 lb 9.3 oz (118.2 kg)  Ideal Wt:  73 kg % Ideal Wt: 162%  Body mass index is 37.39 kg/(m^2). Obesity Class 2  Food/Nutrition Related Hx:Pt NPO with TPN currently @ 60 ml/hr tolerating and today advanced to 70 ml/hr next bag. At 70 ml/hr TPN will provide 1193 kcal, 84 gr AA. No lipids per Dr. Darrick Penna.   CMP     Component Value Date/Time   NA 136 01/07/2012 0503   K 3.6 01/07/2012 0503   CL 98 01/07/2012 0503   CO2 30 01/07/2012 0503   GLUCOSE 133* 01/07/2012 0503   BUN 21 01/07/2012 0503   CREATININE 1.87* 01/07/2012 0503   CALCIUM 8.4 01/07/2012 0503   PROT 6.7 01/07/2012 0503   ALBUMIN 2.3* 01/07/2012 0503   AST 80* 01/07/2012 0503   ALT 28  01/07/2012 0503   ALKPHOS 57 01/07/2012 0503   BILITOT 0.8 01/07/2012 0503   GFRNONAA 42* 01/07/2012 0503   GFRAA 49* 01/07/2012 0503     Intake/Output Summary (Last 24 hours) at 01/07/12 1131 Last data filed at 01/07/12 0800  Gross per 24 hour  Intake   2183 ml  Output   2400 ml  Net   -217 ml    Diet Order: NPO  Supplements/Tube Feeding:none at this time  IVF:    sodium chloride Last Rate: 30 mL/hr at 01/06/12 0800  TPN (CLINIMIX) +/- additives Last Rate: 60 mL/hr at 01/06/12 1000  TPN (CLINIMIX) +/- additives Last Rate: 60 mL/hr at 01/07/12 7829  TPN (CLINIMIX) +/- additives     Estimated Nutritional Needs:   Kcal:2000-2200 kcal per day Protein:113-128  grams per day Fluid:2-2.2 liters per day   NUTRITION DIAGNOSIS: -Inadequate oral intake (NI-2.1).  Status: Ongoing  RELATED TO: abdominal pain, N/V  AS EVIDENCE BY: acute pancreatitis, NPO status  MONITORING/EVALUATION(Goals): Monitor nutritional intake: TPN and advancement of diet, labs, I/O's, weight changes GOC: Meet >75% est nutritional needs and successful transition to tol of oral intake.  EDUCATION NEEDS: -Education not appropriate at this time  INTERVENTION: -RD to monitor  Dietitian #  [id number]  DOCUMENTATION CODES Per approved criteria  -Obesity Unspecified    Francene Boyers 01/07/2012, 11:25 AM

## 2012-01-07 NOTE — Progress Notes (Signed)
Tmax 102.5 at 0600. VANC & DIF ADDED/ CX PENDING. ILEUS/ABD PAIN PERSIST. MONITOR FOR S/SX OF INFCTED PANCREATIC TISSUE. AWAIT Cx RESULTS.

## 2012-01-07 NOTE — Progress Notes (Signed)
Subjective: Pt c/o severe 9/10 intermittent generalized abd pain.  No nausea or vomiting.  No rectal bleeding or melena.  On clear liquids.  Objective: Vital signs in last 24 hours: Temp:  [98.5 F (36.9 C)-102.5 F (39.2 C)] 100.7 F (38.2 C) (04/01 1119) Pulse Rate:  [111-130] 116  (04/01 0800) Resp:  [13-29] 19  (04/01 0700) BP: (113-142)/(66-95) 132/87 mmHg (04/01 0900) SpO2:  [89 %-99 %] 95 % (04/01 1515) Weight:  [260 lb 9.3 oz (118.2 kg)] 260 lb 9.3 oz (118.2 kg) (04/01 0500) Last BM Date: 01/05/12 General:   Alert,  Well-developed, well-nourished, pleasant and cooperative.  Appears uncomfortable. Eyes:  Sclera clear, no icterus.   Conjunctiva pink. Mouth:  No deformity or lesions, oropharynx pink & moist. Heart: +tachycardia 120. Abdomen:  Decreased BS.  Significantly distended.  Firm.  + tender all over.  +rebound & guarding.   Extremities:  2+ pitting edema bilat feet/ankles Neurologic:  Alert and  oriented x4;  grossly normal neurologically.  Intake/Output from previous day: 03/31 0701 - 04/01 0700 In: 2842 [P.O.:335; I.V.:355; IV Piggyback:730; TPN:1422] Out: 3750 [Urine:3750]  Lab Results:  Basename 01/07/12 1006 01/07/12 0503 01/06/12 2214  WBC 12.7* 13.2* 12.0*  HGB 9.0* 8.7* 9.0*  HCT 27.1* 26.1* 26.5*  PLT 239 255 230   BMET  Basename 01/07/12 0503 01/06/12 0850 01/05/12 0516  NA 136 139 135  K 3.6 3.7 3.8  CL 98 100 101  CO2 30 29 26   GLUCOSE 133* 138* 188*  BUN 21 22 23   CREATININE 1.87* 1.96* 1.97*  CALCIUM 8.4 8.1* 7.4*   LFT  Basename 01/07/12 0503 01/06/12 0849 01/05/12 0516  PROT 6.7 -- 6.3  ALBUMIN 2.3* -- 2.4*  AST 80* -- 80*  ALT 28 -- 25  ALKPHOS 57 -- 58  BILITOT 0.8 -- 0.9  BILIDIR -- -- --  IBILI -- -- --  LIPASE 108* 107* 107*  AMYLASE -- -- --   Studies/Results: Dg Abd Portable 1v  01/07/2012  *RADIOLOGY REPORT*  Clinical Data: Abdominal pain  PORTABLE ABDOMEN - 1 VIEW  Comparison:  01/04/2012  Findings: Persistent  moderate small bowel gaseous distention probable due to ileus or partial bowel obstruction.  Moderate gaseous distention of the transverse colon.  Contrast material from recent CT scan noted in distal colon.  IMPRESSION: Persistent moderate small bowel gaseous distention probable due to ileus or partial bowel obstruction.  Moderate gaseous distention of the transverse colon.  Contrast material from recent CT scan noted in distal colon.  Original Report Authenticated By: Natasha Mead, M.D.    Assessment: 1. Acute pancreatitis secondary to etoh +/- hypertriglyceridemia w/ SIRS:  Condition guarded.  Persistent severe abdominal pain. 2. SB Ileus/pSBO: Persistent 3. Retroperitoneal bleed:  Hgb stable. 4. Alcohol abuse  5. Hepatic steatosis:   LFTs stable 6. Hypertriglyceridemia 7. Rhabdomyolysis 8. Sepsis: On Vancomycin & primaxing  Plan: 1. Continue antibiotics 2. NPO 3. Continue PPI  4. Will discuss further w/ Dr Darrick Penna 5. Continue supportive measures including pain control 6. Bedrest  LOS: 9 days   Lorenza Burton  01/07/2012, 3:19 PM   Addendum:  Discussed w/ Dr Darrick Penna.  Advised continue current plan of care.

## 2012-01-07 NOTE — Consult Note (Signed)
PARENTERAL NUTRITION CONSULT NOTE   Pharmacy Consult for TPN Indication: severe pancreatitis  Allergies  Allergen Reactions  . Ace Inhibitors Swelling    Lip swelling   Patient Measurements: Height: 5\' 10"  (177.8 cm) Weight: 260 lb 9.3 oz (118.2 kg) IBW/kg (Calculated) : 73  Adjusted Body Weight: 87KG  Vital Signs: Temp: 99 F (37.2 C) (04/01 0747) Temp src: Oral (04/01 0747) BP: 132/87 mmHg (04/01 0900) Pulse Rate: 116  (04/01 0800) Intake/Output from previous day: 03/31 0701 - 04/01 0700 In: 2842 [P.O.:335; I.V.:355; IV Piggyback:730; TPN:1422] Out: 3750 [Urine:3750] Intake/Output from this shift:    Labs:  Basename 01/07/12 1006 01/07/12 0503 01/06/12 2214  WBC 12.7* 13.2* 12.0*  HGB 9.0* 8.7* 9.0*  HCT 27.1* 26.1* 26.5*  PLT 239 255 230  APTT -- -- --  INR -- -- --    Basename 01/07/12 0504 01/07/12 0503 01/06/12 0850 01/05/12 0516  NA -- 136 139 135  K -- 3.6 3.7 3.8  CL -- 98 100 101  CO2 -- 30 29 26   GLUCOSE -- 133* 138* 188*  BUN -- 21 22 23   CREATININE -- 1.87* 1.96* 1.97*  LABCREA -- -- -- --  CREAT24HRUR -- -- -- --  CALCIUM -- 8.4 8.1* 7.4*  MG -- 2.3 -- 2.4  PHOS -- 2.2* -- 2.2*  PROT -- 6.7 -- 6.3  ALBUMIN -- 2.3* -- 2.4*  AST -- 80* -- 80*  ALT -- 28 -- 25  ALKPHOS -- 57 -- 58  BILITOT -- 0.8 -- 0.9  BILIDIR -- -- -- --  IBILI -- -- -- --  PREALBUMIN -- -- -- 6.0*  TRIG 203* -- -- 288*  CHOLHDL -- -- -- --  CHOL 149 -- -- 185   Estimated Creatinine Clearance: 65.6 ml/min (by C-G formula based on Cr of 1.87).    Basename 01/07/12 0729 01/07/12 0557 01/07/12 0006  GLUCAP 134* 136* 125*   Medical History: Past Medical History  Diagnosis Date  . Hypertension   . Ileus 01/03/2012  . Retroperitoneal bleed 01/04/2012   Medications:  Scheduled:     . cloNIDine  0.1 mg Oral BID  . fluconazole (DIFLUCAN) IV  200 mg Intravenous Q24H  . furosemide  60 mg Intravenous BID  . HYDROmorphone      . imipenem-cilastatin  500 mg  Intravenous Q8H  . insulin aspart  0-20 Units Subcutaneous Q4H  . levalbuterol  0.63 mg Nebulization Q6H  . pantoprazole (PROTONIX) IV  40 mg Intravenous BID  . polyethylene glycol  17 g Oral Daily  . senna-docusate  2 tablet Oral QHS  . sodium chloride  10-40 mL Intracatheter Q12H  . sodium chloride      . thiamine  100 mg Oral Daily  . vancomycin  1,250 mg Intravenous Q12H  . DISCONTD: vancomycin  1,000 mg Intravenous Q12H   Insulin Requirements in the past 24 hours:  SSI + insulin in TPN (~ 12 units given)  Current Nutrition:  TPN at 54ml/hr  Assessment: Pt reportedly requesting food Obesity Severe pancreatitis Hypoalbuminemia Hyponatremia resolved Hypocalcemia resolved  Hyperglycemia due to TPN, improving, adjusting insulin SCr improving  Nutritional Goals:  2000-2100 kCal, 100-120 grams of protein per day  Plan: Advance TPN to 37ml/hr today (Clinimix E 5/15) NO LIPID (FAT) INFUSION per Dr Darrick Penna (d/w MD) increase regular insulin 25 units/Liter (50 units per bag) Continue IVF (saline), rate per MD MVI and Trace Elements every Mon-Wed-Fri (shortage) F/U Lytes, fluid status, renal fxn, glucose tolerance  tomorrow  Margo Aye, Rekia Kujala A 01/07/2012,10:28 AM

## 2012-01-07 NOTE — Progress Notes (Signed)
Temp of 103 Blood cultures drawn Md notified

## 2012-01-07 NOTE — Progress Notes (Signed)
Notified MD of temp of 102.5.  Tylenol given.

## 2012-01-07 NOTE — Progress Notes (Signed)
Patient ambulated tonight approx. 87ft. From bed to exit door in hallway and back to bed. Tolerated fairly well.   SOB and pain to abdomen. HR 130 during ambulation. At rest has remained 115-122.  Oxygen 92% on 3LNC during ambulation.

## 2012-01-08 LAB — GLUCOSE, CAPILLARY
Glucose-Capillary: 103 mg/dL — ABNORMAL HIGH (ref 70–99)
Glucose-Capillary: 119 mg/dL — ABNORMAL HIGH (ref 70–99)
Glucose-Capillary: 126 mg/dL — ABNORMAL HIGH (ref 70–99)
Glucose-Capillary: 126 mg/dL — ABNORMAL HIGH (ref 70–99)

## 2012-01-08 LAB — CULTURE, BLOOD (ROUTINE X 2)
Culture: NO GROWTH
Culture: NO GROWTH

## 2012-01-08 LAB — CBC
Hemoglobin: 8.7 g/dL — ABNORMAL LOW (ref 13.0–17.0)
MCHC: 33.7 g/dL (ref 30.0–36.0)
Platelets: 261 10*3/uL (ref 150–400)
Platelets: 271 10*3/uL (ref 150–400)
RBC: 3.18 MIL/uL — ABNORMAL LOW (ref 4.22–5.81)
RBC: 2.99 MIL/uL — ABNORMAL LOW (ref 4.22–5.81)
RDW: 14.7 % (ref 11.5–15.5)
WBC: 10.5 10*3/uL (ref 4.0–10.5)

## 2012-01-08 LAB — BASIC METABOLIC PANEL
Calcium: 8.4 mg/dL (ref 8.4–10.5)
Creatinine, Ser: 1.93 mg/dL — ABNORMAL HIGH (ref 0.50–1.35)
GFR calc Af Amer: 47 mL/min — ABNORMAL LOW (ref >90)

## 2012-01-08 LAB — PROCALCITONIN: Procalcitonin: 1.36 ng/mL

## 2012-01-08 LAB — CK: Total CK: 1244 U/L — ABNORMAL HIGH (ref 7–232)

## 2012-01-08 MED ORDER — INSULIN REGULAR HUMAN 100 UNIT/ML IJ SOLN
INTRAMUSCULAR | Status: AC
  Administered 2012-01-08: 19:00:00 via INTRAVENOUS
  Filled 2012-01-08: qty 2000

## 2012-01-08 NOTE — Consult Note (Signed)
PARENTERAL NUTRITION CONSULT NOTE   Pharmacy Consult for TPN Indication: severe pancreatitis  Allergies  Allergen Reactions  . Ace Inhibitors Swelling    Lip swelling   Patient Measurements: Height: 5\' 10"  (177.8 cm) Weight: 259 lb 0.7 oz (117.5 kg) IBW/kg (Calculated) : 73  Adjusted Body Weight: 87KG  Vital Signs: Temp: 101.2 F (38.4 C) (04/02 0400) Temp src: Oral (04/02 0400) BP: 147/95 mmHg (04/02 0600) Pulse Rate: 116  (04/02 0600) Intake/Output from previous day: 04/01 0701 - 04/02 0700 In: 2900 [P.O.:300; I.V.:1200; IV Piggyback:300; TPN:1100] Out: 2501 [Urine:2500; Stool:1] Intake/Output from this shift:    Labs:  [facility] 01/08/12 0420 01/07/12 2009 01/07/12 1634  WBC 10.5 11.0* 12.1*  HGB 9.1* 9.3* 9.2*  HCT 27.3* 28.1* 27.6*  PLT 261 283 275  APTT -- -- --  INR -- -- --    Basename 01/07/12 0504 01/07/12 0503 01/06/12 0850  NA -- 136 139  K -- 3.6 3.7  CL -- 98 100  CO2 -- 30 29  GLUCOSE -- 133* 138*  BUN -- 21 22  CREATININE -- 1.87* 1.96*  LABCREA -- -- --  CREAT24HRUR -- -- --  CALCIUM -- 8.4 8.1*  MG -- 2.3 --  PHOS -- 2.2* --  PROT -- 6.7 --  ALBUMIN -- 2.3* --  AST -- 80* --  ALT -- 28 --  ALKPHOS -- 57 --  BILITOT -- 0.8 --  BILIDIR -- -- --  IBILI -- -- --  PREALBUMIN -- 5.4* --  TRIG 203* -- --  CHOLHDL -- -- --  CHOL 149 -- --   Estimated Creatinine Clearance: 65.4 ml/min (by C-G formula based on Cr of 1.87).    Basename 01/08/12 0800 01/08/12 0420 01/08/12 0005  GLUCAP 126* 130* 126*   Medical History: Past Medical History  Diagnosis Date  . Hypertension   . Ileus 01/03/2012  . Retroperitoneal bleed 01/04/2012   Medications:  Scheduled:     . cloNIDine  0.1 mg Oral BID  . fluconazole (DIFLUCAN) IV  200 mg Intravenous Q24H  . furosemide  60 mg Intravenous BID  . imipenem-cilastatin  500 mg Intravenous Q8H  . insulin aspart  0-20 Units Subcutaneous Q4H  . levalbuterol  0.63 mg Nebulization Q6H  . pantoprazole  (PROTONIX) IV  40 mg Intravenous BID  . polyethylene glycol  17 g Oral Daily  . senna-docusate  2 tablet Oral QHS  . sodium chloride  10-40 mL Intracatheter Q12H  . thiamine  100 mg Oral Daily  . vancomycin  1,250 mg Intravenous Q12H  . DISCONTD: vancomycin  1,000 mg Intravenous Q12H   Insulin Requirements in the past 24 hours:  SSI + insulin in TPN (~ 16 units given)  Current Nutrition:  TPN at 48ml/hr  Assessment: Pt reportedly requesting food Obesity Severe pancreatitis Hypoalbuminemia Hyponatremia resolved Hypocalcemia resolved Mild Hypophosphatemia. Hyperglycemia due to TPN, improving, adjusting insulin SCr improving  Nutritional Goals:  2000-2100 kCal, 100-120 grams of protein per day  Plan: Advance TPN to 37ml/hr today (Clinimix E 5/15) NO LIPID (FAT) INFUSION per Dr Darrick Penna (d/w MD) Continue regular insulin 25 units/Liter (50 units per bag) Continue IVF (saline), rate per MD MVI and Trace Elements every Mon-Wed-Fri (shortage) F/U Lytes, fluid status, renal fxn, glucose tolerance tomorrow  Mady Gemma 01/08/2012,8:52 AM

## 2012-01-08 NOTE — Progress Notes (Signed)
Chart reviewed.  Subjective: Had 2 large bowel movements today, brown in color. Still with abdominal pain and distention. Nausea but no vomiting. Dysuria present. Urinary frequency.  Objective: Vital signs in last 24 hours: Filed Vitals:   01/08/12 0749 01/08/12 0800 01/08/12 0900 01/08/12 1000  BP:  133/103  132/91  Pulse:  119 127 124  Temp:  100.7 F (38.2 C)    TempSrc:  Oral    Resp:  23    Height:      Weight:      SpO2: 95% 97% 93% 97%   Weight change: -0.7 kg (-1 lb 8.7 oz)  Intake/Output Summary (Last 24 hours) at 01/08/12 1101 Last data filed at 01/08/12 1000  Gross per 24 hour  Intake 3959.99 ml  Output   3401 ml  Net 558.99 ml   Physical Exam: General: Alert, more oriented and appropriate in last week when I saw him last. Fairly comfortable. Lungs clear to auscultation bilaterally without wheeze rhonchi or rales Cardiovascular regular rate rhythm without murmurs gallops rubs Abdomen: Much more distended than last week when I saw him. Diminished bowel sounds. Diffusely tender. Extremities: 1+ to 2+ pitting edema  Lab Results: Basic Metabolic Panel:  Lab 01/07/12 1610 01/06/12 0850 01/05/12 0516  NA 136 139 --  K 3.6 3.7 --  CL 98 100 --  CO2 30 29 --  GLUCOSE 133* 138* --  BUN 21 22 --  CREATININE 1.87* 1.96* --  CALCIUM 8.4 8.1* --  MG 2.3 -- 2.4  PHOS 2.2* -- 2.2*   Liver Function Tests:  Lab 01/07/12 0503 01/05/12 0516  AST 80* 80*  ALT 28 25  ALKPHOS 57 58  BILITOT 0.8 0.9  PROT 6.7 6.3  ALBUMIN 2.3* 2.4*    Lab 01/08/12 0420 01/07/12 0503  LIPASE 108* 108*  AMYLASE -- --   No results found for this basename: AMMONIA:2 in the last 168 hours CBC:  Lab 01/08/12 1030 01/08/12 0420 01/07/12 0503 01/05/12 0516  WBC 11.2* 10.5 -- --  NEUTROABS -- -- 11.7* 8.6*  HGB 8.7* 9.1* -- --  HCT 25.8* 27.3* -- --  MCV 86.3 85.8 -- --  PLT 271 261 -- --   Cardiac Enzymes:  Lab 01/08/12 0420 01/07/12 0503 01/06/12 0849  CKTOTAL 1244* 1224*  1537*  CKMB -- -- --  CKMBINDEX -- -- --  TROPONINI -- -- --   BNP: No results found for this basename: PROBNP:3 in the last 168 hours D-Dimer: No results found for this basename: DDIMER:2 in the last 168 hours CBG:  Lab 01/08/12 0800 01/08/12 0420 01/08/12 0005 01/07/12 2010 01/07/12 1614 01/07/12 1106  GLUCAP 126* 130* 126* 136* 115* 134*   Hemoglobin A1C: No results found for this basename: HGBA1C in the last 168 hours Fasting Lipid Panel:  Lab 01/07/12 0504  CHOL 149  HDL --  LDLCALC --  TRIG 203*  CHOLHDL --  LDLDIRECT --   Thyroid Function Tests: No results found for this basename: TSH,T4TOTAL,FREET4,T3FREE,THYROIDAB in the last 168 hours Coagulation: No results found for this basename: LABPROT:4,INR:4 in the last 168 hours Anemia Panel:  Lab 01/03/12 1456  VITAMINB12 1188*  FOLATE 15.3  FERRITIN 2243*  TIBC 165*  IRON 25*  RETICCTPCT 2.3   Urine Drug Screen: Drugs of Abuse     Component Value Date/Time   LABOPIA POSITIVE* 12/30/2011 1433   COCAINSCRNUR NONE DETECTED 12/30/2011 1433   LABBENZ NONE DETECTED 12/30/2011 1433   AMPHETMU NONE DETECTED 12/30/2011 1433  THCU NONE DETECTED 12/30/2011 1433   LABBARB NONE DETECTED 12/30/2011 1433    Alcohol Level: No results found for this basename: ETH:2 in the last 168 hours Urinalysis: No results found for this basename: COLORURINE:2,APPERANCEUR:2,LABSPEC:2,PHURINE:2,GLUCOSEU:2,HGBUR:2,BILIRUBINUR:2,KETONESUR:2,PROTEINUR:2,UROBILINOGEN:2,NITRITE:2,LEUKOCYTESUR:2 in the last 168 hours  Micro Results: Recent Results (from the past 240 hour(s))  CULTURE, BLOOD (ROUTINE X 2)     Status: Normal   Collection Time   12/30/11  1:45 PM      Component Value Range Status Comment   Specimen Description BLOOD LEFT HAND   Final    Special Requests BOTTLES DRAWN AEROBIC ONLY 4CC ONE BOTTLE   Final    Culture NO GROWTH 9 DAYS   Final    Report Status 01/08/2012 FINAL   Final   MRSA PCR SCREENING     Status: Normal    Collection Time   12/30/11  1:49 PM      Component Value Range Status Comment   MRSA by PCR NEGATIVE  NEGATIVE  Final   CULTURE, BLOOD (ROUTINE X 2)     Status: Normal   Collection Time   12/30/11  2:00 PM      Component Value Range Status Comment   Specimen Description BLOOD LEFT ARM   Final    Special Requests     Final    Value: BOTTLES DRAWN AEROBIC AND ANAEROBIC 4CC EACH BOTTLE   Culture NO GROWTH 9 DAYS   Final    Report Status 01/08/2012 FINAL   Final   URINE CULTURE     Status: Normal   Collection Time   12/30/11  3:10 PM      Component Value Range Status Comment   Specimen Description URINE, CATHETERIZED   Final    Special Requests NONE   Final    Culture  Setup Time [id number]   Final    Colony Count NO GROWTH   Final    Culture NO GROWTH   Final    Report Status 12/31/2011 FINAL   Final   CULTURE, BLOOD (ROUTINE X 2)     Status: Normal (Preliminary result)   Collection Time   01/05/12  8:04 AM      Component Value Range Status Comment   Specimen Description Blood PICC LINE   Final    Special Requests     Final    Value: BOTTLES DRAWN AEROBIC AND ANAEROBIC 6CC DRAWN BY RN   Culture NO GROWTH 3 DAYS   Final    Report Status PENDING   Incomplete   CULTURE, BLOOD (ROUTINE X 2)     Status: Normal (Preliminary result)   Collection Time   01/05/12  8:12 AM      Component Value Range Status Comment   Specimen Description Blood LEFT ANTECUBITAL   Final    Special Requests BOTTLES DRAWN AEROBIC AND ANAEROBIC 7CC   Final    Culture NO GROWTH 3 DAYS   Final    Report Status PENDING   Incomplete   CULTURE, BLOOD (ROUTINE X 2)     Status: Normal (Preliminary result)   Collection Time   01/07/12  8:25 PM      Component Value Range Status Comment   Specimen Description LEFT ANTECUBITAL   Final    Special Requests BOTTLES DRAWN AEROBIC AND ANAEROBIC 6CC   Final    Culture NO GROWTH 1 DAY   Final    Report Status PENDING   Incomplete   CULTURE, BLOOD (ROUTINE X 2)  Status:  Normal (Preliminary result)   Collection Time   01/07/12  8:26 PM      Component Value Range Status Comment   Specimen Description LEFT ANTECUBITAL   Final    Special Requests BOTTLES DRAWN AEROBIC AND ANAEROBIC 6CC   Final    Culture NO GROWTH 1 DAY   Final    Report Status PENDING   Incomplete    Studies/Results: Dg Abd Portable 1v  01/07/2012  *RADIOLOGY REPORT*  Clinical Data: Abdominal pain  PORTABLE ABDOMEN - 1 VIEW  Comparison:  01/04/2012  Findings: Persistent moderate small bowel gaseous distention probable due to ileus or partial bowel obstruction.  Moderate gaseous distention of the transverse colon.  Contrast material from recent CT scan noted in distal colon.  IMPRESSION: Persistent moderate small bowel gaseous distention probable due to ileus or partial bowel obstruction.  Moderate gaseous distention of the transverse colon.  Contrast material from recent CT scan noted in distal colon.  Original Report Authenticated By: Natasha Mead, M.D.   Scheduled Meds:   . cloNIDine  0.1 mg Oral BID  . fluconazole (DIFLUCAN) IV  200 mg Intravenous Q24H  . furosemide  60 mg Intravenous BID  . imipenem-cilastatin  500 mg Intravenous Q8H  . insulin aspart  0-20 Units Subcutaneous Q4H  . levalbuterol  0.63 mg Nebulization Q6H  . pantoprazole (PROTONIX) IV  40 mg Intravenous BID  . polyethylene glycol  17 g Oral Daily  . senna-docusate  2 tablet Oral QHS  . sodium chloride  10-40 mL Intracatheter Q12H  . thiamine  100 mg Oral Daily  . vancomycin  1,250 mg Intravenous Q12H   Continuous Infusions:   . sodium chloride 1,000 mL (01/08/12 0737)  . TPN (CLINIMIX) +/- additives 60 mL/hr at 01/07/12 0642  . TPN (CLINIMIX) +/- additives    . TPN (CLINIMIX) +/- additives 70 mL/hr at 01/08/12 0600   PRN Meds:.acetaminophen, albuterol, bisacodyl, HYDROmorphone, LORazepam, ondansetron (ZOFRAN) IV, phenol, sodium chloride Assessment/Plan:   *Acute pancreatitis, severe.   Alcohol withdrawal,  improved   ARF (acute renal failure), improved   Sepsis syndrome with negative cultures, but did have many bacteria on urinalysis last week. Now febrile again. We'll repeat UA C&S. Blood cultures negative to date. Will repeat pro calcitonin and lactate acid level. Continue broad-spectrum antibiotics and Diflucan for now.   Alcohol abuse   Metabolic acidosis   Hypertriglyceridemia   Rhabdomyolysis, improving   UTI (urinary tract infection), see above   HTN (hypertension)   Hepatic steatosis   Hypocalcemia improved   Ileus, improving. Had 2 bowel movements today.   Anemia due to dilution, critical illness, pancreatitis, cannot rule out retroperitoneal bleed. No evidence of GI bleed.   Hypoalbuminemia   Peripheral edema   Possible Retroperitoneal bleed, has been off subcutaneous heparin for several days.   Hyperglycemia  Discussed with Dr. Dian Situ. Patient has severe pancreatitis. Last noncontrasted CT of the abdomen and pelvis showed no abscess or necrosis, but patient may ultimately require transfer to a tertiary center if fever continues, or clinical deterioration occurs. Continue TPN.   LOS: 10 days   Rito Lecomte L 01/08/2012, 11:01 AM

## 2012-01-08 NOTE — Consult Note (Signed)
Reason for Consult:Acute pancreatitis Referring Physician: Dr. Darrick Penna.  Peter [name] is an 41 y.o. male.  HPI: Patient initially presented to [facility] with Epigastric pain.  Work-up demonstrated acute pancreatitis.  Patient has not had significant improvement but denies any worsening pain.  Patient denies any problem with urination.  Past Medical History  Diagnosis Date  . Hypertension   . Ileus 01/03/2012  . Retroperitoneal bleed 01/04/2012    Past Surgical History  Procedure Date  . Hernia repair     History reviewed. No pertinent family history.  Social History:  reports that he has never smoked. He does not have any smokeless tobacco history on file. He reports that he drinks about 4.2 ounces of alcohol per week. He reports that he does not use illicit drugs.  Allergies:  Allergies  Allergen Reactions  . Ace Inhibitors Swelling    Lip swelling    Medications:  I have reviewed the patient's current medications. Prior to Admission:  No prescriptions prior to admission   Scheduled:   . fluconazole (DIFLUCAN) IV  200 mg Intravenous Q24H  . furosemide  60 mg Intravenous BID  . imipenem-cilastatin  500 mg Intravenous Q8H  . insulin aspart  0-20 Units Subcutaneous Q4H  . levalbuterol  0.63 mg Nebulization Q6H  . pantoprazole (PROTONIX) IV  40 mg Intravenous BID  . polyethylene glycol  17 g Oral Daily  . senna-docusate  2 tablet Oral QHS  . sodium chloride  10-40 mL Intracatheter Q12H  . thiamine  100 mg Oral Daily  . vancomycin  1,250 mg Intravenous Q12H  . DISCONTD: cloNIDine  0.1 mg Oral BID   Continuous:   . sodium chloride 1,000 mL (01/08/12 0737)  . TPN (CLINIMIX) +/- additives 80 mL/hr at 01/08/12 1830  . TPN (CLINIMIX) +/- additives 70 mL/hr at 01/08/12 0600   UJW:[id number], albuterol, bisacodyl, HYDROmorphone, LORazepam, ondansetron (ZOFRAN) IV, phenol, sodium chloride  Results for orders placed during the hospital encounter of 12/29/11 (from the past 48  hour(s))  GLUCOSE, CAPILLARY     Status: Abnormal   Collection Time   01/07/12 12:06 AM      Component Value Range Comment   Glucose-Capillary 125 (*) 70 - 99 (mg/dL)    Comment 1 Documented in Chart     COMPREHENSIVE METABOLIC PANEL     Status: Abnormal   Collection Time   01/07/12  5:03 AM      Component Value Range Comment   Sodium 136  135 - 145 (mEq/L)    Potassium 3.6  3.5 - 5.1 (mEq/L)    Chloride 98  96 - 112 (mEq/L)    CO2 30  19 - 32 (mEq/L)    Glucose, Bld 133 (*) 70 - 99 (mg/dL)    BUN 21  6 - 23 (mg/dL)    Creatinine, Ser 3.08 (*) 0.50 - 1.35 (mg/dL)    Calcium 8.4  8.4 - 10.5 (mg/dL)    Total Protein 6.7  6.0 - 8.3 (g/dL)    Albumin 2.3 (*) 3.5 - 5.2 (g/dL)    AST 80 (*) 0 - 37 (U/L)    ALT 28  0 - 53 (U/L)    Alkaline Phosphatase 57  39 - 117 (U/L)    Total Bilirubin 0.8  0.3 - 1.2 (mg/dL)    GFR calc non Af Amer 42 (*) >90 (mL/min)    GFR calc Af Amer 49 (*) >90 (mL/min)   MAGNESIUM     Status: Normal   Collection  Time   01/07/12  5:03 AM      Component Value Range Comment   Magnesium 2.3  1.5 - 2.5 (mg/dL)   PHOSPHORUS     Status: Abnormal   Collection Time   01/07/12  5:03 AM      Component Value Range Comment   Phosphorus 2.2 (*) 2.3 - 4.6 (mg/dL)   CBC     Status: Abnormal   Collection Time   01/07/12  5:03 AM      Component Value Range Comment   WBC 13.2 (*) 4.0 - 10.5 (K/uL)    RBC 3.05 (*) 4.22 - 5.81 (MIL/uL)    Hemoglobin 8.7 (*) 13.0 - 17.0 (g/dL)    HCT 40.9 (*) 81.1 - 52.0 (%)    MCV 85.6  78.0 - 100.0 (fL)    MCH 28.5  26.0 - 34.0 (pg)    MCHC 33.3  30.0 - 36.0 (g/dL)    RDW 91.4  78.2 - 95.6 (%)    Platelets 255  150 - 400 (K/uL)   DIFFERENTIAL     Status: Abnormal   Collection Time   01/07/12  5:03 AM      Component Value Range Comment   Neutrophils Relative 89 (*) 43 - 77 (%)    Neutro Abs 11.7 (*) 1.7 - 7.7 (K/uL)    Lymphocytes Relative 6 (*) 12 - 46 (%)    Lymphs Abs 0.8  0.7 - 4.0 (K/uL)    Monocytes Relative 4  3 - 12 (%)     Monocytes Absolute 0.5  0.1 - 1.0 (K/uL)    Eosinophils Relative 1  0 - 5 (%)    Eosinophils Absolute 0.1  0.0 - 0.7 (K/uL)    Basophils Relative 0  0 - 1 (%)    Basophils Absolute 0.0  0.0 - 0.1 (K/uL)   PREALBUMIN     Status: Abnormal   Collection Time   01/07/12  5:03 AM      Component Value Range Comment   Prealbumin 5.4 (*) 17.0 - 34.0 (mg/dL)   CK     Status: Abnormal   Collection Time   01/07/12  5:03 AM      Component Value Range Comment   Total CK 1224 (*) 7 - 232 (U/L)   LIPASE, BLOOD     Status: Abnormal   Collection Time   01/07/12  5:03 AM      Component Value Range Comment   Lipase 108 (*) 11 - 59 (U/L)   CHOLESTEROL, TOTAL     Status: Normal   Collection Time   01/07/12  5:04 AM      Component Value Range Comment   Cholesterol 149  0 - 200 (mg/dL)   TRIGLYCERIDES     Status: Abnormal   Collection Time   01/07/12  5:04 AM      Component Value Range Comment   Triglycerides 203 (*) <150 (mg/dL)   GLUCOSE, CAPILLARY     Status: Abnormal   Collection Time   01/07/12  5:57 AM      Component Value Range Comment   Glucose-Capillary 136 (*) 70 - 99 (mg/dL)   GLUCOSE, CAPILLARY     Status: Abnormal   Collection Time   01/07/12  7:29 AM      Component Value Range Comment   Glucose-Capillary 134 (*) 70 - 99 (mg/dL)   VANCOMYCIN, TROUGH     Status: Abnormal   Collection Time   01/07/12  7:34 AM  Component Value Range Comment   Vancomycin Tr 8.8 (*) 10.0 - 20.0 (ug/mL)   CBC     Status: Abnormal   Collection Time   01/07/12 10:06 AM      Component Value Range Comment   WBC 12.7 (*) 4.0 - 10.5 (K/uL)    RBC 3.15 (*) 4.22 - 5.81 (MIL/uL)    Hemoglobin 9.0 (*) 13.0 - 17.0 (g/dL)    HCT 16.1 (*) 09.6 - 52.0 (%)    MCV 86.0  78.0 - 100.0 (fL)    MCH 28.6  26.0 - 34.0 (pg)    MCHC 33.2  30.0 - 36.0 (g/dL)    RDW 04.5  40.9 - 81.1 (%)    Platelets 239  150 - 400 (K/uL)   GLUCOSE, CAPILLARY     Status: Abnormal   Collection Time   01/07/12 11:06 AM      Component Value Range  Comment   Glucose-Capillary 134 (*) 70 - 99 (mg/dL)   GLUCOSE, CAPILLARY     Status: Abnormal   Collection Time   01/07/12  4:14 PM      Component Value Range Comment   Glucose-Capillary 115 (*) 70 - 99 (mg/dL)   CBC     Status: Abnormal   Collection Time   01/07/12  4:34 PM      Component Value Range Comment   WBC 12.1 (*) 4.0 - 10.5 (K/uL)    RBC 3.21 (*) 4.22 - 5.81 (MIL/uL)    Hemoglobin 9.2 (*) 13.0 - 17.0 (g/dL)    HCT 91.4 (*) 78.2 - 52.0 (%)    MCV 86.0  78.0 - 100.0 (fL)    MCH 28.7  26.0 - 34.0 (pg)    MCHC 33.3  30.0 - 36.0 (g/dL)    RDW 95.6  21.3 - 08.6 (%)    Platelets 275  150 - 400 (K/uL)   CBC     Status: Abnormal   Collection Time   01/07/12  8:09 PM      Component Value Range Comment   WBC 11.0 (*) 4.0 - 10.5 (K/uL)    RBC 3.27 (*) 4.22 - 5.81 (MIL/uL)    Hemoglobin 9.3 (*) 13.0 - 17.0 (g/dL)    HCT 57.8 (*) 46.9 - 52.0 (%)    MCV 85.9  78.0 - 100.0 (fL)    MCH 28.4  26.0 - 34.0 (pg)    MCHC 33.1  30.0 - 36.0 (g/dL)    RDW 62.9  52.8 - 41.3 (%)    Platelets 283  150 - 400 (K/uL)   GLUCOSE, CAPILLARY     Status: Abnormal   Collection Time   01/07/12  8:10 PM      Component Value Range Comment   Glucose-Capillary 136 (*) 70 - 99 (mg/dL)    Comment 1 Notify RN     CULTURE, BLOOD (ROUTINE X 2)     Status: Normal (Preliminary result)   Collection Time   01/07/12  8:25 PM      Component Value Range Comment   Specimen Description LEFT ANTECUBITAL      Special Requests BOTTLES DRAWN AEROBIC AND ANAEROBIC 6CC      Culture NO GROWTH 1 DAY      Report Status PENDING     CULTURE, BLOOD (ROUTINE X 2)     Status: Normal (Preliminary result)   Collection Time   01/07/12  8:26 PM      Component Value Range Comment   Specimen Description LEFT ANTECUBITAL  Special Requests BOTTLES DRAWN AEROBIC AND ANAEROBIC 6CC      Culture NO GROWTH 1 DAY      Report Status PENDING     GLUCOSE, CAPILLARY     Status: Abnormal   Collection Time   01/08/12 12:05 AM      Component Value  Range Comment   Glucose-Capillary 126 (*) 70 - 99 (mg/dL)    Comment 1 Notify RN     CBC     Status: Abnormal   Collection Time   01/08/12  4:20 AM      Component Value Range Comment   WBC 10.5  4.0 - 10.5 (K/uL)    RBC 3.18 (*) 4.22 - 5.81 (MIL/uL)    Hemoglobin 9.1 (*) 13.0 - 17.0 (g/dL)    HCT 40.9 (*) 81.1 - 52.0 (%)    MCV 85.8  78.0 - 100.0 (fL)    MCH 28.6  26.0 - 34.0 (pg)    MCHC 33.3  30.0 - 36.0 (g/dL)    RDW 91.4  78.2 - 95.6 (%)    Platelets 261  150 - 400 (K/uL)   CK     Status: Abnormal   Collection Time   01/08/12  4:20 AM      Component Value Range Comment   Total CK 1244 (*) 7 - 232 (U/L)   LIPASE, BLOOD     Status: Abnormal   Collection Time   01/08/12  4:20 AM      Component Value Range Comment   Lipase 108 (*) 11 - 59 (U/L)   GLUCOSE, CAPILLARY     Status: Abnormal   Collection Time   01/08/12  4:20 AM      Component Value Range Comment   Glucose-Capillary 130 (*) 70 - 99 (mg/dL)    Comment 1 Notify RN     BASIC METABOLIC PANEL     Status: Abnormal   Collection Time   01/08/12  4:20 AM      Component Value Range Comment   Sodium 134 (*) 135 - 145 (mEq/L)    Potassium 3.9  3.5 - 5.1 (mEq/L)    Chloride 96  96 - 112 (mEq/L)    CO2 29  19 - 32 (mEq/L)    Glucose, Bld 141 (*) 70 - 99 (mg/dL)    BUN 23  6 - 23 (mg/dL)    Creatinine, Ser 2.13 (*) 0.50 - 1.35 (mg/dL)    Calcium 8.4  8.4 - 10.5 (mg/dL)    GFR calc non Af Amer 41 (*) >90 (mL/min)    GFR calc Af Amer 47 (*) >90 (mL/min)   PROCALCITONIN     Status: Normal   Collection Time   01/08/12  4:20 AM      Component Value Range Comment   Procalcitonin 1.36     GLUCOSE, CAPILLARY     Status: Abnormal   Collection Time   01/08/12  8:00 AM      Component Value Range Comment   Glucose-Capillary 126 (*) 70 - 99 (mg/dL)   CBC     Status: Abnormal   Collection Time   01/08/12 10:30 AM      Component Value Range Comment   WBC 11.2 (*) 4.0 - 10.5 (K/uL)    RBC 2.99 (*) 4.22 - 5.81 (MIL/uL)    Hemoglobin 8.7 (*)  13.0 - 17.0 (g/dL)    HCT 08.6 (*) 57.8 - 52.0 (%)    MCV 86.3  78.0 - 100.0 (fL)  MCH 29.1  26.0 - 34.0 (pg)    MCHC 33.7  30.0 - 36.0 (g/dL)    RDW 16.1  09.6 - 04.5 (%)    Platelets 271  150 - 400 (K/uL)   GLUCOSE, CAPILLARY     Status: Abnormal   Collection Time   01/08/12 11:36 AM      Component Value Range Comment   Glucose-Capillary 127 (*) 70 - 99 (mg/dL)    Comment 1 Documented in Chart      Comment 2 Notify RN     GLUCOSE, CAPILLARY     Status: Abnormal   Collection Time   01/08/12  4:13 PM      Component Value Range Comment   Glucose-Capillary 103 (*) 70 - 99 (mg/dL)    Comment 1 Notify RN      Comment 2 Documented in Chart     CLOSTRIDIUM DIFFICILE BY PCR     Status: Normal   Collection Time   01/08/12  7:02 PM      Component Value Range Comment   C difficile by pcr NEGATIVE  NEGATIVE    GLUCOSE, CAPILLARY     Status: Abnormal   Collection Time   01/08/12  8:00 PM      Component Value Range Comment   Glucose-Capillary 119 (*) 70 - 99 (mg/dL)    Comment 1 Documented in Chart      Comment 2 Notify RN       Dg Abd Portable 1v  01/07/2012  *RADIOLOGY REPORT*  Clinical Data: Abdominal pain  PORTABLE ABDOMEN - 1 VIEW  Comparison:  01/04/2012  Findings: Persistent moderate small bowel gaseous distention probable due to ileus or partial bowel obstruction.  Moderate gaseous distention of the transverse colon.  Contrast material from recent CT scan noted in distal colon.  IMPRESSION: Persistent moderate small bowel gaseous distention probable due to ileus or partial bowel obstruction.  Moderate gaseous distention of the transverse colon.  Contrast material from recent CT scan noted in distal colon.  Original Report Authenticated By: Natasha Mead, M.D.    Review of Systems  Constitutional: Positive for chills. Negative for fever.  HENT: Negative.   Eyes: Negative.   Respiratory: Negative.   Cardiovascular: Negative.   Gastrointestinal: Positive for heartburn, nausea and abdominal  pain (Epigastric). Negative for vomiting, diarrhea, constipation, blood in stool and melena.  Genitourinary: Negative.   Musculoskeletal: Negative.   Skin: Negative.   Neurological: Negative.   Endo/Heme/Allergies: Negative.   Psychiatric/Behavioral: Negative.    Blood pressure 151/86, pulse 125, temperature 102.8 F (39.3 C), temperature source Oral, resp. rate 20, height 5\' 10"  (1.778 m), weight 117.5 kg (259 lb 0.7 oz), SpO2 97.00%. Physical Exam  Constitutional: He is oriented to person, place, and time. He appears well-developed.       Uncomfortable.  HENT:  Head: Normocephalic and atraumatic.  Eyes: Conjunctivae and EOM are normal. Pupils are equal, round, and reactive to light. No scleral icterus.  Neck: Normal range of motion. No tracheal deviation present. No thyromegaly present.  Cardiovascular: Regular rhythm.        tachycardic  Respiratory: Effort normal and breath sounds normal.  GI: Soft. He exhibits distension. He exhibits no mass. There is tenderness (Moderate to severe epigastric pain.  NO diffuse peritoneal signs.).  Lymphadenopathy:    He has no cervical adenopathy.  Neurological: He is alert and oriented to person, place, and time.  Skin: Skin is warm and dry.    Assessment/Plan: Acute pancreatitis.  Patient's condition is guarded but no evidence of acute surgical indications.  No evidence for necrosis or hemorrhage.  Continue to monitor WBC.  If continues to have temperatures consider re-evaluation with CT abd pelvis.  No current evidence of compartment syndrome.  Should patient require surgical intervention, transfer will be required due to limited resources for post-operative management at [facility]  Will follow but have little to add at this time.  Oryn Casanova C 01/08/2012, 11:32 PM

## 2012-01-08 NOTE — Progress Notes (Addendum)
PT CLINICALLY IMPROVED: BP STABLE, HB STABLE, HAVING BMs, TOLERATING POS, WBC 11.2, LIP 108, CR IMPROVED. CONTINUES WITH ABDOMINAL DISTENTION, BS PRESENT. AGREE WITH CDIFF PCR. CONTINUE TPN. AWAIT CULTURES. CLEAR LIQUID DIET. ENCOURAGED PT TO AMBULATE IN THE ROOM & USE HIS INCENTIVE SPIROMETER TO AVOID ATELECTASIS/PNA.

## 2012-01-08 NOTE — Progress Notes (Signed)
Subjective: C/o 8/10 abd pain.  2 loose BM.  Denies emesis.  +febrile Tmax 101.2.  Blood cx pending.  On primaxin, vancomycin & diflucan.  Hgb stable.  Creatinine improving. Objective: Vital signs in last 24 hours: Temp:  [99.4 F (37.4 C)-103.1 F (39.5 C)] 101.2 F (38.4 C) (04/02 0400) Pulse Rate:  [108-133] 116  (04/02 0600) Resp:  [12-31] 14  (04/02 0600) BP: (119-157)/(69-111) 147/95 mmHg (04/02 0600) SpO2:  [90 %-100 %] 95 % (04/02 0749) Weight:  [259 lb 0.7 oz (117.5 kg)] 259 lb 0.7 oz (117.5 kg) (04/02 0500) Last BM Date: 01/07/12 General:   Alert,  Well-developed, well-nourished, pleasant and cooperative.  Appears uncomfortable.  Girlfriend @ bedside. Eyes:  Sclera clear, no icterus.   Conjunctiva pink. Mouth:  No deformity or lesions, oropharynx pink & moist. Heart: +tachycardia 120's. Abdomen:  Faint BS.  Significantly distended.  Firm.  + tender all over.  +rebound & guarding.   Extremities:  2+ pitting edema bilat feet/ankles.  SCDs in place.  Neurologic:  Alert and  oriented x4;  grossly normal neurologically.  Intake/Output from previous day: 04/01 0701 - 04/02 0700 In: 2900 [P.O.:300; I.V.:1200; IV Piggyback:300; TPN:1100] Out: 2501 [Urine:2500; Stool:1]  Lab Results:  [facility] 01/08/12 0420 01/07/12 2009 01/07/12 1634  WBC 10.5 11.0* 12.1*  HGB 9.1* 9.3* 9.2*  HCT 27.3* 28.1* 27.6*  PLT 261 283 275   BMET  Basename 01/07/12 0503 01/06/12 0850  NA 136 139  K 3.6 3.7  CL 98 100  CO2 30 29  GLUCOSE 133* 138*  BUN 21 22  CREATININE 1.87* 1.96*  CALCIUM 8.4 8.1*   LFT  Basename 01/08/12 0420 01/07/12 0503 01/06/12 0849  PROT -- 6.7 --  ALBUMIN -- 2.3* --  AST -- 80* --  ALT -- 28 --  ALKPHOS -- 57 --  BILITOT -- 0.8 --  BILIDIR -- -- --  IBILI -- -- --  LIPASE 108* 108* 107*  AMYLASE -- -- --   Assessment: 1. Acute pancreatitis, secondary to etoh +/- hypertriglyceridemia w/ SIRS:  Condition guarded.  Abd pain & distention.  May have  necrotizing component given clinical course.  At risk for compartment syndrome. 2. SB Ileus/pSBO 3. Retroperitoneal bleed:  Hgb stable. 4. Alcohol abuse  5. Hepatic steatosis 6. Hypertriglyceridemia 7. Rhabdomyolysis 8. Sepsis: Blood Cx pending.  On Vancomycin, primaxin, diflucan  Plan: (Discussed w/ Dr Darrick Penna) 1. Check stool c diff 2. Continue supportive measures, pain control, TPN, IVFs 3. Continue primaxin, vancomycin, diflucan 4. Surgical consult [id number] pancreatitis, SB ileus, fever, RP bleed, ? Necrotizing pancreatitis +/- compartment syndrome    LOS: 10 days   Lorenza Burton  01/08/2012, 7:52 AM

## 2012-01-08 NOTE — Progress Notes (Signed)
Peter [name]  MRN: [id number]  DOB/AGE: 11/12/1970 41 y.o.  Primary Care Physician:GOLDING,Daric [name], MD, MD  Admit date: 12/29/2011  Chief Complaint:  Chief Complaint  Patient presents with  . Abdominal Pain  . Emesis  . Diarrhea    S-Pt presented on  12/29/2011 with  Chief Complaint  Patient presents with  . Abdominal Pain  . Emesis  . Diarrhea  .    Pt today feels worse.  Meds     . cloNIDine  0.1 mg Oral BID  . fluconazole (DIFLUCAN) IV  200 mg Intravenous Q24H  . furosemide  60 mg Intravenous BID  . imipenem-cilastatin  500 mg Intravenous Q8H  . insulin aspart  0-20 Units Subcutaneous Q4H  . levalbuterol  0.63 mg Nebulization Q6H  . pantoprazole (PROTONIX) IV  40 mg Intravenous BID  . polyethylene glycol  17 g Oral Daily  . senna-docusate  2 tablet Oral QHS  . sodium chloride  10-40 mL Intracatheter Q12H  . thiamine  100 mg Oral Daily  . vancomycin  1,250 mg Intravenous Q12H         WUJ:WJXBJ from the symptoms mentioned above,there are no other symptoms referable to all systems reviewed.  Physical Exam: Vital signs in last 24 hours: Temp:  [99.4 F (37.4 C)-103.1 F (39.5 C)] 100.7 F (38.2 C) (04/02 0800) Pulse Rate:  [109-133] 124  (04/02 1000) Resp:  [12-31] 23  (04/02 0800) BP: (124-157)/(75-111) 132/91 mmHg (04/02 1000) SpO2:  [90 %-100 %] 97 % (04/02 1000) Weight:  [259 lb 0.7 oz (117.5 kg)] 259 lb 0.7 oz (117.5 kg) (04/02 0500) Weight change: -1 lb 8.7 oz (-0.7 kg) Last BM Date: 01/07/12  Intake/Output from previous day: 04/01 0701 - 04/02 0700 In: 2916.3 [P.O.:300; I.V.:1200; IV Piggyback:300; TPN:1116.3] Out: 2501 [Urine:2500; Stool:1] Total I/O In: 1043.7 [P.O.:480; I.V.:300; TPN:263.7] Out: 900 [Urine:900]   Physical Exam: General- pt is awake,alert, oriented to time place and person Resp- Mild  REsp distress,Wheezing Present  CVS- S1S2 regular ij rate and rhythm GIT- BS+decreased, Distended ( Worse than yesterday) EXT-  2+LE Edema, NO Cyanosis   Lab Results: CBC  Basename 01/08/12 1030 01/08/12 0420  WBC 11.2* 10.5  HGB 8.7* 9.1*  HCT 25.8* 27.3*  PLT 271 261    BMET  Basename 01/07/12 0503 01/06/12 0850  NA 136 139  K 3.6 3.7  CL 98 100  CO2 30 29  GLUCOSE 133* 138*  BUN 21 22  CREATININE 1.87* 1.96*  CALCIUM 8.4 8.1*   Trend  Creat 5.45==>4.53==>3.73==>2.0==>1.97 ==>1.87  Calcium 4.8==>5.0==>5.2 ==>6.6==>7.4 ==>8.4  Alb 2.6  Sodium 135==>126==>129==>135==>139==>136       MICRO Recent Results (from the past 240 hour(s))  CULTURE, BLOOD (ROUTINE X 2)     Status: Normal   Collection Time   12/30/11  1:45 PM      Component Value Range Status Comment   Specimen Description BLOOD LEFT HAND   Final    Special Requests BOTTLES DRAWN AEROBIC ONLY 4CC ONE BOTTLE   Final    Culture NO GROWTH 9 DAYS   Final    Report Status 01/08/2012 FINAL   Final   MRSA PCR SCREENING     Status: Normal   Collection Time   12/30/11  1:49 PM      Component Value Range Status Comment   MRSA by PCR NEGATIVE  NEGATIVE  Final   CULTURE, BLOOD (ROUTINE X 2)     Status: Normal   Collection Time  12/30/11  2:00 PM      Component Value Range Status Comment   Specimen Description BLOOD LEFT ARM   Final    Special Requests     Final    Value: BOTTLES DRAWN AEROBIC AND ANAEROBIC 4CC EACH BOTTLE   Culture NO GROWTH 9 DAYS   Final    Report Status 01/08/2012 FINAL   Final   URINE CULTURE     Status: Normal   Collection Time   12/30/11  3:10 PM      Component Value Range Status Comment   Specimen Description URINE, CATHETERIZED   Final    Special Requests NONE   Final    Culture  Setup Time [id number]   Final    Colony Count NO GROWTH   Final    Culture NO GROWTH   Final    Report Status 12/31/2011 FINAL   Final   CULTURE, BLOOD (ROUTINE X 2)     Status: Normal (Preliminary result)   Collection Time   01/05/12  8:04 AM      Component Value Range Status Comment   Specimen Description Blood PICC LINE    Final    Special Requests     Final    Value: BOTTLES DRAWN AEROBIC AND ANAEROBIC 6CC DRAWN BY RN   Culture NO GROWTH 3 DAYS   Final    Report Status PENDING   Incomplete   CULTURE, BLOOD (ROUTINE X 2)     Status: Normal (Preliminary result)   Collection Time   01/05/12  8:12 AM      Component Value Range Status Comment   Specimen Description Blood LEFT ANTECUBITAL   Final    Special Requests BOTTLES DRAWN AEROBIC AND ANAEROBIC 7CC   Final    Culture NO GROWTH 3 DAYS   Final    Report Status PENDING   Incomplete   CULTURE, BLOOD (ROUTINE X 2)     Status: Normal (Preliminary result)   Collection Time   01/07/12  8:25 PM      Component Value Range Status Comment   Specimen Description LEFT ANTECUBITAL   Final    Special Requests BOTTLES DRAWN AEROBIC AND ANAEROBIC 6CC   Final    Culture NO GROWTH 1 DAY   Final    Report Status PENDING   Incomplete   CULTURE, BLOOD (ROUTINE X 2)     Status: Normal (Preliminary result)   Collection Time   01/07/12  8:26 PM      Component Value Range Status Comment   Specimen Description LEFT ANTECUBITAL   Final    Special Requests BOTTLES DRAWN AEROBIC AND ANAEROBIC 6CC   Final    Culture NO GROWTH 1 DAY   Final    Report Status PENDING   Incomplete       Lab Results  Component Value Date   CALCIUM 8.4 01/07/2012   CAION CANCELLED BY LAB 01/03/2012   PHOS 2.2* 01/07/2012               Impression: 1)Renal  AKI secondary to Contrast Induced ATN With some contribution from SIRS as Pancreatitits  AKI most Likely Nonoliguric ATN  Creatinine is trending down  Non Oliguiric ATN    2)HTN BP at goal  In SIRS state -will not aim for 120's   3)Anemia HGb not at goal (9--11)  Dilutional + Acute sickness+ CT showed Retroperitoneal bleed  Primary following for need of Transfusion   4)GI- Admitted with Pancreatitis           ?  Worsening             GI and Surgery on board.  5)Hypocalcium- Secondary to asso with Fats after Pancreatitis'  Now  better   6)HypoNatremia - Now better   7)Acid base  Co2 at goal   8) ID- Pt still mounting fever  Now started on IV Diflucan+ Primaxin + vanco   9) Prognosis- Guarded- pt clinically worsening, mounting fever and abdomen more distended + feeling worse than yesterday+ Rising WBC    Plan:  Will get BMet Will d/c clonidine Will keep Lasix same for now CT most likely will be done after surgery input       Alisabeth Selkirk S 01/08/2012, 11:11 AM

## 2012-01-08 NOTE — Progress Notes (Signed)
Urine sample sent culture  also

## 2012-01-08 NOTE — Progress Notes (Signed)
Pt is resting with eyes closed lying on bed. Family members visiting at bedside.  Medicated at 2130 for abdominal pain. Pt states relief.  Offers no other complaints at this time.  Will continue to monitor throughout shift.

## 2012-01-09 ENCOUNTER — Inpatient Hospital Stay (HOSPITAL_COMMUNITY): Payer: Self-pay

## 2012-01-09 ENCOUNTER — Encounter (HOSPITAL_COMMUNITY): Payer: Self-pay

## 2012-01-09 DIAGNOSIS — K859 Acute pancreatitis without necrosis or infection, unspecified: Secondary | ICD-10-CM

## 2012-01-09 LAB — GLUCOSE, CAPILLARY
Glucose-Capillary: 124 mg/dL — ABNORMAL HIGH (ref 70–99)
Glucose-Capillary: 117 mg/dL — ABNORMAL HIGH (ref 70–99)
Glucose-Capillary: 134 mg/dL — ABNORMAL HIGH (ref 70–99)

## 2012-01-09 LAB — COMPREHENSIVE METABOLIC PANEL
ALT: 23 U/L (ref 0–53)
Albumin: 2 g/dL — ABNORMAL LOW (ref 3.5–5.2)
Alkaline Phosphatase: 74 U/L (ref 39–117)
BUN: 24 mg/dL — ABNORMAL HIGH (ref 6–23)
Chloride: 97 mEq/L (ref 96–112)
Potassium: 3.9 mEq/L (ref 3.5–5.1)
Sodium: 133 mEq/L — ABNORMAL LOW (ref 135–145)
Total Bilirubin: 0.5 mg/dL (ref 0.3–1.2)

## 2012-01-09 LAB — URINALYSIS, ROUTINE W REFLEX MICROSCOPIC
Bilirubin Urine: NEGATIVE
Specific Gravity, Urine: 1.02 (ref 1.005–1.030)
pH: 6 (ref 5.0–8.0)

## 2012-01-09 LAB — URINE CULTURE
Colony Count: NO GROWTH
Culture  Setup Time: 201304030205
Culture: NO GROWTH

## 2012-01-09 LAB — CBC
HCT: 23.8 % — ABNORMAL LOW (ref 39.0–52.0)
Hemoglobin: 7.8 g/dL — ABNORMAL LOW (ref 13.0–17.0)
RDW: 14.9 % (ref 11.5–15.5)
WBC: 9.4 10*3/uL (ref 4.0–10.5)

## 2012-01-09 LAB — LIPASE, BLOOD: Lipase: 91 U/L — ABNORMAL HIGH (ref 11–59)

## 2012-01-09 MED ORDER — M.V.I. ADULT IV INJ
INJECTION | INTRAVENOUS | Status: AC
  Administered 2012-01-09: 19:00:00 via INTRAVENOUS
  Filled 2012-01-09: qty 2000

## 2012-01-09 MED ORDER — POLYETHYLENE GLYCOL 3350 17 G PO PACK: 17.0000 g | PACK | Freq: Every day | ORAL | Status: DC | PRN

## 2012-01-09 NOTE — Progress Notes (Signed)
Subjective: Pain about the same.  No nausea.  No BM.  ?flatus.  Objective: Vital signs in last 24 hours: Temp:  [99.4 F (37.4 C)-101.8 F (38.8 C)] 99.4 F (37.4 C) (04/03 2000) Pulse Rate:  [106-126] 121  (04/03 2100) Resp:  [12-33] 20  (04/03 2100) BP: (113-156)/(72-97) 142/89 mmHg (04/03 2100) SpO2:  [94 %-100 %] 94 % (04/03 2100) Weight:  [120.1 kg (264 lb 12.4 oz)] 120.1 kg (264 lb 12.4 oz) (04/03 0500) Last BM Date: 01/09/12  Intake/Output from previous day: 04/02 0701 - 04/03 0700 In: 4036.5 [P.O.:720; I.V.:1500; IV Piggyback:300; TPN:1516.5] Out: 2751 [Urine:2750; Stool:1] Intake/Output this shift: Total I/O In: 3417.5 [I.V.:1047.5; IV Piggyback:1250; TPN:1120] Out: 300 [Urine:300]  General appearance: alert and mild distress GI: quite, soft, moderate-severe upper abdominal pain.  No diffuse peritoneal signs.    Lab Results:   Basename 01/09/12 0444 01/08/12 1030  WBC 9.4 11.2*  HGB 7.8* 8.7*  HCT 23.8* 25.8*  PLT 280 271   BMET  Basename 01/09/12 0444 01/08/12 0420  NA 133* 134*  K 3.9 3.9  CL 97 96  CO2 30 29  GLUCOSE 137* 141*  BUN 24* 23  CREATININE 1.89* 1.93*  CALCIUM 8.2* 8.4   PT/INR No results found for this basename: LABPROT:2,INR:2 in the last 72 hours ABG No results found for this basename: PHART:2,PCO2:2,PO2:2,HCO3:2 in the last 72 hours  Studies/Results: Dg Chest Port 1 View  01/09/2012  *RADIOLOGY REPORT*  Clinical Data: PICC line  PORTABLE CHEST - 1 VIEW  Comparison: 01/03/2012  Findings: Tip of the right PICC line is at the cavoatrial junction. Low volumes.  Moderate cardiomegaly.  Bibasilar atelectasis. Bilateral pleural effusions.  IMPRESSION: PICC tip at cavoatrial junction  Bilateral pleural effusions and bibasilar atelectasis.  Original Report Authenticated By: Donavan Burnet, M.D.    Anti-infectives: Anti-infectives     Start     Dose/Rate Route Frequency Ordered Stop   01/07/12 2100   vancomycin (VANCOCIN) 1,250 mg in  sodium chloride 0.9 % 250 mL IVPB        1,250 mg 166.7 mL/hr over 90 Minutes Intravenous Every 12 hours 01/07/12 1011     01/07/12 0900   fluconazole (DIFLUCAN) IVPB 200 mg        200 mg 100 mL/hr over 60 Minutes Intravenous Every 24 hours 01/07/12 0740     01/05/12 0900   vancomycin (VANCOCIN) IVPB 1000 mg/200 mL premix  Status:  Discontinued        1,000 mg 200 mL/hr over 60 Minutes Intravenous Every 12 hours 01/05/12 0737 01/07/12 1011   01/02/12 1400   imipenem-cilastatin (PRIMAXIN) 500 mg in sodium chloride 0.9 % 100 mL IVPB        500 mg 200 mL/hr over 30 Minutes Intravenous 3 times per day 01/02/12 0838     12/30/11 1500   imipenem-cilastatin (PRIMAXIN) 250 mg in sodium chloride 0.9 % 100 mL IVPB  Status:  Discontinued        250 mg 200 mL/hr over 30 Minutes Intravenous 4 times per day 12/30/11 1321 01/02/12 0838   12/30/11 1400   imipenem-cilastatin (PRIMAXIN) 500 mg in sodium chloride 0.9 % 100 mL IVPB  Status:  Discontinued        500 mg 200 mL/hr over 30 Minutes Intravenous 3 times per day 12/30/11 1314 12/30/11 1321          Assessment/Plan: s/p * No surgery found * Acute pancreatitis.  NO acute surgical indications.  Await bowel function.  LOS: 11 days    Peter [name] C 01/09/2012

## 2012-01-09 NOTE — Progress Notes (Signed)
Subjective: No N/V. Reports left and right side abdominal pain, last BM yesterday, feels abdomen is tight. Intermittent coughing but non-productive. Seen by Dr. Leticia Penna yesterday evening. No acute surgical indications per note: monitor WBC, consider re-eval with CT if continued temps. Tmax 102.9   Objective: Vital signs in last 24 hours: Temp:  [99.5 F (37.5 C)-102.9 F (39.4 C)] 99.5 F (37.5 C) (04/03 0800) Pulse Rate:  [106-128] 106  (04/03 0800) Resp:  [12-20] 14  (04/03 0800) BP: (113-158)/(72-96) 113/72 mmHg (04/03 0800) SpO2:  [87 %-100 %] 98 % (04/03 0800) Weight:  [264 lb 12.4 oz (120.1 kg)] 264 lb 12.4 oz (120.1 kg) (04/03 0500) Last BM Date: 01/08/12 General:   Alert and oriented, pleasant but appears uncomfortable Head:  Normocephalic and atraumatic. Eyes:  No icterus, sclera clear. Conjuctiva pink.  Heart:  S1, S2 present, no murmurs noted. Tachycardic in 1teens Abdomen:  Hypoactive BS, distended, firm. No guarding or rebound.  Msk:  Symmetrical without gross deformities. Normal posture. Extremities:  2+ bilateral lower extremity edema Neurologic:  Alert and  oriented x4;  grossly normal neurologically. Skin:  Warm and dry, intact without significant lesions.  Psych:  Alert and cooperative. Normal mood and affect.  Intake/Output from previous day: 04/02 0701 - 04/03 0700 In: 3736.5 [P.O.:720; I.V.:1500; TPN:1516.5] Out: 2751 [Urine:2750; Stool:1] Intake/Output this shift: Total I/O In: 240 [P.O.:240] Out: 200 [Urine:200]  Lab Results:  Basename 01/09/12 0444 01/08/12 1030 01/08/12 0420  WBC 9.4 11.2* 10.5  HGB 7.8* 8.7* 9.1*  HCT 23.8* 25.8* 27.3*  PLT 280 271 261   BMET  Basename 01/09/12 0444 01/08/12 0420 01/07/12 0503  NA 133* 134* 136  K 3.9 3.9 3.6  CL 97 96 98  CO2 30 29 30   GLUCOSE 137* 141* 133*  BUN 24* 23 21  CREATININE 1.89* 1.93* 1.87*  CALCIUM 8.2* 8.4 8.4   LFT  Basename 01/09/12 0444 01/07/12 0503  PROT 6.2 6.7  ALBUMIN 2.0*  2.3*  AST 61* 80*  ALT 23 28  ALKPHOS 74 57  BILITOT 0.5 0.8  BILIDIR -- --  IBILI -- --    Lipase 4/3: 91              4/2: 108  Studies/Results: Dg Abd Portable 1v  01/07/2012  *RADIOLOGY REPORT*  Clinical Data: Abdominal pain  PORTABLE ABDOMEN - 1 VIEW  Comparison:  01/04/2012  Findings: Persistent moderate small bowel gaseous distention probable due to ileus or partial bowel obstruction.  Moderate gaseous distention of the transverse colon.  Contrast material from recent CT scan noted in distal colon.  IMPRESSION: Persistent moderate small bowel gaseous distention probable due to ileus or partial bowel obstruction.  Moderate gaseous distention of the transverse colon.  Contrast material from recent CT scan noted in distal colon.  Original Report Authenticated By: Natasha Mead, M.D.    Assessment: 41 year old male with acute pancreatitis, secondary to etoh +/- hypertriglyceridemia w/ SIRS, condition remains guarded, notable abdominal pain and distension with ileus/pSBO, retroperitoneal bleed with Hgb 7.8 today. cdiff negative, blood cultures negative thus far. Condition remains guarded but overall no deterioration in status, no acute indication for surgical intervention. Tmax past 24 hours 102.9. Consider CT scan if continued temps.    Plan: Continue NPO, TPN Supportive measures CBC in am Appreciate surgical consult Consider CT if continued spikes in temp  LOS: 11 days   Peter [name]  01/09/2012, 8:19 AM

## 2012-01-09 NOTE — Progress Notes (Signed)
Subjective: No new complaints.  Objective: Vital signs in last 24 hours: Filed Vitals:   01/09/12 0500 01/09/12 0700 01/09/12 0735 01/09/12 0800  BP:  120/76  113/72  Pulse:  108  106  Temp:    99.5 F (37.5 C)  TempSrc:    Oral  Resp:  12  14  Height:      Weight: 120.1 kg (264 lb 12.4 oz)     SpO2:  98% 97% 98%   Weight change: 2.6 kg (5 lb 11.7 oz)  Intake/Output Summary (Last 24 hours) at 01/09/12 0937 Last data filed at 01/09/12 0919  Gross per 24 hour  Intake 3200.58 ml  Output   2351 ml  Net 849.58 ml   Physical Exam: General: Alert. Comfortable supine Lungs clear to auscultation bilaterally without wheeze rhonchi or rales Cardiovascular regular rate rhythm without murmurs gallops rubs Abdomen: Slightly less distended today. Bowel sounds quiet. Extremities: Sequential compression devices  Lab Results: Basic Metabolic Panel:  Lab 01/09/12 1610 01/08/12 0420 01/07/12 0503 01/05/12 0516  NA 133* 134* -- --  K 3.9 3.9 -- --  CL 97 96 -- --  CO2 30 29 -- --  GLUCOSE 137* 141* -- --  BUN 24* 23 -- --  CREATININE 1.89* 1.93* -- --  CALCIUM 8.2* 8.4 -- --  MG -- -- 2.3 2.4  PHOS -- -- 2.2* 2.2*   Liver Function Tests:  Lab 01/09/12 0444 01/07/12 0503  AST 61* 80*  ALT 23 28  ALKPHOS 74 57  BILITOT 0.5 0.8  PROT 6.2 6.7  ALBUMIN 2.0* 2.3*    Lab 01/09/12 0444 01/08/12 0420  LIPASE 91* 108*  AMYLASE -- --   No results found for this basename: AMMONIA:2 in the last 168 hours CBC:  Lab 01/09/12 0444 01/08/12 1030 01/07/12 0503 01/05/12 0516  WBC 9.4 11.2* -- --  NEUTROABS -- -- 11.7* 8.6*  HGB 7.8* 8.7* -- --  HCT 23.8* 25.8* -- --  MCV 86.5 86.3 -- --  PLT 280 271 -- --   Cardiac Enzymes:  Lab 01/08/12 0420 01/07/12 0503 01/06/12 0849  CKTOTAL 1244* 1224* 1537*  CKMB -- -- --  CKMBINDEX -- -- --  TROPONINI -- -- --   BNP: No results found for this basename: PROBNP:3 in the last 168 hours D-Dimer: No results found for this basename:  DDIMER:2 in the last 168 hours CBG:  Lab 01/09/12 0732 01/09/12 0428 01/08/12 2357 01/08/12 2000 01/08/12 1613 01/08/12 1136  GLUCAP 131* 124* 115* 119* 103* 127*   Hemoglobin A1C: No results found for this basename: HGBA1C in the last 168 hours Fasting Lipid Panel:  Lab 01/07/12 0504  CHOL 149  HDL --  LDLCALC --  TRIG 203*  CHOLHDL --  LDLDIRECT --   Thyroid Function Tests: No results found for this basename: TSH,T4TOTAL,FREET4,T3FREE,THYROIDAB in the last 168 hours Coagulation: No results found for this basename: LABPROT:4,INR:4 in the last 168 hours Anemia Panel:  Lab 01/03/12 1456  VITAMINB12 1188*  FOLATE 15.3  FERRITIN 2243*  TIBC 165*  IRON 25*  RETICCTPCT 2.3   Urine Drug Screen: Drugs of Abuse     Component Value Date/Time   LABOPIA POSITIVE* 12/30/2011 1433   COCAINSCRNUR NONE DETECTED 12/30/2011 1433   LABBENZ NONE DETECTED 12/30/2011 1433   AMPHETMU NONE DETECTED 12/30/2011 1433   THCU NONE DETECTED 12/30/2011 1433   LABBARB NONE DETECTED 12/30/2011 1433    Alcohol Level: No results found for this basename: ETH:2 in the last  168 hours Urinalysis:  Lab 01/09/12 0834  COLORURINE YELLOW  LABSPEC 1.020  PHURINE 6.0  GLUCOSEU NEGATIVE  HGBUR LARGE*  BILIRUBINUR NEGATIVE  KETONESUR NEGATIVE  PROTEINUR 30*  UROBILINOGEN 0.2  NITRITE NEGATIVE  LEUKOCYTESUR NEGATIVE   Pro calcitonin 1.36  Micro Results: Recent Results (from the past 240 hour(s))  CULTURE, BLOOD (ROUTINE X 2)     Status: Normal   Collection Time   12/30/11  1:45 PM      Component Value Range Status Comment   Specimen Description BLOOD LEFT HAND   Final    Special Requests BOTTLES DRAWN AEROBIC ONLY 4CC ONE BOTTLE   Final    Culture NO GROWTH 9 DAYS   Final    Report Status 01/08/2012 FINAL   Final   MRSA PCR SCREENING     Status: Normal   Collection Time   12/30/11  1:49 PM      Component Value Range Status Comment   MRSA by PCR NEGATIVE  NEGATIVE  Final   CULTURE, BLOOD  (ROUTINE X 2)     Status: Normal   Collection Time   12/30/11  2:00 PM      Component Value Range Status Comment   Specimen Description BLOOD LEFT ARM   Final    Special Requests     Final    Value: BOTTLES DRAWN AEROBIC AND ANAEROBIC 4CC EACH BOTTLE   Culture NO GROWTH 9 DAYS   Final    Report Status 01/08/2012 FINAL   Final   URINE CULTURE     Status: Normal   Collection Time   12/30/11  3:10 PM      Component Value Range Status Comment   Specimen Description URINE, CATHETERIZED   Final    Special Requests NONE   Final    Culture  Setup Time [id number]   Final    Colony Count NO GROWTH   Final    Culture NO GROWTH   Final    Report Status 12/31/2011 FINAL   Final   CULTURE, BLOOD (ROUTINE X 2)     Status: Normal (Preliminary result)   Collection Time   01/05/12  8:04 AM      Component Value Range Status Comment   Specimen Description Blood PICC LINE   Final    Special Requests     Final    Value: BOTTLES DRAWN AEROBIC AND ANAEROBIC 6CC DRAWN BY RN   Culture NO GROWTH 3 DAYS   Final    Report Status PENDING   Incomplete   CULTURE, BLOOD (ROUTINE X 2)     Status: Normal (Preliminary result)   Collection Time   01/05/12  8:12 AM      Component Value Range Status Comment   Specimen Description Blood LEFT ANTECUBITAL   Final    Special Requests BOTTLES DRAWN AEROBIC AND ANAEROBIC 7CC   Final    Culture NO GROWTH 3 DAYS   Final    Report Status PENDING   Incomplete   CULTURE, BLOOD (ROUTINE X 2)     Status: Normal (Preliminary result)   Collection Time   01/07/12  8:25 PM      Component Value Range Status Comment   Specimen Description LEFT ANTECUBITAL   Final    Special Requests BOTTLES DRAWN AEROBIC AND ANAEROBIC 6CC   Final    Culture NO GROWTH 1 DAY   Final    Report Status PENDING   Incomplete   CULTURE, BLOOD (ROUTINE X 2)  Status: Normal (Preliminary result)   Collection Time   01/07/12  8:26 PM      Component Value Range Status Comment   Specimen Description LEFT  ANTECUBITAL   Final    Special Requests BOTTLES DRAWN AEROBIC AND ANAEROBIC 6CC   Final    Culture NO GROWTH 1 DAY   Final    Report Status PENDING   Incomplete   CLOSTRIDIUM DIFFICILE BY PCR     Status: Normal   Collection Time   01/08/12  7:02 PM      Component Value Range Status Comment   C difficile by pcr NEGATIVE  NEGATIVE  Final    Studies/Results: No results found. Scheduled Meds:    . fluconazole (DIFLUCAN) IV  200 mg Intravenous Q24H  . furosemide  60 mg Intravenous BID  . imipenem-cilastatin  500 mg Intravenous Q8H  . insulin aspart  0-20 Units Subcutaneous Q4H  . levalbuterol  0.63 mg Nebulization Q6H  . pantoprazole (PROTONIX) IV  40 mg Intravenous BID  . polyethylene glycol  17 g Oral Daily  . senna-docusate  2 tablet Oral QHS  . sodium chloride  10-40 mL Intracatheter Q12H  . thiamine  100 mg Oral Daily  . vancomycin  1,250 mg Intravenous Q12H  . DISCONTD: cloNIDine  0.1 mg Oral BID   Continuous Infusions:    . sodium chloride 1,000 mL (01/08/12 0737)  . TPN (CLINIMIX) +/- additives 80 mL/hr at 01/08/12 1830  . TPN (CLINIMIX) +/- additives 70 mL/hr at 01/08/12 0600   PRN Meds:.acetaminophen, albuterol, bisacodyl, HYDROmorphone, LORazepam, ondansetron (ZOFRAN) IV, phenol, sodium chloride Assessment/Plan:   *Acute pancreatitis, severe. Continue TPN. No current indication for surgery. Abdominal exam slightly less distention today.  Fever: C. difficile negative. Repeat urinalysis improved. Urine culture and repeat blood cultures negative to date. Pro calcitonin much improved from last week. Continue current antibiotics for now. White blood cell count normal   ARF (acute renal failure), continues to improve.  Alcohol abuse   Rhabdomyolysis, resolving   HTN (hypertension): normotensive off medications   Hepatic steatosis   Hypocalcemia improved   Ileus, improving. Had 2 bowel movements today.   Anemia due to dilution, critical illness, pancreatitis,  cannot rule out retroperitoneal bleed. No evidence of GI bleed. Continue to monitor.   Hypoalbuminemia   Peripheral edema   Possible Retroperitoneal bleed, has been off subcutaneous heparin for several days.   Hyperglycemia  Overall, looks a little better today. Appreciate consultants.   LOS: 11 days   Walaa Carel L 01/09/2012, 9:37 AM

## 2012-01-09 NOTE — Progress Notes (Addendum)
NO NV. DIL Q3H. ILEUS DUE TO MEDS /PNACREATITIS. ADVANCE TO FULL LIQUIDS. CONTINUE TO AMBULATE.CHANGE MIRALAX TO PRN. CAN CONTRIBUTE TO INCREASED INTESTINAL GAS. REVIEWED.

## 2012-01-09 NOTE — Consult Note (Signed)
PARENTERAL NUTRITION CONSULT NOTE   Pharmacy Consult for TPN Indication: severe pancreatitis  Allergies  Allergen Reactions  . Ace Inhibitors Swelling    Lip swelling   Patient Measurements: Height: 5\' 10"  (177.8 cm) Weight: 264 lb 12.4 oz (120.1 kg) IBW/kg (Calculated) : 73  Adjusted Body Weight: 87KG  Vital Signs: Temp: 99.5 F (37.5 C) (04/03 0800) Temp src: Oral (04/03 0800) BP: 113/72 mmHg (04/03 0800) Pulse Rate: 106  (04/03 0800) Intake/Output from previous day: 04/02 0701 - 04/03 0700 In: 3736.5 [P.O.:720; I.V.:1500; TPN:1516.5] Out: 2751 [Urine:2750; Stool:1] Intake/Output from this shift: Total I/O In: 250 [P.O.:240; I.V.:10] Out: 1100 [Urine:1100]  Labs:  [facility] 01/09/12 0444 01/08/12 1030 01/08/12 0420  WBC 9.4 11.2* 10.5  HGB 7.8* 8.7* 9.1*  HCT 23.8* 25.8* 27.3*  PLT 280 271 261  APTT -- -- --  INR -- -- --    Basename 01/09/12 0444 01/08/12 0420 01/07/12 0504 01/07/12 0503  NA 133* 134* -- 136  K 3.9 3.9 -- 3.6  CL 97 96 -- 98  CO2 30 29 -- 30  GLUCOSE 137* 141* -- 133*  BUN 24* 23 -- 21  CREATININE 1.89* 1.93* -- 1.87*  LABCREA -- -- -- --  CREAT24HRUR -- -- -- --  CALCIUM 8.2* 8.4 -- 8.4  MG -- -- -- 2.3  PHOS -- -- -- 2.2*  PROT 6.2 -- -- 6.7  ALBUMIN 2.0* -- -- 2.3*  AST 61* -- -- 80*  ALT 23 -- -- 28  ALKPHOS 74 -- -- 57  BILITOT 0.5 -- -- 0.8  BILIDIR -- -- -- --  IBILI -- -- -- --  PREALBUMIN -- -- -- 5.4*  TRIG -- -- 203* --  CHOLHDL -- -- -- --  CHOL -- -- 149 --   Estimated Creatinine Clearance: 65.4 ml/min (by C-G formula based on Cr of 1.89).    Basename 01/09/12 0732 01/09/12 0428 01/08/12 2357  GLUCAP 131* 124* 115*   Medical History: Past Medical History  Diagnosis Date  . Hypertension   . Ileus 01/03/2012  . Retroperitoneal bleed 01/04/2012   Medications:  Scheduled:     . fluconazole (DIFLUCAN) IV  200 mg Intravenous Q24H  . furosemide  60 mg Intravenous BID  . imipenem-cilastatin  500 mg  Intravenous Q8H  . insulin aspart  0-20 Units Subcutaneous Q4H  . levalbuterol  0.63 mg Nebulization Q6H  . pantoprazole (PROTONIX) IV  40 mg Intravenous BID  . polyethylene glycol  17 g Oral Daily  . senna-docusate  2 tablet Oral QHS  . sodium chloride  10-40 mL Intracatheter Q12H  . thiamine  100 mg Oral Daily  . vancomycin  1,250 mg Intravenous Q12H  . DISCONTD: cloNIDine  0.1 mg Oral BID   Insulin Requirements in the past 24 hours:  SSI + insulin in TPN (~ 12 units given)  Current Nutrition:  TPN at 60ml/hr  Assessment: Pt reportedly requesting food Obesity Severe pancreatitis Hypoalbuminemia, prealbumin remains low Slightly Hyponatremic Hypocalcemia resolved Hyperglycemia due to TPN, improving, adjusting insulin SCr improving  Nutritional Goals:  2000-2100 kCal, 100-120 grams of protein per day  Plan: Advance TPN to 45ml/hr today (Clinimix E 5/15) NO LIPID (FAT) INFUSION per Dr Darrick Penna (d/w MD) increase regular insulin 30 units/Liter (60 units per bag) Continue IVF (saline), rate per MD MVI and Trace Elements every Mon-Wed-Fri (shortage) F/U Lytes, fluid status, renal fxn, glucose tolerance tomorrow  Margo Aye, Azha Constantin A 01/09/2012,10:36 AM

## 2012-01-09 NOTE — Progress Notes (Signed)
Peter [name]  MRN: [id number]  DOB/AGE: 04/19/1971 41 y.o.  Primary Care Physician:GOLDING,Ashtyn [name], MD, MD  Admit date: 12/29/2011  Chief Complaint:  Chief Complaint  Patient presents with  . Abdominal Pain  . Emesis  . Diarrhea    S-Pt presented on  12/29/2011 with  Chief Complaint  Patient presents with  . Abdominal Pain  . Emesis  . Diarrhea  .    Pt today feels better than yesterday    Meds    . fluconazole (DIFLUCAN) IV  200 mg Intravenous Q24H  . furosemide  60 mg Intravenous BID  . imipenem-cilastatin  500 mg Intravenous Q8H  . insulin aspart  0-20 Units Subcutaneous Q4H  . levalbuterol  0.63 mg Nebulization Q6H  . pantoprazole (PROTONIX) IV  40 mg Intravenous BID  . polyethylene glycol  17 g Oral Daily  . senna-docusate  2 tablet Oral QHS  . sodium chloride  10-40 mL Intracatheter Q12H  . thiamine  100 mg Oral Daily  . vancomycin  1,250 mg Intravenous Q12H  . DISCONTD: cloNIDine  0.1 mg Oral BID         WUJ:WJXBJ from the symptoms mentioned above,there are no other symptoms referable to all systems reviewed.  Physical Exam: Vital signs in last 24 hours: Temp:  [99.5 F (37.5 C)-102.9 F (39.4 C)] 99.5 F (37.5 C) (04/03 0800) Pulse Rate:  [106-128] 106  (04/03 0800) Resp:  [12-20] 14  (04/03 0800) BP: (113-158)/(72-96) 113/72 mmHg (04/03 0800) SpO2:  [87 %-100 %] 98 % (04/03 0800) Weight:  [264 lb 12.4 oz (120.1 kg)] 264 lb 12.4 oz (120.1 kg) (04/03 0500) Weight change: 5 lb 11.7 oz (2.6 kg) Last BM Date: 01/08/12  Intake/Output from previous day: 04/02 0701 - 04/03 0700 In: 3736.5 [P.O.:720; I.V.:1500; TPN:1516.5] Out: 2751 [Urine:2750; Stool:1] Total I/O In: 240 [P.O.:240] Out: 200 [Urine:200]   Physical Exam: General- pt is awake,alert, oriented to time place and person  Resp- NO resp distress, Decreased Bs at bases. Otherwise clear CVS S1 S2  Regular in rate , tachy, regular in rhtythm GIT- BS+decreased, Distended EXT- 1+LE  Edema, NO Cyanosis   Lab Results: CBC  Basename 01/09/12 0444 01/08/12 1030  WBC 9.4 11.2*  HGB 7.8* 8.7*  HCT 23.8* 25.8*  PLT 280 271    BMET  Basename 01/09/12 0444 01/08/12 0420  NA 133* 134*  K 3.9 3.9  CL 97 96  CO2 30 29  GLUCOSE 137* 141*  BUN 24* 23  CREATININE 1.89* 1.93*  CALCIUM 8.2* 8.4    Trend  Creat 5.45==>4.53==>3.73==>2.0==>1.97 ==>1.87==>1.89  Calcium 4.8==>5.0==>5.2 ==>6.6==>7.4 ==>8.4==>8.2  Alb 2.6  Sodium 135==>126==>129==>135==>139==>136 ==>133   Corrected ca  8.2+1=9.2    MICRO Recent Results (from the past 240 hour(s))  CULTURE, BLOOD (ROUTINE X 2)     Status: Normal   Collection Time   12/30/11  1:45 PM      Component Value Range Status Comment   Specimen Description BLOOD LEFT HAND   Final    Special Requests BOTTLES DRAWN AEROBIC ONLY 4CC ONE BOTTLE   Final    Culture NO GROWTH 9 DAYS   Final    Report Status 01/08/2012 FINAL   Final   MRSA PCR SCREENING     Status: Normal   Collection Time   12/30/11  1:49 PM      Component Value Range Status Comment   MRSA by PCR NEGATIVE  NEGATIVE  Final   CULTURE, BLOOD (ROUTINE X 2)  Status: Normal   Collection Time   12/30/11  2:00 PM      Component Value Range Status Comment   Specimen Description BLOOD LEFT ARM   Final    Special Requests     Final    Value: BOTTLES DRAWN AEROBIC AND ANAEROBIC 4CC EACH BOTTLE   Culture NO GROWTH 9 DAYS   Final    Report Status 01/08/2012 FINAL   Final   URINE CULTURE     Status: Normal   Collection Time   12/30/11  3:10 PM      Component Value Range Status Comment   Specimen Description URINE, CATHETERIZED   Final    Special Requests NONE   Final    Culture  Setup Time [id number]   Final    Colony Count NO GROWTH   Final    Culture NO GROWTH   Final    Report Status 12/31/2011 FINAL   Final   CULTURE, BLOOD (ROUTINE X 2)     Status: Normal (Preliminary result)   Collection Time   01/05/12  8:04 AM      Component Value Range Status Comment    Specimen Description Blood PICC LINE   Final    Special Requests     Final    Value: BOTTLES DRAWN AEROBIC AND ANAEROBIC 6CC DRAWN BY RN   Culture NO GROWTH 3 DAYS   Final    Report Status PENDING   Incomplete   CULTURE, BLOOD (ROUTINE X 2)     Status: Normal (Preliminary result)   Collection Time   01/05/12  8:12 AM      Component Value Range Status Comment   Specimen Description Blood LEFT ANTECUBITAL   Final    Special Requests BOTTLES DRAWN AEROBIC AND ANAEROBIC 7CC   Final    Culture NO GROWTH 3 DAYS   Final    Report Status PENDING   Incomplete   CULTURE, BLOOD (ROUTINE X 2)     Status: Normal (Preliminary result)   Collection Time   01/07/12  8:25 PM      Component Value Range Status Comment   Specimen Description LEFT ANTECUBITAL   Final    Special Requests BOTTLES DRAWN AEROBIC AND ANAEROBIC 6CC   Final    Culture NO GROWTH 1 DAY   Final    Report Status PENDING   Incomplete   CULTURE, BLOOD (ROUTINE X 2)     Status: Normal (Preliminary result)   Collection Time   01/07/12  8:26 PM      Component Value Range Status Comment   Specimen Description LEFT ANTECUBITAL   Final    Special Requests BOTTLES DRAWN AEROBIC AND ANAEROBIC 6CC   Final    Culture NO GROWTH 1 DAY   Final    Report Status PENDING   Incomplete   CLOSTRIDIUM DIFFICILE BY PCR     Status: Normal   Collection Time   01/08/12  7:02 PM      Component Value Range Status Comment   C difficile by pcr NEGATIVE  NEGATIVE  Final       Lab Results  Component Value Date   CALCIUM 8.2* 01/09/2012   CAION CANCELLED BY LAB 01/03/2012   PHOS 2.2* 01/07/2012               Impression: 1)Renal  AKI secondary to Contrast Induced ATN With some contribution from SIRS as Pancreatitits  AKI most Likely Nonoliguric ATN  Creatinine is trending down  Non  Oliguiric ATN   2)HTN BP at goal  In SIRS state -will not aim for 120's  NOt on anti HTN meds  3)Anemia HGb not at goal (9--11)  Dilutional + Acute sickness+ CT  showed Retroperitoneal bleed  Primary following for need of Transfusion PRN  4)GI- Admitted with Pancreatitis          GI and Surgery on board.   5)Hypocalcium- Secondary to asso with Fats after Pancreatitis'                             Corrected calcium at goal  6)HypoNatremia - stable  7)Acid base  Co2 at goal   8) ID-  on IV Diflucan+ Primaxin + vanco    9) Prognosis- Guarded-    Plan:  Will continue current treatment. Will follow BMet       Keneshia Tena S 01/09/2012, 9:02 AM

## 2012-01-10 ENCOUNTER — Inpatient Hospital Stay (HOSPITAL_COMMUNITY): Payer: Self-pay

## 2012-01-10 DIAGNOSIS — R6510 Systemic inflammatory response syndrome (SIRS) of non-infectious origin without acute organ dysfunction: Secondary | ICD-10-CM

## 2012-01-10 DIAGNOSIS — K859 Acute pancreatitis without necrosis or infection, unspecified: Secondary | ICD-10-CM

## 2012-01-10 DIAGNOSIS — D649 Anemia, unspecified: Secondary | ICD-10-CM

## 2012-01-10 LAB — LIPASE, BLOOD: Lipase: 85 U/L — ABNORMAL HIGH (ref 11–59)

## 2012-01-10 LAB — CBC
Platelets: 289 10*3/uL (ref 150–400)
RBC: 2.82 MIL/uL — ABNORMAL LOW (ref 4.22–5.81)
RDW: 14.7 % (ref 11.5–15.5)
WBC: 10 10*3/uL (ref 4.0–10.5)

## 2012-01-10 LAB — COMPREHENSIVE METABOLIC PANEL
ALT: 26 U/L (ref 0–53)
AST: 69 U/L — ABNORMAL HIGH (ref 0–37)
Albumin: 1.9 g/dL — ABNORMAL LOW (ref 3.5–5.2)
CO2: 29 mEq/L (ref 19–32)
Chloride: 96 mEq/L (ref 96–112)
GFR calc non Af Amer: 46 mL/min — ABNORMAL LOW (ref >90)
Potassium: 3.9 mEq/L (ref 3.5–5.1)
Sodium: 135 mEq/L (ref 135–145)
Total Bilirubin: 0.5 mg/dL (ref 0.3–1.2)

## 2012-01-10 LAB — GLUCOSE, CAPILLARY
Glucose-Capillary: 116 mg/dL — ABNORMAL HIGH (ref 70–99)
Glucose-Capillary: 129 mg/dL — ABNORMAL HIGH (ref 70–99)

## 2012-01-10 LAB — OCCULT BLOOD X 1 CARD TO LAB, STOOL: Fecal Occult Bld: NEGATIVE

## 2012-01-10 LAB — PHOSPHORUS: Phosphorus: 2.6 mg/dL (ref 2.3–4.6)

## 2012-01-10 LAB — VANCOMYCIN, TROUGH: Vancomycin Tr: 16.2 ug/mL (ref 10.0–20.0)

## 2012-01-10 MED ORDER — INSULIN REGULAR HUMAN 100 UNIT/ML IJ SOLN
INTRAMUSCULAR | Status: DC
  Administered 2012-01-10: 19:00:00 via INTRAVENOUS
  Filled 2012-01-10: qty 2000

## 2012-01-10 NOTE — Progress Notes (Signed)
Subjective: Tolerating full liquids. Had a bowel movement last night. Nausea better. Pain about the same. No cough. No dysuria.   Objective: Vital signs in last 24 hours: Filed Vitals:   01/10/12 0400 01/10/12 0500 01/10/12 0729 01/10/12 0800  BP:      Pulse:      Temp: 102.6 F (39.2 C)   102.3 F (39.1 C)  TempSrc: Oral   Oral  Resp:      Height:      Weight:  119.6 kg (263 lb 10.7 oz)    SpO2:   96%    Weight change: -0.5 kg (-1 lb 1.6 oz)  Intake/Output Summary (Last 24 hours) at 01/10/12 0837 Last data filed at 01/10/12 0300  Gross per 24 hour  Intake 4017.5 ml  Output   3002 ml  Net 1015.5 ml   Physical Exam:  Lungs clear to auscultation bilaterally without wheeze rhonchi or rales Cardiovascular regular rate rhythm without murmurs gallops rubs Abdomen:  distended. Bowel sounds quiet. Extremities: Sequential compression devices, 1+ edema  Lab Results: Basic Metabolic Panel:  Lab 01/10/12 0865 01/09/12 0444 01/07/12 0503  NA 135 133* --  K 3.9 3.9 --  CL 96 97 --  CO2 29 30 --  GLUCOSE 125* 137* --  BUN 22 24* --  CREATININE 1.75* 1.89* --  CALCIUM 8.4 8.2* --  MG 2.1 -- 2.3  PHOS 2.6 -- 2.2*   Liver Function Tests:  Lab 01/10/12 0510 01/09/12 0444  AST 69* 61*  ALT 26 23  ALKPHOS 63 74  BILITOT 0.5 0.5  PROT 6.3 6.2  ALBUMIN 1.9* 2.0*    Lab 01/10/12 0510 01/09/12 0444  LIPASE 85* 91*  AMYLASE -- --   No results found for this basename: AMMONIA:2 in the last 168 hours CBC:  Lab 01/10/12 0510 01/09/12 0444 01/07/12 0503 01/05/12 0516  WBC 10.0 9.4 -- --  NEUTROABS -- -- 11.7* 8.6*  HGB 7.9* 7.8* -- --  HCT 24.4* 23.8* -- --  MCV 86.5 86.5 -- --  PLT 289 280 -- --   Cardiac Enzymes:  Lab 01/08/12 0420 01/07/12 0503 01/06/12 0849  CKTOTAL 1244* 1224* 1537*  CKMB -- -- --  CKMBINDEX -- -- --  TROPONINI -- -- --   BNP: No results found for this basename: PROBNP:3 in the last 168 hours D-Dimer: No results found for this basename:  DDIMER:2 in the last 168 hours CBG:  Lab 01/10/12 0741 01/10/12 0421 01/10/12 0006 01/09/12 1940 01/09/12 1632 01/09/12 1144  GLUCAP 99 129* 116* 134* 109* 117*   Hemoglobin A1C: No results found for this basename: HGBA1C in the last 168 hours Fasting Lipid Panel:  Lab 01/07/12 0504  CHOL 149  HDL --  LDLCALC --  TRIG 203*  CHOLHDL --  LDLDIRECT --   Thyroid Function Tests: No results found for this basename: TSH,T4TOTAL,FREET4,T3FREE,THYROIDAB in the last 168 hours Coagulation: No results found for this basename: LABPROT:4,INR:4 in the last 168 hours Anemia Panel:  Lab 01/03/12 1456  VITAMINB12 1188*  FOLATE 15.3  FERRITIN 2243*  TIBC 165*  IRON 25*  RETICCTPCT 2.3   Urine Drug Screen: Drugs of Abuse     Component Value Date/Time   LABOPIA POSITIVE* 12/30/2011 1433   COCAINSCRNUR NONE DETECTED 12/30/2011 1433   LABBENZ NONE DETECTED 12/30/2011 1433   AMPHETMU NONE DETECTED 12/30/2011 1433   THCU NONE DETECTED 12/30/2011 1433   LABBARB NONE DETECTED 12/30/2011 1433    Alcohol Level: No results found for this  basename: ETH:2 in the last 168 hours Urinalysis:  Lab 01/09/12 0834  COLORURINE YELLOW  LABSPEC 1.020  PHURINE 6.0  GLUCOSEU NEGATIVE  HGBUR LARGE*  BILIRUBINUR NEGATIVE  KETONESUR NEGATIVE  PROTEINUR 30*  UROBILINOGEN 0.2  NITRITE NEGATIVE  LEUKOCYTESUR NEGATIVE   Pro calcitonin 1.36  Micro Results: Recent Results (from the past 240 hour(s))  CULTURE, BLOOD (ROUTINE X 2)     Status: Normal (Preliminary result)   Collection Time   01/05/12  8:04 AM      Component Value Range Status Comment   Specimen Description Blood PICC LINE   Final    Special Requests     Final    Value: BOTTLES DRAWN AEROBIC AND ANAEROBIC 6CC DRAWN BY RN   Culture NO GROWTH 3 DAYS   Final    Report Status PENDING   Incomplete   CULTURE, BLOOD (ROUTINE X 2)     Status: Normal (Preliminary result)   Collection Time   01/05/12  8:12 AM      Component Value Range Status  Comment   Specimen Description Blood LEFT ANTECUBITAL   Final    Special Requests BOTTLES DRAWN AEROBIC AND ANAEROBIC 7CC   Final    Culture NO GROWTH 3 DAYS   Final    Report Status PENDING   Incomplete   CULTURE, BLOOD (ROUTINE X 2)     Status: Normal (Preliminary result)   Collection Time   01/07/12  8:25 PM      Component Value Range Status Comment   Specimen Description LEFT ANTECUBITAL   Final    Special Requests BOTTLES DRAWN AEROBIC AND ANAEROBIC 6CC   Final    Culture NO GROWTH 1 DAY   Final    Report Status PENDING   Incomplete   CULTURE, BLOOD (ROUTINE X 2)     Status: Normal (Preliminary result)   Collection Time   01/07/12  8:26 PM      Component Value Range Status Comment   Specimen Description LEFT ANTECUBITAL   Final    Special Requests BOTTLES DRAWN AEROBIC AND ANAEROBIC 6CC   Final    Culture NO GROWTH 1 DAY   Final    Report Status PENDING   Incomplete   URINE CULTURE     Status: Normal   Collection Time   01/08/12  1:53 PM      Component Value Range Status Comment   Specimen Description URINE, CLEAN CATCH   Final    Special Requests NONE   Final    Culture  Setup Time [id number]   Final    Colony Count NO GROWTH   Final    Culture NO GROWTH   Final    Report Status 01/09/2012 FINAL   Final   CLOSTRIDIUM DIFFICILE BY PCR     Status: Normal   Collection Time   01/08/12  7:02 PM      Component Value Range Status Comment   C difficile by pcr NEGATIVE  NEGATIVE  Final    Studies/Results: Dg Chest Port 1 View  01/09/2012  *RADIOLOGY REPORT*  Clinical Data: PICC line  PORTABLE CHEST - 1 VIEW  Comparison: 01/03/2012  Findings: Tip of the right PICC line is at the cavoatrial junction. Low volumes.  Moderate cardiomegaly.  Bibasilar atelectasis. Bilateral pleural effusions.  IMPRESSION: PICC tip at cavoatrial junction  Bilateral pleural effusions and bibasilar atelectasis.  Original Report Authenticated By: Donavan Burnet, M.D.   Scheduled Meds:    . fluconazole  (DIFLUCAN)  IV  200 mg Intravenous Q24H  . furosemide  60 mg Intravenous BID  . imipenem-cilastatin  500 mg Intravenous Q8H  . insulin aspart  0-20 Units Subcutaneous Q4H  . levalbuterol  0.63 mg Nebulization Q6H  . senna-docusate  2 tablet Oral QHS  . sodium chloride  10-40 mL Intracatheter Q12H  . thiamine  100 mg Oral Daily  . vancomycin  1,250 mg Intravenous Q12H  . DISCONTD: pantoprazole (PROTONIX) IV  40 mg Intravenous BID  . DISCONTD: polyethylene glycol  17 g Oral Daily   Continuous Infusions:    . TPN (CLINIMIX) +/- additives 80 mL/hr at 01/08/12 1830  . TPN (CLINIMIX) +/- additives 80 mL/hr at 01/09/12 2000  . DISCONTD: sodium chloride 75 mL/hr at 01/09/12 2000   PRN Meds:.acetaminophen, albuterol, bisacodyl, HYDROmorphone, LORazepam, ondansetron (ZOFRAN) IV, phenol, polyethylene glycol, sodium chloride Assessment/Plan:   *Acute pancreatitis, severe. Continue TPN. No current indication for surgery. Tolerating full liquids. Having bowel movements.  Fever: C. difficile negative. Repeat urinalysis improved. Urine culture and repeat blood cultures negative to date. Pro calcitonin much improved from last week. Will rescan his pancreas to evaluate for abscess (oral contrast only). If negative, then Doppler legs. If negative, fever could be drug fever versus pancreatitis-related fever   ARF (acute renal failure), continues to improve. Nephrology has signed off.  Alcohol abuse   Rhabdomyolysis, resolving   HTN (hypertension): normotensive off medications   Hepatic steatosis   Anemia due to dilution, critical illness, pancreatitis, retroperitoneal bleed. No evidence of GI bleed. Continue to monitor. Has largely stabilized   Hypoalbuminemia   Peripheral edema  Retroperitoneal bleed    LOS: 12 days   Clora Ohmer L 01/10/2012, 8:37 AM

## 2012-01-10 NOTE — Progress Notes (Signed)
UR Chart Review Completed  

## 2012-01-10 NOTE — Progress Notes (Signed)
Subjective:  Patient complains of ongoing abdominal pain/tightness. Hard to take deep breath. Two large BMs, nonbloody yesterday. Heme negative as well.   Objective: Vital signs in last 24 hours: Temp:  [99.4 F (37.4 C)-102.6 F (39.2 C)] 102.6 F (39.2 C) (04/04 0400) Pulse Rate:  [106-126] 121  (04/03 2100) Resp:  [14-33] 20  (04/03 2100) BP: (113-156)/(72-97) 142/89 mmHg (04/03 2100) SpO2:  [94 %-100 %] 96 % (04/04 0729) Weight:  [263 lb 10.7 oz (119.6 kg)] 263 lb 10.7 oz (119.6 kg) (04/04 0500) Last BM Date: 01/09/12 General:   Alert, pleasant and cooperative. Girlfriend at bedside. NAD Head:  Normocephalic and atraumatic. Eyes:  Sclera clear, no icterus.  Chest: CTA bilaterally decreased BS in bases.    Heart:  Regular rhythm, rate 120s.; no murmurs, clicks, rubs,  or gallops. Abdomen:  Soft, tight, distended. Tympanic bowel sounds. Diffuse tenderness.   Extremities:  Without clubbing, deformity. 2-3+ PE pedal bilaterally. SCDs in place.  Neurologic:  Alert and  oriented x4;  grossly normal neurologically. Skin:  Intact without significant lesions or rashes. Psych:  Alert and cooperative. Normal mood and affect.  Intake/Output from previous day: 04/03 0701 - 04/04 0700 In: 4257.5 [P.O.:720; I.V.:1067.5; IV Piggyback:1350; TPN:1120] Out: 3202 [Urine:3200; Stool:2] Intake/Output this shift:    Lab Results: CBC  Basename 01/10/12 0510 01/09/12 0444 01/08/12 1030  WBC 10.0 9.4 11.2*  HGB 7.9* 7.8* 8.7*  HCT 24.4* 23.8* 25.8*  MCV 86.5 86.5 86.3  PLT 289 280 271   BMET  Basename 01/10/12 0510 01/09/12 0444 01/08/12 0420  NA 135 133* 134*  K 3.9 3.9 3.9  CL 96 97 96  CO2 29 30 29   GLUCOSE 125* 137* 141*  BUN 22 24* 23  CREATININE 1.75* 1.89* 1.93*  CALCIUM 8.4 8.2* 8.4   LFTs  Basename 01/10/12 0510 01/09/12 0444  BILITOT 0.5 0.5  BILIDIR -- --  IBILI -- --  ALKPHOS 63 74  AST 69* 61*  ALT 26 23  PROT 6.3 6.2  ALBUMIN 1.9* 2.0*    Basename 01/10/12  0510 01/09/12 0444 01/08/12 0420  LIPASE 85* 91* 108*   Heme negative X 2 CDiff PCR: neg Urine culture: neg Blood culture: prelim neg  Lab Results  Component Value Date   IRON 25* 01/03/2012   TIBC 165* 01/03/2012   FERRITIN 2243* 01/03/2012   Lab Results  Component Value Date   VITAMINB12 1188* 01/03/2012   Lab Results  Component Value Date   FOLATE 15.3 01/03/2012   Imaging Studies: Ct Abdomen Pelvis Wo Contrast  01/03/2012  *RADIOLOGY REPORT*  Clinical Data: Abdominal pain, nausea and vomiting.  Upper and lower back pain.  Shortness of breath.  Chest pressure.  Renal insufficiency.  CT ABDOMEN AND PELVIS WITHOUT CONTRAST  Technique:  Multidetector CT imaging of the abdomen and pelvis was performed following the standard protocol without intravenous contrast.  Comparison: Previous examinations, including the abdomen radiographs obtained earlier today and abdomen and pelvis CT dated 12/29/2011.  Findings: The examination is limited by the lack of intravenous contrast as well as streak artifacts produced by the patient's right arm.  There has been interval further ill defined increased enlargement of the entire pancreas.  There is wall thickening involving the adjacent portions of the duodenum with significant luminal narrowing in the second portion of the duodenum.  A nasogastric tube is in place with its tip in the mid to distal stomach.  Increased peripancreatic soft tissue stranding and ill-defined fluid, extending inferiorly  in the anterior pararenal space as well as interval fluid and stranding in the posterior pararenal space. The perinephric spaces are preserved.  Interval small bilateral pleural effusions and bilateral lower lobe atelectasis.  Interval dilatation of multiple small bowel loops and transverse colon.  Interval small amount of air in the urinary bladder, suggesting recent catheterization.  Interval diffuse subcutaneous edema as well as a multiple small foci of subcutaneous  air on the right, compatible with recent subcutaneous injections.  Interval ill-defined soft tissue and fluid density extending into the left upper abdomen below the hemidiaphragm.  Lumbar and lower thoracic spine degenerative changes.  IMPRESSION:  1.  Limited examination due to the lack of intravenous contrast and streak artifacts, as described above. 2.  Significant progression of marked changes of acute pancreatitis with no discrete, defined fluid collections seen at this time. 3.  Interval small bilateral pleural effusions and bilateral lower lobe atelectasis. 4.  Interval small bowel and transverse colon ileus.  Original Report Authenticated By: Darrol Angel, M.D.     Ct Abdomen Pelvis W Contrast  12/29/2011  *RADIOLOGY REPORT*  Clinical Data: Abdominal pain, elevated LFTs and lipase, no history of pancreatitis  CT ABDOMEN AND PELVIS WITH CONTRAST  Technique:  Multidetector CT imaging of the abdomen and pelvis was performed following the standard protocol during bolus administration of intravenous contrast.  Contrast:  80 ml Omnipaque-300  Comparison: CT abdomen pelvis - 04/26/2007  Findings:  Normal hepatic contour.  There is diffuse decreased attenuation of the hepatic parenchyma on this postcontrast examination, there is suggestion of hepatic steatosis.  No discrete hepatic lesions. Normal gallbladder.  No intra or extrahepatic biliary ductal dilatation.  There is symmetric enhancement and screw through the bilateral kidneys.  No discrete renal lesions.  No urinary obstruction.  The bilateral adrenal glands are normal.  Normal pancreas.  Their pancreas is thickened with associated extensive stranding and fluid about the pancreas.  There is no definitive evidence of pancreatic ductal dilatation.  The pancreas enhances normally.  No drainable peripancreatic fluid collection or abscess.  Scattered colonic diverticulosis without evidence of diverticulitis.  Enteric contrast extends to the distal small  bowel.  No evidence of enteric obstruction.  Normal appendix.  No pneumoperitoneum, pneumatosis or portal venous gas.  Normal caliber of the abdominal aorta.  The major branch vessels of the abdominal aorta, including the IMA, are patent.  The splenic artery and vein are patent.  Limited visualization of the lung bases demonstrates bibasilar ground-glass opacities favored to represent atelectasis.  No pneumothorax.  Normal heart size.  No pericardial effusion. Minimal calcifications within the aortic valve annulus.  No acute or aggressive osseous abnormalities.  Minimal degenerative change of L5 - S1.  IMPRESSION:  1.  Findings compatible with acute pancreatitis.  No definitive areas of pancreatic necrosis.  No drainable peripancreatic fluid collection or abscess.  2. Hepatic steatosis.  No intra or extrahepatic biliary ductal dilatation.  Original Report Authenticated By: Waynard Reeds, M.D.      Assessment: 1.Acute pancreatitis, secondary to etoh +/- hypertriglyceridemia w/SIRS. Complicated by ?retroperitoneal bleed, SB ileus/pSBO. Continues to have tachycardia, fever. Patient remains on diflucan, vancomycin, primaxin. Discussed with Dr. Lendell Caprice. Agree with plans for CT A/P today. 2. Alcohol abuse 3. Hepatic steatosis 4. Hypertriglyceridemia 5. Rhabdomyolysis, resolving.  Plan: 1. Agree with noncontrast CT A/P today. Will f/u results as available.  2. Continue antibiotics for now.  3. Continue supportive measures.  4. May need to back off on diet,  patient c/o pp abdominal pain.    LOS: 12 days   Tana Coast  01/10/2012, 7:59 AM

## 2012-01-10 NOTE — Progress Notes (Signed)
CARE MANAGEMENT NOTE 01/10/2012  Patient:  Peter [name]   Account Number:  [id number]  Date Initiated:  01/03/2012  Documentation initiated by:  Rosemary Holms  Subjective/Objective Assessment:   pt admitted with abdominal pain, elevated lipase,  ETOH abuse dehydration.     Action/Plan:   Will continue to follow.   Anticipated DC Date:  01/14/2012   Anticipated DC Plan:  HOME W HOME HEALTH SERVICES  In-house referral  Financial Counselor      DC Planning Services  CM consult      Choice offered to / List presented to:             Status of service:  In process, will continue to follow Medicare Important Message given?   (If response is "NO", the following Medicare IM given date fields will be blank) Date Medicare IM given:   Date Additional Medicare IM given:    Discharge Disposition:    Per UR Regulation:    If discussed at Long Length of Stay Meetings, dates discussed:   01/03/2012  01/10/2012    Comments:  01/10/12 1100 Zamir Staples Leanord Hawking RN BSN CM Still following for DC [facility] DME needs  01/03/12 1000 Falynn Ailey Leanord Hawking RN BSN CM

## 2012-01-10 NOTE — Consult Note (Signed)
PARENTERAL NUTRITION CONSULT NOTE   Pharmacy Consult for TPN Indication: severe pancreatitis  Allergies  Allergen Reactions  . Ace Inhibitors Swelling    Lip swelling   Patient Measurements: Height: 5\' 10"  (177.8 cm) Weight: 263 lb 10.7 oz (119.6 kg) IBW/kg (Calculated) : 73  Adjusted Body Weight: 87KG  Vital Signs: Temp: 102.3 F (39.1 C) (04/04 0800) Temp src: Oral (04/04 0800) Intake/Output from previous day: 04/03 0701 - 04/04 0700 In: 4257.5 [P.O.:720; I.V.:1067.5; IV Piggyback:1350; TPN:1120] Out: 3202 [Urine:3200; Stool:2] Intake/Output from this shift: Total I/O In: 360 [P.O.:360] Out: 1150 [Urine:1150]  Labs:  [facility] 01/10/12 0510 01/09/12 0444 01/08/12 1030  WBC 10.0 9.4 11.2*  HGB 7.9* 7.8* 8.7*  HCT 24.4* 23.8* 25.8*  PLT 289 280 271  APTT -- -- --  INR -- -- --    Basename 01/10/12 0510 01/09/12 0444 01/08/12 0420  NA 135 133* 134*  K 3.9 3.9 3.9  CL 96 97 96  CO2 29 30 29   GLUCOSE 125* 137* 141*  BUN 22 24* 23  CREATININE 1.75* 1.89* 1.93*  LABCREA -- -- --  CREAT24HRUR -- -- --  CALCIUM 8.4 8.2* 8.4  MG 2.1 -- --  PHOS 2.6 -- --  PROT 6.3 6.2 --  ALBUMIN 1.9* 2.0* --  AST 69* 61* --  ALT 26 23 --  ALKPHOS 63 74 --  BILITOT 0.5 0.5 --  BILIDIR -- -- --  IBILI -- -- --  PREALBUMIN -- -- --  TRIG -- -- --  CHOLHDL -- -- --  CHOL -- -- --   Estimated Creatinine Clearance: 70.5 ml/min (by C-G formula based on Cr of 1.75).    Basename 01/10/12 0741 01/10/12 0421 01/10/12 0006  GLUCAP 99 129* 116*   Medical History: Past Medical History  Diagnosis Date  . Hypertension   . Ileus 01/03/2012  . Retroperitoneal bleed 01/04/2012   Medications:  Scheduled:     . fluconazole (DIFLUCAN) IV  200 mg Intravenous Q24H  . furosemide  60 mg Intravenous BID  . imipenem-cilastatin  500 mg Intravenous Q8H  . levalbuterol  0.63 mg Nebulization Q6H  . senna-docusate  2 tablet Oral QHS  . sodium chloride  10-40 mL Intracatheter Q12H  .  thiamine  100 mg Oral Daily  . vancomycin  1,250 mg Intravenous Q12H  . DISCONTD: insulin aspart  0-20 Units Subcutaneous Q4H  . DISCONTD: pantoprazole (PROTONIX) IV  40 mg Intravenous BID  . DISCONTD: polyethylene glycol  17 g Oral Daily   Insulin Requirements in the past 24 hours:  SSI + insulin in TPN (~ 9 units given)  Current Nutrition:  TPN at 75ml/hr  Assessment: Tolerating full liquids Renal failure improving Obesity Severe pancreatitis Hypoalbuminemia, prealbumin remains low, no improvement E-lytes replenished Hyperglycemia due to TPN, improving, adjusting insulin  Nutritional Goals:  2000-2100 kCal, 100-120 grams of protein per day  Plan: continue TPN at 65ml/hr today (Clinimix E 5/15) Full liquid diet per MD, pt tolerating reportedly NO LIPID (FAT) INFUSION per Dr Darrick Penna (d/w MD) continue regular insulin 30 units/Liter (60 units per bag) Continue IVF (saline), rate per MD MVI and Trace Elements every Mon-Wed-Fri (shortage) F/U Lytes, fluid status, renal fxn, glucose tolerance tomorrow  Margo Aye, Tora Prunty A 01/10/2012,11:30 AM

## 2012-01-10 NOTE — Progress Notes (Signed)
Subjective: Interval History: has no complaint of nausea or vomiting. Patient denies any difficulty in breathing.  Objective: Vital signs in last 24 hours: Temp:  [99.4 F (37.4 C)-102.6 F (39.2 C)] 102.6 F (39.2 C) (04/04 0400) Pulse Rate:  [106-126] 121  (04/03 2100) Resp:  [14-33] 20  (04/03 2100) BP: (113-156)/(72-97) 142/89 mmHg (04/03 2100) SpO2:  [94 %-100 %] 96 % (04/04 0729) Weight:  [119.6 kg (263 lb 10.7 oz)] 119.6 kg (263 lb 10.7 oz) (04/04 0500) Weight change: -0.5 kg (-1 lb 1.6 oz)  Intake/Output from previous day: 04/03 0701 - 04/04 0700 In: 4257.5 [P.O.:720; I.V.:1067.5; IV Piggyback:1350; TPN:1120] Out: 3202 [Urine:3200; Stool:2] Intake/Output this shift:    General appearance: alert, cooperative and no distress Resp: clear to auscultation bilaterally Cardio: regular rate and rhythm, S1, S2 normal, no murmur, click, rub or gallop GI: Abdomen and distended, tympanic but nontender. He has positive bowel sound. Extremities: edema 1+ edema bilaterally.  Lab Results:  Basename 01/10/12 0510 01/09/12 0444  WBC 10.0 9.4  HGB 7.9* 7.8*  HCT 24.4* 23.8*  PLT 289 280   BMET:  Basename 01/10/12 0510 01/09/12 0444  NA 135 133*  K 3.9 3.9  CL 96 97  CO2 29 30  GLUCOSE 125* 137*  BUN 22 24*  CREATININE 1.75* 1.89*  CALCIUM 8.4 8.2*   No results found for this basename: PTH:2 in the last 72 hours Iron Studies: No results found for this basename: IRON,TIBC,TRANSFERRIN,FERRITIN in the last 72 hours  Studies/Results: Dg Chest Port 1 View  01/09/2012  *RADIOLOGY REPORT*  Clinical Data: PICC line  PORTABLE CHEST - 1 VIEW  Comparison: 01/03/2012  Findings: Tip of the right PICC line is at the cavoatrial junction. Low volumes.  Moderate cardiomegaly.  Bibasilar atelectasis. Bilateral pleural effusions.  IMPRESSION: PICC tip at cavoatrial junction  Bilateral pleural effusions and bibasilar atelectasis.  Original Report Authenticated By: Donavan Burnet, M.D.    I  have reviewed the patient's current medications.  Assessment/Plan: Problem #1 renal failure acute kidney injury is BUN is 22 creatinine is 1.75 progressively improving. Presently her patient is none oliguric. Problem #2 anemia his hemoglobin is 7.9 hematocrit 24.4 stable Problem #3 pancreatitis seems to be improving Problem #4 history of hypertension his blood pressure seems to be controlled very well Problem #5 history of ileus Problem #6 history of metabolic acidosis have recurred Problem #7 hepatic steatosis Plan: Presently will continue his present management.           Since his renal function has improved at this moment contribution to be very limited hence I will sign Off.    LOS: 12 days   Peter [name] S 01/10/2012,7:54 AM

## 2012-01-10 NOTE — Progress Notes (Addendum)
REVIEWED.  Reviewed ct with dr bole: ct shows DUODENUM EDEMATOUS, UPPER SMALL BOWEL DILATED/FECALIZED LOWER LOBE CONSOLIDATION, PANCREATIC TISSUE UNCHANGED, RP BLEED UNCHANGED, MORE ASCITES.

## 2012-01-10 NOTE — Consult Note (Signed)
ANTIBIOTIC CONSULT NOTE   Pharmacy Consult for Vancomycin, Diflucan added 4/1 Indication: rule out sepsis, fevers, central line, TPN  Allergies  Allergen Reactions  . Ace Inhibitors Swelling    Lip swelling   Patient Measurements: Height: 5\' 10"  (177.8 cm) Weight: 263 lb 10.7 oz (119.6 kg) IBW/kg (Calculated) : 73   Vital Signs: Temp: 102.3 F (39.1 C) (04/04 0800) Temp src: Oral (04/04 0800) Intake/Output from previous day: 04/03 0701 - 04/04 0700 In: 4257.5 [P.O.:720; I.V.:1067.5; IV Piggyback:1350; TPN:1120] Out: 3202 [Urine:3200; Stool:2] Intake/Output from this shift: Total I/O In: 360 [P.O.:360] Out: 750 [Urine:750]  Labs:  [facility] 01/10/12 0510 01/09/12 0444 01/08/12 1030 01/08/12 0420  WBC 10.0 9.4 11.2* --  HGB 7.9* 7.8* 8.7* --  PLT 289 280 271 --  LABCREA -- -- -- --  CREATININE 1.75* 1.89* -- 1.93*   Estimated Creatinine Clearance: 70.5 ml/min (by C-G formula based on Cr of 1.75).  Basename 01/10/12 0809  VANCOTROUGH 16.2  VANCOPEAK --  VANCORANDOM --  GENTTROUGH --  GENTPEAK --  GENTRANDOM --  TOBRATROUGH --  TOBRAPEAK --  TOBRARND --  AMIKACINPEAK --  AMIKACINTROU --  AMIKACIN --    Microbiology: Recent Results (from the past 720 hour(s))  CULTURE, BLOOD (ROUTINE X 2)     Status: Normal   Collection Time   12/30/11  1:45 PM      Component Value Range Status Comment   Specimen Description BLOOD LEFT HAND   Final    Special Requests BOTTLES DRAWN AEROBIC ONLY 4CC ONE BOTTLE   Final    Culture NO GROWTH 9 DAYS   Final    Report Status 01/08/2012 FINAL   Final   MRSA PCR SCREENING     Status: Normal   Collection Time   12/30/11  1:49 PM      Component Value Range Status Comment   MRSA by PCR NEGATIVE  NEGATIVE  Final   CULTURE, BLOOD (ROUTINE X 2)     Status: Normal   Collection Time   12/30/11  2:00 PM      Component Value Range Status Comment   Specimen Description BLOOD LEFT ARM   Final    Special Requests     Final    Value:  BOTTLES DRAWN AEROBIC AND ANAEROBIC 4CC EACH BOTTLE   Culture NO GROWTH 9 DAYS   Final    Report Status 01/08/2012 FINAL   Final   URINE CULTURE     Status: Normal   Collection Time   12/30/11  3:10 PM      Component Value Range Status Comment   Specimen Description URINE, CATHETERIZED   Final    Special Requests NONE   Final    Culture  Setup Time [id number]   Final    Colony Count NO GROWTH   Final    Culture NO GROWTH   Final    Report Status 12/31/2011 FINAL   Final   CULTURE, BLOOD (ROUTINE X 2)     Status: Normal (Preliminary result)   Collection Time   01/05/12  8:04 AM      Component Value Range Status Comment   Specimen Description Blood PICC LINE   Final    Special Requests     Final    Value: BOTTLES DRAWN AEROBIC AND ANAEROBIC 6CC DRAWN BY RN   Culture NO GROWTH 3 DAYS   Final    Report Status PENDING   Incomplete   CULTURE, BLOOD (ROUTINE X 2)  Status: Normal (Preliminary result)   Collection Time   01/05/12  8:12 AM      Component Value Range Status Comment   Specimen Description Blood LEFT ANTECUBITAL   Final    Special Requests BOTTLES DRAWN AEROBIC AND ANAEROBIC 7CC   Final    Culture NO GROWTH 3 DAYS   Final    Report Status PENDING   Incomplete   CULTURE, BLOOD (ROUTINE X 2)     Status: Normal (Preliminary result)   Collection Time   01/07/12  8:25 PM      Component Value Range Status Comment   Specimen Description LEFT ANTECUBITAL   Final    Special Requests BOTTLES DRAWN AEROBIC AND ANAEROBIC 6CC   Final    Culture NO GROWTH 1 DAY   Final    Report Status PENDING   Incomplete   CULTURE, BLOOD (ROUTINE X 2)     Status: Normal (Preliminary result)   Collection Time   01/07/12  8:26 PM      Component Value Range Status Comment   Specimen Description LEFT ANTECUBITAL   Final    Special Requests BOTTLES DRAWN AEROBIC AND ANAEROBIC 6CC   Final    Culture NO GROWTH 1 DAY   Final    Report Status PENDING   Incomplete   URINE CULTURE     Status: Normal    Collection Time   01/08/12  1:53 PM      Component Value Range Status Comment   Specimen Description URINE, CLEAN CATCH   Final    Special Requests NONE   Final    Culture  Setup Time [id number]   Final    Colony Count NO GROWTH   Final    Culture NO GROWTH   Final    Report Status 01/09/2012 FINAL   Final   CLOSTRIDIUM DIFFICILE BY PCR     Status: Normal   Collection Time   01/08/12  7:02 PM      Component Value Range Status Comment   C difficile by pcr NEGATIVE  NEGATIVE  Final    Medical History: Past Medical History  Diagnosis Date  . Hypertension   . Ileus 01/03/2012  . Retroperitoneal bleed 01/04/2012   Medications:  Scheduled:     . fluconazole (DIFLUCAN) IV  200 mg Intravenous Q24H  . furosemide  60 mg Intravenous BID  . imipenem-cilastatin  500 mg Intravenous Q8H  . levalbuterol  0.63 mg Nebulization Q6H  . senna-docusate  2 tablet Oral QHS  . sodium chloride  10-40 mL Intracatheter Q12H  . thiamine  100 mg Oral Daily  . vancomycin  1,250 mg Intravenous Q12H  . DISCONTD: insulin aspart  0-20 Units Subcutaneous Q4H  . DISCONTD: pantoprazole (PROTONIX) IV  40 mg Intravenous BID  . DISCONTD: polyethylene glycol  17 g Oral Daily   Assessment: SCr improving (ClCr > 60) Renal fxn improving Vancomycin trough level on target  Goal of Therapy:  Vancomycin trough level 15-20 mcg/ml  Plan: Continue Vancomycin 1250mg  iv q12hrs Diflucan 200mg  iv q24hrs Labs per protocol  Valrie Hart A 01/10/2012,11:28 AM

## 2012-01-10 NOTE — Progress Notes (Signed)
Subjective: Pain somewhat increased.  No nausea.  No fever.  Normal BM.  Objective: Vital signs in last 24 hours: Temp:  [102.2 F (39 C)-103 F (39.4 C)] 103 F (39.4 C) (04/04 2003) SpO2:  [95 %-96 %] 96 % (04/04 2003) Weight:  [119.6 kg (263 lb 10.7 oz)] 119.6 kg (263 lb 10.7 oz) (04/04 0500) Last BM Date: 01/10/12  Intake/Output from previous day: 04/03 0701 - 04/04 0700 In: 4257.5 [P.O.:720; I.V.:1067.5; IV Piggyback:1350; TPN:1120] Out: 3202 [Urine:3200; Stool:2] Intake/Output this shift:    General appearance: alert and mild distress GI: Soft, quiet, distended.  Moderate-severe pain.  No diffuse peritoneal signs.  Lab Results:   Basename 01/10/12 0510 01/09/12 0444  WBC 10.0 9.4  HGB 7.9* 7.8*  HCT 24.4* 23.8*  PLT 289 280   BMET  Basename 01/10/12 0510 01/09/12 0444  NA 135 133*  K 3.9 3.9  CL 96 97  CO2 29 30  GLUCOSE 125* 137*  BUN 22 24*  CREATININE 1.75* 1.89*  CALCIUM 8.4 8.2*   PT/INR No results found for this basename: LABPROT:2,INR:2 in the last 72 hours ABG No results found for this basename: PHART:2,PCO2:2,PO2:2,HCO3:2 in the last 72 hours  Studies/Results: Ct Abdomen Pelvis Wo Contrast  01/10/2012  *RADIOLOGY REPORT*  Clinical Data: Pancreatitis, retroperitoneal hemorrhage, elevated creatinine  CT ABDOMEN AND PELVIS WITHOUT CONTRAST  Technique:  Multidetector CT imaging of the abdomen and pelvis was performed following the standard protocol without intravenous contrast.  Comparison: 01/03/2012  Findings: Small bilateral pleural effusions with adjacent atelectasis/consolidation in basilar segments of visualized lower lobes.  There is a small amount of perihepatic and perisplenic ascites with some increase in the amount of fluid in the pericolic gutters, left greater than right.  Mixed attenuation peripancreatic and retroperitoneal fluid has slightly increased, for example anterior to the interpolar region right kidney image 42 and posterolateral  to the left kidney at the level of the left renal vein image 44.  There is no pelvic ascites.  Stomach is nondilated. Duodenum remains thick walled.  Multiple dilated proximal and mid small bowel loops with fluid levels throughout the abdomen.  There are a few decompressed small bowel loops distally.  The colon is nondilated.  Urinary bladder is distended.  Bilateral pelvic phleboliths.  No pelvic or retroperitoneal adenopathy localized.  IMPRESSION:  1.  Some interval increase in peripancreatic and  retroperitoneal mixed attenuation fluid. 2. Persistent dilated proximal and mid small bowel  Original Report Authenticated By: Osa Craver, M.D.   Dg Chest Port 1 View  01/09/2012  *RADIOLOGY REPORT*  Clinical Data: PICC line  PORTABLE CHEST - 1 VIEW  Comparison: 01/03/2012  Findings: Tip of the right PICC line is at the cavoatrial junction. Low volumes.  Moderate cardiomegaly.  Bibasilar atelectasis. Bilateral pleural effusions.  IMPRESSION: PICC tip at cavoatrial junction  Bilateral pleural effusions and bibasilar atelectasis.  Original Report Authenticated By: Donavan Burnet, M.D.    Anti-infectives: Anti-infectives     Start     Dose/Rate Route Frequency Ordered Stop   01/07/12 2100   vancomycin (VANCOCIN) 1,250 mg in sodium chloride 0.9 % 250 mL IVPB        1,250 mg 166.7 mL/hr over 90 Minutes Intravenous Every 12 hours 01/07/12 1011     01/07/12 0900   fluconazole (DIFLUCAN) IVPB 200 mg        200 mg 100 mL/hr over 60 Minutes Intravenous Every 24 hours 01/07/12 0740     01/05/12 0900  vancomycin (VANCOCIN) IVPB 1000 mg/200 mL premix  Status:  Discontinued        1,000 mg 200 mL/hr over 60 Minutes Intravenous Every 12 hours 01/05/12 0737 01/07/12 1011   01/02/12 1400   imipenem-cilastatin (PRIMAXIN) 500 mg in sodium chloride 0.9 % 100 mL IVPB        500 mg 200 mL/hr over 30 Minutes Intravenous 3 times per day 01/02/12 0838     12/30/11 1500   imipenem-cilastatin (PRIMAXIN) 250 mg  in sodium chloride 0.9 % 100 mL IVPB  Status:  Discontinued        250 mg 200 mL/hr over 30 Minutes Intravenous 4 times per day 12/30/11 1321 01/02/12 0838   12/30/11 1400   imipenem-cilastatin (PRIMAXIN) 500 mg in sodium chloride 0.9 % 100 mL IVPB  Status:  Discontinued        500 mg 200 mL/hr over 30 Minutes Intravenous 3 times per day 12/30/11 1314 12/30/11 1321          Assessment/Plan: s/p * No surgery found * Acute pancreatitis.  CT reviewed, increased peripancreatic fluid.  Remains guarded.    LOS: 12 days    Dravin Lance C 01/10/2012

## 2012-01-11 ENCOUNTER — Inpatient Hospital Stay (HOSPITAL_COMMUNITY): Payer: Self-pay

## 2012-01-11 LAB — CBC
Hemoglobin: 7.7 g/dL — ABNORMAL LOW (ref 13.0–17.0)
MCH: 28.2 pg (ref 26.0–34.0)
MCHC: 32.4 g/dL (ref 30.0–36.0)
MCV: 87.2 fL (ref 78.0–100.0)
RBC: 2.73 MIL/uL — ABNORMAL LOW (ref 4.22–5.81)

## 2012-01-11 LAB — CULTURE, BLOOD (ROUTINE X 2)
Culture: NO GROWTH
Culture: NO GROWTH

## 2012-01-11 MED ORDER — HYDROMORPHONE HCL PF 1 MG/ML IJ SOLN
1.0000 mg | Freq: Once | INTRAMUSCULAR | Status: AC
  Administered 2012-01-11: 1 mg via INTRAVENOUS

## 2012-01-11 MED ORDER — TRACE MINERALS CR-CU-MN-SE-ZN 10-1000-500-60 MCG/ML IV SOLN
INTRAVENOUS | Status: DC
  Filled 2012-01-11: qty 2000

## 2012-01-11 MED ORDER — SODIUM CHLORIDE 0.9 % IJ SOLN
INTRAMUSCULAR | Status: AC
  Filled 2012-01-11: qty 3

## 2012-01-11 NOTE — Progress Notes (Signed)
Report called to [facility] to [facility], Felix Ahmadi, Rn.  Pt out via [place] for transport.  No acute distress noted.  Family aware of patient transferring to [facility].

## 2012-01-11 NOTE — Progress Notes (Signed)
Subjective:  No new complaints. Ongoing abdominal pain.   Objective: Vital signs in last 24 hours: Temp:  [98.5 F (36.9 C)-103 F (39.4 C)] 98.5 F (36.9 C) (04/05 0400) Pulse Rate:  [113-132] 113  (04/05 0500) Resp:  [14-26] 14  (04/05 0500) BP: (101-156)/(60-97) 113/87 mmHg (04/05 0500) SpO2:  [94 %-97 %] 96 % (04/05 0714) Weight:  [260 lb 5.8 oz (118.1 kg)] 260 lb 5.8 oz (118.1 kg) (04/05 0500) Last BM Date: 01/10/12 General:   Alert,   pleasant and cooperative in NAD. Girlfriend at bedside. Head:  Normocephalic and atraumatic. Eyes:  Sclera clear, no icterus.  Chest: CTA bilaterally but decreased at bases.    Heart:  Regular rhythm, tachycardia; no murmurs, clicks, rubs,  or gallops. Abdomen:  Tight, distended. Diffuse tenderness with palpation. No bowel sounds.    Extremities:  Without clubbing, deformity. 2+ PE pedal bilaterally. Neurologic:  Alert and  oriented x4;  grossly normal neurologically. Skin:  Intact without significant lesions or rashes. Psych:  Alert and cooperative. Normal mood and affect.  Intake/Output from previous day: 04/04 0701 - 04/05 0700 In: 3966 [P.O.:360; I.V.:10; IV Piggyback:900; TPN:2696] Out: 2150 [Urine:2150] Intake/Output this shift:    Lab Results: CBC  Basename 01/11/12 0440 01/10/12 0510 01/09/12 0444  WBC 11.2* 10.0 9.4  HGB 7.7* 7.9* 7.8*  HCT 23.8* 24.4* 23.8*  MCV 87.2 86.5 86.5  PLT 310 289 280   BMET  Basename 01/10/12 0510 01/09/12 0444  NA 135 133*  K 3.9 3.9  CL 96 97  CO2 29 30  GLUCOSE 125* 137*  BUN 22 24*  CREATININE 1.75* 1.89*  CALCIUM 8.4 8.2*   LFTs  Basename 01/10/12 0510 01/09/12 0444  BILITOT 0.5 0.5  BILIDIR -- --  IBILI -- --  ALKPHOS 63 74  AST 69* 61*  ALT 26 23  PROT 6.3 6.2  ALBUMIN 1.9* 2.0*    Basename 01/10/12 0510 01/09/12 0444  LIPASE 85* 91*     Imaging Studies: Ct Abdomen Pelvis Wo Contrast  01/10/2012  *RADIOLOGY REPORT*  Clinical Data: Pancreatitis, retroperitoneal  hemorrhage, elevated creatinine  CT ABDOMEN AND PELVIS WITHOUT CONTRAST  Technique:  Multidetector CT imaging of the abdomen and pelvis was performed following the standard protocol without intravenous contrast.  Comparison: 01/03/2012  Findings: Small bilateral pleural effusions with adjacent atelectasis/consolidation in basilar segments of visualized lower lobes.  There is a small amount of perihepatic and perisplenic ascites with some increase in the amount of fluid in the pericolic gutters, left greater than right.  Mixed attenuation peripancreatic and retroperitoneal fluid has slightly increased, for example anterior to the interpolar region right kidney image 42 and posterolateral to the left kidney at the level of the left renal vein image 44.  There is no pelvic ascites.  Stomach is nondilated. Duodenum remains thick walled.  Multiple dilated proximal and mid small bowel loops with fluid levels throughout the abdomen.  There are a few decompressed small bowel loops distally.  The colon is nondilated.  Urinary bladder is distended.  Bilateral pelvic phleboliths.  No pelvic or retroperitoneal adenopathy localized.  IMPRESSION:  1.  Some interval increase in peripancreatic and  retroperitoneal mixed attenuation fluid. 2. Persistent dilated proximal and mid small bowel  Original Report Authenticated By: Osa Craver, M.D.   Ct Abdomen Pelvis Wo Contrast  01/03/2012  *RADIOLOGY REPORT*  Clinical Data: Abdominal pain, nausea and vomiting.  Upper and lower back pain.  Shortness of breath.  Chest pressure.  Renal insufficiency.  CT ABDOMEN AND PELVIS WITHOUT CONTRAST  Technique:  Multidetector CT imaging of the abdomen and pelvis was performed following the standard protocol without intravenous contrast.  Comparison: Previous examinations, including the abdomen radiographs obtained earlier today and abdomen and pelvis CT dated 12/29/2011.  Findings: The examination is limited by the lack of intravenous  contrast as well as streak artifacts produced by the patient's right arm.  There has been interval further ill defined increased enlargement of the entire pancreas.  There is wall thickening involving the adjacent portions of the duodenum with significant luminal narrowing in the second portion of the duodenum.  A nasogastric tube is in place with its tip in the mid to distal stomach.  Increased peripancreatic soft tissue stranding and ill-defined fluid, extending inferiorly in the anterior pararenal space as well as interval fluid and stranding in the posterior pararenal space. The perinephric spaces are preserved.  Interval small bilateral pleural effusions and bilateral lower lobe atelectasis.  Interval dilatation of multiple small bowel loops and transverse colon.  Interval small amount of air in the urinary bladder, suggesting recent catheterization.  Interval diffuse subcutaneous edema as well as a multiple small foci of subcutaneous air on the right, compatible with recent subcutaneous injections.  Interval ill-defined soft tissue and fluid density extending into the left upper abdomen below the hemidiaphragm.  Lumbar and lower thoracic spine degenerative changes.  IMPRESSION:  1.  Limited examination due to the lack of intravenous contrast and streak artifacts, as described above. 2.  Significant progression of marked changes of acute pancreatitis with no discrete, defined fluid collections seen at this time. 3.  Interval small bilateral pleural effusions and bilateral lower lobe atelectasis. 4.  Interval small bowel and transverse colon ileus.  Original Report Authenticated By: Darrol Angel, M.D.      Assessment: Acute pancreatitis, secondary to etoh +/- hypertriglyceridemia. Complicated by SIRS, retroperitoneal bleed, SB ileus. Continues to have tachycardia with fever. On Diflucan, vancomycin, primaxin. CT yesterday without contrast showed pancreatic tissue unchanged, retroperitoneal bleed unchanged,  increased ascites, edematous duodenum, upper small bowel dilation.   Persistent fevers. Per nursing, patient's heart rate nearly 200 at one point. Patient with tight abdomen and c/o diffuse pain. Creatinine improved.  Patient at risk for abdominal compartment syndrome.  Plan: 1. To discuss with Dr. Darrick Penna. ?transfer to tertiary care center?   LOS: 13 days   Tana Coast  01/11/2012, 8:18 AM   Discussed with Dr. Darrick Penna. Agrees with need for transfer.

## 2012-01-11 NOTE — Progress Notes (Signed)
Nutrition Follow-up  Pt to transfer to [facility] pt expects maybe on Mon. or Tues.   Diet Order: NPO with ice chips and sips of clears. Tolerating.  Patient is receiving TPN with Clinimix E 5/15 @ 80 ml/hr. No lipids.   Provides  1920 ml, 1363 kcal, and 96 grams protein per day. Meets 68% minimum estimated energy needs and 85% minimum estimated protein needs.   Meds: Scheduled Meds:   . fluconazole (DIFLUCAN) IV  200 mg Intravenous Q24H  . imipenem-cilastatin  500 mg Intravenous Q8H  . levalbuterol  0.63 mg Nebulization Q6H  . sodium chloride  10-40 mL Intracatheter Q12H  . thiamine  100 mg Oral Daily  . vancomycin  1,250 mg Intravenous Q12H  . DISCONTD: furosemide  60 mg Intravenous BID  . DISCONTD: senna-docusate  2 tablet Oral QHS   Continuous Infusions:   . TPN (CLINIMIX) +/- additives 80 mL/hr at 01/11/12 0542  . TPN (CLINIMIX) +/- additives 80 mL/hr at 01/09/12 2000  . TPN (CLINIMIX) +/- additives     PRN Meds:.acetaminophen, albuterol, bisacodyl, HYDROmorphone, ondansetron (ZOFRAN) IV, phenol, polyethylene glycol, sodium chloride  Labs:  CMP     Component Value Date/Time   NA 135 01/10/2012 0510   K 3.9 01/10/2012 0510   CL 96 01/10/2012 0510   CO2 29 01/10/2012 0510   GLUCOSE 125* 01/10/2012 0510   BUN 22 01/10/2012 0510   CREATININE 1.75* 01/10/2012 0510   CALCIUM 8.4 01/10/2012 0510   PROT 6.3 01/10/2012 0510   ALBUMIN 1.9* 01/10/2012 0510   AST 69* 01/10/2012 0510   ALT 26 01/10/2012 0510   ALKPHOS 63 01/10/2012 0510   BILITOT 0.5 01/10/2012 0510   GFRNONAA 46* 01/10/2012 0510   GFRAA 53* 01/10/2012 0510     Intake/Output Summary (Last 24 hours) at 01/11/12 1445 Last data filed at 01/11/12 1191  Gross per 24 hour  Intake   4226 ml  Output   2925 ml  Net   1301 ml   Weight Status: Stable at 260.5#  Estimated Nutritional Needs:  Kcal:2000-2200 kcal per day  Protein:113-128 grams per day  Fluid:2-2.2 liters per day    NUTRITION DIAGNOSIS:  -Inadequate oral  intake (NI-2.1). Status: Ongoing   RELATED TO: abdominal pain, N/V   AS EVIDENCE BY: acute pancreatitis, NPO status   MONITORING/EVALUATION(Goals):  Monitor nutritional intake: TPN and advancement of diet, labs, I/O's, weight changes  GOC: Meet >75% est nutritional needs and successful transition to tol of oral intake. Goals partially met.  EDUCATION NEEDS:  -Education not appropriate at this time   INTERVENTION:  -RD to monitor for nutrition needs   Francene Boyers Pager [phone]

## 2012-01-11 NOTE — Progress Notes (Signed)
Subjective: Still with pain. Had 2 stools yesterday. Urinating ok.  Objective: Vital signs in last 24 hours: Filed Vitals:   01/11/12 0400 01/11/12 0500 01/11/12 0714 01/11/12 0735  BP: 125/78 113/87    Pulse: 113 113    Temp: 98.5 F (36.9 C)   101.9 F (38.8 C)  TempSrc: Oral   Oral  Resp: 23 14    Height:      Weight:  118.1 kg (260 lb 5.8 oz)    SpO2: 96% 96% 96%    Weight change: -1.5 kg (-3 lb 4.9 oz)  Intake/Output Summary (Last 24 hours) at 01/11/12 0911 Last data filed at 01/11/12 0847  Gross per 24 hour  Intake   3606 ml  Output   2925 ml  Net    681 ml   Physical Exam:  Lungs clear to auscultation bilaterally without wheeze rhonchi or rales Cardiovascular regular rate rhythm without murmurs gallops rubs Abdomen:  distended. Bowel sounds quiet. Extremities: Sequential compression devices, 1+ edema  Lab Results: Basic Metabolic Panel:  Lab 01/10/12 7829 01/09/12 0444 01/07/12 0503  NA 135 133* --  K 3.9 3.9 --  CL 96 97 --  CO2 29 30 --  GLUCOSE 125* 137* --  BUN 22 24* --  CREATININE 1.75* 1.89* --  CALCIUM 8.4 8.2* --  MG 2.1 -- 2.3  PHOS 2.6 -- 2.2*   Liver Function Tests:  Lab 01/10/12 0510 01/09/12 0444  AST 69* 61*  ALT 26 23  ALKPHOS 63 74  BILITOT 0.5 0.5  PROT 6.3 6.2  ALBUMIN 1.9* 2.0*    Lab 01/10/12 0510 01/09/12 0444  LIPASE 85* 91*  AMYLASE -- --   No results found for this basename: AMMONIA:2 in the last 168 hours CBC:  Lab 01/11/12 0440 01/10/12 0510 01/07/12 0503 01/05/12 0516  WBC 11.2* 10.0 -- --  NEUTROABS -- -- 11.7* 8.6*  HGB 7.7* 7.9* -- --  HCT 23.8* 24.4* -- --  MCV 87.2 86.5 -- --  PLT 310 289 -- --   Cardiac Enzymes:  Lab 01/08/12 0420 01/07/12 0503 01/06/12 0849  CKTOTAL 1244* 1224* 1537*  CKMB -- -- --  CKMBINDEX -- -- --  TROPONINI -- -- --   BNP: No results found for this basename: PROBNP:3 in the last 168 hours D-Dimer: No results found for this basename: DDIMER:2 in the last 168  hours CBG:  Lab 01/10/12 0741 01/10/12 0421 01/10/12 0006 01/09/12 1940 01/09/12 1632 01/09/12 1144  GLUCAP 99 129* 116* 134* 109* 117*   Hemoglobin A1C: No results found for this basename: HGBA1C in the last 168 hours Fasting Lipid Panel:  Lab 01/07/12 0504  CHOL 149  HDL --  LDLCALC --  TRIG 203*  CHOLHDL --  LDLDIRECT --   Thyroid Function Tests: No results found for this basename: TSH,T4TOTAL,FREET4,T3FREE,THYROIDAB in the last 168 hours Coagulation: No results found for this basename: LABPROT:4,INR:4 in the last 168 hours Anemia Panel: No results found for this basename: VITAMINB12,FOLATE,FERRITIN,TIBC,IRON,RETICCTPCT in the last 168 hours Urine Drug Screen: Drugs of Abuse     Component Value Date/Time   LABOPIA POSITIVE* 12/30/2011 1433   COCAINSCRNUR NONE DETECTED 12/30/2011 1433   LABBENZ NONE DETECTED 12/30/2011 1433   AMPHETMU NONE DETECTED 12/30/2011 1433   THCU NONE DETECTED 12/30/2011 1433   LABBARB NONE DETECTED 12/30/2011 1433    Alcohol Level: No results found for this basename: ETH:2 in the last 168 hours Urinalysis:  Lab 01/09/12 0834  COLORURINE YELLOW  LABSPEC 1.020  PHURINE 6.0  GLUCOSEU NEGATIVE  HGBUR LARGE*  BILIRUBINUR NEGATIVE  KETONESUR NEGATIVE  PROTEINUR 30*  UROBILINOGEN 0.2  NITRITE NEGATIVE  LEUKOCYTESUR NEGATIVE   Pro calcitonin 1.36  Micro Results: Recent Results (from the past 240 hour(s))  CULTURE, BLOOD (ROUTINE X 2)     Status: Normal   Collection Time   01/05/12  8:04 AM      Component Value Range Status Comment   Specimen Description Blood PICC LINE   Final    Special Requests     Final    Value: BOTTLES DRAWN AEROBIC AND ANAEROBIC 6CC DRAWN BY RN   Culture NO GROWTH 6 DAYS   Final    Report Status 01/11/2012 FINAL   Final   CULTURE, BLOOD (ROUTINE X 2)     Status: Normal   Collection Time   01/05/12  8:12 AM      Component Value Range Status Comment   Specimen Description Blood LEFT ANTECUBITAL   Final    Special  Requests BOTTLES DRAWN AEROBIC AND ANAEROBIC 7CC   Final    Culture NO GROWTH 6 DAYS   Final    Report Status 01/11/2012 FINAL   Final   CULTURE, BLOOD (ROUTINE X 2)     Status: Normal (Preliminary result)   Collection Time   01/07/12  8:25 PM      Component Value Range Status Comment   Specimen Description LEFT ANTECUBITAL   Final    Special Requests BOTTLES DRAWN AEROBIC AND ANAEROBIC 6CC   Final    Culture NO GROWTH 4 DAYS   Final    Report Status PENDING   Incomplete   CULTURE, BLOOD (ROUTINE X 2)     Status: Normal (Preliminary result)   Collection Time   01/07/12  8:26 PM      Component Value Range Status Comment   Specimen Description LEFT ANTECUBITAL   Final    Special Requests BOTTLES DRAWN AEROBIC AND ANAEROBIC 6CC   Final    Culture NO GROWTH 4 DAYS   Final    Report Status PENDING   Incomplete   URINE CULTURE     Status: Normal   Collection Time   01/08/12  1:53 PM      Component Value Range Status Comment   Specimen Description URINE, CLEAN CATCH   Final    Special Requests NONE   Final    Culture  Setup Time [id number]   Final    Colony Count NO GROWTH   Final    Culture NO GROWTH   Final    Report Status 01/09/2012 FINAL   Final   CLOSTRIDIUM DIFFICILE BY PCR     Status: Normal   Collection Time   01/08/12  7:02 PM      Component Value Range Status Comment   C difficile by pcr NEGATIVE  NEGATIVE  Final    Studies/Results: Ct Abdomen Pelvis Wo Contrast  01/10/2012  *RADIOLOGY REPORT*  Clinical Data: Pancreatitis, retroperitoneal hemorrhage, elevated creatinine  CT ABDOMEN AND PELVIS WITHOUT CONTRAST  Technique:  Multidetector CT imaging of the abdomen and pelvis was performed following the standard protocol without intravenous contrast.  Comparison: 01/03/2012  Findings: Small bilateral pleural effusions with adjacent atelectasis/consolidation in basilar segments of visualized lower lobes.  There is a small amount of perihepatic and perisplenic ascites with some increase  in the amount of fluid in the pericolic gutters, left greater than right.  Mixed attenuation peripancreatic and retroperitoneal fluid has slightly increased, for  example anterior to the interpolar region right kidney image 42 and posterolateral to the left kidney at the level of the left renal vein image 44.  There is no pelvic ascites.  Stomach is nondilated. Duodenum remains thick walled.  Multiple dilated proximal and mid small bowel loops with fluid levels throughout the abdomen.  There are a few decompressed small bowel loops distally.  The colon is nondilated.  Urinary bladder is distended.  Bilateral pelvic phleboliths.  No pelvic or retroperitoneal adenopathy localized.  IMPRESSION:  1.  Some interval increase in peripancreatic and  retroperitoneal mixed attenuation fluid. 2. Persistent dilated proximal and mid small bowel  Original Report Authenticated By: Osa Craver, M.D.   Dg Chest Port 1 View  01/09/2012  *RADIOLOGY REPORT*  Clinical Data: PICC line  PORTABLE CHEST - 1 VIEW  Comparison: 01/03/2012  Findings: Tip of the right PICC line is at the cavoatrial junction. Low volumes.  Moderate cardiomegaly.  Bibasilar atelectasis. Bilateral pleural effusions.  IMPRESSION: PICC tip at cavoatrial junction  Bilateral pleural effusions and bibasilar atelectasis.  Original Report Authenticated By: Donavan Burnet, M.D.   Scheduled Meds:    . fluconazole (DIFLUCAN) IV  200 mg Intravenous Q24H  . furosemide  60 mg Intravenous BID  . imipenem-cilastatin  500 mg Intravenous Q8H  . levalbuterol  0.63 mg Nebulization Q6H  . senna-docusate  2 tablet Oral QHS  . sodium chloride  10-40 mL Intracatheter Q12H  . thiamine  100 mg Oral Daily  . vancomycin  1,250 mg Intravenous Q12H   Continuous Infusions:    . TPN (CLINIMIX) +/- additives 80 mL/hr at 01/11/12 0542  . TPN (CLINIMIX) +/- additives 80 mL/hr at 01/09/12 2000   PRN Meds:.acetaminophen, albuterol, bisacodyl, HYDROmorphone, ondansetron  (ZOFRAN) IV, phenol, polyethylene glycol, sodium chloride Assessment/Plan:   *Acute pancreatitis, severe. Continue TPN. No current indication for surgery. Repeat non-contrast CT shows no definate discrete abcess.   Fever continues despite broad spectrum abx and diflucan: C. difficile negative. Repeat urinalysis improved. Urine culture and repeat blood cultures negative to date. Pro calcitonin much improved from last week. Will doppler legs to r/o DVT. May be from pancreatitis v. Drug fever as well.   ARF (acute renal failure), continues to improve. Nephrology has signed off.  Alcohol abuse   Rhabdomyolysis, resolving   HTN (hypertension): normotensive off medications   Hepatic steatosis   Anemia due to dilution, critical illness, pancreatitis, retroperitoneal bleed. No evidence of GI bleed. Continue to monitor.    Hypoalbuminemia   Peripheral edema  Retroperitoneal bleed  Will discuss with consultants.  Prognosis remains guarded.    LOS: 13 days   Tearia Gibbs L 01/11/2012, 9:11 AM

## 2012-01-11 NOTE — Progress Notes (Signed)
REVIEWED. AGREE.  DISCUSSED REASONING FOR TRANSFER. NO INPT GI COVERAGE 4/5 1700 TO 4/8 0730.

## 2012-01-11 NOTE — Progress Notes (Signed)
Report given to [facility] by Billy Coast, RN.  Pt transferring to [facility] for further treatment and evaluation.  No acute distress noted.  Pt  Ready for transfer to [facility].

## 2012-01-11 NOTE — Discharge Summary (Addendum)
Physician Discharge Summary  Patient ID: OTHER ATIENZA MRN: [id number] DOB/AGE: Mar 25, 1971 41 y.o.  Admit date: 12/29/2011 Discharge date: 01/11/2012  Discharge Diagnoses:  Principal Problem:  *Acute pancreatitis, severe, alcohol related in the setting of hypertriglyceridemia  Continued Fever despite broad-spectrum antibiotics and antifungals  Alcohol withdrawal, resolved  ARF (acute renal failure), resolved  Hyperkalemia, resolved  Sepsis/SIRS  Alcohol abuse lactic acidosis, resolved  Hypertriglyceridemia  Rhabdomyolysis, resolving  UTI (urinary tract infection) with negative urine culture  History of HTN (hypertension)  Hepatic steatosis  Hypocalcemia, resolved  Ileus, resolved  Anemia, multifactorial, secondary to pancreatitis, acute illness, retroperitoneal hemorrhage  Hypoalbuminemia  Peripheral edema  Retroperitoneal bleed   Medication List  As of 01/11/2012 11:10 AM     Scheduled Meds:   . fluconazole (DIFLUCAN) IV  200 mg Intravenous Q24H  . imipenem-cilastatin  500 mg Intravenous Q8H  . levalbuterol  0.63 mg Nebulization Q6H  . sodium chloride  10-40 mL Intracatheter Q12H  . thiamine  100 mg Oral Daily  . vancomycin  1,250 mg Intravenous Q12H  . DISCONTD: furosemide  60 mg Intravenous BID  . DISCONTD: senna-docusate  2 tablet Oral QHS   Continuous Infusions:   . TPN (CLINIMIX) +/- additives 80 mL/hr at 01/11/12 0542  . TPN (CLINIMIX) +/- additives 80 mL/hr at 01/09/12 2000  . TPN (CLINIMIX) +/- additives     PRN Meds:.acetaminophen, albuterol, bisacodyl, HYDROmorphone, ondansetron (ZOFRAN) IV, phenol, polyethylene glycol, sodium chloride   Follow-up Information    Follow up with Colette Ribas, MD .         Disposition: Transfer to [facility], Dr.Verma is accepting physician  Discharged Condition: Stable  Consults: Nephrology, GI, general surgery  Labs:   Results for orders placed during the hospital  encounter of 12/29/11 (from the past 48 hour(s))  GLUCOSE, CAPILLARY     Status: Abnormal   Collection Time   01/09/12 11:44 AM      Component Value Range Comment   Glucose-Capillary 117 (*) 70 - 99 (mg/dL)    Comment 1 Notify RN      Comment 2 Documented in Chart     GLUCOSE, CAPILLARY     Status: Abnormal   Collection Time   01/09/12  4:32 PM      Component Value Range Comment   Glucose-Capillary 109 (*) 70 - 99 (mg/dL)    Comment 1 Notify RN      Comment 2 Documented in Chart     GLUCOSE, CAPILLARY     Status: Abnormal   Collection Time   01/09/12  7:40 PM      Component Value Range Comment   Glucose-Capillary 134 (*) 70 - 99 (mg/dL)    Comment 1 Documented in Chart      Comment 2 Notify RN     GLUCOSE, CAPILLARY     Status: Abnormal   Collection Time   01/10/12 12:06 AM      Component Value Range Comment   Glucose-Capillary 116 (*) 70 - 99 (mg/dL)    Comment 1 Documented in Chart      Comment 2 Notify RN     OCCULT BLOOD X 1 CARD TO LAB, STOOL     Status: Normal   Collection Time   01/10/12 12:22 AM      Component Value Range Comment   Fecal Occult Bld NEGATIVE     GLUCOSE, CAPILLARY     Status: Abnormal   Collection Time   01/10/12  4:21 AM      Component Value Range Comment   Glucose-Capillary 129 (*) 70 - 99 (mg/dL)   LIPASE, BLOOD     Status: Abnormal   Collection Time   01/10/12  5:10 AM      Component Value Range Comment   Lipase 85 (*) 11 - 59 (U/L)   CBC     Status: Abnormal   Collection Time   01/10/12  5:10 AM      Component Value Range Comment   WBC 10.0  4.0 - 10.5 (K/uL)    RBC 2.82 (*) 4.22 - 5.81 (MIL/uL)    Hemoglobin 7.9 (*) 13.0 - 17.0 (g/dL)    HCT 62.1 (*) 30.8 - 52.0 (%)    MCV 86.5  78.0 - 100.0 (fL)    MCH 28.0  26.0 - 34.0 (pg)    MCHC 32.4  30.0 - 36.0 (g/dL)    RDW 65.7  84.6 - 96.2 (%)    Platelets 289  150 - 400 (K/uL)   COMPREHENSIVE METABOLIC PANEL     Status: Abnormal   Collection Time   01/10/12  5:10 AM      Component Value Range Comment     Sodium 135  135 - 145 (mEq/L)    Potassium 3.9  3.5 - 5.1 (mEq/L)    Chloride 96  96 - 112 (mEq/L)    CO2 29  19 - 32 (mEq/L)    Glucose, Bld 125 (*) 70 - 99 (mg/dL)    BUN 22  6 - 23 (mg/dL)    Creatinine, Ser 9.52 (*) 0.50 - 1.35 (mg/dL)    Calcium 8.4  8.4 - 10.5 (mg/dL)    Total Protein 6.3  6.0 - 8.3 (g/dL)    Albumin 1.9 (*) 3.5 - 5.2 (g/dL)    AST 69 (*) 0 - 37 (U/L)    ALT 26  0 - 53 (U/L)    Alkaline Phosphatase 63  39 - 117 (U/L)    Total Bilirubin 0.5  0.3 - 1.2 (mg/dL)    GFR calc non Af Amer 46 (*) >90 (mL/min)    GFR calc Af Amer 53 (*) >90 (mL/min)   MAGNESIUM     Status: Normal   Collection Time   01/10/12  5:10 AM      Component Value Range Comment   Magnesium 2.1  1.5 - 2.5 (mg/dL)   PHOSPHORUS     Status: Normal   Collection Time   01/10/12  5:10 AM      Component Value Range Comment   Phosphorus 2.6  2.3 - 4.6 (mg/dL)   GLUCOSE, CAPILLARY     Status: Normal   Collection Time   01/10/12  7:41 AM      Component Value Range Comment   Glucose-Capillary 99  70 - 99 (mg/dL)   VANCOMYCIN, TROUGH     Status: Normal   Collection Time   01/10/12  8:09 AM      Component Value Range Comment   Vancomycin Tr 16.2  10.0 - 20.0 (ug/mL)   CBC     Status: Abnormal   Collection Time   01/11/12  4:40 AM      Component Value Range Comment   WBC 11.2 (*) 4.0 - 10.5 (K/uL)    RBC 2.73 (*) 4.22 - 5.81 (MIL/uL)    Hemoglobin 7.7 (*) 13.0 - 17.0 (g/dL)    HCT 84.1 (*) 32.4 - 52.0 (%)    MCV 87.2  78.0 -  100.0 (fL)    MCH 28.2  26.0 - 34.0 (pg)    MCHC 32.4  30.0 - 36.0 (g/dL)    RDW 19.1  47.8 - 29.5 (%)    Platelets 310  150 - 400 (K/uL)     Diagnostics:  Ct Abdomen Pelvis Wo Contrast  01/10/2012  *RADIOLOGY REPORT*  Clinical Data: Pancreatitis, retroperitoneal hemorrhage, elevated creatinine  CT ABDOMEN AND PELVIS WITHOUT CONTRAST  Technique:  Multidetector CT imaging of the abdomen and pelvis was performed following the standard protocol without intravenous contrast.   Comparison: 01/03/2012  Findings: Small bilateral pleural effusions with adjacent atelectasis/consolidation in basilar segments of visualized lower lobes.  There is a small amount of perihepatic and perisplenic ascites with some increase in the amount of fluid in the pericolic gutters, left greater than right.  Mixed attenuation peripancreatic and retroperitoneal fluid has slightly increased, for example anterior to the interpolar region right kidney image 42 and posterolateral to the left kidney at the level of the left renal vein image 44.  There is no pelvic ascites.  Stomach is nondilated. Duodenum remains thick walled.  Multiple dilated proximal and mid small bowel loops with fluid levels throughout the abdomen.  There are a few decompressed small bowel loops distally.  The colon is nondilated.  Urinary bladder is distended.  Bilateral pelvic phleboliths.  No pelvic or retroperitoneal adenopathy localized.  IMPRESSION:  1.  Some interval increase in peripancreatic and  retroperitoneal mixed attenuation fluid. 2. Persistent dilated proximal and mid small bowel  Original Report Authenticated By: Osa Craver, M.D.   Ct Abdomen Pelvis Wo Contrast  01/03/2012  *RADIOLOGY REPORT*  Clinical Data: Abdominal pain, nausea and vomiting.  Upper and lower back pain.  Shortness of breath.  Chest pressure.  Renal insufficiency.  CT ABDOMEN AND PELVIS WITHOUT CONTRAST  Technique:  Multidetector CT imaging of the abdomen and pelvis was performed following the standard protocol without intravenous contrast.  Comparison: Previous examinations, including the abdomen radiographs obtained earlier today and abdomen and pelvis CT dated 12/29/2011.  Findings: The examination is limited by the lack of intravenous contrast as well as streak artifacts produced by the patient's right arm.  There has been interval further ill defined increased enlargement of the entire pancreas.  There is wall thickening involving the adjacent  portions of the duodenum with significant luminal narrowing in the second portion of the duodenum.  A nasogastric tube is in place with its tip in the mid to distal stomach.  Increased peripancreatic soft tissue stranding and ill-defined fluid, extending inferiorly in the anterior pararenal space as well as interval fluid and stranding in the posterior pararenal space. The perinephric spaces are preserved.  Interval small bilateral pleural effusions and bilateral lower lobe atelectasis.  Interval dilatation of multiple small bowel loops and transverse colon.  Interval small amount of air in the urinary bladder, suggesting recent catheterization.  Interval diffuse subcutaneous edema as well as a multiple small foci of subcutaneous air on the right, compatible with recent subcutaneous injections.  Interval ill-defined soft tissue and fluid density extending into the left upper abdomen below the hemidiaphragm.  Lumbar and lower thoracic spine degenerative changes.  IMPRESSION:  1.  Limited examination due to the lack of intravenous contrast and streak artifacts, as described above. 2.  Significant progression of marked changes of acute pancreatitis with no discrete, defined fluid collections seen at this time. 3.  Interval small bilateral pleural effusions and bilateral lower lobe atelectasis. 4.  Interval  small bowel and transverse colon ileus.  Original Report Authenticated By: Darrol Angel, M.D.   Dg Chest 2 View  01/03/2012  *RADIOLOGY REPORT*  Clinical Data: Wheezing and abdominal distention  CHEST - 2 VIEW  Comparison: 12/30/2011 and CT 12/30/2011  Findings: Low lung volumes are present.  Taking this into consideration heart and mediastinal contours are stable.  There is a small left pleural effusion identified.  Associated density at the left lung base likely represents atelectasis given the low lung volumes and presence of pleural fluid however an area of focal pneumonia is not excluded in the appropriate  clinical setting. No signs of congestive failure or interstitial edema are noted and the pleural effusion may be related to the history of acute pancreatitis.  Remainder of the lung fields appear clear.  IMPRESSION: Persistently low lung volumes with left pleural effusion and probable left basilar volume loss.  Original Report Authenticated By: Bertha Stakes, M.D.   Dg Abd 1 View  01/04/2012  *RADIOLOGY REPORT*  Clinical Data: Abdominal pain.  ABDOMEN - 1 VIEW  Comparison: CT from 01/03/2012.  Abdomen x-ray from 01/03/2012.  Findings: As before, there is diffuse gaseous bowel distention involving large and small intestine.  Maximum small bowel distention is about 6.1 cm today which when remeasuring in the same location on yesterday's study, reveals no substantial interval change.  IMPRESSION: Persistent diffuse dilatation of large and small bowel as before.  Original Report Authenticated By: ERIC A. MANSELL, M.D.   Dg Abd 1 View  01/03/2012  *RADIOLOGY REPORT*  Clinical Data: Small bowel obstruction  ABDOMEN - 1 VIEW  Comparison: Plain film 12/31/2011, CT 12/29/2011  Findings: Supine exam demonstrates dilated loops of small bowel up to 5 cm which is not improved compared to plain film of 03/25 2013 and worsened compared to CT of 12/29/2011.  There is gas in the transverse colon.  No clear evidence of gas the rectum.  IMPRESSION: Findings consistent with worsening ileus versus small bowel obstruction.  This was made a call report.  Original Report Authenticated By: Genevive Bi, M.D.   US Abdomen Complete  12/31/2011  *RADIOLOGY REPORT*  Clinical Data:  Pancreatitis.  Acute renal failure.  Sepsis. Hypertension.  COMPLETE ABDOMINAL ULTRASOUND  Comparison:  CT of the abdomen pelvis 12/29/2011  Findings:  Gallbladder:  Gallbladder wall is normal in thickness.  Possible sludge identified.  No stones or pericholecystic fluid.  No sonographic Murphy's sign.  Common bile duct:  3.5 mm.  Liver:  The liver  appears diffusely echogenic without focal liver lesion.  There is poor delineation of internal hepatic structures. Poor definition of the diaphragm.  IVC:  There is limited evaluation of the inferior vena cava.  The visualized portion appears normal.  Pancreas:  The pancreas is not well seen because of overlying bowel gas.  Spleen:  The spleen is not well seen because of overlying bowel gas.  Right Kidney: The right kidney is 12.2 cm in length.  No hydronephrosis.  Left Kidney:  The left kidney is 13 cm in length.  There is limited evaluation of the left kidney.  Abdominal aorta:  The distal abdominal aorta is not well seen because of bowel gas.  The proximal aorta is not aneurysmal.  Additional findings:  Note is made of ascites.  IMPRESSION:  1.  No evidence for acute cholecystitis.  Gallbladder sludge is present. 2.  Portions of the abdomen are not well seen because of bowel gas. 3.  Ascites. 4.  Fatty liver.  Original Report Authenticated By: Patterson Hammersmith, M.D.   Ct Abdomen Pelvis W Contrast  12/29/2011  *RADIOLOGY REPORT*  Clinical Data: Abdominal pain, elevated LFTs and lipase, no history of pancreatitis  CT ABDOMEN AND PELVIS WITH CONTRAST  Technique:  Multidetector CT imaging of the abdomen and pelvis was performed following the standard protocol during bolus administration of intravenous contrast.  Contrast:  80 ml Omnipaque-300  Comparison: CT abdomen pelvis - 04/26/2007  Findings:  Normal hepatic contour.  There is diffuse decreased attenuation of the hepatic parenchyma on this postcontrast examination, there is suggestion of hepatic steatosis.  No discrete hepatic lesions. Normal gallbladder.  No intra or extrahepatic biliary ductal dilatation.  There is symmetric enhancement and screw through the bilateral kidneys.  No discrete renal lesions.  No urinary obstruction.  The bilateral adrenal glands are normal.  Normal pancreas.  Their pancreas is thickened with associated extensive stranding and  fluid about the pancreas.  There is no definitive evidence of pancreatic ductal dilatation.  The pancreas enhances normally.  No drainable peripancreatic fluid collection or abscess.  Scattered colonic diverticulosis without evidence of diverticulitis.  Enteric contrast extends to the distal small bowel.  No evidence of enteric obstruction.  Normal appendix.  No pneumoperitoneum, pneumatosis or portal venous gas.  Normal caliber of the abdominal aorta.  The major branch vessels of the abdominal aorta, including the IMA, are patent.  The splenic artery and vein are patent.  Limited visualization of the lung bases demonstrates bibasilar ground-glass opacities favored to represent atelectasis.  No pneumothorax.  Normal heart size.  No pericardial effusion. Minimal calcifications within the aortic valve annulus.  No acute or aggressive osseous abnormalities.  Minimal degenerative change of L5 - S1.  IMPRESSION:  1.  Findings compatible with acute pancreatitis.  No definitive areas of pancreatic necrosis.  No drainable peripancreatic fluid collection or abscess.  2. Hepatic steatosis.  No intra or extrahepatic biliary ductal dilatation.  Original Report Authenticated By: Waynard Reeds, M.D.   Dg Chest Port 1 View  01/09/2012  *RADIOLOGY REPORT*  Clinical Data: PICC line  PORTABLE CHEST - 1 VIEW  Comparison: 01/03/2012  Findings: Tip of the right PICC line is at the cavoatrial junction. Low volumes.  Moderate cardiomegaly.  Bibasilar atelectasis. Bilateral pleural effusions.  IMPRESSION: PICC tip at cavoatrial junction  Bilateral pleural effusions and bibasilar atelectasis.  Original Report Authenticated By: Donavan Burnet, M.D.   Dg Chest Port 1 View  12/30/2011  *RADIOLOGY REPORT*  Clinical Data: Fever and pancreatitis  PORTABLE CHEST - 1 VIEW  Comparison: 10/15/2011  Findings: Mildly degraded exam due to AP portable technique and patient body habitus.  Numerous leads and wires project over the chest.  Normal  heart size for level of inspiration.  No definite pleural effusion. No pneumothorax.  Extremely low lung volumes.  Bibasilar volume loss.  IMPRESSION: Low lung volumes with bibasilar volume loss/atelectasis.  No definite acute disease.  Decreased sensitivity and specificity exam due to technique related factors, as described above.  Original Report Authenticated By: Consuello Bossier, M.D.   Dg Abd Acute W/chest  12/31/2011  *RADIOLOGY REPORT*  Clinical Data: PICC line placement  ACUTE ABDOMEN SERIES (ABDOMEN 2 VIEW & CHEST 1 VIEW)  Comparison: 10/15/2011  Findings: 1152 hours.  Study is lordotic with markedly low lung volumes.  This distorts cardiomediastinal anatomy.  There is some apparent bibasilar atelectasis.  Right PICC line tip projects at the mid SVC level, but this  position is based on the very low volumes and lordotic positioning.  Upright film shows no intraperitoneal free air.  The supine film shows gaseous small bowel dilatation up to 5.0 cm in diameter. Oral contrast material from the CT scan 2 days ago is seen in the terminal ileum and right colon.  IMPRESSION: Right PICC line tip projects at the mid SVC level.  Please see full report above.  No intraperitoneal free air.  There is diffuse gaseous small bowel distention with oral contrast material from the CT of 2 days ago now seen in the right and transverse colon.  Original Report Authenticated By: ERIC A. MANSELL, M.D.   Dg Abd Portable 1v  01/07/2012  *RADIOLOGY REPORT*  Clinical Data: Abdominal pain  PORTABLE ABDOMEN - 1 VIEW  Comparison:  01/04/2012  Findings: Persistent moderate small bowel gaseous distention probable due to ileus or partial bowel obstruction.  Moderate gaseous distention of the transverse colon.  Contrast material from recent CT scan noted in distal colon.  IMPRESSION: Persistent moderate small bowel gaseous distention probable due to ileus or partial bowel obstruction.  Moderate gaseous distention of the transverse colon.   Contrast material from recent CT scan noted in distal colon.  Original Report Authenticated By: Natasha Mead, M.D.    Procedures: PICC line  EKG: Sinus tachycardia  Hospital Course: Patient is a 42 year old black male with a history of alcohol abuse who presented to the hospital on 12/29/2011 with abdominal pain and vomiting. Lipase was 1200. CT scan of the abdomen and pelvis with contrast showed pancreatitis and hepatic steatosis. He has a history of hypertension but had been off his medications. Initially, he had a temperature of 98. Pulse 90. Blood pressure 116/78. Respiratory rate 18. 104 kg. His uncomfortable appearing, cooperative, soft diffusely tender abdomen with normal bowel sounds. He was oriented. Initial creatinine was 1.69. AST 170. ALT 108. Calcium normal. Normal bilirubin and alkaline phosphatase.  Patient's hospital course has been quite complicated. Initially, Patient was admitted to the floor, started on bowel rest, pain and nausea medications. On hospital day #2, he was noted to have an increasing creatinine to 3.5 today. BUN was 25. Potassium 6.0. Bicarbonate 16. Elevated lactate and a pro calcitonin of 19. Patient was tachycardic, tachypnea, but no hypotension. He was given medical treatment for the hyperkalemia and started on vigorous IV hydration, transferred to the intensive care unit. He also spiked a fever and was started on Primaxin and cultures were done. A CPK also was noted to be elevated and patient had normal troponins, consistent with rhabdomyolysis. Patient also had evidence of alcohol withdrawal and was treated with thiamine and Ativan. Nephrology was consulted. Patient's renal failure was related to SIRS/prerenal azotemia/IV contrast nephropathy. Ultrasound was done and showed no hydronephrosis. No gallstones. Patient's culture results remain negative. Patient's acidosis, renal failure, electrolyte disturbances have improved. He was started on a clear liquid diet,  subsequently full liquids. However, he developed increasing abdominal distention. GI was consulted. Imaging studies were suggestive of ileus and he had an NG tube to suction. He started having bowel movements and this was removed. He was started on TPN at that time as well. PICC line was placed on 3/24  He had been afebrile largely since admission until April 1. He started spiking fevers to 102/103. His antibiotics were broadened to include vancomycin and Diflucan in addition to the Primaxin. Repeat urinalysis cultures have been negative. Repeat CT scan without contrast shows severe pancreatitis with fluid but no obvious discrete  abscess. Patient also did have some consolidation at the lung bases.  Patient developed anemia, and on 3/28 Hgb was noted to be 7.4. Hemoccult of stools were negative. A repeat CAT scan without contrast showed evidence of retroperitoneal hemorrhage. He had been on subcutaneous heparin for DVT prophylaxis which was stopped. He required 2 units of packed red blood cells and his hemoglobin has remained stable for several days now.  Patient continues to spike intermittent fevers. He continues to have significant pain, abdominal distention. Due to renal function, patient has not had a contrasted CAT scan repeated, and therefore difficult to rule out pancreatic necrosis or abscess. Patient would benefit from transferred to tertiary center for multispecialty coverage. Would recommend GI and surgery to continue to follow. Also consider infectious disease consult. I have spoken with Dr. Maple Hudson who recommends patient be transferred to the floor. Dr. Corinda Gubler has kindly accepted the patient. Total time 90 minutes  Discharge Exam: See progress note  Signed: Kable Haywood L 01/11/2012, 11:10 AM

## 2012-01-11 NOTE — Consult Note (Signed)
PARENTERAL NUTRITION CONSULT NOTE   Pharmacy Consult for TPN Indication: severe pancreatitis  Allergies  Allergen Reactions  . Ace Inhibitors Swelling    Lip swelling   Patient Measurements: Height: 5\' 10"  (177.8 cm) Weight: 260 lb 5.8 oz (118.1 kg) IBW/kg (Calculated) : 73  Adjusted Body Weight: 87KG  Vital Signs: Temp: 101.9 F (38.8 C) (04/05 0735) Temp src: Oral (04/05 0735) BP: 113/87 mmHg (04/05 0500) Pulse Rate: 113  (04/05 0500) Intake/Output from previous day: 04/04 0701 - 04/05 0700 In: 3966 [P.O.:360; I.V.:10; IV Piggyback:900; TPN:2696] Out: 2150 [Urine:2150] Intake/Output from this shift: Total I/O In: -  Out: 975 [Urine:975]  Labs:  [facility] 01/11/12 0440 01/10/12 0510 01/09/12 0444  WBC 11.2* 10.0 9.4  HGB 7.7* 7.9* 7.8*  HCT 23.8* 24.4* 23.8*  PLT 310 289 280  APTT -- -- --  INR -- -- --    Basename 01/10/12 0510 01/09/12 0444  NA 135 133*  K 3.9 3.9  CL 96 97  CO2 29 30  GLUCOSE 125* 137*  BUN 22 24*  CREATININE 1.75* 1.89*  LABCREA -- --  CREAT24HRUR -- --  CALCIUM 8.4 8.2*  MG 2.1 --  PHOS 2.6 --  PROT 6.3 6.2  ALBUMIN 1.9* 2.0*  AST 69* 61*  ALT 26 23  ALKPHOS 63 74  BILITOT 0.5 0.5  BILIDIR -- --  IBILI -- --  PREALBUMIN -- --  TRIG -- --  CHOLHDL -- --  CHOL -- --   Estimated Creatinine Clearance: 70.1 ml/min (by C-G formula based on Cr of 1.75).    Basename 01/10/12 0741 01/10/12 0421 01/10/12 0006  GLUCAP 99 129* 116*   Medical History: Past Medical History  Diagnosis Date  . Hypertension   . Ileus 01/03/2012  . Retroperitoneal bleed 01/04/2012   Medications:  Scheduled:     . fluconazole (DIFLUCAN) IV  200 mg Intravenous Q24H  . furosemide  60 mg Intravenous BID  . imipenem-cilastatin  500 mg Intravenous Q8H  . levalbuterol  0.63 mg Nebulization Q6H  . senna-docusate  2 tablet Oral QHS  . sodium chloride  10-40 mL Intracatheter Q12H  . thiamine  100 mg Oral Daily  . vancomycin  1,250 mg Intravenous Q12H    Insulin Requirements in the past 24 hours:  Insulin in TPN - SSI stopped by MD  Current Nutrition:  TPN at 69ml/hr  Assessment: Weight Stable Diet NPO Renal failure improving Obesity Severe pancreatitis Hypoalbuminemia, prealbumin remains low, no improvement E-lytes replenished Hyperglycemia due to TPN, improving, adjusting insulin   Nutritional Goals:  2000-2100 kCal, 100-120 grams of protein per day  Plan: Continue TPN at 62ml/hr today (Clinimix E 5/15). Metabolic labs not ordered for today. Diet NPO NO LIPID (FAT) INFUSION per Dr Darrick Penna (d/w MD) Continue regular insulin 30 units/Liter (60 units per bag) Continue IVF (saline), rate per MD MVI and Trace Elements every Mon-Wed-Fri (shortage) F/U Lytes, fluid status, renal fxn, glucose tolerance (CBG's every 6 hours) tomorrow  Mady Gemma 01/11/2012,9:15 AM

## 2012-01-14 LAB — CULTURE, BLOOD (ROUTINE X 2): Culture: NO GROWTH

## 2012-01-14 NOTE — Progress Notes (Signed)
Discharge summary sent to payer through [facility]  

## 2012-01-18 NOTE — Progress Notes (Signed)
UR Chart Review Completed  

## 2012-11-21 ENCOUNTER — Emergency Department (HOSPITAL_COMMUNITY)
Admission: EM | Admit: 2012-11-21 | Discharge: 2012-11-21 | Disposition: A | Discharge status: Home / Self Care | Payer: Medicaid Other | Attending: Emergency Medicine | Admitting: Emergency Medicine

## 2012-11-21 ENCOUNTER — Encounter (HOSPITAL_COMMUNITY): Payer: Self-pay

## 2012-11-21 ENCOUNTER — Other Ambulatory Visit: Payer: Self-pay

## 2012-11-21 ENCOUNTER — Emergency Department (HOSPITAL_COMMUNITY): Payer: Medicaid Other

## 2012-11-21 DIAGNOSIS — I10 Essential (primary) hypertension: Secondary | ICD-10-CM | POA: Insufficient documentation

## 2012-11-21 DIAGNOSIS — Z79899 Other long term (current) drug therapy: Secondary | ICD-10-CM | POA: Insufficient documentation

## 2012-11-21 DIAGNOSIS — Z8719 Personal history of other diseases of the digestive system: Secondary | ICD-10-CM | POA: Insufficient documentation

## 2012-11-21 DIAGNOSIS — K859 Acute pancreatitis without necrosis or infection, unspecified: Secondary | ICD-10-CM | POA: Insufficient documentation

## 2012-11-21 DIAGNOSIS — G8918 Other acute postprocedural pain: Secondary | ICD-10-CM | POA: Insufficient documentation

## 2012-11-21 DIAGNOSIS — K8591 Acute pancreatitis with uninfected necrosis, unspecified: Secondary | ICD-10-CM

## 2012-11-21 DIAGNOSIS — R079 Chest pain, unspecified: Secondary | ICD-10-CM | POA: Insufficient documentation

## 2012-11-21 DIAGNOSIS — R109 Unspecified abdominal pain: Secondary | ICD-10-CM | POA: Insufficient documentation

## 2012-11-21 DIAGNOSIS — Z932 Ileostomy status: Secondary | ICD-10-CM | POA: Insufficient documentation

## 2012-11-21 LAB — CBC WITH DIFFERENTIAL/PLATELET
Eosinophils Absolute: 0.1 10*3/uL (ref 0.0–0.7)
Eosinophils Relative: 2 % (ref 0–5)
HCT: 25.9 % — ABNORMAL LOW (ref 39.0–52.0)
Hemoglobin: 8.2 g/dL — ABNORMAL LOW (ref 13.0–17.0)
Lymphocytes Relative: 16 % (ref 12–46)
Lymphs Abs: 0.8 10*3/uL (ref 0.7–4.0)
MCH: 23.6 pg — ABNORMAL LOW (ref 26.0–34.0)
MCV: 74.6 fL — ABNORMAL LOW (ref 78.0–100.0)
Monocytes Absolute: 0.4 10*3/uL (ref 0.1–1.0)
Monocytes Relative: 8 % (ref 3–12)
Platelets: 429 10*3/uL — ABNORMAL HIGH (ref 150–400)
RBC: 3.47 MIL/uL — ABNORMAL LOW (ref 4.22–5.81)
WBC: 5.1 10*3/uL (ref 4.0–10.5)

## 2012-11-21 LAB — COMPREHENSIVE METABOLIC PANEL
ALT: 65 U/L — ABNORMAL HIGH (ref 0–53)
BUN: 22 mg/dL (ref 6–23)
CO2: 22 mEq/L (ref 19–32)
Calcium: 10.1 mg/dL (ref 8.4–10.5)
GFR calc Af Amer: 54 mL/min — ABNORMAL LOW (ref >90)
GFR calc non Af Amer: 46 mL/min — ABNORMAL LOW (ref >90)
Glucose, Bld: 163 mg/dL — ABNORMAL HIGH (ref 70–99)
Sodium: 131 mEq/L — ABNORMAL LOW (ref 135–145)
Total Protein: 9.3 g/dL — ABNORMAL HIGH (ref 6.0–8.3)

## 2012-11-21 LAB — PROTIME-INR
INR: 1.22 (ref 0.00–1.49)
Prothrombin Time: 15.2 seconds (ref 11.6–15.2)

## 2012-11-21 LAB — LACTIC ACID, PLASMA: Lactic Acid, Venous: 1.5 mmol/L (ref 0.5–2.2)

## 2012-11-21 MED ORDER — OXYCODONE-ACETAMINOPHEN 5-325 MG PO TABS
2.0000 | ORAL_TABLET | Freq: Once | ORAL | Status: AC
  Administered 2012-11-21: 2 via ORAL
  Filled 2012-11-21: qty 2

## 2012-11-21 MED ORDER — HYDROMORPHONE HCL PF 1 MG/ML IJ SOLN
1.0000 mg | Freq: Once | INTRAMUSCULAR | Status: AC
  Administered 2012-11-21: 1 mg via INTRAVENOUS
  Filled 2012-11-21: qty 1

## 2012-11-21 MED ORDER — SODIUM CHLORIDE 0.9 % IV SOLN: INTRAVENOUS | Status: DC

## 2012-11-21 MED ORDER — SODIUM CHLORIDE 0.9 % IV BOLUS (SEPSIS)
2000.0000 mL | Freq: Once | INTRAVENOUS | Status: AC
  Administered 2012-11-21: 2000 mL via INTRAVENOUS

## 2012-11-21 MED ORDER — DIPHENHYDRAMINE HCL 50 MG/ML IJ SOLN
50.0000 mg | Freq: Once | INTRAMUSCULAR | Status: AC
  Administered 2012-11-21: 50 mg via INTRAVENOUS
  Filled 2012-11-21: qty 1

## 2012-11-21 MED ORDER — OXYCODONE HCL 15 MG PO TABS: 15.0000 mg | ORAL_TABLET | ORAL | Status: DC | PRN

## 2012-11-21 NOTE — ED Notes (Signed)
Pt reports cp off/on x2 days, pain worse today, mid-sternal no radiation.  +nausea, no vomiting.  Also recovering rom ab surgery 3 weeks ago.
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
## 2012-11-21 NOTE — ED Notes (Signed)
Pt ran out of his oxycodone this am.

## 2013-03-26 IMAGING — CR DG PELVIS 1-2V
1 series · 1 of 1 positions shown · non-contrast
Comparison: 11/17/2007

CLINICAL DATA: Fall.  Pain.

PELVIS - 1-2 VIEW

[view not recorded]
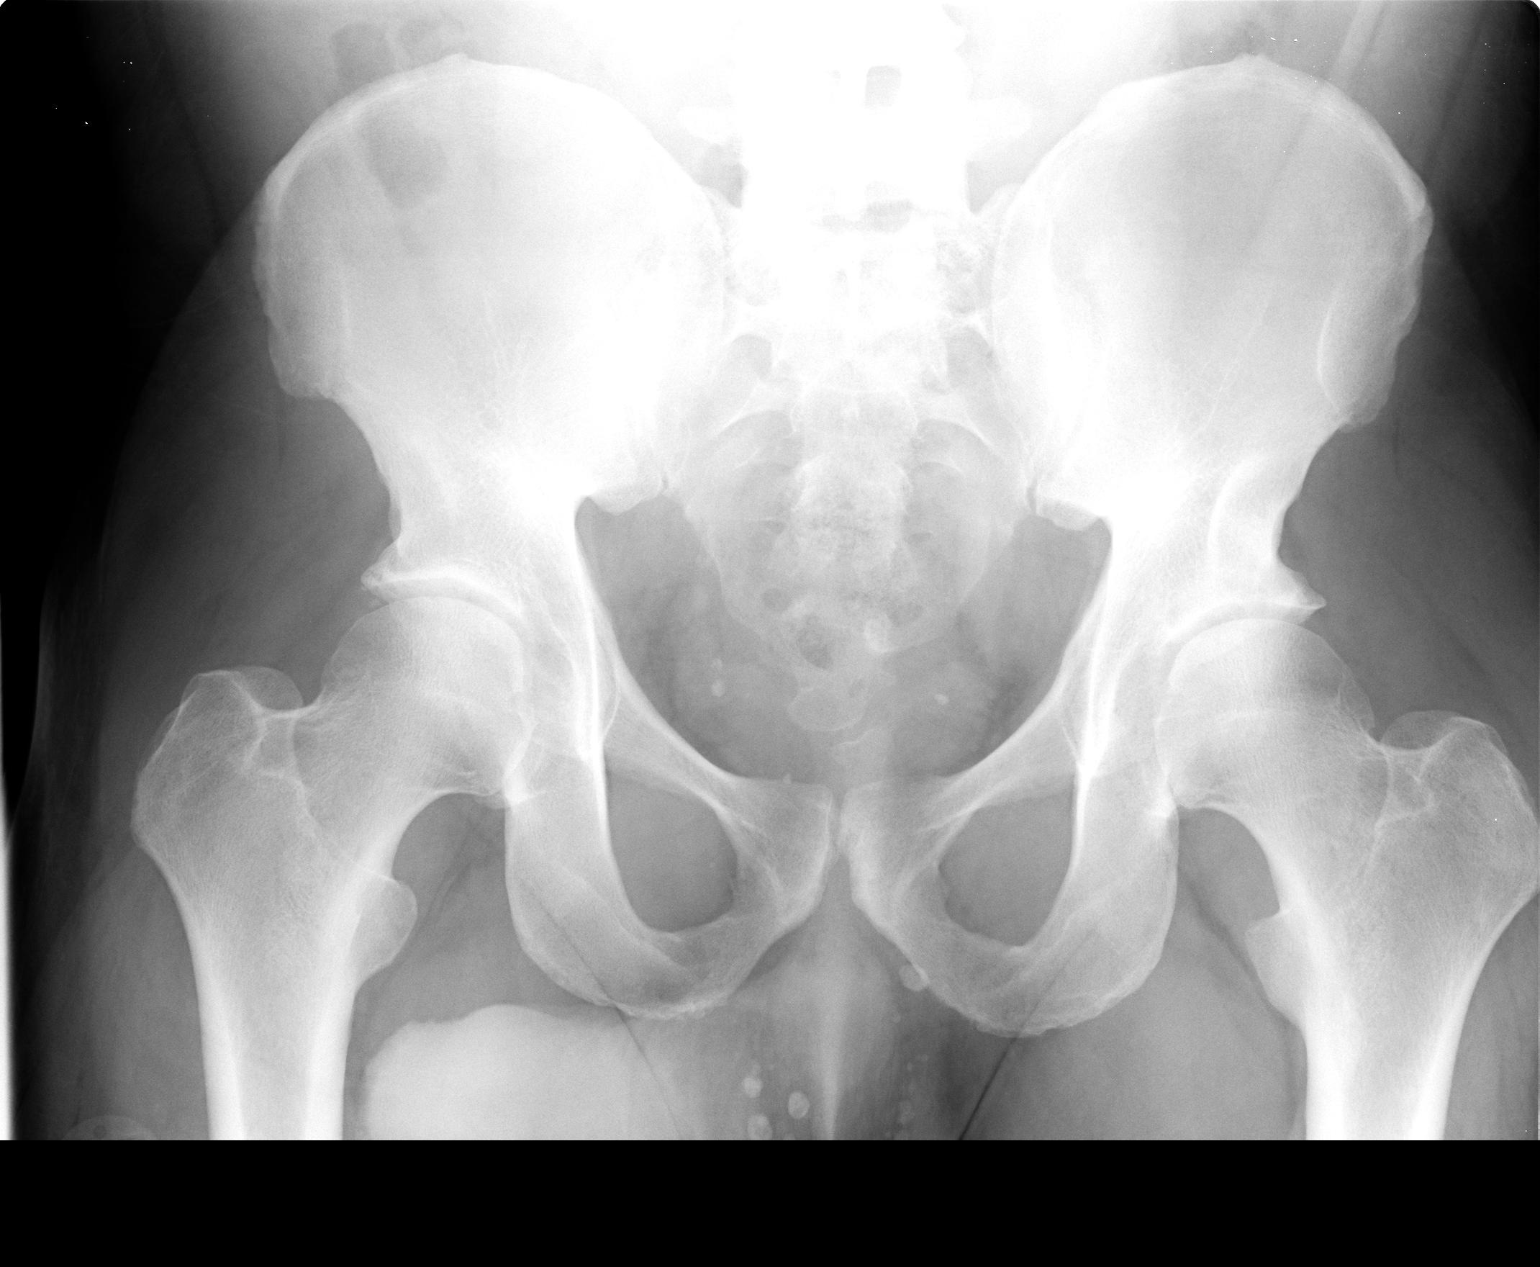

[1 of 1 positions shown; findings below may reference images not displayed]

FINDINGS: No acute fracture and no dislocation.  Moderate
degenerative change of both hip joints.  This has progressed.
IMPRESSION: No acute bony pathology peri degenerative change.

## 2014-08-03 ENCOUNTER — Encounter (HOSPITAL_COMMUNITY): Payer: Self-pay | Admitting: Emergency Medicine

## 2014-08-03 ENCOUNTER — Emergency Department (HOSPITAL_COMMUNITY)
Admission: EM | Admit: 2014-08-03 | Discharge: 2014-08-04 | Disposition: A | Discharge status: Home / Self Care | Payer: Medicaid Other | Attending: Emergency Medicine | Admitting: Emergency Medicine

## 2014-08-03 DIAGNOSIS — R197 Diarrhea, unspecified: Secondary | ICD-10-CM | POA: Insufficient documentation

## 2014-08-03 DIAGNOSIS — Z8719 Personal history of other diseases of the digestive system: Secondary | ICD-10-CM | POA: Diagnosis not present

## 2014-08-03 DIAGNOSIS — I10 Essential (primary) hypertension: Secondary | ICD-10-CM | POA: Diagnosis not present

## 2014-08-03 DIAGNOSIS — R109 Unspecified abdominal pain: Secondary | ICD-10-CM

## 2014-08-03 DIAGNOSIS — Z9889 Other specified postprocedural states: Secondary | ICD-10-CM | POA: Insufficient documentation

## 2014-08-03 DIAGNOSIS — R1031 Right lower quadrant pain: Secondary | ICD-10-CM | POA: Diagnosis present

## 2014-08-03 DIAGNOSIS — Z79899 Other long term (current) drug therapy: Secondary | ICD-10-CM | POA: Diagnosis not present

## 2014-08-03 HISTORY — DX: Acute pancreatitis without necrosis or infection, unspecified: K85.90

## 2014-08-03 LAB — CBC WITH DIFFERENTIAL/PLATELET
BASOS ABS: 0 10*3/uL (ref 0.0–0.1)
BASOS PCT: 0 % (ref 0–1)
Eosinophils Absolute: 0.1 10*3/uL (ref 0.0–0.7)
Eosinophils Relative: 2 % (ref 0–5)
HCT: 35.4 % — ABNORMAL LOW (ref 39.0–52.0)
HEMOGLOBIN: 11.9 g/dL — AB (ref 13.0–17.0)
Lymphocytes Relative: 30 % (ref 12–46)
Lymphs Abs: 1.1 10*3/uL (ref 0.7–4.0)
MCH: 27.8 pg (ref 26.0–34.0)
MCHC: 33.6 g/dL (ref 30.0–36.0)
MCV: 82.7 fL (ref 78.0–100.0)
MONOS PCT: 8 % (ref 3–12)
Monocytes Absolute: 0.3 10*3/uL (ref 0.1–1.0)
NEUTROS ABS: 2.2 10*3/uL (ref 1.7–7.7)
NEUTROS PCT: 60 % (ref 43–77)
PLATELETS: 145 10*3/uL — AB (ref 150–400)
RBC: 4.28 MIL/uL (ref 4.22–5.81)
RDW: 14.9 % (ref 11.5–15.5)
WBC: 3.7 10*3/uL — ABNORMAL LOW (ref 4.0–10.5)

## 2014-08-03 LAB — BASIC METABOLIC PANEL
ANION GAP: 11 (ref 5–15)
BUN: 13 mg/dL (ref 6–23)
CHLORIDE: 102 meq/L (ref 96–112)
CO2: 25 mEq/L (ref 19–32)
Calcium: 9.4 mg/dL (ref 8.4–10.5)
Creatinine, Ser: 1.69 mg/dL — ABNORMAL HIGH (ref 0.50–1.35)
GFR calc non Af Amer: 47 mL/min — ABNORMAL LOW (ref >90)
GFR, EST AFRICAN AMERICAN: 55 mL/min — AB (ref >90)
Glucose, Bld: 184 mg/dL — ABNORMAL HIGH (ref 70–99)
POTASSIUM: 4.1 meq/L (ref 3.7–5.3)
SODIUM: 138 meq/L (ref 137–147)

## 2014-08-03 LAB — LIPASE, BLOOD: LIPASE: 29 U/L (ref 11–59)

## 2014-08-03 LAB — AMYLASE: AMYLASE: 90 U/L (ref 0–105)

## 2014-08-03 MED ORDER — MORPHINE SULFATE 4 MG/ML IJ SOLN
4.0000 mg | Freq: Once | INTRAMUSCULAR | Status: AC
  Administered 2014-08-04: 4 mg via INTRAVENOUS
  Filled 2014-08-03: qty 1

## 2014-08-03 MED ORDER — ONDANSETRON HCL 4 MG/2ML IJ SOLN
4.0000 mg | Freq: Once | INTRAMUSCULAR | Status: AC
  Administered 2014-08-04: 4 mg via INTRAMUSCULAR
  Filled 2014-08-03: qty 2

## 2014-08-03 NOTE — ED Notes (Signed)
Pt updated on delay, comfort measures provided

## 2014-08-03 NOTE — ED Notes (Signed)
Pt updated on plan of care, comfort measure provided,

## 2014-08-03 NOTE — ED Notes (Signed)
Patient complaining of abdominal pain. History of pancreatitis per family. States that it started a couple of days ago and got worse tonight. 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

## 2014-08-04 ENCOUNTER — Emergency Department (HOSPITAL_COMMUNITY): Payer: Medicaid Other

## 2014-08-04 LAB — LACTIC ACID, PLASMA: LACTIC ACID, VENOUS: 2 mmol/L (ref 0.5–2.2)

## 2014-08-04 MED ORDER — OXYCODONE-ACETAMINOPHEN 5-325 MG PO TABS
1.0000 | ORAL_TABLET | Freq: Once | ORAL | Status: AC
  Administered 2014-08-04: 1 via ORAL
  Filled 2014-08-04: qty 1

## 2014-08-04 MED ORDER — OXYCODONE-ACETAMINOPHEN 5-325 MG PO TABS: 1.0000 | ORAL_TABLET | Freq: Four times a day (QID) | ORAL | Status: DC | PRN

## 2014-08-04 NOTE — ED Provider Notes (Signed)
CSN: [id number]     Arrival date & time 08/03/14  2040 History   First MD Initiated Contact with Patient 08/03/14 2326     Chief Complaint  Patient presents with  . 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 Patient reports right lower quadrant abdominal pain that radiates to his back. He reports associated nausea and diarrhea. He denies any vomiting. Currently he reports his pain is 10 out of 10. He took an oxycodone at home which did not help. Pain is not exacerbated or relieved by anything. He has baseline pain but that has acutely worsened. Pain is consistent with prior episodes of pancreatitis.  Past Medical History  Diagnosis Date  . Hypertension   . Ileus 01/03/2012  . Retroperitoneal bleed 01/04/2012  . Pancreatitis    Past Surgical History  Procedure Laterality Date  . Hernia repair    . Pancreas surgery      half of pancreas removed   No family history on file. History  Substance Use Topics  . Smoking status: Never Smoker   . Smokeless tobacco: Not on file  . Alcohol Use: No     Comment: hx of One drink per day per pt    Review of Systems  Constitutional: Negative.  Negative for fever.  Respiratory: Negative.  Negative for chest tightness and shortness of breath.   Cardiovascular: Negative.  Negative for chest pain.  Gastrointestinal: Positive for nausea, abdominal pain and diarrhea. Negative for vomiting and blood in stool.  Genitourinary: Negative.  Negative for dysuria.  Musculoskeletal: Negative for back pain.  Skin: Negative for rash.  All other systems reviewed and are negative.     Allergies  Ace inhibitors; Coconut flavor; and Iodine  Home Medications   Prior to Admission medications   Medication Sig Start Date End Date  Taking? Authorizing Provider  amitriptyline (ELAVIL) 25 MG tablet Take 25 mg by mouth at bedtime. 10/19/12   Historical Provider, MD  aspirin-acetaminophen-caffeine (EXCEDRIN EXTRA STRENGTH) [id number] MG per tablet Take 2 tablets by mouth daily as needed. Take 2 tablets by mouth as needed for pain    Historical Provider, MD  hydrochlorothiazide (HYDRODIURIL) 25 MG tablet Take 25 mg by mouth every morning. Take 1 tablet (25 mg total) by mouth daily. 11/12/12 11/12/13  Historical Provider, MD  iron polysaccharides (NIFEREX) 150 MG capsule Take 150 mg by mouth 2 (two) times daily. 02/23/12 02/22/13  Historical Provider, MD  metoprolol (LOPRESSOR) 50 MG tablet Take 50 mg by mouth 2 (two) times daily. Take 1 tablet (50 mg total) by mouth 2 times daily. 08/08/12   Historical Provider, MD  oxyCODONE (ROXICODONE) 15 MG immediate release tablet Take 15 mg by mouth every 6 (six) hours as needed. For pain 11/03/12   Historical Provider, MD  oxyCODONE (ROXICODONE) 15 MG immediate release tablet Take 1 tablet (15 mg total) by mouth every 4 (four) hours as needed for pain. 11/21/12   Hurman HornJohn M Bednar, MD  oxyCODONE-acetaminophen (PERCOCET/ROXICET) 5-325 MG per tablet Take 1 tablet by mouth every 6 (six) hours as needed for severe pain. 08/04/14   Shon Batonourtney F Horton, MD  vitamin B-12 (CYANOCOBALAMIN) 1000 MCG tablet Take by mouth 2 (two) times a week.    Historical Provider, MD   BP 129/76  Pulse 97  Temp(Src) 99.5 F (37.5 C) (Oral)  Resp 20  Ht  5\' 10"  (1.778 m)  Wt 252 lb (114.306 kg)  BMI 36.16 kg/m2  SpO2 99% Physical Exam  Nursing note and vitals reviewed. Constitutional: He is oriented to person, place, and time. He appears well-developed and well-nourished. No distress.  HENT:  Head: Normocephalic and atraumatic.  Cardiovascular: Normal rate, regular rhythm and normal heart sounds.   No murmur heard. Pulmonary/Chest: Effort normal and breath sounds normal. No respiratory distress. He has no wheezes.   Abdominal: Soft. Bowel sounds are normal. He exhibits no distension. There is no tenderness. There is no rebound.  Extensive abdominal scarring, no significant tenderness, ostomy noted in the right midabdomen with pink stoma, minimal output  Musculoskeletal: He exhibits no edema.  Neurological: He is alert and oriented to person, place, and time.  Skin: Skin is warm and dry.  Psychiatric: He has a normal mood and affect.    ED Course  Procedures (including critical care time) Labs Review Labs Reviewed  CBC WITH DIFFERENTIAL - Abnormal; Notable for the following:    WBC 3.7 (*)    Hemoglobin 11.9 (*)    HCT 35.4 (*)    Platelets 145 (*)    All other components within normal limits  BASIC METABOLIC PANEL - Abnormal; Notable for the following:    Glucose, Bld 184 (*)    Creatinine, Ser 1.69 (*)    GFR calc non Af Amer 47 (*)    GFR calc Af Amer 55 (*)    All other components within normal limits  AMYLASE  LIPASE, BLOOD  LACTIC ACID, PLASMA    Imaging Review Dg Abd 1 View  08/04/2014   CLINICAL DATA:  Abdominal pain.  EXAM: ABDOMEN - 1 VIEW  COMPARISON:  01/07/2012  FINDINGS: Ostomy noted on the right. There is a gas and stool distended loop of bowel in the left abdomen. The overall bowel gas pattern is nonobstructive. No concerning intra-abdominal mass effect or calcification. Mild elevation of the right diaphragm. IVC filter.  IMPRESSION: 1. Bowel gas pattern is overall nonobstructive. 2. Colon in the left abdomen is distended by gas and stool.   Electronically Signed   By: Tiburcio PeaJonathan  Watts M.D.   On: 08/04/2014 01:01     EKG Interpretation None      MDM   Final diagnoses:  Abdominal pain    Patient presents with abdominal pain. Nontoxic on exam. No significant tenderness. Patient does have history of pancreatitis and extensive abdominal surgeries. Lab work sent from triage is reassuring including a lipase and lactate. KUB obtained to evaluate for air-fluid levels. KUB is  reassuring. Patient was given pain and nausea medication. On recheck, abdominal exam continues to be benign. Discussed with patient conservative treatment at home with pain medication. Patient stated understanding.  After history, exam, and medical workup I feel the patient has been appropriately medically screened and is safe for discharge home. Pertinent diagnoses were discussed with the patient. Patient was given return precautions.       Shon Batonourtney F Horton, MD 08/04/14 [phone]

## 2014-08-04 NOTE — Discharge Instructions (Signed)
You were seen today for abdominal pain. The cause of your pain is unknown at this time. Your workup is reassuring at this time.  See return precautions below.  Abdominal Pain Many things can cause abdominal pain. Usually, abdominal pain is not caused by a disease and will improve without treatment. It can often be observed and treated at home. Your health care provider will do a physical exam and possibly order blood tests and X-rays to help determine the seriousness of your pain. However, in many cases, more time must pass before a clear cause of the pain can be found. Before that point, your health care provider may not know if you need more testing or further treatment. HOME CARE INSTRUCTIONS  Monitor your abdominal pain for any changes. The following actions may help to alleviate any discomfort you are experiencing:  Only take over-the-counter or prescription medicines as directed by your health care provider.  Do not take laxatives unless directed to do so by your health care provider.  Try a clear liquid diet (broth, tea, or water) as directed by your health care provider. Slowly move to a bland diet as tolerated. SEEK MEDICAL CARE IF:  You have unexplained abdominal pain.  You have abdominal pain associated with nausea or diarrhea.  You have pain when you urinate or have a bowel movement.  You experience abdominal pain that wakes you in the night.  You have abdominal pain that is worsened or improved by eating food.  You have abdominal pain that is worsened with eating fatty foods.  You have a fever. SEEK IMMEDIATE MEDICAL CARE IF:   Your pain does not go away within 2 hours.  You keep throwing up (vomiting).  Your pain is felt only in portions of the abdomen, such as the right side or the left lower portion of the abdomen.  You pass bloody or black tarry stools. MAKE SURE YOU:  Understand these instructions.   Will watch your condition.   Will get help right away  if you are not doing well or get worse.  Document Released: 07/04/2005 Document Revised: 09/29/2013 Document Reviewed: 06/03/2013 [facility] Patient Information 2015 [employer] [place]. This information is not intended to replace advice given to you by your health care provider. Make sure you discuss any questions you have with your health care provider.

## 2015-03-02 ENCOUNTER — Emergency Department (HOSPITAL_COMMUNITY): Payer: Medicare Other

## 2015-03-02 ENCOUNTER — Encounter (HOSPITAL_COMMUNITY): Payer: Self-pay | Admitting: Emergency Medicine

## 2015-03-02 ENCOUNTER — Emergency Department (HOSPITAL_COMMUNITY)
Admission: EM | Admit: 2015-03-02 | Discharge: 2015-03-02 | Disposition: A | Discharge status: Home / Self Care | Payer: Medicare Other | Attending: Emergency Medicine | Admitting: Emergency Medicine

## 2015-03-02 DIAGNOSIS — Z8719 Personal history of other diseases of the digestive system: Secondary | ICD-10-CM | POA: Diagnosis not present

## 2015-03-02 DIAGNOSIS — I10 Essential (primary) hypertension: Secondary | ICD-10-CM | POA: Insufficient documentation

## 2015-03-02 DIAGNOSIS — Z79899 Other long term (current) drug therapy: Secondary | ICD-10-CM | POA: Diagnosis not present

## 2015-03-02 DIAGNOSIS — Z9889 Other specified postprocedural states: Secondary | ICD-10-CM | POA: Diagnosis not present

## 2015-03-02 DIAGNOSIS — R1031 Right lower quadrant pain: Secondary | ICD-10-CM | POA: Diagnosis present

## 2015-03-02 DIAGNOSIS — R112 Nausea with vomiting, unspecified: Secondary | ICD-10-CM | POA: Diagnosis not present

## 2015-03-02 DIAGNOSIS — R109 Unspecified abdominal pain: Secondary | ICD-10-CM

## 2015-03-02 LAB — COMPREHENSIVE METABOLIC PANEL
ALBUMIN: 4.3 g/dL (ref 3.5–5.0)
ALT: 25 U/L (ref 17–63)
AST: 25 U/L (ref 15–41)
Alkaline Phosphatase: 97 U/L (ref 38–126)
Anion gap: 8 (ref 5–15)
BUN: 21 mg/dL — AB (ref 6–20)
CO2: 21 mmol/L — ABNORMAL LOW (ref 22–32)
Calcium: 9.1 mg/dL (ref 8.9–10.3)
Chloride: 106 mmol/L (ref 101–111)
Creatinine, Ser: 1.83 mg/dL — ABNORMAL HIGH (ref 0.61–1.24)
GFR, EST AFRICAN AMERICAN: 49 mL/min — AB (ref >60)
GFR, EST NON AFRICAN AMERICAN: 43 mL/min — AB (ref >60)
GLUCOSE: 117 mg/dL — AB (ref 65–99)
POTASSIUM: 3.8 mmol/L (ref 3.5–5.1)
Sodium: 135 mmol/L (ref 135–145)
Total Bilirubin: 0.4 mg/dL (ref 0.3–1.2)
Total Protein: 7.9 g/dL (ref 6.5–8.1)

## 2015-03-02 LAB — CBC WITH DIFFERENTIAL/PLATELET
BASOS ABS: 0 10*3/uL (ref 0.0–0.1)
Basophils Relative: 1 % (ref 0–1)
Eosinophils Absolute: 0.2 10*3/uL (ref 0.0–0.7)
Eosinophils Relative: 5 % (ref 0–5)
HCT: 35.3 % — ABNORMAL LOW (ref 39.0–52.0)
HEMOGLOBIN: 11.3 g/dL — AB (ref 13.0–17.0)
LYMPHS ABS: 1.5 10*3/uL (ref 0.7–4.0)
Lymphocytes Relative: 36 % (ref 12–46)
MCH: 26.7 pg (ref 26.0–34.0)
MCHC: 32 g/dL (ref 30.0–36.0)
MCV: 83.3 fL (ref 78.0–100.0)
Monocytes Absolute: 0.3 10*3/uL (ref 0.1–1.0)
Monocytes Relative: 8 % (ref 3–12)
NEUTROS PCT: 50 % (ref 43–77)
Neutro Abs: 2.1 10*3/uL (ref 1.7–7.7)
PLATELETS: 185 10*3/uL (ref 150–400)
RBC: 4.24 MIL/uL (ref 4.22–5.81)
RDW: 15.4 % (ref 11.5–15.5)
WBC: 4.1 10*3/uL (ref 4.0–10.5)

## 2015-03-02 LAB — LIPASE, BLOOD: Lipase: 29 U/L (ref 22–51)

## 2015-03-02 MED ORDER — ONDANSETRON 4 MG PO TBDP: 4.0000 mg | ORAL_TABLET | Freq: Three times a day (TID) | ORAL | Status: DC | PRN

## 2015-03-02 MED ORDER — PANTOPRAZOLE SODIUM 40 MG IV SOLR
40.0000 mg | Freq: Once | INTRAVENOUS | Status: AC
  Administered 2015-03-02: 40 mg via INTRAVENOUS
  Filled 2015-03-02: qty 40

## 2015-03-02 MED ORDER — HYDROMORPHONE HCL 1 MG/ML IJ SOLN
1.0000 mg | INTRAMUSCULAR | Status: AC | PRN
  Administered 2015-03-02 (×2): 1 mg via INTRAVENOUS
  Filled 2015-03-02 (×2): qty 1

## 2015-03-02 MED ORDER — SODIUM CHLORIDE 0.9 % IV SOLN
Freq: Once | INTRAVENOUS | Status: AC
  Administered 2015-03-02: 20:00:00 via INTRAVENOUS

## 2015-03-02 MED ORDER — ONDANSETRON HCL 4 MG/2ML IJ SOLN
4.0000 mg | Freq: Once | INTRAMUSCULAR | Status: AC
  Administered 2015-03-02: 4 mg via INTRAVENOUS
  Filled 2015-03-02: qty 2

## 2015-03-02 MED ORDER — SODIUM CHLORIDE 0.9 % IV BOLUS (SEPSIS)
1000.0000 mL | Freq: Once | INTRAVENOUS | Status: AC
  Administered 2015-03-02: 1000 mL via INTRAVENOUS

## 2015-03-02 MED ORDER — HYDROCODONE-ACETAMINOPHEN 5-325 MG PO TABS: 2.0000 | ORAL_TABLET | ORAL | Status: DC | PRN

## 2015-03-02 NOTE — Discharge Instructions (Signed)
No new, or acute abnormalities noted on your testing today. [facility]  Chronic Pain Chronic pain can be defined as pain that is off and on and lasts for 3-6 months or longer. Many things cause chronic pain, which can make it difficult to make a diagnosis. There are many treatment options available for chronic pain. However, finding a treatment that works well for you may require trying various approaches until the right one is found. Many people benefit from a combination of two or more types of treatment to control their pain. SYMPTOMS  Chronic pain can occur anywhere in the body and can range from mild to very severe. Some types of chronic pain include:  Headache.  Low back pain.  Cancer pain.  Arthritis pain.  Neurogenic pain. This is pain resulting from damage to nerves. People with chronic pain may also have other symptoms such as:  Depression.  Anger.  Insomnia.  Anxiety. DIAGNOSIS  Your health care provider will help diagnose your condition over time. In many cases, the initial focus will be on excluding possible conditions that could be causing the pain. Depending on your symptoms, your health care provider may order tests to diagnose your condition. Some of these tests may include:   Blood tests.   CT scan.   MRI.   X-rays.   Ultrasounds.   Nerve conduction studies.  You may need to see a specialist.  TREATMENT  Finding treatment that works well may take time. You may be referred to a pain specialist. He or she may prescribe medicine or therapies, such as:   Mindful meditation or yoga.  Shots (injections) of numbing or pain-relieving medicines into the spine or area of pain.  Local electrical stimulation.  Acupuncture.   Massage therapy.   Aroma, color, light, or sound therapy.   Biofeedback.   Working with a physical therapist to keep from getting stiff.   Regular,  gentle exercise.   Cognitive or behavioral therapy.   Group support.  Sometimes, surgery may be recommended.  HOME CARE INSTRUCTIONS   Take all medicines as directed by your health care provider.   Lessen stress in your life by relaxing and doing things such as listening to calming music.   Exercise or be active as directed by your health care provider.   Eat a healthy diet and include things such as vegetables, fruits, fish, and lean meats in your diet.   Keep all follow-up appointments with your health care provider.   Attend a support group with others suffering from chronic pain. SEEK MEDICAL CARE IF:   Your pain gets worse.   You develop a new pain that was not there before.   You cannot tolerate medicines given to you by your health care provider.   You have new symptoms since your last visit with your health care provider.  SEEK IMMEDIATE MEDICAL CARE IF:   You feel weak.   You have decreased sensation or numbness.   You lose control of bowel or bladder function.   Your pain suddenly gets much worse.   You develop shaking.  You develop chills.  You develop confusion.  You develop chest pain.  You develop shortness of breath.  MAKE SURE YOU:  Understand these instructions.  Will watch your condition.  Will get help right away if you are not doing well or get worse. Document Released: 06/16/2002 Document Revised: 05/27/2013 Document Reviewed: 03/20/2013 [employer] Patient Information 2015  [employer]. This information is not intended to replace advice given to you by your health care provider. Make sure you discuss any questions you have with your health care provider.

## 2015-03-02 NOTE — ED Provider Notes (Signed)
CSN: [id number]     Arrival date & time 03/02/15  1725 History   First MD Initiated Contact with Patient 03/02/15 1748     Chief Complaint  Patient presents with  . Abdominal Pain      HPI  Patient presents for evaluation of a flare of abdominal pain. He has a three-year history of abdominal pain. Had episode of hemorrhagic pancreatitis. 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 He states he still passes gas and has bowel movement rectally. She did so today. He is continuing to have mucus into his right lower quadrant bag. He feels distended in his abdomen and was nauseated. No fevers or chills. No blood in his emesis, bag, or with bowel movements.  Past Medical History  Diagnosis Date  . Hypertension   . Ileus 01/03/2012  . Retroperitoneal bleed 01/04/2012  . Pancreatitis    Past Surgical History  Procedure Laterality Date  . Hernia repair    . Pancreas surgery      half of pancreas removed   History reviewed. No pertinent family history. History  Substance Use Topics  . Smoking status: Never Smoker   . Smokeless tobacco: Not on file  . Alcohol Use: No     Comment: hx of One drink per day per pt    Review of Systems  Constitutional: Negative for fever, chills, diaphoresis, appetite change and fatigue.  HENT: Negative for mouth sores, sore throat and trouble swallowing.   Eyes: Negative for visual disturbance.  Respiratory: Negative for cough, chest tightness, shortness of breath and wheezing.   Cardiovascular: Negative for chest pain.  Gastrointestinal: Positive for nausea, vomiting and abdominal pain. Negative for diarrhea and abdominal distention.  Endocrine: Negative for polydipsia, polyphagia and polyuria.  Genitourinary: Negative for dysuria, frequency and hematuria.  Musculoskeletal: Negative for gait problem.   Skin: Negative for color change, pallor and rash.  Neurological: Negative for dizziness, syncope, light-headedness and headaches.  Hematological: Does not bruise/bleed easily.  Psychiatric/Behavioral: Negative for behavioral problems and confusion.      Allergies  Ace inhibitors; Coconut flavor; and Iodine  Home Medications   Prior to Admission medications   Medication Sig Start Date End Date Taking? Authorizing Provider  amitriptyline (ELAVIL) 150 MG tablet Take 150 mg by mouth at bedtime.  11/30/14  Yes Historical Provider, MD  CREON 36000 UNITS CPEP capsule Take 36,000-108,000 Units by mouth 3 (three) times daily with meals. 1 with snacks and 3 with meals 12/11/14  Yes Historical Provider, MD  hydrochlorothiazide (HYDRODIURIL) 25 MG tablet Take 25 mg by mouth every morning. Take 1 tablet (25 mg total) by mouth daily. 11/12/12 03/02/15 Yes Historical Provider, MD  iron polysaccharides (NIFEREX) 150 MG capsule Take 150 mg by mouth 2 (two) times daily. 02/23/12 03/02/15 Yes Historical Provider, MD  magnesium oxide (MAG-OX) 400 (241.3 MG) MG tablet Take 1 tablet by mouth 2 (two) times daily. 01/13/15  Yes Historical Provider, MD  metoprolol (LOPRESSOR) 50 MG tablet Take 50 mg by mouth 2 (two) times daily. Take 1 tablet (50 mg total) by mouth 2 times daily. 08/08/12  Yes Historical Provider, MD  Oxycodone HCl 10 MG TABS Take 10-20 mg by mouth every 4 (four) hours as needed. For pain 01/21/15  Yes Historical Provider, MD  topiramate (TOPAMAX) 25 MG tablet Take 50 mg by mouth 2 (two) times daily. 02/10/15  Yes Historical Provider, MD  vitamin B-12 (CYANOCOBALAMIN) 1000 MCG tablet Take by mouth 2 (two) times a week.   Yes Historical Provider, MD  oxyCODONE (ROXICODONE) 15 MG immediate release tablet Take 1 tablet (15 mg total) by mouth every 4 (four) hours as needed for pain. Patient not taking: Reported on 03/02/2015 11/21/12   Wayland Salinas, MD  oxyCODONE-acetaminophen (PERCOCET/ROXICET) 5-325 MG per tablet  Take 1 tablet by mouth every 6 (six) hours as needed for severe pain. Patient not taking: Reported on 03/02/2015 08/04/14   Shon Baton, MD   BP 111/82 mmHg  Pulse 67  Temp(Src) 98 F (36.7 C) (Oral)  Resp 14  Ht  (1.778 m)  Wt 142 lb (64.411 kg)  BMI 20.37 kg/m2  SpO2 100% Physical Exam  Constitutional: He is oriented to person, place, and time. He appears well-developed and well-nourished. No distress.  HENT:  Head: Normocephalic.  Eyes: Conjunctivae are normal. Pupils are equal, round, and reactive to light. No scleral icterus.  Neck: Normal range of motion. Neck supple. No thyromegaly present.  Cardiovascular: Normal rate and regular rhythm.  Exam reveals no gallop and no friction rub.   No murmur heard. Pulmonary/Chest: Effort normal and breath sounds normal. No respiratory distress. He has no wheezes. He has no rales.  Abdominal: Soft. Bowel sounds are normal. He exhibits no distension. There is no tenderness. There is no rebound.    Slight diffuse hypoactive bowel sounds. No high-pitched rushes. Diffuse tenderness without peritoneal irritation or rigidity.  Musculoskeletal: Normal range of motion.  Neurological: He is alert and oriented to person, place, and time.  Skin: Skin is warm and dry. No rash noted.  Psychiatric: He has a normal mood and affect. His behavior is normal.    ED Course  Procedures (including critical care time) Labs Review Labs Reviewed  CBC WITH DIFFERENTIAL/PLATELET - Abnormal; Notable for the following:    Hemoglobin 11.3 (*)    HCT 35.3 (*)    All other components within normal limits  COMPREHENSIVE METABOLIC PANEL - Abnormal; Notable for the following:    CO2 21 (*)    Glucose, Bld 117 (*)    BUN 21 (*)    Creatinine, Ser 1.83 (*)    GFR calc non Af Amer 43 (*)    GFR calc Af Amer 49 (*)    All other components within normal limits  LIPASE, BLOOD    Imaging Review Dg Abd Acute W/chest  03/02/2015   CLINICAL DATA:  One  week history of acute superimposed upon chronic right-sided abdominal pain associated with diarrhea. Prior history of hemorrhagic pancreatitis in 2014. Prior ascending colostomy.  EXAM: DG ABDOMEN ACUTE W/ 1V CHEST  COMPARISON:  Abdominal x-ray 08/04/2014. CT abdomen and pelvis 01/09/2014. Chest x-rays 11/21/2012 and earlier. CTA chest 10/12/2009.  FINDINGS: Gaseous distention of colon in the mid and left side of the upper abdomen, without evidence of colonic or small bowel distention elsewhere. No air-fluid levels on the erect image. No free intraperitoneal air. Stoma in the right mid abdomen. IVC filter. Regional skeleton intact.  Stable chronic mild elevation of the left hemidiaphragm with chronic scar/atelectasis in the left lung base. Apparent nodule in the left perihilar region shown on prior CT to represent mildly dilated left upper lobe pulmonary vein. Lungs otherwise clear. No localized airspace consolidation. No pleural effusions. No pneumothorax. Normal pulmonary vascularity.  IMPRESSION: 1. Gaseous distension of a loop of bowel which I believe is the transverse colon, not associated  with air-fluid levels and not associated with colonic or small bowel distention elsewhere. Localized ileus is favored. However, if this patient with an ascending colostomy still has his cecum, a nonobstructing cecal volvulus might have this appearance. Please correlate with prior surgery. 2. No acute abdominal abnormality otherwise. 3. Stable mild chronic elevation of the left hemidiaphragm and chronic scar/atelectasis at the left lung base. No acute cardiopulmonary disease.   Electronically Signed   By: Hulan Saashomas  Lawrence M.D.   On: 03/02/2015 18:55     EKG Interpretation None      MDM   Final diagnoses:  AP (abdominal pain)    Initial x-rays show dilated bowel. Surfaces small bowel or large bowel. On reexam he states he is feeling "much better". CT scan requested and pending.  CT shows no acute findings. He  continues to feel well. Tolerating the by mouth contrast as well as an additional by mouth fluids. Plan is discharged home.    Rolland PorterMark Nikhita Mentzel, MD 03/02/15 2222

## 2015-03-02 NOTE — ED Notes (Signed)
Patient with no complaints at this time. Respirations even and unlabored. Skin warm/dry. Discharge instructions reviewed with patient at this time. Patient given opportunity to voice concerns/ask questions. IV removed per policy and band-aid applied to site. Patient discharged at this time and left Emergency Department with steady gait.  

## 2015-03-02 NOTE — ED Notes (Addendum)
Pt reports chronic abdominal pain flare-up and dizziness x1 week.pt reports headache with light sensitivity since this am upon waking up. Pt reports was at PCP office and was told "blood pressure was elevated." nad noted.

## 2015-04-07 ENCOUNTER — Encounter (HOSPITAL_COMMUNITY): Payer: Self-pay | Admitting: Emergency Medicine

## 2015-04-07 ENCOUNTER — Emergency Department (HOSPITAL_COMMUNITY)
Admission: EM | Admit: 2015-04-07 | Discharge: 2015-04-07 | Disposition: A | Discharge status: Home / Self Care | Payer: Medicare Other | Attending: Emergency Medicine | Admitting: Emergency Medicine

## 2015-04-07 DIAGNOSIS — G8929 Other chronic pain: Secondary | ICD-10-CM | POA: Diagnosis not present

## 2015-04-07 DIAGNOSIS — R109 Unspecified abdominal pain: Secondary | ICD-10-CM

## 2015-04-07 DIAGNOSIS — R6883 Chills (without fever): Secondary | ICD-10-CM | POA: Diagnosis not present

## 2015-04-07 DIAGNOSIS — Z79899 Other long term (current) drug therapy: Secondary | ICD-10-CM | POA: Insufficient documentation

## 2015-04-07 DIAGNOSIS — R51 Headache: Secondary | ICD-10-CM | POA: Insufficient documentation

## 2015-04-07 DIAGNOSIS — R05 Cough: Secondary | ICD-10-CM | POA: Insufficient documentation

## 2015-04-07 DIAGNOSIS — R197 Diarrhea, unspecified: Secondary | ICD-10-CM | POA: Diagnosis not present

## 2015-04-07 DIAGNOSIS — R110 Nausea: Secondary | ICD-10-CM | POA: Insufficient documentation

## 2015-04-07 DIAGNOSIS — Z8719 Personal history of other diseases of the digestive system: Secondary | ICD-10-CM | POA: Insufficient documentation

## 2015-04-07 DIAGNOSIS — R61 Generalized hyperhidrosis: Secondary | ICD-10-CM | POA: Diagnosis not present

## 2015-04-07 DIAGNOSIS — Z87442 Personal history of urinary calculi: Secondary | ICD-10-CM | POA: Insufficient documentation

## 2015-04-07 DIAGNOSIS — Z9889 Other specified postprocedural states: Secondary | ICD-10-CM | POA: Insufficient documentation

## 2015-04-07 DIAGNOSIS — Z87448 Personal history of other diseases of urinary system: Secondary | ICD-10-CM | POA: Diagnosis not present

## 2015-04-07 DIAGNOSIS — I10 Essential (primary) hypertension: Secondary | ICD-10-CM | POA: Insufficient documentation

## 2015-04-07 DIAGNOSIS — R1012 Left upper quadrant pain: Secondary | ICD-10-CM | POA: Diagnosis present

## 2015-04-07 LAB — CBC WITH DIFFERENTIAL/PLATELET
BASOS PCT: 1 % (ref 0–1)
Basophils Absolute: 0 10*3/uL (ref 0.0–0.1)
Eosinophils Absolute: 0.1 10*3/uL (ref 0.0–0.7)
Eosinophils Relative: 2 % (ref 0–5)
HEMATOCRIT: 40.3 % (ref 39.0–52.0)
HEMOGLOBIN: 13.1 g/dL (ref 13.0–17.0)
LYMPHS PCT: 35 % (ref 12–46)
Lymphs Abs: 1.5 10*3/uL (ref 0.7–4.0)
MCH: 26.6 pg (ref 26.0–34.0)
MCHC: 32.5 g/dL (ref 30.0–36.0)
MCV: 81.9 fL (ref 78.0–100.0)
Monocytes Absolute: 0.4 10*3/uL (ref 0.1–1.0)
Monocytes Relative: 9 % (ref 3–12)
Neutro Abs: 2.3 10*3/uL (ref 1.7–7.7)
Neutrophils Relative %: 53 % (ref 43–77)
PLATELETS: 163 10*3/uL (ref 150–400)
RBC: 4.92 MIL/uL (ref 4.22–5.81)
RDW: 15 % (ref 11.5–15.5)
WBC: 4.3 10*3/uL (ref 4.0–10.5)

## 2015-04-07 LAB — COMPREHENSIVE METABOLIC PANEL
ALBUMIN: 4.5 g/dL (ref 3.5–5.0)
ALT: 26 U/L (ref 17–63)
AST: 24 U/L (ref 15–41)
Alkaline Phosphatase: 94 U/L (ref 38–126)
Anion gap: 11 (ref 5–15)
BUN: 17 mg/dL (ref 6–20)
CALCIUM: 8.9 mg/dL (ref 8.9–10.3)
CHLORIDE: 105 mmol/L (ref 101–111)
CO2: 21 mmol/L — AB (ref 22–32)
Creatinine, Ser: 1.55 mg/dL — ABNORMAL HIGH (ref 0.61–1.24)
GFR calc non Af Amer: 52 mL/min — ABNORMAL LOW (ref >60)
Glucose, Bld: 110 mg/dL — ABNORMAL HIGH (ref 65–99)
Potassium: 3.8 mmol/L (ref 3.5–5.1)
SODIUM: 137 mmol/L (ref 135–145)
Total Bilirubin: 0.9 mg/dL (ref 0.3–1.2)
Total Protein: 8.4 g/dL — ABNORMAL HIGH (ref 6.5–8.1)

## 2015-04-07 LAB — LIPASE, BLOOD: Lipase: 20 U/L — ABNORMAL LOW (ref 22–51)

## 2015-04-07 MED ORDER — ONDANSETRON HCL 4 MG/2ML IJ SOLN
4.0000 mg | Freq: Once | INTRAMUSCULAR | Status: AC
  Administered 2015-04-07: 4 mg via INTRAVENOUS
  Filled 2015-04-07: qty 2

## 2015-04-07 MED ORDER — SODIUM CHLORIDE 0.9 % IV BOLUS (SEPSIS)
1000.0000 mL | Freq: Once | INTRAVENOUS | Status: AC
  Administered 2015-04-07: 1000 mL via INTRAVENOUS

## 2015-04-07 MED ORDER — SODIUM CHLORIDE 0.9 % IV SOLN
INTRAVENOUS | Status: DC
  Administered 2015-04-07: 13:00:00 via INTRAVENOUS

## 2015-04-07 MED ORDER — ONDANSETRON 4 MG PO TBDP: 4.0000 mg | ORAL_TABLET | Freq: Three times a day (TID) | ORAL | Status: DC | PRN

## 2015-04-07 MED ORDER — HYDROMORPHONE HCL 1 MG/ML IJ SOLN
1.0000 mg | Freq: Once | INTRAMUSCULAR | Status: AC
  Administered 2015-04-07: 1 mg via INTRAVENOUS
  Filled 2015-04-07: qty 1

## 2015-04-07 MED ORDER — HYDROCODONE-ACETAMINOPHEN 5-325 MG PO TABS: 1.0000 | ORAL_TABLET | Freq: Four times a day (QID) | ORAL | Status: DC | PRN

## 2015-04-07 NOTE — Discharge Instructions (Signed)
Take pain medicine as directed. Take antinausea medicine as directed. Recommend a clear liquid diet for the next 24 hours then a bland diet for the next 24 hours after that. Return for any new or worse symptoms.

## 2015-04-07 NOTE — ED Notes (Signed)
Patient with no complaints at this time. Respirations even and unlabored. Skin warm/dry. Discharge instructions reviewed with patient at this time. Patient given opportunity to voice concerns/ask questions. IV removed per policy and band-aid applied to site. Patient discharged at this time and left Emergency Department with steady gait.  

## 2015-04-07 NOTE — ED Notes (Signed)
MD at bedside. 

## 2015-04-07 NOTE — ED Provider Notes (Signed)
CSN: [id number]     Arrival date & time 04/07/15  0906 History  This chart was scribed for Vanetta MuldersScott Nayib Remer, MD by Placido SouLogan Joldersma, ED scribe. This patient was seen in room [facility] and the patient's care was started at 9:32 AM.     Chief Complaint  Patient presents with  . Abdominal Pain   The history is provided by the patient. No language interpreter was used.    HPI Comments: Peter [name] is a 44 y.o. male, with a history of pancreatitis and a partial pancreatectomy, who presents to the Emergency Department complaining of chronic, mild, abd pain with a worsening of symptoms to his LUQ with onset last night. Pt notes this feels similar to past episodes of pancreatitis and would currently rate his pain as 8/10. He describes the pain as "aching". Pt further notes typically having 5-6 BM's per day with diarrhea beginning 3 days ago and increasing the softness of his stools as well as increasing the frequency to 7-8 per day. He describes his BM's as being "like water". He notes nausea, chills, diaphoresis, cough, and HA as associated symptoms. Pt notes a history of chronic lower back pain and is unsure of its relationship to his symptoms. He notes typically coming to the emergency department when his symptoms worsen. Pt notes having a RLQ fistula with a colostomy bag since 2013 and further notes a surgical history including a partial colectomy. He further notes having a hernia removed. He denies vomiting, fever, visual change, runny nose, sore throat, CP, SOB, dysuria, hematuria, leg swelling, rash, or any history of bleeding easily.   Note from 03/02/2015 Visit: He has a three-year history of abdominal pain. Had episode of hemorrhagic pancreatitis. 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   Past Medical History  Diagnosis Date  .  Hypertension   . Ileus 01/03/2012  . Retroperitoneal bleed 01/04/2012  . Pancreatitis   . Renal disorder     kidney stones   Past Surgical History  Procedure Laterality Date  . Hernia repair    . Pancreas surgery      half of pancreas removed  . Lithotripsy     No family history on file. History  Substance Use Topics  . Smoking status: Never Smoker   . Smokeless tobacco: Not on file  . Alcohol Use: No     Comment: hx of One drink per day per pt    Review of Systems  Constitutional: Positive for chills and diaphoresis. Negative for fever.  HENT: Negative for congestion, rhinorrhea, sneezing and sore throat.   Eyes: Negative for visual disturbance.  Respiratory: Positive for cough. Negative for shortness of breath.   Cardiovascular: Negative for chest pain and leg swelling.  Gastrointestinal: Positive for nausea, abdominal pain and diarrhea. Negative for vomiting.  Genitourinary: Negative for dysuria and hematuria.  Skin: Negative for rash.  Neurological: Positive for headaches.  Psychiatric/Behavioral: Negative for confusion.      Allergies  Ace inhibitors; Coconut flavor; Contrast media; and Iodine  Home Medications   Prior to Admission medications   Medication Sig Start Date End Date Taking? Authorizing Provider  amitriptyline (ELAVIL) 150 MG tablet Take 150 mg by mouth at bedtime.  11/30/14  Yes Historical Provider, MD  CREON 36000 UNITS CPEP capsule Take 36,000-108,000 Units by mouth 3 (three) times daily with meals. 1 with snacks  and 3 with meals 12/11/14  Yes Historical Provider, MD  hydrochlorothiazide (HYDRODIURIL) 25 MG tablet Take 25 mg by mouth every morning. Take 1 tablet (25 mg total) by mouth daily. 11/12/12 04/07/15 Yes Historical Provider, MD  HYDROcodone-acetaminophen (NORCO/VICODIN) 5-325 MG per tablet Take 2 tablets by mouth every 4 (four) hours as needed. 03/02/15  Yes Rolland Porter, MD  iron polysaccharides (NIFEREX) 150 MG capsule Take 150 mg by mouth 2 (two)  times daily. 02/23/12 04/07/15 Yes Historical Provider, MD  magnesium oxide (MAG-OX) 400 (241.3 MG) MG tablet Take 1 tablet by mouth 2 (two) times daily. 01/13/15  Yes Historical Provider, MD  metoprolol (LOPRESSOR) 50 MG tablet Take 50 mg by mouth 2 (two) times daily. Take 1 tablet (50 mg total) by mouth 2 times daily. 08/08/12  Yes Historical Provider, MD  ondansetron (ZOFRAN ODT) 4 MG disintegrating tablet Take 1 tablet (4 mg total) by mouth every 8 (eight) hours as needed for nausea. 03/02/15  Yes Rolland Porter, MD  Oxycodone HCl 10 MG TABS Take 10-20 mg by mouth every 4 (four) hours as needed. For pain 01/21/15  Yes Historical Provider, MD  topiramate (TOPAMAX) 25 MG tablet Take 50 mg by mouth 2 (two) times daily. 02/10/15  Yes Historical Provider, MD  vitamin B-12 (CYANOCOBALAMIN) 1000 MCG tablet Take by mouth 2 (two) times a week.   Yes Historical Provider, MD  HYDROcodone-acetaminophen (NORCO/VICODIN) 5-325 MG per tablet Take 1-2 tablets by mouth every 6 (six) hours as needed. 04/07/15   Vanetta Mulders, MD  ondansetron (ZOFRAN ODT) 4 MG disintegrating tablet Take 1 tablet (4 mg total) by mouth every 8 (eight) hours as needed. 04/07/15   Vanetta Mulders, MD  oxyCODONE-acetaminophen (PERCOCET/ROXICET) 5-325 MG per tablet Take 1 tablet by mouth every 6 (six) hours as needed for severe pain. 08/04/14   Shon Baton, MD   BP 114/92 mmHg  Pulse 75  Temp(Src) 98.1 F (36.7 C) (Oral)  Resp 20  Ht  (1.778 m)  Wt 242 lb (109.77 kg)  BMI 34.72 kg/m2  SpO2 98% Physical Exam  Constitutional: He is oriented to person, place, and time. He appears well-developed and well-nourished. No distress.  HENT:  Head: Normocephalic and atraumatic.  Mouth/Throat: Oropharynx is clear and moist. No oropharyngeal exudate.  Eyes: Conjunctivae and EOM are normal. Pupils are equal, round, and reactive to light. No scleral icterus.  Neck: Normal range of motion. Neck supple. No tracheal deviation present.   Cardiovascular: Normal rate, regular rhythm and normal heart sounds.  Exam reveals no gallop and no friction rub.   No murmur heard. Pulmonary/Chest: Effort normal and breath sounds normal. No respiratory distress. He has no wheezes. He has no rales.  Abdominal: Soft. Bowel sounds are normal. There is no tenderness.  Musculoskeletal: Normal range of motion. He exhibits no edema.  No swelling of ankles bilaterally  Neurological: He is alert and oriented to person, place, and time. No cranial nerve deficit. He exhibits normal muscle tone. Coordination normal.  Skin: Skin is warm and dry.  Psychiatric: He has a normal mood and affect. His behavior is normal.  Nursing note and vitals reviewed.   ED Course  Procedures  COORDINATION OF CARE: 9:41 AM Discussed treatment plan with pt at bedside and pt agreed to plan.  Labs Review Labs Reviewed  LIPASE, BLOOD - Abnormal; Notable for the following:    Lipase 20 (*)    All other components within normal limits  COMPREHENSIVE METABOLIC PANEL - Abnormal; Notable  for the following:    CO2 21 (*)    Glucose, Bld 110 (*)    Creatinine, Ser 1.55 (*)    Total Protein 8.4 (*)    GFR calc non Af Amer 52 (*)    All other components within normal limits  CBC WITH DIFFERENTIAL/PLATELET   Results for orders placed or performed during the hospital encounter of 04/07/15  Lipase, blood  Result Value Ref Range   Lipase 20 (L) 22 - 51 U/L  Comprehensive metabolic panel  Result Value Ref Range   Sodium 137 135 - 145 mmol/L   Potassium 3.8 3.5 - 5.1 mmol/L   Chloride 105 101 - 111 mmol/L   CO2 21 (L) 22 - 32 mmol/L   Glucose, Bld 110 (H) 65 - 99 mg/dL   BUN 17 6 - 20 mg/dL   Creatinine, Ser 1.61 (H) 0.61 - 1.24 mg/dL   Calcium 8.9 8.9 - 09.6 mg/dL   Total Protein 8.4 (H) 6.5 - 8.1 g/dL   Albumin 4.5 3.5 - 5.0 g/dL   AST 24 15 - 41 U/L   ALT 26 17 - 63 U/L   Alkaline Phosphatase 94 38 - 126 U/L   Total Bilirubin 0.9 0.3 - 1.2 mg/dL   GFR calc  non Af Amer 52 (L) >60 mL/min   GFR calc Af Amer >60 >60 mL/min   Anion gap 11 5 - 15  CBC with Differential/Platelet  Result Value Ref Range   WBC 4.3 4.0 - 10.5 K/uL   RBC 4.92 4.22 - 5.81 MIL/uL   Hemoglobin 13.1 13.0 - 17.0 g/dL   HCT 04.5 40.9 - 81.1 %   MCV 81.9 78.0 - 100.0 fL   MCH 26.6 26.0 - 34.0 pg   MCHC 32.5 30.0 - 36.0 g/dL   RDW 91.4 78.2 - 95.6 %   Platelets 163 150 - 400 K/uL   Neutrophils Relative % 53 43 - 77 %   Neutro Abs 2.3 1.7 - 7.7 K/uL   Lymphocytes Relative 35 12 - 46 %   Lymphs Abs 1.5 0.7 - 4.0 K/uL   Monocytes Relative 9 3 - 12 %   Monocytes Absolute 0.4 0.1 - 1.0 K/uL   Eosinophils Relative 2 0 - 5 %   Eosinophils Absolute 0.1 0.0 - 0.7 K/uL   Basophils Relative 1 0 - 1 %   Basophils Absolute 0.0 0.0 - 0.1 K/uL    Imaging Review No results found.   EKG Interpretation None     No results found.   MDM   Final diagnoses:  Abdominal pain, unspecified abdominal location    Patient with a history of chronic pancreatitis. Patient with complications from pancreatitis. Patient seen the end of May with similar complaints had CT scan done at that time without any significant findings. Patient's labs here today without any acute abnormalities. Patient improved with with pain medicine and antinausea medicine. Will give a short course of pain medicine antinausea medicine for home. Work note not needed. Patient's mucous fistula on his right side eyes appears healthy no complicating factors.    I personally performed the services described in this documentation, which was scribed in my presence. The recorded information has been reviewed and is accurate.     Vanetta Mulders, MD 04/07/15 1309

## 2015-04-07 NOTE — ED Notes (Signed)
Patient states daily/chronic abdominal pain that is worse today. H/o pancreatitis. Nausea, no vomiting. +diarrhea

## 2015-05-16 ENCOUNTER — Emergency Department (HOSPITAL_COMMUNITY)
Admission: EM | Admit: 2015-05-16 | Discharge: 2015-05-16 | Disposition: A | Discharge status: Home / Self Care | Payer: Medicare Other | Attending: Emergency Medicine | Admitting: Emergency Medicine

## 2015-05-16 ENCOUNTER — Emergency Department (HOSPITAL_COMMUNITY): Payer: Medicare Other

## 2015-05-16 ENCOUNTER — Encounter (HOSPITAL_COMMUNITY): Payer: Self-pay | Admitting: Emergency Medicine

## 2015-05-16 DIAGNOSIS — Z87442 Personal history of urinary calculi: Secondary | ICD-10-CM | POA: Insufficient documentation

## 2015-05-16 DIAGNOSIS — I10 Essential (primary) hypertension: Secondary | ICD-10-CM | POA: Diagnosis not present

## 2015-05-16 DIAGNOSIS — K529 Noninfective gastroenteritis and colitis, unspecified: Secondary | ICD-10-CM | POA: Insufficient documentation

## 2015-05-16 DIAGNOSIS — Z79899 Other long term (current) drug therapy: Secondary | ICD-10-CM | POA: Diagnosis not present

## 2015-05-16 DIAGNOSIS — R1084 Generalized abdominal pain: Secondary | ICD-10-CM | POA: Diagnosis present

## 2015-05-16 LAB — URINALYSIS, ROUTINE W REFLEX MICROSCOPIC
BILIRUBIN URINE: NEGATIVE
Glucose, UA: NEGATIVE mg/dL
Hgb urine dipstick: NEGATIVE
KETONES UR: NEGATIVE mg/dL
Leukocytes, UA: NEGATIVE
NITRITE: NEGATIVE
Protein, ur: NEGATIVE mg/dL
Urobilinogen, UA: 0.2 mg/dL (ref 0.0–1.0)
pH: 6 (ref 5.0–8.0)

## 2015-05-16 LAB — CBC WITH DIFFERENTIAL/PLATELET
BASOS ABS: 0 10*3/uL (ref 0.0–0.1)
BASOS PCT: 0 % (ref 0–1)
EOS ABS: 0.1 10*3/uL (ref 0.0–0.7)
Eosinophils Relative: 3 % (ref 0–5)
HCT: 38.8 % — ABNORMAL LOW (ref 39.0–52.0)
Hemoglobin: 12.6 g/dL — ABNORMAL LOW (ref 13.0–17.0)
LYMPHS ABS: 1.3 10*3/uL (ref 0.7–4.0)
Lymphocytes Relative: 37 % (ref 12–46)
MCH: 26.6 pg (ref 26.0–34.0)
MCHC: 32.5 g/dL (ref 30.0–36.0)
MCV: 81.9 fL (ref 78.0–100.0)
Monocytes Absolute: 0.3 10*3/uL (ref 0.1–1.0)
Monocytes Relative: 8 % (ref 3–12)
NEUTROS PCT: 52 % (ref 43–77)
Neutro Abs: 1.9 10*3/uL (ref 1.7–7.7)
PLATELETS: 135 10*3/uL — AB (ref 150–400)
RBC: 4.74 MIL/uL (ref 4.22–5.81)
RDW: 14.7 % (ref 11.5–15.5)
WBC: 3.6 10*3/uL — ABNORMAL LOW (ref 4.0–10.5)

## 2015-05-16 LAB — COMPREHENSIVE METABOLIC PANEL
ALT: 24 U/L (ref 17–63)
AST: 30 U/L (ref 15–41)
Albumin: 4.6 g/dL (ref 3.5–5.0)
Alkaline Phosphatase: 97 U/L (ref 38–126)
Anion gap: 8 (ref 5–15)
BUN: 21 mg/dL — AB (ref 6–20)
CO2: 25 mmol/L (ref 22–32)
CREATININE: 1.8 mg/dL — AB (ref 0.61–1.24)
Calcium: 9.1 mg/dL (ref 8.9–10.3)
Chloride: 106 mmol/L (ref 101–111)
GFR calc Af Amer: 50 mL/min — ABNORMAL LOW (ref >60)
GFR calc non Af Amer: 43 mL/min — ABNORMAL LOW (ref >60)
Glucose, Bld: 93 mg/dL (ref 65–99)
POTASSIUM: 4.2 mmol/L (ref 3.5–5.1)
SODIUM: 139 mmol/L (ref 135–145)
Total Bilirubin: 0.5 mg/dL (ref 0.3–1.2)
Total Protein: 8.3 g/dL — ABNORMAL HIGH (ref 6.5–8.1)

## 2015-05-16 LAB — LIPASE, BLOOD: Lipase: 20 U/L — ABNORMAL LOW (ref 22–51)

## 2015-05-16 MED ORDER — MORPHINE SULFATE 4 MG/ML IJ SOLN
4.0000 mg | INTRAMUSCULAR | Status: DC | PRN
  Administered 2015-05-16 (×2): 4 mg via INTRAVENOUS
  Filled 2015-05-16 (×2): qty 1

## 2015-05-16 MED ORDER — METRONIDAZOLE 500 MG PO TABS: 500.0000 mg | ORAL_TABLET | Freq: Two times a day (BID) | ORAL | Status: DC

## 2015-05-16 MED ORDER — HYDROCODONE-ACETAMINOPHEN 5-325 MG PO TABS: 2.0000 | ORAL_TABLET | ORAL | Status: DC | PRN

## 2015-05-16 MED ORDER — CIPROFLOXACIN HCL 500 MG PO TABS: 500.0000 mg | ORAL_TABLET | Freq: Two times a day (BID) | ORAL | Status: DC

## 2015-05-16 MED ORDER — METRONIDAZOLE 500 MG PO TABS
500.0000 mg | ORAL_TABLET | Freq: Once | ORAL | Status: AC
  Administered 2015-05-16: 500 mg via ORAL
  Filled 2015-05-16: qty 1

## 2015-05-16 MED ORDER — ONDANSETRON 4 MG PO TBDP: 4.0000 mg | ORAL_TABLET | Freq: Three times a day (TID) | ORAL | Status: DC | PRN

## 2015-05-16 MED ORDER — ONDANSETRON HCL 4 MG/2ML IJ SOLN
4.0000 mg | Freq: Once | INTRAMUSCULAR | Status: AC
  Administered 2015-05-16: 4 mg via INTRAVENOUS
  Filled 2015-05-16: qty 2

## 2015-05-16 MED ORDER — CIPROFLOXACIN HCL 250 MG PO TABS
500.0000 mg | ORAL_TABLET | Freq: Once | ORAL | Status: AC
  Administered 2015-05-16: 500 mg via ORAL
  Filled 2015-05-16: qty 2

## 2015-05-16 NOTE — ED Notes (Signed)
Pt states that he started with abdo pain yesterday evening, has hx of pancreatitis. However states that he also noted blood in his stool today - has some constipation but denies having to strain . Having a little nausea

## 2015-05-16 NOTE — ED Provider Notes (Signed)
CSN: [id number]     Arrival date & time 05/16/15  1805 History   First MD Initiated Contact with Patient 05/16/15 1911     Chief Complaint  Patient presents with  . Abdominal Pain  . Rectal Bleeding      HPI  Patient presents for evaluation of abdominal pain. Patient has as history of pancreatitis. Also has a history of colectomy and mucous fistula. Noticed some bloody stools today some diffuse abdominal pain. No nausea vomiting. No fevers no chills. No alcohol use.   Past Medical History  Diagnosis Date  . Hypertension   . Ileus 01/03/2012  . Retroperitoneal bleed 01/04/2012  . Pancreatitis   . Renal disorder     kidney stones   Past Surgical History  Procedure Laterality Date  . Hernia repair    . Pancreas surgery      half of pancreas removed  . Lithotripsy     History reviewed. No pertinent family history. History  Substance Use Topics  . Smoking status: Never Smoker   . Smokeless tobacco: Not on file  . Alcohol Use: No     Comment: hx of One drink per day per pt    Review of Systems  Constitutional: Negative for fever, chills, diaphoresis, appetite change and fatigue.  HENT: Negative for mouth sores, sore throat and trouble swallowing.   Eyes: Negative for visual disturbance.  Respiratory: Negative for cough, chest tightness, shortness of breath and wheezing.   Cardiovascular: Negative for chest pain.  Gastrointestinal: Positive for abdominal pain and diarrhea. Negative for nausea, vomiting and abdominal distention.       Bloody stools  Endocrine: Negative for polydipsia, polyphagia and polyuria.  Genitourinary: Negative for dysuria, frequency and hematuria.  Musculoskeletal: Negative for gait problem.  Skin: Negative for color change, pallor and rash.  Neurological: Negative for dizziness, syncope, light-headedness and headaches.  Hematological: Does not bruise/bleed easily.  Psychiatric/Behavioral: Negative for behavioral problems and confusion.       Allergies  Ace inhibitors; Coconut flavor; Contrast media; and Iodine  Home Medications   Prior to Admission medications   Medication Sig Start Date End Date Taking? Authorizing Provider  amitriptyline (ELAVIL) 150 MG tablet Take 150 mg by mouth at bedtime.  11/30/14  Yes Historical Provider, MD  CREON 36000 UNITS CPEP capsule Take 36,000-108,000 Units by mouth 3 (three) times daily with meals. 1 with snacks and 3 with meals 12/11/14  Yes Historical Provider, MD  folic acid (FOLVITE) 1 MG tablet Take 1 mg by mouth daily. 04/13/15  Yes Historical Provider, MD  hydrochlorothiazide (HYDRODIURIL) 25 MG tablet Take 25 mg by mouth every morning. Take 1 tablet (25 mg total) by mouth daily. 11/12/12 05/16/15 Yes Historical Provider, MD  magnesium oxide (MAG-OX) 400 (241.3 MG) MG tablet Take 1 tablet by mouth 2 (two) times daily. 01/13/15  Yes Historical Provider, MD  metoprolol (LOPRESSOR) 50 MG tablet Take 50 mg by mouth 2 (two) times daily. Take 1 tablet (50 mg total) by mouth 2 times daily. 08/08/12  Yes Historical Provider, MD  Oxycodone HCl 10 MG TABS Take 10-20 mg by mouth every 4 (four) hours as needed. For pain 01/21/15  Yes Historical Provider, MD  topiramate (TOPAMAX) 25 MG tablet Take 50 mg by mouth 2 (two) times daily. 02/10/15  Yes Historical Provider, MD  vitamin B-12 (CYANOCOBALAMIN) 1000 MCG tablet Take by mouth 2 (two) times a week.   Yes Historical Provider, MD  ciprofloxacin (CIPRO) 500 MG tablet Take 1 tablet (500  mg total) by mouth every 12 (twelve) hours. 05/16/15   Rolland Porter, MD  HYDROcodone-acetaminophen (NORCO/VICODIN) 5-325 MG per tablet Take 2 tablets by mouth every 4 (four) hours as needed. 05/16/15   Rolland Porter, MD  iron polysaccharides (NIFEREX) 150 MG capsule Take 150 mg by mouth 2 (two) times daily. 02/23/12 04/07/15  Historical Provider, MD  metroNIDAZOLE (FLAGYL) 500 MG tablet Take 1 tablet (500 mg total) by mouth 2 (two) times daily. 05/16/15   Rolland Porter, MD  ondansetron (ZOFRAN  ODT) 4 MG disintegrating tablet Take 1 tablet (4 mg total) by mouth every 8 (eight) hours as needed for nausea. 05/16/15   Rolland Porter, MD  oxyCODONE-acetaminophen (PERCOCET/ROXICET) 5-325 MG per tablet Take 1 tablet by mouth every 6 (six) hours as needed for severe pain. Patient not taking: Reported on 05/16/2015 08/04/14   Shon Baton, MD   BP 115/86 mmHg  Pulse 82  Temp(Src) 98.9 F (37.2 C) (Oral)  Resp 20  Ht  (1.778 m)  Wt 238 lb (107.956 kg)  BMI 34.15 kg/m2  SpO2 98% Physical Exam  Constitutional: He is oriented to person, place, and time. He appears well-developed and well-nourished. No distress.  HENT:  Head: Normocephalic.  Eyes: Conjunctivae are normal. Pupils are equal, round, and reactive to light. No scleral icterus.  Neck: Normal range of motion. Neck supple. No thyromegaly present.  Cardiovascular: Normal rate and regular rhythm.  Exam reveals no gallop and no friction rub.   No murmur heard. Pulmonary/Chest: Effort normal and breath sounds normal. No respiratory distress. He has no wheezes. He has no rales.  Abdominal: Soft. Bowel sounds are normal. He exhibits no distension. There is no tenderness. There is no rebound.  Musculoskeletal: Normal range of motion.  Neurological: He is alert and oriented to person, place, and time.  Skin: Skin is warm and dry. No rash noted.  Psychiatric: He has a normal mood and affect. His behavior is normal.    ED Course  Procedures (including critical care time) Labs Review Labs Reviewed  CBC WITH DIFFERENTIAL/PLATELET - Abnormal; Notable for the following:    WBC 3.6 (*)    Hemoglobin 12.6 (*)    HCT 38.8 (*)    Platelets 135 (*)    All other components within normal limits  COMPREHENSIVE METABOLIC PANEL - Abnormal; Notable for the following:    BUN 21 (*)    Creatinine, Ser 1.80 (*)    Total Protein 8.3 (*)    GFR calc non Af Amer 43 (*)    GFR calc Af Amer 50 (*)    All other components within normal limits   LIPASE, BLOOD - Abnormal; Notable for the following:    Lipase 20 (*)    All other components within normal limits  URINALYSIS, ROUTINE W REFLEX MICROSCOPIC (NOT AT [facility]) - Abnormal; Notable for the following:    Specific Gravity, Urine >1.030 (*)    All other components within normal limits  URINE CULTURE    Imaging Review Ct Abdomen Pelvis Wo Contrast  05/16/2015   CLINICAL DATA:  44 year old male with abdominal pain and blood in stool. Past history includes pancreatitis.  EXAM: CT ABDOMEN AND PELVIS WITHOUT CONTRAST  TECHNIQUE: Multidetector CT imaging of the abdomen and pelvis was performed following the standard protocol without IV contrast.  COMPARISON:  Prior CT scan of the abdomen and pelvis 03/02/2015  FINDINGS: Lower Chest: The lung bases are clear. Visualized cardiac structures are within normal limits for size. No  pericardial effusion. Unremarkable visualized distal thoracic esophagus.  Abdomen: Unenhanced CT was performed per clinician order. Lack of IV contrast limits sensitivity and specificity, especially for evaluation of abdominal/pelvic solid viscera. Within these limitations, unremarkable CT appearance of the stomach, duodenum, and adrenal glands. Similar appearance of the pancreas which is diffusely atrophic. Dense inflammatory change in the left upper quadrant extending from the region the pancreatic tail and inferior pole of the spleen inferiorly along the anterior para renal fascia and lateral conal fascia consistent with scarring from past pancreatitis. Overall, the degree of thickening along the anterior para renal fascia has decreased compared to 03/02/2015. A single locule of gas remains present within the collection, similar compared to prior.  Normal hepatic contour and morphology. Gallbladder is unremarkable. No intra or extrahepatic biliary ductal dilatation.  Unremarkable appearance of the bilateral kidneys. No focal solid lesion, hydronephrosis or nephrolithiasis. Right  lower quadrant ileostomy appears similar compared to prior. Status post prior changes of bowel surgery with patient ileocolonic anastomosis and right lower quadrant ostomy. No free fluid or suspicious adenopathy within the abdomen.  Pelvis: The bladder is completely decompressed. Unremarkable prostate gland. No free fluid or suspicious adenopathy.  Bones/Soft Tissues: No acute fracture or aggressive appearing lytic or blastic osseous lesion.  Vascular: Limited evaluation in the absence of IV contrast. IVC filter in place.  IMPRESSION: 1. No acute abnormality in the abdomen or pelvis to explain the patient's clinical symptoms. 2. Sequelae of prior pancreatitis with slight interval improvement compared to 03/02/2015. 3. IVC filter in place. 4. Surgical changes of prior bowel resection with right lower quadrant ostomy. No evidence of obstruction.   Electronically Signed   By: Malachy Moan M.D.   On: 05/16/2015 20:23     EKG Interpretation None      MDM   Final diagnoses:  Colitis    CT without acute findings. Patient given Cipro Flagyl. Discharge home. Low residue diet. Push fluids. Recheck any worsening symptoms.    Rolland Porter, MD 05/16/15 [phone]

## 2015-05-16 NOTE — Discharge Instructions (Signed)

## 2015-05-18 LAB — URINE CULTURE: Culture: NO GROWTH

## 2015-07-21 ENCOUNTER — Emergency Department (HOSPITAL_COMMUNITY)
Admission: EM | Admit: 2015-07-21 | Discharge: 2015-07-21 | Disposition: A | Discharge status: Home / Self Care | Payer: Medicare Other | Attending: Emergency Medicine | Admitting: Emergency Medicine

## 2015-07-21 ENCOUNTER — Encounter (HOSPITAL_COMMUNITY): Payer: Self-pay | Admitting: *Deleted

## 2015-07-21 ENCOUNTER — Emergency Department (HOSPITAL_COMMUNITY): Payer: Medicare Other

## 2015-07-21 DIAGNOSIS — Z933 Colostomy status: Secondary | ICD-10-CM | POA: Diagnosis not present

## 2015-07-21 DIAGNOSIS — R1013 Epigastric pain: Secondary | ICD-10-CM | POA: Insufficient documentation

## 2015-07-21 DIAGNOSIS — R197 Diarrhea, unspecified: Secondary | ICD-10-CM | POA: Insufficient documentation

## 2015-07-21 DIAGNOSIS — R1012 Left upper quadrant pain: Secondary | ICD-10-CM | POA: Diagnosis not present

## 2015-07-21 DIAGNOSIS — R630 Anorexia: Secondary | ICD-10-CM | POA: Insufficient documentation

## 2015-07-21 DIAGNOSIS — Z87442 Personal history of urinary calculi: Secondary | ICD-10-CM | POA: Insufficient documentation

## 2015-07-21 DIAGNOSIS — R109 Unspecified abdominal pain: Secondary | ICD-10-CM | POA: Diagnosis present

## 2015-07-21 DIAGNOSIS — Z79899 Other long term (current) drug therapy: Secondary | ICD-10-CM | POA: Insufficient documentation

## 2015-07-21 DIAGNOSIS — Z8719 Personal history of other diseases of the digestive system: Secondary | ICD-10-CM | POA: Insufficient documentation

## 2015-07-21 DIAGNOSIS — I10 Essential (primary) hypertension: Secondary | ICD-10-CM | POA: Diagnosis not present

## 2015-07-21 DIAGNOSIS — R110 Nausea: Secondary | ICD-10-CM | POA: Insufficient documentation

## 2015-07-21 DIAGNOSIS — Z9889 Other specified postprocedural states: Secondary | ICD-10-CM | POA: Insufficient documentation

## 2015-07-21 LAB — CBC WITH DIFFERENTIAL/PLATELET
Basophils Absolute: 0 10*3/uL (ref 0.0–0.1)
Basophils Relative: 0 %
Eosinophils Absolute: 0.1 10*3/uL (ref 0.0–0.7)
Eosinophils Relative: 3 %
HCT: 40.3 % (ref 39.0–52.0)
Hemoglobin: 13 g/dL (ref 13.0–17.0)
LYMPHS ABS: 1.7 10*3/uL (ref 0.7–4.0)
Lymphocytes Relative: 42 %
MCH: 26.8 pg (ref 26.0–34.0)
MCHC: 32.3 g/dL (ref 30.0–36.0)
MCV: 83.1 fL (ref 78.0–100.0)
MONO ABS: 0.3 10*3/uL (ref 0.1–1.0)
MONOS PCT: 7 %
NEUTROS ABS: 2 10*3/uL (ref 1.7–7.7)
Neutrophils Relative %: 48 %
Platelets: 134 10*3/uL — ABNORMAL LOW (ref 150–400)
RBC: 4.85 MIL/uL (ref 4.22–5.81)
RDW: 16.1 % — AB (ref 11.5–15.5)
WBC: 4 10*3/uL (ref 4.0–10.5)

## 2015-07-21 LAB — LIPASE, BLOOD: Lipase: 29 U/L (ref 22–51)

## 2015-07-21 LAB — COMPREHENSIVE METABOLIC PANEL
ALT: 26 U/L (ref 17–63)
ANION GAP: 4 — AB (ref 5–15)
AST: 24 U/L (ref 15–41)
Albumin: 4.4 g/dL (ref 3.5–5.0)
Alkaline Phosphatase: 89 U/L (ref 38–126)
BILIRUBIN TOTAL: 0.6 mg/dL (ref 0.3–1.2)
BUN: 22 mg/dL — ABNORMAL HIGH (ref 6–20)
CO2: 25 mmol/L (ref 22–32)
Calcium: 9.4 mg/dL (ref 8.9–10.3)
Chloride: 109 mmol/L (ref 101–111)
Creatinine, Ser: 1.93 mg/dL — ABNORMAL HIGH (ref 0.61–1.24)
GFR calc Af Amer: 46 mL/min — ABNORMAL LOW (ref >60)
GFR, EST NON AFRICAN AMERICAN: 40 mL/min — AB (ref >60)
Glucose, Bld: 114 mg/dL — ABNORMAL HIGH (ref 65–99)
POTASSIUM: 5 mmol/L (ref 3.5–5.1)
Sodium: 138 mmol/L (ref 135–145)
TOTAL PROTEIN: 8 g/dL (ref 6.5–8.1)

## 2015-07-21 LAB — URINALYSIS, ROUTINE W REFLEX MICROSCOPIC
BILIRUBIN URINE: NEGATIVE
Glucose, UA: NEGATIVE mg/dL
HGB URINE DIPSTICK: NEGATIVE
KETONES UR: NEGATIVE mg/dL
Leukocytes, UA: NEGATIVE
NITRITE: NEGATIVE
PROTEIN: NEGATIVE mg/dL
UROBILINOGEN UA: 0.2 mg/dL (ref 0.0–1.0)
pH: 5.5 (ref 5.0–8.0)

## 2015-07-21 MED ORDER — ONDANSETRON HCL 4 MG/2ML IJ SOLN
4.0000 mg | Freq: Once | INTRAMUSCULAR | Status: AC
  Administered 2015-07-21: 4 mg via INTRAVENOUS
  Filled 2015-07-21: qty 2

## 2015-07-21 MED ORDER — SODIUM CHLORIDE 0.9 % IV BOLUS (SEPSIS)
1000.0000 mL | Freq: Once | INTRAVENOUS | Status: AC
  Administered 2015-07-21: 1000 mL via INTRAVENOUS

## 2015-07-21 MED ORDER — MORPHINE SULFATE (PF) 4 MG/ML IV SOLN
4.0000 mg | Freq: Once | INTRAVENOUS | Status: AC
  Administered 2015-07-21: 4 mg via INTRAVENOUS
  Filled 2015-07-21: qty 1

## 2015-07-21 NOTE — ED Provider Notes (Signed)
CSN: [id number]     Arrival date & time 07/21/15  1313 History   First MD Initiated Contact with Patient 07/21/15 1609     Chief Complaint  Patient presents with  . Abdominal Pain     (Consider location/radiation/quality/duration/timing/severity/associated sxs/prior Treatment) HPI Comments: Patient complains of abdominal pain from chronic pancreatitis. Associated with nausea but no vomiting. Takes oxycodone without relief. Denies any fever. Endorses nausea but no vomiting. Has had frequent episodes of diarrhea. Denies any dysuria hematuria. 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  Note from 03/02/2015 Visit: He has a three-year history of abdominal pain. Had episode of hemorrhagic pancreatitis. 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    The history is provided by the patient.    Past Medical History  Diagnosis Date  . Hypertension   . Ileus (H[facility]) 01/03/2012  . Retroperitoneal bleed 01/04/2012  . Pancreatitis   . Renal disorder     kidney stones   Past Surgical History  Procedure Laterality Date  . Hernia repair    . Pancreas surgery      half of pancreas removed  . Lithotripsy     No family history on file. Social History  Substance Use Topics  . Smoking status: Never Smoker   . Smokeless tobacco: None  . Alcohol Use: No     Comment: denies today 07/21/2015    Review of Systems  Constitutional: Positive for activity change and appetite change. Negative for fever.  HENT: Negative for congestion and postnasal drip.   Eyes: Negative for visual disturbance.  Respiratory: Negative for cough, chest tightness and shortness of breath.   Cardiovascular: Negative for chest pain.  Gastrointestinal: Positive for  nausea, abdominal pain and diarrhea. Negative for vomiting.  Genitourinary: Negative for dysuria, hematuria and testicular pain.  Musculoskeletal: Negative for myalgias and arthralgias.  Skin: Negative for wound.  Neurological: Negative for dizziness, weakness and headaches.  A complete 10 system review of systems was obtained and all systems are negative except as noted in the HPI and PMH.      Allergies  Ace inhibitors; Coconut flavor; Contrast media; and Iodine  Home Medications   Prior to Admission medications   Medication Sig Start Date End Date Taking? Authorizing Provider  amitriptyline (ELAVIL) 150 MG tablet Take 150 mg by mouth at bedtime.  11/30/14  Yes Historical Provider, MD  CREON 36000 UNITS CPEP capsule Take 36,000-108,000 Units by mouth 3 (three) times daily with meals. 1 with snacks and 3 with meals 12/11/14  Yes Historical Provider, MD  folic acid (FOLVITE) 1 MG tablet Take 1 mg by mouth daily. 04/13/15  Yes Historical Provider, MD  hydrochlorothiazide (HYDRODIURIL) 25 MG tablet Take 25 mg by mouth daily.   Yes Historical Provider, MD  iron polysaccharides (NIFEREX) 150 MG capsule Take 150 mg by mouth 2 (two) times daily.   Yes Historical Provider, MD  magnesium oxide (MAG-OX) 400 (241.3 MG) MG tablet Take 1 tablet by mouth 2 (two) times daily. 01/13/15  Yes Historical Provider, MD  metoprolol (LOPRESSOR) 50 MG tablet Take 50 mg by mouth 2 (two) times daily. Take 1 tablet (50 mg total) by mouth 2 times daily. 08/08/12  Yes Historical Provider, MD  Oxycodone HCl 10 MG TABS Take 10-20 mg by mouth every  4 (four) hours as needed. For pain 01/21/15  Yes Historical Provider, MD  topiramate (TOPAMAX) 50 MG tablet Take 50-100 mg by mouth 2 (two) times daily. 1 tablet in the Am and 2 tablets at night   Yes Historical Provider, MD  vitamin B-12 (CYANOCOBALAMIN) 1000 MCG tablet Take by mouth 2 (two) times a week.   Yes Historical Provider, MD  ciprofloxacin (CIPRO) 500 MG tablet Take 1  tablet (500 mg total) by mouth every 12 (twelve) hours. Patient not taking: Reported on 07/21/2015 05/16/15   Rolland Porter, MD  HYDROcodone-acetaminophen (NORCO/VICODIN) 5-325 MG per tablet Take 2 tablets by mouth every 4 (four) hours as needed. Patient taking differently: Take 2 tablets by mouth every 4 (four) hours as needed for moderate pain.  05/16/15   Rolland Porter, MD  metroNIDAZOLE (FLAGYL) 500 MG tablet Take 1 tablet (500 mg total) by mouth 2 (two) times daily. Patient not taking: Reported on 07/21/2015 05/16/15   Rolland Porter, MD  ondansetron (ZOFRAN ODT) 4 MG disintegrating tablet Take 1 tablet (4 mg total) by mouth every 8 (eight) hours as needed for nausea. 05/16/15   Rolland Porter, MD  oxyCODONE-acetaminophen (PERCOCET/ROXICET) 5-325 MG per tablet Take 1 tablet by mouth every 6 (six) hours as needed for severe pain. Patient not taking: Reported on 05/16/2015 08/04/14   Shon Baton, MD   BP 118/88 mmHg  Pulse 79  Temp(Src) 97.7 F (36.5 C) (Oral)  Resp 18  Ht  (1.778 m)  Wt 225 lb (102.059 kg)  BMI 32.28 kg/m2  SpO2 100% Physical Exam  Constitutional: He is oriented to person, place, and time. He appears well-developed and well-nourished. No distress.  HENT:  Head: Normocephalic and atraumatic.  Mouth/Throat: Oropharynx is clear and moist. No oropharyngeal exudate.  Eyes: Conjunctivae and EOM are normal. Pupils are equal, round, and reactive to light.  Neck: Normal range of motion. Neck supple.  No meningismus.  Cardiovascular: Normal rate, regular rhythm, normal heart sounds and intact distal pulses.   No murmur heard. Pulmonary/Chest: Effort normal and breath sounds normal. No respiratory distress.  Abdominal: Soft. There is tenderness. There is no rebound and no guarding.  R sided mucus fistula.  Epigastric and LUQ tenderness. No guarding or rebound.  Musculoskeletal: Normal range of motion. He exhibits no edema or tenderness.  Neurological: He is alert and oriented to  person, place, and time. No cranial nerve deficit. He exhibits normal muscle tone. Coordination normal.  No ataxia on finger to nose bilaterally. No pronator drift. 5/5 strength throughout. CN 2-12 intact. Negative Romberg. Equal grip strength. Sensation intact. Gait is normal.   Skin: Skin is warm.  Psychiatric: He has a normal mood and affect. His behavior is normal.  Nursing note and vitals reviewed.   ED Course  Procedures (including critical care time) Labs Review Labs Reviewed  CBC WITH DIFFERENTIAL/PLATELET - Abnormal; Notable for the following:    RDW 16.1 (*)    Platelets 134 (*)    All other components within normal limits  COMPREHENSIVE METABOLIC PANEL - Abnormal; Notable for the following:    Glucose, Bld 114 (*)    BUN 22 (*)    Creatinine, Ser 1.93 (*)    GFR calc non Af Amer 40 (*)    GFR calc Af Amer 46 (*)    Anion gap 4 (*)    All other components within normal limits  URINALYSIS, ROUTINE W REFLEX MICROSCOPIC (NOT AT [facility]) - Abnormal; Notable for the following:  Specific Gravity, Urine >1.030 (*)    All other components within normal limits  LIPASE, BLOOD    Imaging Review Ct Abdomen Pelvis Wo Contrast  07/21/2015  CLINICAL DATA:  44 year old male with worsening chronic abdominal pain today. History of pancreatitis and hernia repair. EXAM: CT ABDOMEN AND PELVIS WITHOUT CONTRAST TECHNIQUE: Multidetector CT imaging of the abdomen and pelvis was performed following the standard protocol without IV contrast. COMPARISON:  05/16/2015 and prior CTs FINDINGS: Please note that parenchymal abnormalities may be missed without intravenous contrast. Lower chest:  Mild left basilar scarring again noted. Hepatobiliary: The liver and gallbladder are unremarkable. There is no evidence of biliary dilatation. Pancreas: Unchanged inflammatory changes/soft tissue in the region of the pancreas extending into the anterior pararenal and lateral conal regions again noted,  left-greater-than-right. Tiny foci of gas in the left anterior pararenal region again noted. No definite acute peripancreatic inflammation is identified. Spleen: Spleen is unchanged. Adrenals/Urinary Tract: The kidneys, adrenal glands and bladder are unremarkable. Stomach/Bowel: Surgical changes involving the stomach and a right lower quadrant ostomy again noted. There is no evidence of bowel obstruction or definite bowel wall thickening. Vascular/Lymphatic: An IVC filter is again noted. There is no evidence of abdominal aortic aneurysm. No enlarged lymph nodes are identified. Reproductive: Prostate unremarkable. Other: No free fluid, new collection or pneumoperitoneum. Musculoskeletal: No acute or suspicious abnormalities. IMPRESSION: No evidence of acute abnormality. Chronic changes of the pancreas and retroperitoneum as described. No evidence of acute inflammation. Electronically Signed   By: Harmon PierJeffrey  Hu M.D.   On: 07/21/2015 19:51   I have personally reviewed and evaluated these images and lab results as part of my medical decision-making.   EKG Interpretation None      MDM   Final diagnoses:  Abdominal pain, unspecified abdominal location  Acute on chronic abdominal pain with history of pancreatitis.  Associated with nausea and diarrhea.    Abdomen soft without peritoneal signs. Lipase and LFTs normal.  Cr slightly worse than normal.   CT scan shows no acute abnormality. There are chronic surgical changes. No acute inflammation.  Patient states his pain is controlled. No vomiting in the ED. Tolerating by mouth. He is requesting to be discharged. Has his pain medication at home. [place] in [place]. Return precautions discussed.  BP 118/88 mmHg  Pulse 79  Temp(Src) 97.7 F (36.5 C) (Oral)  Resp 18  Ht 5\' 10"  (1.778 m)  Wt 225 lb (102.059 kg)  BMI 32.28 kg/m2  SpO2 100%   Glynn OctaveStephen Zyon Grout, MD 07/21/15 2129

## 2015-07-21 NOTE — Discharge Instructions (Signed)

## 2015-07-21 NOTE — ED Notes (Signed)
Patient was able to drink without any difficulties.

## 2015-07-21 NOTE — ED Notes (Signed)
Wait time explained to pt. No room availble at this time.

## 2015-07-21 NOTE — ED Notes (Signed)
Abdominal pain became worse this morning (states chronic pain). States that the pain feels like his usual pancreatitis flare up. Pt states diarrhea and nausea. Diarrhea x a few days.

## 2015-07-27 ENCOUNTER — Emergency Department (HOSPITAL_COMMUNITY)
Admission: EM | Admit: 2015-07-27 | Discharge: 2015-07-28 | Disposition: A | Discharge status: Home / Self Care | Payer: Medicare Other | Attending: Emergency Medicine | Admitting: Emergency Medicine

## 2015-07-27 ENCOUNTER — Encounter (HOSPITAL_COMMUNITY): Payer: Self-pay | Admitting: *Deleted

## 2015-07-27 DIAGNOSIS — I10 Essential (primary) hypertension: Secondary | ICD-10-CM | POA: Diagnosis not present

## 2015-07-27 DIAGNOSIS — Z8719 Personal history of other diseases of the digestive system: Secondary | ICD-10-CM | POA: Insufficient documentation

## 2015-07-27 DIAGNOSIS — R1033 Periumbilical pain: Secondary | ICD-10-CM | POA: Insufficient documentation

## 2015-07-27 DIAGNOSIS — R109 Unspecified abdominal pain: Secondary | ICD-10-CM

## 2015-07-27 DIAGNOSIS — G8929 Other chronic pain: Secondary | ICD-10-CM | POA: Diagnosis not present

## 2015-07-27 DIAGNOSIS — R112 Nausea with vomiting, unspecified: Secondary | ICD-10-CM

## 2015-07-27 DIAGNOSIS — Z938 Other artificial opening status: Secondary | ICD-10-CM | POA: Diagnosis not present

## 2015-07-27 DIAGNOSIS — R197 Diarrhea, unspecified: Secondary | ICD-10-CM | POA: Diagnosis not present

## 2015-07-27 DIAGNOSIS — N289 Disorder of kidney and ureter, unspecified: Secondary | ICD-10-CM | POA: Insufficient documentation

## 2015-07-27 DIAGNOSIS — Z79899 Other long term (current) drug therapy: Secondary | ICD-10-CM | POA: Diagnosis not present

## 2015-07-27 DIAGNOSIS — Z9889 Other specified postprocedural states: Secondary | ICD-10-CM | POA: Insufficient documentation

## 2015-07-27 LAB — CBC
HCT: 38.8 % — ABNORMAL LOW (ref 39.0–52.0)
Hemoglobin: 12.6 g/dL — ABNORMAL LOW (ref 13.0–17.0)
MCH: 26.8 pg (ref 26.0–34.0)
MCHC: 32.5 g/dL (ref 30.0–36.0)
MCV: 82.6 fL (ref 78.0–100.0)
Platelets: 126 10*3/uL — ABNORMAL LOW (ref 150–400)
RBC: 4.7 MIL/uL (ref 4.22–5.81)
RDW: 16 % — AB (ref 11.5–15.5)
WBC: 4.7 10*3/uL (ref 4.0–10.5)

## 2015-07-27 LAB — COMPREHENSIVE METABOLIC PANEL
ALBUMIN: 4.1 g/dL (ref 3.5–5.0)
ALT: 23 U/L (ref 17–63)
AST: 25 U/L (ref 15–41)
Alkaline Phosphatase: 102 U/L (ref 38–126)
Anion gap: 7 (ref 5–15)
BILIRUBIN TOTAL: 0.7 mg/dL (ref 0.3–1.2)
BUN: 17 mg/dL (ref 6–20)
CO2: 25 mmol/L (ref 22–32)
CREATININE: 1.69 mg/dL — AB (ref 0.61–1.24)
Calcium: 9.7 mg/dL (ref 8.9–10.3)
Chloride: 108 mmol/L (ref 101–111)
GFR calc Af Amer: 54 mL/min — ABNORMAL LOW (ref >60)
GFR, EST NON AFRICAN AMERICAN: 47 mL/min — AB (ref >60)
Glucose, Bld: 146 mg/dL — ABNORMAL HIGH (ref 65–99)
Potassium: 3.9 mmol/L (ref 3.5–5.1)
Sodium: 140 mmol/L (ref 135–145)
TOTAL PROTEIN: 7.9 g/dL (ref 6.5–8.1)

## 2015-07-27 LAB — URINALYSIS, ROUTINE W REFLEX MICROSCOPIC
Bilirubin Urine: NEGATIVE
Glucose, UA: NEGATIVE mg/dL
HGB URINE DIPSTICK: NEGATIVE
Ketones, ur: NEGATIVE mg/dL
LEUKOCYTES UA: NEGATIVE
NITRITE: NEGATIVE
Protein, ur: NEGATIVE mg/dL
Urobilinogen, UA: 0.2 mg/dL (ref 0.0–1.0)
pH: 6 (ref 5.0–8.0)

## 2015-07-27 LAB — LIPASE, BLOOD: LIPASE: 23 U/L (ref 22–51)

## 2015-07-27 MED ORDER — ONDANSETRON HCL 4 MG/2ML IJ SOLN
4.0000 mg | Freq: Once | INTRAMUSCULAR | Status: AC | PRN
  Administered 2015-07-27: 4 mg via INTRAVENOUS
  Filled 2015-07-27: qty 2

## 2015-07-27 NOTE — ED Notes (Signed)
Pt c/o abdominal pain with n/v/d since last. Pt also c/o dizziness when he stands up.

## 2015-07-28 DIAGNOSIS — R1033 Periumbilical pain: Secondary | ICD-10-CM | POA: Diagnosis not present

## 2015-07-28 MED ORDER — DIPHENHYDRAMINE HCL 50 MG/ML IJ SOLN
25.0000 mg | Freq: Once | INTRAMUSCULAR | Status: AC
  Administered 2015-07-28: 25 mg via INTRAVENOUS
  Filled 2015-07-28: qty 1

## 2015-07-28 MED ORDER — SODIUM CHLORIDE 0.9 % IV SOLN
1000.0000 mL | Freq: Once | INTRAVENOUS | Status: AC
  Administered 2015-07-27: 1000 mL via INTRAVENOUS

## 2015-07-28 MED ORDER — MORPHINE SULFATE (PF) 4 MG/ML IV SOLN
4.0000 mg | Freq: Once | INTRAVENOUS | Status: AC
  Administered 2015-07-28: 4 mg via INTRAVENOUS
  Filled 2015-07-28: qty 1

## 2015-07-28 MED ORDER — SODIUM CHLORIDE 0.9 % IV SOLN
1000.0000 mL | INTRAVENOUS | Status: DC
  Administered 2015-07-28: 1000 mL via INTRAVENOUS

## 2015-07-28 MED ORDER — METOCLOPRAMIDE HCL 5 MG/ML IJ SOLN
10.0000 mg | Freq: Once | INTRAMUSCULAR | Status: AC
  Administered 2015-07-28: 10 mg via INTRAVENOUS
  Filled 2015-07-28: qty 2

## 2015-07-28 MED ORDER — METOCLOPRAMIDE HCL 10 MG PO TABS: 10.0000 mg | ORAL_TABLET | Freq: Four times a day (QID) | ORAL | Status: DC | PRN

## 2015-07-28 NOTE — Discharge Instructions (Signed)
Nausea and Vomiting Nausea is a sick feeling that often comes before throwing up (vomiting). Vomiting is a reflex where stomach contents come out of your mouth. Vomiting can cause severe loss of body fluids (dehydration). Children and elderly adults can become dehydrated quickly, especially if they also have diarrhea. Nausea and vomiting are symptoms of a condition or disease. It is important to find the cause of your symptoms. CAUSES   Direct irritation of the stomach lining. This irritation can result from increased acid production (gastroesophageal reflux disease), infection, food poisoning, taking certain medicines (such as nonsteroidal anti-inflammatory drugs), alcohol use, or tobacco use.  Signals from the brain.These signals could be caused by a headache, heat exposure, an inner ear disturbance, increased pressure in the brain from injury, infection, a tumor, or a concussion, pain, emotional stimulus, or metabolic problems.  An obstruction in the gastrointestinal tract (bowel obstruction).  Illnesses such as diabetes, hepatitis, gallbladder problems, appendicitis, kidney problems, cancer, sepsis, atypical symptoms of a heart attack, or eating disorders.  Medical treatments such as chemotherapy and radiation.  Receiving medicine that makes you sleep (general anesthetic) during surgery. DIAGNOSIS Your caregiver may ask for tests to be done if the problems do not improve after a few days. Tests may also be done if symptoms are severe or if the reason for the nausea and vomiting is not clear. Tests may include:  Urine tests.  Blood tests.  Stool tests.  Cultures (to look for evidence of infection).  X-rays or other imaging studies. Test results can help your caregiver make decisions about treatment or the need for additional tests. TREATMENT You need to stay well hydrated. Drink frequently but in small amounts.You may wish to drink water, sports drinks, clear broth, or eat frozen  ice pops or gelatin dessert to help stay hydrated.When you eat, eating slowly may help prevent nausea.There are also some antinausea medicines that may help prevent nausea. HOME CARE INSTRUCTIONS   Take all medicine as directed by your caregiver.  If you do not have an appetite, do not force yourself to eat. However, you must continue to drink fluids.  If you have an appetite, eat a normal diet unless your caregiver tells you differently.  Eat a variety of complex carbohydrates (rice, wheat, potatoes, bread), lean meats, yogurt, fruits, and vegetables.  Avoid high-fat foods because they are more difficult to digest.  Drink enough water and fluids to keep your urine clear or pale yellow.  If you are dehydrated, ask your caregiver for specific rehydration instructions. Signs of dehydration may include:  Severe thirst.  Dry lips and mouth.  Dizziness.  Dark urine.  Decreasing urine frequency and amount.  Confusion.  Rapid breathing or pulse. SEEK IMMEDIATE MEDICAL CARE IF:   You have blood or brown flecks (like coffee grounds) in your vomit.  You have black or bloody stools.  You have a severe headache or stiff neck.  You are confused.  You have severe abdominal pain.  You have chest pain or trouble breathing.  You do not urinate at least once every 8 hours.  You develop cold or clammy skin.  You continue to vomit for longer than 24 to 48 hours.  You have a fever. MAKE SURE YOU:   Understand these instructions.  Will watch your condition.  Will get help right away if you are not doing well or get worse.   This information is not intended to replace advice given to you by your health care provider. Make sure  you discuss any questions you have with your health care provider.   Document Released: 09/24/2005 Document Revised: 12/17/2011 Document Reviewed: 02/21/2011 Elsevier Interactive Patient Education 2016 Elsevier Inc.   Abdominal Pain, Adult Many  things can cause abdominal pain. Usually, abdominal pain is not caused by a disease and will improve without treatment. It can often be observed and treated at home. Your health care provider will do a physical exam and possibly order blood tests and X-rays to help determine the seriousness of your pain. However, in many cases, more time must pass before a clear cause of the pain can be found. Before that point, your health care provider may not know if you need more testing or further treatment. HOME CARE INSTRUCTIONS Monitor your abdominal pain for any changes. The following actions may help to alleviate any discomfort you are experiencing:  Only take over-the-counter or prescription medicines as directed by your health care provider.  Do not take laxatives unless directed to do so by your health care provider.  Try a clear liquid diet (broth, tea, or water) as directed by your health care provider. Slowly move to a bland diet as tolerated. SEEK MEDICAL CARE IF:  You have unexplained abdominal pain.  You have abdominal pain associated with nausea or diarrhea.  You have pain when you urinate or have a bowel movement.  You experience abdominal pain that wakes you in the night.  You have abdominal pain that is worsened or improved by eating food.  You have abdominal pain that is worsened with eating fatty foods.  You have a fever. SEEK IMMEDIATE MEDICAL CARE IF:  Your pain does not go away within 2 hours.  You keep throwing up (vomiting).  Your pain is felt only in portions of the abdomen, such as the right side or the left lower portion of the abdomen.  You pass bloody or black tarry stools. MAKE SURE YOU:  Understand these instructions.  Will watch your condition.  Will get help right away if you are not doing well or get worse.   This information is not intended to replace advice given to you by your health care provider. Make sure you discuss any questions you have with  your health care provider.   Document Released: 07/04/2005 Document Revised: 06/15/2015 Document Reviewed: 06/03/2013 Elsevier Interactive Patient Education 2016 Elsevier Inc.  Metoclopramide tablets What is this medicine? METOCLOPRAMIDE (met oh kloe PRA mide) is used to treat the symptoms of gastroesophageal reflux disease (GERD) like heartburn. It is also used to treat people with slow emptying of the stomach and intestinal tract. This medicine may be used for other purposes; ask your health care provider or pharmacist if you have questions. What should I tell my health care provider before I take this medicine? They need to know if you have any of these conditions: -breast cancer -depression -diabetes -heart failure -high blood pressure -kidney disease -liver disease -Parkinson's disease or a movement disorder -pheochromocytoma -seizures -stomach obstruction, bleeding, or perforation -an unusual or allergic reaction to metoclopramide, procainamide, sulfites, other medicines, foods, dyes, or preservatives -pregnant or trying to get pregnant -breast-feeding How should I use this medicine? Take this medicine by mouth with a glass of water. Follow the directions on the prescription label. Take this medicine on an empty stomach, about 30 minutes before eating. Take your doses at regular intervals. Do not take your medicine more often than directed. Do not stop taking except on the advice of your doctor or health care  professional. A special MedGuide will be given to you by the pharmacist with each prescription and refill. Be sure to read this information carefully each time. Talk to your pediatrician regarding the use of this medicine in children. Special care may be needed. Overdosage: If you think you have taken too much of this medicine contact a poison control center or emergency room at once. NOTE: This medicine is only for you. Do not share this medicine with others. What if I  miss a dose? If you miss a dose, take it as soon as you can. If it is almost time for your next dose, take only that dose. Do not take double or extra doses. What may interact with this medicine? -acetaminophen -cyclosporine -digoxin -medicines for blood pressure -medicines for diabetes, including insulin -medicines for hay fever and other allergies -medicines for depression, especially an Monoamine Oxidase Inhibitor (MAOI) -medicines for Parkinson's disease, like levodopa -medicines for sleep or for pain -tetracycline This list may not describe all possible interactions. Give your health care provider a list of all the medicines, herbs, non-prescription drugs, or dietary supplements you use. Also tell them if you smoke, drink alcohol, or use illegal drugs. Some items may interact with your medicine. What should I watch for while using this medicine? It may take a few weeks for your stomach condition to start to get better. However, do not take this medicine for longer than 12 weeks. The longer you take this medicine, and the more you take it, the greater your chances are of developing serious side effects. If you are an elderly patient, a male patient, or you have diabetes, you may be at an increased risk for side effects from this medicine. Contact your doctor immediately if you start having movements you cannot control such as lip smacking, rapid movements of the tongue, involuntary or uncontrollable movements of the eyes, head, arms and legs, or muscle twitches and spasms. Patients and their families should watch out for worsening depression or thoughts of suicide. Also watch out for any sudden or severe changes in feelings such as feeling anxious, agitated, panicky, irritable, hostile, aggressive, impulsive, severely restless, overly excited and hyperactive, or not being able to sleep. If this happens, especially at the beginning of treatment or after a change in dose, call your doctor. Do  not treat yourself for high fever. Ask your doctor or health care professional for advice. You may get drowsy or dizzy. Do not drive, use machinery, or do anything that needs mental alertness until you know how this drug affects you. Do not stand or sit up quickly, especially if you are an older patient. This reduces the risk of dizzy or fainting spells. Alcohol can make you more drowsy and dizzy. Avoid alcoholic drinks. What side effects may I notice from receiving this medicine? Side effects that you should report to your doctor or health care professional as soon as possible: -allergic reactions like skin rash, itching or hives, swelling of the face, lips, or tongue -abnormal production of milk in females -breast enlargement in both males and females -change in the way you walk -difficulty moving, speaking or swallowing -drooling, lip smacking, or rapid movements of the tongue -excessive sweating -fever -involuntary or uncontrollable movements of the eyes, head, arms and legs -irregular heartbeat or palpitations -muscle twitches and spasms -unusually weak or tired Side effects that usually do not require medical attention (report to your doctor or health care professional if they continue or are bothersome): -change in sex  drive or performance -depressed mood -diarrhea -difficulty sleeping -headache -menstrual changes -restless or nervous This list may not describe all possible side effects. Call your doctor for medical advice about side effects. You may report side effects to FDA at 1-800-FDA-1088. Where should I keep my medicine? Keep out of the reach of children. Store at room temperature between 20 and 25 degrees C (68 and 77 degrees F). Protect from light. Keep container tightly closed. Throw away any unused medicine after the expiration date. NOTE: This sheet is a summary. It may not cover all possible information. If you have questions about this medicine, talk to your doctor,  pharmacist, or health care provider.    2016, Elsevier/Gold Standard. (2012-01-22 13:04:38)

## 2015-07-28 NOTE — ED Provider Notes (Signed)
CSN: [id number]     Arrival date & time 07/27/15  2137 History   First MD Initiated Contact with Patient 07/28/15 0010     Chief Complaint  Patient presents with  . Abdominal Pain     (Consider location/radiation/quality/duration/timing/severity/associated sxs/prior Treatment) Patient is a 44 y.o. male presenting with abdominal pain. The history is provided by the patient.  Abdominal Pain He has a history of chronic abdominal pain and had recurrence of abdominal pain with nausea and vomiting last night. He states that he had diarrhea about 4 days ago but has not had diarrhea today. He has not been able to hold anything on his stomach. He rates pain at 7/10. He denies fever, chills, sweats. He has noted that he gets dizzy when he stands up. He had been given a dose of ondansetron on arrival and has noted some slight improvement in nausea.  Past Medical History  Diagnosis Date  . Hypertension   . Ileus (HCC) 01/03/2012  . Retroperitoneal bleed 01/04/2012  . Pancreatitis   . Renal disorder     kidney stones   Past Surgical History  Procedure Laterality Date  . Hernia repair    . Pancreas surgery      half of pancreas removed  . Lithotripsy     History reviewed. No pertinent family history. Social History  Substance Use Topics  . Smoking status: Never Smoker   . Smokeless tobacco: None  . Alcohol Use: No     Comment: denies today 07/21/2015    Review of Systems  Gastrointestinal: Positive for abdominal pain.  All other systems reviewed and are negative.     Allergies  Ace inhibitors; Coconut flavor; Contrast media; and Iodine  Home Medications   Prior to Admission medications   Medication Sig Start Date End Date Taking? Authorizing Provider  amitriptyline (ELAVIL) 150 MG tablet Take 150 mg by mouth at bedtime.  11/30/14   Historical Provider, MD  ciprofloxacin (CIPRO) 500 MG tablet Take 1 tablet (500 mg total) by mouth every 12 (twelve) hours. Patient not taking:  Reported on 07/21/2015 05/16/15   Rolland Porter, MD  CREON 36000 UNITS CPEP capsule Take 36,000-108,000 Units by mouth 3 (three) times daily with meals. 1 with snacks and 3 with meals 12/11/14   Historical Provider, MD  folic acid (FOLVITE) 1 MG tablet Take 1 mg by mouth daily. 04/13/15   Historical Provider, MD  hydrochlorothiazide (HYDRODIURIL) 25 MG tablet Take 25 mg by mouth daily.    Historical Provider, MD  HYDROcodone-acetaminophen (NORCO/VICODIN) 5-325 MG per tablet Take 2 tablets by mouth every 4 (four) hours as needed. Patient taking differently: Take 2 tablets by mouth every 4 (four) hours as needed for moderate pain.  05/16/15   Rolland Porter, MD  iron polysaccharides (NIFEREX) 150 MG capsule Take 150 mg by mouth 2 (two) times daily.    Historical Provider, MD  magnesium oxide (MAG-OX) 400 (241.3 MG) MG tablet Take 1 tablet by mouth 2 (two) times daily. 01/13/15   Historical Provider, MD  metoprolol (LOPRESSOR) 50 MG tablet Take 50 mg by mouth 2 (two) times daily. Take 1 tablet (50 mg total) by mouth 2 times daily. 08/08/12   Historical Provider, MD  metroNIDAZOLE (FLAGYL) 500 MG tablet Take 1 tablet (500 mg total) by mouth 2 (two) times daily. Patient not taking: Reported on 07/21/2015 05/16/15   Rolland Porter, MD  ondansetron (ZOFRAN ODT) 4 MG disintegrating tablet Take 1 tablet (4 mg total) by mouth every 8 (eight)  hours as needed for nausea. 05/16/15   Rolland Porter, MD  Oxycodone HCl 10 MG TABS Take 10-20 mg by mouth every 4 (four) hours as needed. For pain 01/21/15   Historical Provider, MD  oxyCODONE-acetaminophen (PERCOCET/ROXICET) 5-325 MG per tablet Take 1 tablet by mouth every 6 (six) hours as needed for severe pain. Patient not taking: Reported on 05/16/2015 08/04/14   Shon Baton, MD  topiramate (TOPAMAX) 50 MG tablet Take 50-100 mg by mouth 2 (two) times daily. 1 tablet in the Am and 2 tablets at night    Historical Provider, MD  vitamin B-12 (CYANOCOBALAMIN) 1000 MCG tablet Take by mouth 2 (two)  times a week.    Historical Provider, MD   BP 112/86 mmHg  Pulse 77  Temp(Src) 97.5 F (36.4 C) (Oral)  Resp 14  Ht  (1.778 m)  Wt 225 lb (102.059 kg)  BMI 32.28 kg/m2  SpO2 98% Physical Exam  Nursing note and vitals reviewed.  44 year old male, resting comfortably and in no acute distress. Vital signs are normal. Oxygen saturation is 98%, which is normal. Head is normocephalic and atraumatic. PERRLA, EOMI. Oropharynx is clear. Neck is nontender and supple without adenopathy or JVD. Back is nontender and there is no CVA tenderness. Lungs are clear without rales, wheezes, or rhonchi. Chest is nontender. Heart has regular rate and rhythm without murmur. Abdomen is soft, flat, with mild to moderate tenderness in the periumbilical and right midabdomen. There is no rebound or guarding. 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 There are no masses or hepatosplenomegaly and peristalsis is hypoactive. Extremities have no cyanosis or edema, full range of motion is present. Skin is warm and dry without rash. Neurologic: Mental status is normal, cranial nerves are intact, there are no motor or sensory deficits.  ED Course  Procedures (including critical care time) Labs Review Results for orders placed or performed during the hospital encounter of 07/27/15  Urinalysis, Routine w reflex microscopic (not at [facility])  Result Value Ref Range   Color, Urine YELLOW YELLOW   APPearance CLEAR CLEAR   Specific Gravity, Urine >1.030 (H) 1.005 - 1.030   pH 6.0 5.0 - 8.0   Glucose, UA NEGATIVE NEGATIVE mg/dL   Hgb urine dipstick NEGATIVE NEGATIVE   Bilirubin Urine NEGATIVE NEGATIVE   Ketones, ur NEGATIVE NEGATIVE mg/dL   Protein, ur NEGATIVE NEGATIVE mg/dL   Urobilinogen, UA 0.2 0.0 - 1.0 mg/dL   Nitrite NEGATIVE NEGATIVE   Leukocytes, UA NEGATIVE NEGATIVE  Lipase, blood  Result Value Ref Range   Lipase 23 22 - 51 U/L  Comprehensive metabolic  panel  Result Value Ref Range   Sodium 140 135 - 145 mmol/L   Potassium 3.9 3.5 - 5.1 mmol/L   Chloride 108 101 - 111 mmol/L   CO2 25 22 - 32 mmol/L   Glucose, Bld 146 (H) 65 - 99 mg/dL   BUN 17 6 - 20 mg/dL   Creatinine, Ser 1.61 (H) 0.61 - 1.24 mg/dL   Calcium 9.7 8.9 - 09.6 mg/dL   Total Protein 7.9 6.5 - 8.1 g/dL   Albumin 4.1 3.5 - 5.0 g/dL   AST 25 15 - 41 U/L   ALT 23 17 - 63 U/L   Alkaline Phosphatase 102 38 - 126 U/L   Total Bilirubin 0.7 0.3 - 1.2 mg/dL   GFR calc non Af Amer 47 (L) >60 mL/min   GFR calc  Af Amer 54 (L) >60 mL/min   Anion gap 7 5 - 15  CBC  Result Value Ref Range   WBC 4.7 4.0 - 10.5 K/uL   RBC 4.70 4.22 - 5.81 MIL/uL   Hemoglobin 12.6 (L) 13.0 - 17.0 g/dL   HCT 16.138.8 (L) 09.639.0 - 04.552.0 %   MCV 82.6 78.0 - 100.0 fL   MCH 26.8 26.0 - 34.0 pg   MCHC 32.5 30.0 - 36.0 g/dL   RDW 40.916.0 (H) 81.111.5 - 91.415.5 %   Platelets 126 (L) 150 - 400 K/uL   I have personally reviewed and evaluated these lab results as part of my medical decision-making.   MDM   Final diagnoses:  Nausea and vomiting, vomiting of unspecified type  Chronic abdominal pain  Renal insufficiency    Nausea and vomiting and exacerbation of chronic abdominal pain. Old records are reviewed and he was in the ED 6 days ago with similar complaints treated with IV fluids, ondansetron, and morphine. Laboratory workup is unremarkable. He has chronic renal insufficiency which is actually improved from last visit. Lipase is normal. Hemoglobin is essentially unchanged. Thrombocytopenia is present which is also unchanged from baseline. He'll be given additional IV fluids will be given metoclopramide for nausea and morphine for pain.  He feels much better with above noted treatment and is discharged with a prescription for metoclopramide.  Dione Boozeavid Egon Dittus, MD 07/28/15 [phone]

## 2015-07-28 NOTE — ED Notes (Signed)
Patient verbalizes understanding of discharge instructions, prescription medications, home care and follow up care. Patient ambulatory out of department at this time with spouse at this time.

## 2015-08-18 ENCOUNTER — Emergency Department (HOSPITAL_COMMUNITY): Payer: Medicare Other

## 2015-08-18 ENCOUNTER — Encounter (HOSPITAL_COMMUNITY): Payer: Self-pay | Admitting: Emergency Medicine

## 2015-08-18 ENCOUNTER — Emergency Department (HOSPITAL_COMMUNITY)
Admission: EM | Admit: 2015-08-18 | Discharge: 2015-08-18 | Disposition: A | Discharge status: Home / Self Care | Payer: Medicare Other | Attending: Emergency Medicine | Admitting: Emergency Medicine

## 2015-08-18 DIAGNOSIS — Z9889 Other specified postprocedural states: Secondary | ICD-10-CM | POA: Diagnosis not present

## 2015-08-18 DIAGNOSIS — R197 Diarrhea, unspecified: Secondary | ICD-10-CM | POA: Diagnosis not present

## 2015-08-18 DIAGNOSIS — Z86718 Personal history of other venous thrombosis and embolism: Secondary | ICD-10-CM | POA: Diagnosis not present

## 2015-08-18 DIAGNOSIS — R630 Anorexia: Secondary | ICD-10-CM | POA: Insufficient documentation

## 2015-08-18 DIAGNOSIS — R109 Unspecified abdominal pain: Secondary | ICD-10-CM

## 2015-08-18 DIAGNOSIS — Z90411 Acquired partial absence of pancreas: Secondary | ICD-10-CM | POA: Insufficient documentation

## 2015-08-18 DIAGNOSIS — I10 Essential (primary) hypertension: Secondary | ICD-10-CM | POA: Insufficient documentation

## 2015-08-18 DIAGNOSIS — Z79899 Other long term (current) drug therapy: Secondary | ICD-10-CM | POA: Diagnosis not present

## 2015-08-18 DIAGNOSIS — Z87448 Personal history of other diseases of urinary system: Secondary | ICD-10-CM | POA: Insufficient documentation

## 2015-08-18 DIAGNOSIS — R1033 Periumbilical pain: Secondary | ICD-10-CM | POA: Insufficient documentation

## 2015-08-18 DIAGNOSIS — R1031 Right lower quadrant pain: Secondary | ICD-10-CM | POA: Diagnosis present

## 2015-08-18 DIAGNOSIS — Z8719 Personal history of other diseases of the digestive system: Secondary | ICD-10-CM | POA: Diagnosis not present

## 2015-08-18 DIAGNOSIS — R110 Nausea: Secondary | ICD-10-CM | POA: Insufficient documentation

## 2015-08-18 HISTORY — DX: Acute embolism and thrombosis of unspecified deep veins of unspecified lower extremity: I82.409

## 2015-08-18 LAB — COMPREHENSIVE METABOLIC PANEL
ALBUMIN: 4.3 g/dL (ref 3.5–5.0)
ALT: 26 U/L (ref 17–63)
AST: 29 U/L (ref 15–41)
Alkaline Phosphatase: 92 U/L (ref 38–126)
Anion gap: 7 (ref 5–15)
BUN: 18 mg/dL (ref 6–20)
CHLORIDE: 106 mmol/L (ref 101–111)
CO2: 23 mmol/L (ref 22–32)
Calcium: 9.3 mg/dL (ref 8.9–10.3)
Creatinine, Ser: 1.74 mg/dL — ABNORMAL HIGH (ref 0.61–1.24)
GFR calc Af Amer: 53 mL/min — ABNORMAL LOW (ref >60)
GFR calc non Af Amer: 45 mL/min — ABNORMAL LOW (ref >60)
GLUCOSE: 141 mg/dL — AB (ref 65–99)
POTASSIUM: 3.8 mmol/L (ref 3.5–5.1)
SODIUM: 136 mmol/L (ref 135–145)
Total Bilirubin: 0.7 mg/dL (ref 0.3–1.2)
Total Protein: 7.9 g/dL (ref 6.5–8.1)

## 2015-08-18 LAB — CBC WITH DIFFERENTIAL/PLATELET
Basophils Absolute: 0 10*3/uL (ref 0.0–0.1)
Basophils Relative: 0 %
EOS PCT: 2 %
Eosinophils Absolute: 0.1 10*3/uL (ref 0.0–0.7)
HEMATOCRIT: 38.4 % — AB (ref 39.0–52.0)
Hemoglobin: 12.7 g/dL — ABNORMAL LOW (ref 13.0–17.0)
LYMPHS ABS: 1.1 10*3/uL (ref 0.7–4.0)
LYMPHS PCT: 33 %
MCH: 27.1 pg (ref 26.0–34.0)
MCHC: 33.1 g/dL (ref 30.0–36.0)
MCV: 81.9 fL (ref 78.0–100.0)
MONO ABS: 0.2 10*3/uL (ref 0.1–1.0)
Monocytes Relative: 7 %
Neutro Abs: 2 10*3/uL (ref 1.7–7.7)
Neutrophils Relative %: 58 %
PLATELETS: 147 10*3/uL — AB (ref 150–400)
RBC: 4.69 MIL/uL (ref 4.22–5.81)
RDW: 16 % — AB (ref 11.5–15.5)
WBC: 3.4 10*3/uL — ABNORMAL LOW (ref 4.0–10.5)

## 2015-08-18 LAB — LIPASE, BLOOD: Lipase: 28 U/L (ref 11–51)

## 2015-08-18 MED ORDER — HYDROMORPHONE HCL 1 MG/ML IJ SOLN
1.0000 mg | Freq: Once | INTRAMUSCULAR | Status: AC
  Administered 2015-08-18: 1 mg via INTRAVENOUS
  Filled 2015-08-18: qty 1

## 2015-08-18 MED ORDER — DIPHENHYDRAMINE HCL 50 MG/ML IJ SOLN
25.0000 mg | Freq: Once | INTRAMUSCULAR | Status: AC
  Administered 2015-08-18: 25 mg via INTRAVENOUS

## 2015-08-18 MED ORDER — OXYCODONE-ACETAMINOPHEN 5-325 MG PO TABS: 1.0000 | ORAL_TABLET | ORAL | Status: DC | PRN

## 2015-08-18 MED ORDER — PROMETHAZINE HCL 25 MG PO TABS: 25.0000 mg | ORAL_TABLET | Freq: Four times a day (QID) | ORAL | Status: DC | PRN

## 2015-08-18 MED ORDER — HYDROMORPHONE HCL 1 MG/ML IJ SOLN
1.0000 mg | Freq: Once | INTRAMUSCULAR | Status: AC
Start: 2015-08-18 — End: 2015-08-18
  Administered 2015-08-18: 1 mg via INTRAVENOUS
  Filled 2015-08-18: qty 1

## 2015-08-18 MED ORDER — ONDANSETRON HCL 4 MG/2ML IJ SOLN
4.0000 mg | Freq: Once | INTRAMUSCULAR | Status: AC
  Administered 2015-08-18: 4 mg via INTRAVENOUS
  Filled 2015-08-18: qty 2

## 2015-08-18 MED ORDER — DIPHENHYDRAMINE HCL 50 MG/ML IJ SOLN
INTRAMUSCULAR | Status: AC
  Filled 2015-08-18: qty 1

## 2015-08-18 MED ORDER — SODIUM CHLORIDE 0.9 % IV BOLUS (SEPSIS)
1000.0000 mL | Freq: Once | INTRAVENOUS | Status: AC
Start: 2015-08-18 — End: 2015-08-18
  Administered 2015-08-18: 1000 mL via INTRAVENOUS

## 2015-08-18 NOTE — ED Notes (Signed)
Pt c/o itching, feels it is due to pain med, EDP notified. Airway patent.

## 2015-08-18 NOTE — ED Notes (Signed)
Patient d/c papers and prescription given and reviewed.  Patient verbalized understanding. Patient ambulatory out of dept. With wife.

## 2015-08-18 NOTE — Discharge Instructions (Signed)
Medication for pain and nausea. [facility]

## 2015-08-18 NOTE — ED Notes (Signed)
PT c/o RLQ and epigastric abdominal pain with nausea and diarrhea x2 days. PT denies any recent antibiotic use. PT denies any urinary symptoms.

## 2015-08-18 NOTE — ED Notes (Signed)
MD at bedside. 

## 2015-08-18 NOTE — ED Provider Notes (Signed)
CSN: [id number]     Arrival date & time 08/18/15  [id number] History  By signing my name below, I, Murriel HopperAlec Bankhead, attest that this documentation has been prepared under the direction and in the presence of Donnetta HutchingBrian Donat Humble, MD. Electronically Signed: Murriel HopperAlec Bankhead, ED Scribe. 08/18/2015. 10:03 AM.      Chief Complaint  Patient presents with  . Abdominal Pain      The history is provided by the patient. No language interpreter was used.   HPI Comments: Peter [name] is a 44 y.o. male with a hx of pancreatitis, renal disorder, HTN who presents to the Emergency Department complaining of constant RLQ and epigastric abdominal pain with associated nausea and diarrhea for two days. 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 Pt states he has bowel movements. Pt states he has not eaten today, and reports he ran out of Oxycodone yesterday. Pt takes Oxycodone for his pain as needed.    Past Medical History  Diagnosis Date  . Hypertension   . Ileus (HCC) 01/03/2012  . Retroperitoneal bleed 01/04/2012  . Pancreatitis   . Renal disorder     kidney stones  . DVT (deep venous thrombosis) (HCC)     lower extremity   Past Surgical History  Procedure Laterality Date  . Hernia repair    . Pancreas surgery      half of pancreas removed  . Lithotripsy    . Colon resection    . Colostomy reversal    . Chronic pancreatitiis    . Pancreatitic fistula    . Sbo     History reviewed. No pertinent family history. Social History  Substance Use Topics  . Smoking status: Never Smoker   . Smokeless tobacco: None  . Alcohol Use: No     Comment: denies today 07/21/2015    Review of Systems  A complete 10 system review of systems was obtained and all systems are negative except as noted in the HPI and PMH.    Allergies  Ace inhibitors; Coconut flavor; Contrast  media; and Iodine  Home Medications   Prior to Admission medications   Medication Sig Start Date End Date Taking? Authorizing Provider  amitriptyline (ELAVIL) 150 MG tablet Take 150 mg by mouth at bedtime.  11/30/14  Yes Historical Provider, MD  CREON 36000 UNITS CPEP capsule Take 36,000-108,000 Units by mouth 3 (three) times daily with meals. 1 with snacks and 3 with meals 12/11/14  Yes Historical Provider, MD  folic acid (FOLVITE) 1 MG tablet Take 1 mg by mouth daily. 04/13/15  Yes Historical Provider, MD  hydrochlorothiazide (HYDRODIURIL) 25 MG tablet Take 25 mg by mouth daily.   Yes Historical Provider, MD  HYDROcodone-acetaminophen (NORCO/VICODIN) 5-325 MG per tablet Take 2 tablets by mouth every 4 (four) hours as needed. Patient taking differently: Take 2 tablets by mouth every 4 (four) hours as needed for moderate pain.  05/16/15  Yes Rolland PorterMark James, MD  iron polysaccharides (NIFEREX) 150 MG capsule Take 150 mg by mouth 2 (two) times daily.   Yes Historical Provider, MD  magnesium oxide (MAG-OX) 400 (241.3 MG) MG tablet Take 1 tablet by mouth 2 (two) times daily. 01/13/15  Yes Historical Provider, MD  metoCLOPramide (REGLAN) 10 MG tablet Take 1 tablet (10 mg total) by mouth every 6 (six) hours as needed for nausea. 07/28/15  Yes Dione Booze, MD  metoprolol (LOPRESSOR) 50 MG tablet Take 50 mg by mouth 2 (two) times daily. Take 1 tablet (50 mg total) by mouth 2 times daily. 08/08/12  Yes Historical Provider, MD  ondansetron (ZOFRAN ODT) 4 MG disintegrating tablet Take 1 tablet (4 mg total) by mouth every 8 (eight) hours as needed for nausea. 05/16/15  Yes Rolland Porter, MD  Oxycodone HCl 10 MG TABS Take 10-20 mg by mouth every 4 (four) hours as needed. For pain 01/21/15  Yes Historical Provider, MD  topiramate (TOPAMAX) 50 MG tablet Take 50-100 mg by mouth 2 (two) times daily. 1 tablet in the Am and 2 tablets at night   Yes Historical Provider, MD  vitamin B-12 (CYANOCOBALAMIN) 1000 MCG tablet Take by mouth 2  (two) times a week.   Yes Historical Provider, MD  oxyCODONE-acetaminophen (PERCOCET) 5-325 MG tablet Take 1-2 tablets by mouth every 4 (four) hours as needed. 08/18/15   Donnetta Hutching, MD  promethazine (PHENERGAN) 25 MG tablet Take 1 tablet (25 mg total) by mouth every 6 (six) hours as needed. 08/18/15   Donnetta Hutching, MD   BP 115/95 mmHg  Pulse 73  Temp(Src) 98.2 F (36.8 C)  Resp 18  Ht  (1.778 m)  Wt 220 lb (99.791 kg)  BMI 31.57 kg/m2  SpO2 100% Physical Exam  Constitutional: He is oriented to person, place, and time. He appears well-developed and well-nourished.  HENT:  Head: Normocephalic and atraumatic.  Eyes: Conjunctivae and EOM are normal. Pupils are equal, round, and reactive to light.  Neck: Normal range of motion. Neck supple.  Cardiovascular: Normal rate and regular rhythm.   Pulmonary/Chest: Effort normal and breath sounds normal.  Abdominal: Soft. Bowel sounds are normal. There is tenderness.  Tender to right lower abdomen and left of umbilicus  Pt has abdominal right-sided mucous fistula secondary to pancreatitis Pt reports he has bowel movements    Musculoskeletal: Normal range of motion.  Neurological: He is alert and oriented to person, place, and time.  Skin: Skin is warm and dry.  Psychiatric: He has a normal mood and affect. His behavior is normal.  Nursing note and vitals reviewed.   ED Course  Procedures (including critical care time) DIAGNOSTIC STUDIES: Oxygen Saturation is 100% on room air, normal by my interpretation.    COORDINATION OF CARE: 10:00 AM Discussed treatment plan with pt at bedside and pt agreed to plan. Pt will receive an abdominal x-ray and IV fluids.    Labs Review Labs Reviewed  COMPREHENSIVE METABOLIC PANEL - Abnormal; Notable for the following:    Glucose, Bld 141 (*)    Creatinine, Ser 1.74 (*)    GFR calc non Af Amer 45 (*)    GFR calc Af Amer 53 (*)    All other components within normal limits  CBC WITH  DIFFERENTIAL/PLATELET - Abnormal; Notable for the following:    WBC 3.4 (*)    Hemoglobin 12.7 (*)    HCT 38.4 (*)    RDW 16.0 (*)    Platelets 147 (*)    All other components within normal limits  LIPASE, BLOOD    Imaging Review Ct Abdomen Pelvis Wo Contrast  08/18/2015  CLINICAL DATA:  Upper abdomen and mid back pain severe for several days. Patient has a history of pancreatitis. EXAM: CT ABDOMEN AND PELVIS WITHOUT CONTRAST TECHNIQUE: Multidetector CT imaging of the abdomen and pelvis was performed following the standard protocol without IV contrast. COMPARISON:  July 21, 2015  FINDINGS: The pancreas is new unremarkable without peripancreatic inflammatory change. Tiny focus of gas is identified in the left anterior para renal region unchanged. Previously noted inflammatory change/soft tissue in the region of pancreas extending into the anterior para renal and lateral Conal spaces are unchanged. The liver, spleen, gallbladder, adrenal glands are normal. There is no nephrolithiasis or hydroureteronephrosis bilaterally. IVC filter is unchanged. Minimal atherosclerosis of the aorta is identified without aneurysmal dilatation. Right lower quadrant ostomy is identified unchanged. Patient is status post prior surgical changes of the stomach. Single focal mild dilated small bowel loop is identified in the mid upper abdomen. Fluid-filled bladder is normal. Pelvic phleboliths are identified. The lung bases are clear. Degenerative joint changes of thoracic spine and L5-S1 level are noted. IMPRESSION: Postsurgical changes of the bowel with right lower quadrant ostomy. There is a single focal mild dilated small bowel loop in the mid upper abdomen, probably due to focal ileus. Chronic changes of the pancreas and retroperitoneum stable compared to prior exam. No evidence of acute pancreatitis. Electronically Signed   By: Sherian Rein M.D.   On: 08/18/2015 15:43   Dg Abd Acute W/chest  08/18/2015  CLINICAL  DATA:  Abdominal pain with nausea.  History of pancreatitis EXAM: DG ABDOMEN ACUTE W/ 1V CHEST COMPARISON:  CT abdomen and pelvis July 21, 2015; chest radiograph November 21, 2012 FINDINGS: PA chest: There is no edema or consolidation. The heart size and pulmonary vascularity are normal. No adenopathy. Supine and upright abdomen: There is a right lower quadrant ostomy. There is mild bowel dilatation in the mid abdomen. No free air. No appreciable air-fluid levels. Inferior vena cava filter present with the apex of the filter directed superiorly at the level of L2. IMPRESSION: Mild bowel dilatation. Suspect a degree of ileus. No free air. Right lower quadrant ostomy noted. No lung edema or consolidation. Electronically Signed   By: Bretta Bang III M.D.   On: 08/18/2015 11:02   I have personally reviewed and evaluated these images and lab results as part of my medical decision-making.   EKG Interpretation None      MDM   Final diagnoses:  Abdominal pain, unspecified abdominal location    Patient is hemodynamically stable. Vital signs are normal. CT abdomen pelvis shows no acute findings. No evidence of acute pancreatitis. Discharge medications Percocet and Phenergan 25 mg. Patient encouraged to follow-up at [facility].  I personally performed the services described in this documentation, which was scribed in my presence. The recorded information has been reviewed and is accurate.     Donnetta Hutching, MD 08/18/15 [phone]

## 2015-08-18 NOTE — ED Notes (Signed)
Patient c/o left lower back pain, EDP notified.

## 2015-09-06 ENCOUNTER — Emergency Department (HOSPITAL_COMMUNITY)
Admission: EM | Admit: 2015-09-06 | Discharge: 2015-09-06 | Disposition: A | Discharge status: Home / Self Care | Payer: Medicare Other | Attending: Emergency Medicine | Admitting: Emergency Medicine

## 2015-09-06 ENCOUNTER — Encounter (HOSPITAL_COMMUNITY): Payer: Self-pay | Admitting: Emergency Medicine

## 2015-09-06 DIAGNOSIS — R42 Dizziness and giddiness: Secondary | ICD-10-CM | POA: Insufficient documentation

## 2015-09-06 DIAGNOSIS — Z8719 Personal history of other diseases of the digestive system: Secondary | ICD-10-CM | POA: Diagnosis not present

## 2015-09-06 DIAGNOSIS — I10 Essential (primary) hypertension: Secondary | ICD-10-CM | POA: Insufficient documentation

## 2015-09-06 DIAGNOSIS — M545 Low back pain: Secondary | ICD-10-CM | POA: Diagnosis not present

## 2015-09-06 DIAGNOSIS — R109 Unspecified abdominal pain: Secondary | ICD-10-CM | POA: Insufficient documentation

## 2015-09-06 DIAGNOSIS — Z86718 Personal history of other venous thrombosis and embolism: Secondary | ICD-10-CM | POA: Insufficient documentation

## 2015-09-06 DIAGNOSIS — G8929 Other chronic pain: Secondary | ICD-10-CM | POA: Diagnosis not present

## 2015-09-06 DIAGNOSIS — Z79899 Other long term (current) drug therapy: Secondary | ICD-10-CM | POA: Insufficient documentation

## 2015-09-06 DIAGNOSIS — R1013 Epigastric pain: Secondary | ICD-10-CM | POA: Diagnosis present

## 2015-09-06 DIAGNOSIS — Z87442 Personal history of urinary calculi: Secondary | ICD-10-CM | POA: Diagnosis not present

## 2015-09-06 DIAGNOSIS — Z9049 Acquired absence of other specified parts of digestive tract: Secondary | ICD-10-CM | POA: Diagnosis not present

## 2015-09-06 LAB — COMPREHENSIVE METABOLIC PANEL
ALT: 29 U/L (ref 17–63)
ANION GAP: 8 (ref 5–15)
AST: 23 U/L (ref 15–41)
Albumin: 4.2 g/dL (ref 3.5–5.0)
Alkaline Phosphatase: 96 U/L (ref 38–126)
BILIRUBIN TOTAL: 0.7 mg/dL (ref 0.3–1.2)
BUN: 17 mg/dL (ref 6–20)
CO2: 25 mmol/L (ref 22–32)
Calcium: 8.9 mg/dL (ref 8.9–10.3)
Chloride: 106 mmol/L (ref 101–111)
Creatinine, Ser: 1.63 mg/dL — ABNORMAL HIGH (ref 0.61–1.24)
GFR, EST AFRICAN AMERICAN: 57 mL/min — AB (ref >60)
GFR, EST NON AFRICAN AMERICAN: 49 mL/min — AB (ref >60)
Glucose, Bld: 113 mg/dL — ABNORMAL HIGH (ref 65–99)
POTASSIUM: 3.7 mmol/L (ref 3.5–5.1)
Sodium: 139 mmol/L (ref 135–145)
TOTAL PROTEIN: 7.7 g/dL (ref 6.5–8.1)

## 2015-09-06 LAB — CBC
HCT: 39.2 % (ref 39.0–52.0)
Hemoglobin: 12.7 g/dL — ABNORMAL LOW (ref 13.0–17.0)
MCH: 26.9 pg (ref 26.0–34.0)
MCHC: 32.4 g/dL (ref 30.0–36.0)
MCV: 83.1 fL (ref 78.0–100.0)
Platelets: 128 10*3/uL — ABNORMAL LOW (ref 150–400)
RBC: 4.72 MIL/uL (ref 4.22–5.81)
RDW: 15.8 % — ABNORMAL HIGH (ref 11.5–15.5)
WBC: 3.4 10*3/uL — ABNORMAL LOW (ref 4.0–10.5)

## 2015-09-06 LAB — URINALYSIS, ROUTINE W REFLEX MICROSCOPIC
Bilirubin Urine: NEGATIVE
GLUCOSE, UA: NEGATIVE mg/dL
HGB URINE DIPSTICK: NEGATIVE
LEUKOCYTES UA: NEGATIVE
Nitrite: NEGATIVE
PH: 6 (ref 5.0–8.0)
PROTEIN: NEGATIVE mg/dL
SPECIFIC GRAVITY, URINE: 1.02 (ref 1.005–1.030)

## 2015-09-06 LAB — LIPASE, BLOOD: Lipase: 30 U/L (ref 11–51)

## 2015-09-06 MED ORDER — MORPHINE SULFATE (PF) 2 MG/ML IV SOLN
4.0000 mg | Freq: Once | INTRAVENOUS | Status: AC
  Administered 2015-09-06: 4 mg via INTRAVENOUS
  Filled 2015-09-06: qty 2

## 2015-09-06 MED ORDER — DIPHENHYDRAMINE HCL 50 MG/ML IJ SOLN
25.0000 mg | Freq: Once | INTRAMUSCULAR | Status: AC
  Administered 2015-09-06: 25 mg via INTRAVENOUS
  Filled 2015-09-06: qty 1

## 2015-09-06 MED ORDER — SODIUM CHLORIDE 0.9 % IV BOLUS (SEPSIS)
500.0000 mL | Freq: Once | INTRAVENOUS | Status: AC
  Administered 2015-09-06: 500 mL via INTRAVENOUS

## 2015-09-06 MED ORDER — SODIUM CHLORIDE 0.9 % IV SOLN
INTRAVENOUS | Status: DC
  Administered 2015-09-06: 18:00:00 via INTRAVENOUS

## 2015-09-06 MED ORDER — PROMETHAZINE HCL 25 MG/ML IJ SOLN
12.5000 mg | Freq: Once | INTRAMUSCULAR | Status: AC
  Administered 2015-09-06: 12.5 mg via INTRAVENOUS
  Filled 2015-09-06 (×2): qty 1

## 2015-09-06 NOTE — ED Provider Notes (Signed)
CSN: [id number]     Arrival date & time 09/06/15  1514 History   First MD Initiated Contact with Patient 09/06/15 1642     Chief Complaint  Patient presents with  . Abdominal Pain     (Consider location/radiation/quality/duration/timing/severity/associated sxs/prior Treatment) Patient is a 44 y.o. male presenting with abdominal pain. The history is provided by the patient.  Abdominal Pain Associated symptoms: no chest pain, no dysuria, no fever, no nausea, no shortness of breath and no vomiting    patient with a history of chronic abdominal pain. Patient with problems with bouts of pancreatitis in the past. Patient had pancreatectomy in 2013 still followed closely by surgeon at [facility]. Has follow-up there on December 5. Patient with epigastric left upper quadrant radiating around to the back pain is been present for 3-4 days. Associated with some dizziness but no nausea or vomiting or diarrhea. Patient states pain is 6 out of 10. More of a gnawing pain in the usual ache.  Past Medical History  Diagnosis Date  . Hypertension   . Ileus (HCC) 01/03/2012  . Retroperitoneal bleed 01/04/2012  . Pancreatitis   . Renal disorder     kidney stones  . DVT (deep venous thrombosis) (HCC)     lower extremity   Past Surgical History  Procedure Laterality Date  . Hernia repair    . Pancreas surgery      half of pancreas removed  . Lithotripsy    . Colon resection    . Colostomy reversal    . Chronic pancreatitiis    . Pancreatitic fistula    . Sbo     No family history on file. Social History  Substance Use Topics  . Smoking status: Never Smoker   . Smokeless tobacco: None  . Alcohol Use: No     Comment: denies today 07/21/2015    Review of Systems  Constitutional: Negative for fever.  HENT: Negative for congestion.   Eyes: Negative for redness.  Respiratory: Negative for shortness of breath.   Cardiovascular: Negative for chest pain.  Gastrointestinal: Positive for abdominal  pain. Negative for nausea and vomiting.  Genitourinary: Negative for dysuria.  Musculoskeletal: Positive for back pain.  Skin: Negative for rash.  Neurological: Positive for dizziness.  Hematological: Does not bruise/bleed easily.  Psychiatric/Behavioral: Negative for confusion.      Allergies  Ace inhibitors; Coconut flavor; Contrast media; and Iodine  Home Medications   Prior to Admission medications   Medication Sig Start Date End Date Taking? Authorizing Provider  amitriptyline (ELAVIL) 150 MG tablet Take 150 mg by mouth at bedtime.  11/30/14  Yes Historical Provider, MD  CREON 36000 UNITS CPEP capsule Take 36,000-108,000 Units by mouth 3 (three) times daily with meals. 1 with snacks and 3 with meals 12/11/14  Yes Historical Provider, MD  folic acid (FOLVITE) 1 MG tablet Take 1 mg by mouth daily. 04/13/15  Yes Historical Provider, MD  hydrochlorothiazide (HYDRODIURIL) 25 MG tablet Take 25 mg by mouth daily.   Yes Historical Provider, MD  HYDROcodone-acetaminophen (NORCO/VICODIN) 5-325 MG per tablet Take 2 tablets by mouth every 4 (four) hours as needed. Patient taking differently: Take 2 tablets by mouth every 4 (four) hours as needed for moderate pain.  05/16/15  Yes Rolland Porter, MD  iron polysaccharides (NIFEREX) 150 MG capsule Take 150 mg by mouth 2 (two) times daily.   Yes Historical Provider, MD  magnesium oxide (MAG-OX) 400 (241.3 MG) MG tablet Take 1 tablet by mouth 2 (two) times  daily. 01/13/15  Yes Historical Provider, MD  metoCLOPramide (REGLAN) 10 MG tablet Take 1 tablet (10 mg total) by mouth every 6 (six) hours as needed for nausea. 07/28/15  Yes Dione Boozeavid Glick, MD  metoprolol (LOPRESSOR) 50 MG tablet Take 50 mg by mouth 2 (two) times daily. Take 1 tablet (50 mg total) by mouth 2 times daily. 08/08/12  Yes Historical Provider, MD  Oxycodone HCl 10 MG TABS Take 10-20 mg by mouth every 4 (four) hours as needed. For pain 01/21/15  Yes Historical Provider, MD  topiramate (TOPAMAX) 50 MG  tablet Take 50-100 mg by mouth 2 (two) times daily. 1 tablet in the Am and 2 tablets at night   Yes Historical Provider, MD  vitamin B-12 (CYANOCOBALAMIN) 1000 MCG tablet Take by mouth 2 (two) times a week.   Yes Historical Provider, MD  ondansetron (ZOFRAN ODT) 4 MG disintegrating tablet Take 1 tablet (4 mg total) by mouth every 8 (eight) hours as needed for nausea. Patient not taking: Reported on 09/06/2015 05/16/15   Rolland PorterMark James, MD  oxyCODONE-acetaminophen (PERCOCET) 5-325 MG tablet Take 1-2 tablets by mouth every 4 (four) hours as needed. Patient not taking: Reported on 09/06/2015 08/18/15   Donnetta HutchingBrian Cook, MD  promethazine (PHENERGAN) 25 MG tablet Take 1 tablet (25 mg total) by mouth every 6 (six) hours as needed. Patient not taking: Reported on 09/06/2015 08/18/15   Donnetta HutchingBrian Cook, MD   BP 159/87 mmHg  Pulse 105  Temp(Src) 98.3 F (36.8 C) (Oral)  Resp 18  Ht 5\' 10"  (1.778 m)  Wt 102.059 kg  BMI 32.28 kg/m2  SpO2 100% Physical Exam  Constitutional: He is oriented to person, place, and time. He appears well-developed and well-nourished. No distress.  HENT:  Head: Normocephalic and atraumatic.  Mouth/Throat: Oropharynx is clear and moist.  Eyes: EOM are normal. Pupils are equal, round, and reactive to light.  Neck: Normal range of motion.  Cardiovascular: Normal rate, regular rhythm and normal heart sounds.   Pulmonary/Chest: Effort normal and breath sounds normal. No respiratory distress.  Abdominal: Soft. Bowel sounds are normal. There is no tenderness.  Healthy-appearing mucous fistula of right side of the abdomen.  Musculoskeletal: Normal range of motion.  Neurological: He is alert and oriented to person, place, and time. No cranial nerve deficit. He exhibits normal muscle tone. Coordination normal.  Skin: Skin is warm.  Nursing note and vitals reviewed.   ED Course  Procedures (including critical care time) Labs Review Labs Reviewed  COMPREHENSIVE METABOLIC PANEL - Abnormal;  Notable for the following:    Glucose, Bld 113 (*)    Creatinine, Ser 1.63 (*)    GFR calc non Af Amer 49 (*)    GFR calc Af Amer 57 (*)    All other components within normal limits  CBC - Abnormal; Notable for the following:    WBC 3.4 (*)    Hemoglobin 12.7 (*)    RDW 15.8 (*)    Platelets 128 (*)    All other components within normal limits  URINALYSIS, ROUTINE W REFLEX MICROSCOPIC (NOT AT [facility]) - Abnormal; Notable for the following:    Ketones, ur TRACE (*)    All other components within normal limits  LIPASE, BLOOD   Results for orders placed or performed during the hospital encounter of 09/06/15  Lipase, blood  Result Value Ref Range   Lipase 30 11 - 51 U/L  Comprehensive metabolic panel  Result Value Ref Range   Sodium 139 135 - 145  mmol/L   Potassium 3.7 3.5 - 5.1 mmol/L   Chloride 106 101 - 111 mmol/L   CO2 25 22 - 32 mmol/L   Glucose, Bld 113 (H) 65 - 99 mg/dL   BUN 17 6 - 20 mg/dL   Creatinine, Ser 4.54 (H) 0.61 - 1.24 mg/dL   Calcium 8.9 8.9 - 09.8 mg/dL   Total Protein 7.7 6.5 - 8.1 g/dL   Albumin 4.2 3.5 - 5.0 g/dL   AST 23 15 - 41 U/L   ALT 29 17 - 63 U/L   Alkaline Phosphatase 96 38 - 126 U/L   Total Bilirubin 0.7 0.3 - 1.2 mg/dL   GFR calc non Af Amer 49 (L) >60 mL/min   GFR calc Af Amer 57 (L) >60 mL/min   Anion gap 8 5 - 15  CBC  Result Value Ref Range   WBC 3.4 (L) 4.0 - 10.5 K/uL   RBC 4.72 4.22 - 5.81 MIL/uL   Hemoglobin 12.7 (L) 13.0 - 17.0 g/dL   HCT 11.9 14.7 - 82.9 %   MCV 83.1 78.0 - 100.0 fL   MCH 26.9 26.0 - 34.0 pg   MCHC 32.4 30.0 - 36.0 g/dL   RDW 56.2 (H) 13.0 - 86.5 %   Platelets 128 (L) 150 - 400 K/uL  Urinalysis, Routine w reflex microscopic (not at [facility])  Result Value Ref Range   Color, Urine YELLOW YELLOW   APPearance CLEAR CLEAR   Specific Gravity, Urine 1.020 1.005 - 1.030   pH 6.0 5.0 - 8.0   Glucose, UA NEGATIVE NEGATIVE mg/dL   Hgb urine dipstick NEGATIVE NEGATIVE   Bilirubin Urine NEGATIVE NEGATIVE   Ketones, ur  TRACE (A) NEGATIVE mg/dL   Protein, ur NEGATIVE NEGATIVE mg/dL   Nitrite NEGATIVE NEGATIVE   Leukocytes, UA NEGATIVE NEGATIVE     Imaging Review No results found. I have personally reviewed and evaluated these images and lab results as part of my medical decision-making.   EKG Interpretation None      MDM   Final diagnoses:  Chronic abdominal pain   Patient with a history of chronic abdominal pain pancreatitis renal disorder and hypertension. Patient with complaint of epigastric left upper quadrant abdominal pain similar pain in the past. Patient had a partial of pancreatectomy and 2013 followed closely by surgeon at [facility]. Has follow-up there on December 5. Patient's labs today without any sniffing abnormalities. Patient treated here with Phenergan and morphine with improvement in the pain. Patient nontoxic no acute distress. Patient with a mucous fistula in the right side of his abdomen without any abnormalities.    Vanetta Mulders, MD 09/06/15 [phone]

## 2015-09-06 NOTE — Discharge Instructions (Signed)
Return for any new or worse symptoms. Keep your appointment with your surgeon at [facility] as scheduled for December 5. Today's workup without any significant findings.

## 2015-09-06 NOTE — ED Notes (Signed)
Pt reports abdominal pain that radiates to lower back and diarrhea x 3 days.

## 2015-09-06 NOTE — ED Notes (Signed)
Patient complains of itching requesting benadryl.

## 2015-09-17 ENCOUNTER — Encounter (HOSPITAL_COMMUNITY): Payer: Self-pay

## 2015-09-17 ENCOUNTER — Emergency Department (HOSPITAL_COMMUNITY)
Admission: EM | Admit: 2015-09-17 | Discharge: 2015-09-17 | Disposition: A | Discharge status: Home / Self Care | Payer: Medicare Other | Attending: Emergency Medicine | Admitting: Emergency Medicine

## 2015-09-17 DIAGNOSIS — R109 Unspecified abdominal pain: Secondary | ICD-10-CM | POA: Diagnosis present

## 2015-09-17 DIAGNOSIS — Z90411 Acquired partial absence of pancreas: Secondary | ICD-10-CM | POA: Insufficient documentation

## 2015-09-17 DIAGNOSIS — Z86718 Personal history of other venous thrombosis and embolism: Secondary | ICD-10-CM | POA: Insufficient documentation

## 2015-09-17 DIAGNOSIS — Z87442 Personal history of urinary calculi: Secondary | ICD-10-CM | POA: Insufficient documentation

## 2015-09-17 DIAGNOSIS — G8929 Other chronic pain: Secondary | ICD-10-CM | POA: Diagnosis not present

## 2015-09-17 DIAGNOSIS — Z79899 Other long term (current) drug therapy: Secondary | ICD-10-CM | POA: Insufficient documentation

## 2015-09-17 DIAGNOSIS — I10 Essential (primary) hypertension: Secondary | ICD-10-CM | POA: Diagnosis not present

## 2015-09-17 DIAGNOSIS — Z8719 Personal history of other diseases of the digestive system: Secondary | ICD-10-CM | POA: Diagnosis not present

## 2015-09-17 DIAGNOSIS — Z9049 Acquired absence of other specified parts of digestive tract: Secondary | ICD-10-CM | POA: Diagnosis not present

## 2015-09-17 LAB — URINALYSIS, ROUTINE W REFLEX MICROSCOPIC
Bilirubin Urine: NEGATIVE
GLUCOSE, UA: NEGATIVE mg/dL
Hgb urine dipstick: NEGATIVE
KETONES UR: NEGATIVE mg/dL
LEUKOCYTES UA: NEGATIVE
Nitrite: NEGATIVE
PH: 6 (ref 5.0–8.0)
Protein, ur: NEGATIVE mg/dL
Specific Gravity, Urine: 1.02 (ref 1.005–1.030)

## 2015-09-17 LAB — RAPID URINE DRUG SCREEN, HOSP PERFORMED
AMPHETAMINES: NOT DETECTED
BENZODIAZEPINES: NOT DETECTED
Barbiturates: NOT DETECTED
Cocaine: NOT DETECTED
Opiates: NOT DETECTED
Tetrahydrocannabinol: NOT DETECTED

## 2015-09-17 LAB — COMPREHENSIVE METABOLIC PANEL
ALBUMIN: 4.4 g/dL (ref 3.5–5.0)
ALT: 26 U/L (ref 17–63)
AST: 23 U/L (ref 15–41)
Alkaline Phosphatase: 104 U/L (ref 38–126)
Anion gap: 8 (ref 5–15)
BUN: 18 mg/dL (ref 6–20)
CHLORIDE: 103 mmol/L (ref 101–111)
CO2: 27 mmol/L (ref 22–32)
CREATININE: 2.09 mg/dL — AB (ref 0.61–1.24)
Calcium: 9.7 mg/dL (ref 8.9–10.3)
GFR calc Af Amer: 42 mL/min — ABNORMAL LOW (ref >60)
GFR calc non Af Amer: 36 mL/min — ABNORMAL LOW (ref >60)
GLUCOSE: 121 mg/dL — AB (ref 65–99)
Potassium: 3.9 mmol/L (ref 3.5–5.1)
SODIUM: 138 mmol/L (ref 135–145)
Total Bilirubin: 0.7 mg/dL (ref 0.3–1.2)
Total Protein: 8 g/dL (ref 6.5–8.1)

## 2015-09-17 LAB — CBC WITH DIFFERENTIAL/PLATELET
BASOS ABS: 0 10*3/uL (ref 0.0–0.1)
Basophils Relative: 0 %
Eosinophils Absolute: 0.1 10*3/uL (ref 0.0–0.7)
Eosinophils Relative: 4 %
HEMATOCRIT: 41.2 % (ref 39.0–52.0)
Hemoglobin: 13.6 g/dL (ref 13.0–17.0)
LYMPHS ABS: 1.2 10*3/uL (ref 0.7–4.0)
LYMPHS PCT: 37 %
MCH: 27.8 pg (ref 26.0–34.0)
MCHC: 33 g/dL (ref 30.0–36.0)
MCV: 84.1 fL (ref 78.0–100.0)
MONO ABS: 0.4 10*3/uL (ref 0.1–1.0)
MONOS PCT: 12 %
NEUTROS ABS: 1.6 10*3/uL — AB (ref 1.7–7.7)
Neutrophils Relative %: 47 %
Platelets: 150 10*3/uL (ref 150–400)
RBC: 4.9 MIL/uL (ref 4.22–5.81)
RDW: 15.8 % — AB (ref 11.5–15.5)
WBC: 3.4 10*3/uL — ABNORMAL LOW (ref 4.0–10.5)

## 2015-09-17 LAB — ETHANOL: Alcohol, Ethyl (B): <5 mg/dL (ref <5)

## 2015-09-17 LAB — LIPASE, BLOOD: LIPASE: 28 U/L (ref 11–51)

## 2015-09-17 MED ORDER — DIPHENHYDRAMINE HCL 50 MG/ML IJ SOLN
25.0000 mg | Freq: Once | INTRAMUSCULAR | Status: AC
  Administered 2015-09-17: 25 mg via INTRAVENOUS
  Filled 2015-09-17: qty 1

## 2015-09-17 MED ORDER — PROMETHAZINE HCL 25 MG/ML IJ SOLN
12.5000 mg | Freq: Once | INTRAMUSCULAR | Status: AC
  Administered 2015-09-17: 12.5 mg via INTRAVENOUS
  Filled 2015-09-17: qty 1

## 2015-09-17 MED ORDER — SODIUM CHLORIDE 0.9 % IV SOLN
INTRAVENOUS | Status: DC
  Administered 2015-09-17: 08:00:00 via INTRAVENOUS

## 2015-09-17 MED ORDER — ONDANSETRON HCL 4 MG/2ML IJ SOLN
4.0000 mg | Freq: Once | INTRAMUSCULAR | Status: AC
  Administered 2015-09-17: 4 mg via INTRAVENOUS
  Filled 2015-09-17: qty 2

## 2015-09-17 MED ORDER — SODIUM CHLORIDE 0.9 % IV BOLUS (SEPSIS)
1000.0000 mL | Freq: Once | INTRAVENOUS | Status: AC
Start: 2015-09-17 — End: 2015-09-17
  Administered 2015-09-17: 1000 mL via INTRAVENOUS

## 2015-09-17 MED ORDER — SODIUM CHLORIDE 0.9 % IV BOLUS (SEPSIS)
500.0000 mL | Freq: Once | INTRAVENOUS | Status: AC
  Administered 2015-09-17: 500 mL via INTRAVENOUS

## 2015-09-17 MED ORDER — MORPHINE SULFATE (PF) 2 MG/ML IV SOLN
2.0000 mg | Freq: Once | INTRAVENOUS | Status: AC
  Administered 2015-09-17: 2 mg via INTRAVENOUS
  Filled 2015-09-17: qty 1

## 2015-09-17 NOTE — ED Notes (Signed)
Pt made aware to return if symptoms worsen or if any life threatening symptoms occur.   

## 2015-09-17 NOTE — ED Notes (Signed)
Nausea with loose stools from ostomy X2 days, and vomiting X1 yesterday. Patient states pain is under stoma on RUQ.

## 2015-09-17 NOTE — Discharge Instructions (Signed)
Follow-up with your doctors return for any new or worse symptoms. 

## 2015-09-17 NOTE — ED Notes (Signed)
MD at bedside. 

## 2015-09-17 NOTE — ED Provider Notes (Signed)
CSN: [id number]     Arrival date & time 09/17/15  8295 History   First MD Initiated Contact with Patient 09/17/15 0715     Chief Complaint  Patient presents with  . Abdominal Pain     (Consider location/radiation/quality/duration/timing/severity/associated sxs/prior Treatment) Patient is a 44 y.o. male presenting with abdominal pain. The history is provided by the patient.  Abdominal Pain Associated symptoms: no chest pain, no diarrhea, no dysuria, no fever, no nausea, no shortness of breath and no vomiting    patient with a history of chronic abdominal pain. Patient's had problems with bouts of pancreatitis in the past. Patient had pancreatitis take to me in 2013 still followed closely by [facility]. Patient presenting now with the abdominal pain predominately right-sided. Also concerned about the mucus fistula protruding out. Patient states pain is described as an ache. 5 out of 10. No nausea vomiting or diarrhea.  Past Medical History  Diagnosis Date  . Hypertension   . Ileus (HCC) 01/03/2012  . Retroperitoneal bleed 01/04/2012  . Pancreatitis   . Renal disorder     kidney stones  . DVT (deep venous thrombosis) (HCC)     lower extremity   Past Surgical History  Procedure Laterality Date  . Hernia repair    . Pancreas surgery      half of pancreas removed  . Lithotripsy    . Colon resection    . Colostomy reversal    . Chronic pancreatitiis    . Pancreatitic fistula    . Sbo     History reviewed. No pertinent family history. Social History  Substance Use Topics  . Smoking status: Never Smoker   . Smokeless tobacco: None  . Alcohol Use: No     Comment: denies today 07/21/2015    Review of Systems  Constitutional: Negative for fever.  HENT: Negative for congestion.   Eyes: Negative for visual disturbance.  Respiratory: Negative for shortness of breath.   Cardiovascular: Negative for chest pain.  Gastrointestinal: Positive for abdominal pain. Negative for  nausea, vomiting and diarrhea.  Genitourinary: Negative for dysuria.  Musculoskeletal: Negative for back pain.  Skin: Negative for rash.  Neurological: Negative for headaches.  Hematological: Does not bruise/bleed easily.  Psychiatric/Behavioral: Negative for confusion.      Allergies  Ace inhibitors; Coconut flavor; Contrast media; and Iodine  Home Medications   Prior to Admission medications   Medication Sig Start Date End Date Taking? Authorizing Provider  amitriptyline (ELAVIL) 150 MG tablet Take 150 mg by mouth at bedtime.  11/30/14  Yes Historical Provider, MD  CREON 36000 UNITS CPEP capsule Take 36,000-108,000 Units by mouth 3 (three) times daily with meals. 1 with snacks and 3 with meals 12/11/14  Yes Historical Provider, MD  folic acid (FOLVITE) 1 MG tablet Take 1 mg by mouth daily. 04/13/15  Yes Historical Provider, MD  hydrochlorothiazide (HYDRODIURIL) 25 MG tablet Take 25 mg by mouth daily.   Yes Historical Provider, MD  iron polysaccharides (NIFEREX) 150 MG capsule Take 150 mg by mouth 2 (two) times daily.   Yes Historical Provider, MD  magnesium oxide (MAG-OX) 400 (241.3 MG) MG tablet Take 1 tablet by mouth 2 (two) times daily. 01/13/15  Yes Historical Provider, MD  metoCLOPramide (REGLAN) 10 MG tablet Take 1 tablet (10 mg total) by mouth every 6 (six) hours as needed for nausea. 07/28/15  Yes Dione Booze, MD  metoprolol (LOPRESSOR) 50 MG tablet Take 50 mg by mouth 2 (two) times daily. Take 1 tablet (  50 mg total) by mouth 2 times daily. 08/08/12  Yes Historical Provider, MD  ondansetron (ZOFRAN ODT) 4 MG disintegrating tablet Take 1 tablet (4 mg total) by mouth every 8 (eight) hours as needed for nausea. 05/16/15  Yes Rolland Porter, MD  Oxycodone HCl 10 MG TABS Take 10-20 mg by mouth every 4 (four) hours as needed. For pain 01/21/15  Yes Historical Provider, MD  topiramate (TOPAMAX) 50 MG tablet Take 50-100 mg by mouth 2 (two) times daily. 1 tablet in the Am and 2 tablets at night   Yes  Historical Provider, MD  vitamin B-12 (CYANOCOBALAMIN) 1000 MCG tablet Take by mouth 2 (two) times a week.   Yes Historical Provider, MD  HYDROcodone-acetaminophen (NORCO/VICODIN) 5-325 MG per tablet Take 2 tablets by mouth every 4 (four) hours as needed. Patient taking differently: Take 2 tablets by mouth every 4 (four) hours as needed for moderate pain.  05/16/15   Rolland Porter, MD  oxyCODONE-acetaminophen (PERCOCET) 5-325 MG tablet Take 1-2 tablets by mouth every 4 (four) hours as needed. Patient not taking: Reported on 09/06/2015 08/18/15   Donnetta Hutching, MD  promethazine (PHENERGAN) 25 MG tablet Take 1 tablet (25 mg total) by mouth every 6 (six) hours as needed. Patient not taking: Reported on 09/06/2015 08/18/15   Donnetta Hutching, MD   BP 114/90 mmHg  Pulse 79  Temp(Src) 97.7 F (36.5 C) (Oral)  Resp 18  Ht  (1.778 m)  Wt 108.863 kg  BMI 34.44 kg/m2  SpO2 100% Physical Exam  Constitutional: He is oriented to person, place, and time. He appears well-developed and well-nourished. No distress.  HENT:  Head: Normocephalic and atraumatic.  Mouth/Throat: Oropharynx is clear and moist.  Eyes: Conjunctivae and EOM are normal. Pupils are equal, round, and reactive to light.  Neck: Normal range of motion. Neck supple.  Cardiovascular: Normal rate, regular rhythm and normal heart sounds.   No murmur heard. Pulmonary/Chest: Effort normal and breath sounds normal. No respiratory distress.  Abdominal: Soft. Bowel sounds are normal. He exhibits no distension.  Nontender. Mucous fistula right upper quadrant appears healthy and pink. Well-healed surgical scars.  Musculoskeletal: Normal range of motion.  Neurological: He is alert and oriented to person, place, and time. No cranial nerve deficit. He exhibits normal muscle tone. Coordination normal.  Skin: Skin is warm. No rash noted.  Nursing note and vitals reviewed.   ED Course  Procedures (including critical care time) Labs Review Labs  Reviewed  COMPREHENSIVE METABOLIC PANEL - Abnormal; Notable for the following:    Glucose, Bld 121 (*)    Creatinine, Ser 2.09 (*)    GFR calc non Af Amer 36 (*)    GFR calc Af Amer 42 (*)    All other components within normal limits  CBC WITH DIFFERENTIAL/PLATELET - Abnormal; Notable for the following:    WBC 3.4 (*)    RDW 15.8 (*)    Neutro Abs 1.6 (*)    All other components within normal limits  ETHANOL  LIPASE, BLOOD  URINALYSIS, ROUTINE W REFLEX MICROSCOPIC (NOT AT [facility])  URINE RAPID DRUG SCREEN, HOSP PERFORMED   Results for orders placed or performed during the hospital encounter of 09/17/15  Comprehensive metabolic panel  Result Value Ref Range   Sodium 138 135 - 145 mmol/L   Potassium 3.9 3.5 - 5.1 mmol/L   Chloride 103 101 - 111 mmol/L   CO2 27 22 - 32 mmol/L   Glucose, Bld 121 (H) 65 - 99 mg/dL  BUN 18 6 - 20 mg/dL   Creatinine, Ser 4.092.09 (H) 0.61 - 1.24 mg/dL   Calcium 9.7 8.9 - 81.110.3 mg/dL   Total Protein 8.0 6.5 - 8.1 g/dL   Albumin 4.4 3.5 - 5.0 g/dL   AST 23 15 - 41 U/L   ALT 26 17 - 63 U/L   Alkaline Phosphatase 104 38 - 126 U/L   Total Bilirubin 0.7 0.3 - 1.2 mg/dL   GFR calc non Af Amer 36 (L) >60 mL/min   GFR calc Af Amer 42 (L) >60 mL/min   Anion gap 8 5 - 15  Ethanol  Result Value Ref Range   Alcohol, Ethyl (B) <5 <5 mg/dL  Lipase, blood  Result Value Ref Range   Lipase 28 11 - 51 U/L  CBC with Differential  Result Value Ref Range   WBC 3.4 (L) 4.0 - 10.5 K/uL   RBC 4.90 4.22 - 5.81 MIL/uL   Hemoglobin 13.6 13.0 - 17.0 g/dL   HCT 91.441.2 78.239.0 - 95.652.0 %   MCV 84.1 78.0 - 100.0 fL   MCH 27.8 26.0 - 34.0 pg   MCHC 33.0 30.0 - 36.0 g/dL   RDW 21.315.8 (H) 08.611.5 - 57.815.5 %   Platelets 150 150 - 400 K/uL   Neutrophils Relative % 47 %   Neutro Abs 1.6 (L) 1.7 - 7.7 K/uL   Lymphocytes Relative 37 %   Lymphs Abs 1.2 0.7 - 4.0 K/uL   Monocytes Relative 12 %   Monocytes Absolute 0.4 0.1 - 1.0 K/uL   Eosinophils Relative 4 %   Eosinophils Absolute 0.1 0.0 -  0.7 K/uL   Basophils Relative 0 %   Basophils Absolute 0.0 0.0 - 0.1 K/uL  Urinalysis, Routine w reflex microscopic  Result Value Ref Range   Color, Urine YELLOW YELLOW   APPearance CLEAR CLEAR   Specific Gravity, Urine 1.020 1.005 - 1.030   pH 6.0 5.0 - 8.0   Glucose, UA NEGATIVE NEGATIVE mg/dL   Hgb urine dipstick NEGATIVE NEGATIVE   Bilirubin Urine NEGATIVE NEGATIVE   Ketones, ur NEGATIVE NEGATIVE mg/dL   Protein, ur NEGATIVE NEGATIVE mg/dL   Nitrite NEGATIVE NEGATIVE   Leukocytes, UA NEGATIVE NEGATIVE  Urine rapid drug screen (hosp performed)  Result Value Ref Range   Opiates NONE DETECTED NONE DETECTED   Cocaine NONE DETECTED NONE DETECTED   Benzodiazepines NONE DETECTED NONE DETECTED   Amphetamines NONE DETECTED NONE DETECTED   Tetrahydrocannabinol NONE DETECTED NONE DETECTED   Barbiturates NONE DETECTED NONE DETECTED     Imaging Review No results found. I have personally reviewed and evaluated these images and lab results as part of my medical decision-making.   EKG Interpretation None      MDM   Final diagnoses:  Chronic abdominal pain    The patient nontoxic no acute distress. Patient with a history of chronic abdominal pain. Patient with a healthy-appearing mucous fistula. Labs without any significant abnormalities other than a slight bump in his creatinine. Close follow-up will be important for that. Patient is followed at the [facility]. Patient stable for discharge home.    Vanetta MuldersScott Kemon Devincenzi, MD 09/17/15 [phone]

## 2015-11-28 ENCOUNTER — Emergency Department (HOSPITAL_COMMUNITY)
Admission: EM | Admit: 2015-11-28 | Discharge: 2015-11-29 | Disposition: A | Discharge status: Nursing Home (Includes ICF) | Payer: Medicare Other | Attending: Emergency Medicine | Admitting: Emergency Medicine

## 2015-11-28 ENCOUNTER — Encounter (HOSPITAL_COMMUNITY): Payer: Self-pay | Admitting: *Deleted

## 2015-11-28 ENCOUNTER — Emergency Department (HOSPITAL_COMMUNITY): Payer: Medicare Other

## 2015-11-28 DIAGNOSIS — Z79899 Other long term (current) drug therapy: Secondary | ICD-10-CM | POA: Insufficient documentation

## 2015-11-28 DIAGNOSIS — K5669 Other intestinal obstruction: Secondary | ICD-10-CM | POA: Insufficient documentation

## 2015-11-28 DIAGNOSIS — R109 Unspecified abdominal pain: Secondary | ICD-10-CM

## 2015-11-28 DIAGNOSIS — Z86718 Personal history of other venous thrombosis and embolism: Secondary | ICD-10-CM | POA: Diagnosis not present

## 2015-11-28 DIAGNOSIS — Z87442 Personal history of urinary calculi: Secondary | ICD-10-CM | POA: Diagnosis not present

## 2015-11-28 DIAGNOSIS — K56609 Unspecified intestinal obstruction, unspecified as to partial versus complete obstruction: Secondary | ICD-10-CM

## 2015-11-28 DIAGNOSIS — I10 Essential (primary) hypertension: Secondary | ICD-10-CM | POA: Diagnosis not present

## 2015-11-28 LAB — CBC WITH DIFFERENTIAL/PLATELET
BASOS ABS: 0 10*3/uL (ref 0.0–0.1)
BASOS PCT: 0 %
EOS ABS: 0.1 10*3/uL (ref 0.0–0.7)
EOS PCT: 2 %
HCT: 40.2 % (ref 39.0–52.0)
Hemoglobin: 13 g/dL (ref 13.0–17.0)
Lymphocytes Relative: 27 %
Lymphs Abs: 1.1 10*3/uL (ref 0.7–4.0)
MCH: 27.2 pg (ref 26.0–34.0)
MCHC: 32.3 g/dL (ref 30.0–36.0)
MCV: 84.1 fL (ref 78.0–100.0)
MONO ABS: 0.3 10*3/uL (ref 0.1–1.0)
MONOS PCT: 7 %
NEUTROS ABS: 2.7 10*3/uL (ref 1.7–7.7)
Neutrophils Relative %: 64 %
PLATELETS: 131 10*3/uL — AB (ref 150–400)
RBC: 4.78 MIL/uL (ref 4.22–5.81)
RDW: 14.6 % (ref 11.5–15.5)
WBC: 4.3 10*3/uL (ref 4.0–10.5)

## 2015-11-28 LAB — COMPREHENSIVE METABOLIC PANEL
ALBUMIN: 4.3 g/dL (ref 3.5–5.0)
ALT: 34 U/L (ref 17–63)
ANION GAP: 8 (ref 5–15)
AST: 30 U/L (ref 15–41)
Alkaline Phosphatase: 97 U/L (ref 38–126)
BILIRUBIN TOTAL: 0.8 mg/dL (ref 0.3–1.2)
BUN: 17 mg/dL (ref 6–20)
CHLORIDE: 104 mmol/L (ref 101–111)
CO2: 27 mmol/L (ref 22–32)
Calcium: 9.1 mg/dL (ref 8.9–10.3)
Creatinine, Ser: 1.81 mg/dL — ABNORMAL HIGH (ref 0.61–1.24)
GFR calc Af Amer: 50 mL/min — ABNORMAL LOW (ref >60)
GFR calc non Af Amer: 43 mL/min — ABNORMAL LOW (ref >60)
GLUCOSE: 126 mg/dL — AB (ref 65–99)
POTASSIUM: 4.1 mmol/L (ref 3.5–5.1)
SODIUM: 139 mmol/L (ref 135–145)
TOTAL PROTEIN: 7.9 g/dL (ref 6.5–8.1)

## 2015-11-28 LAB — LIPASE, BLOOD: Lipase: 23 U/L (ref 11–51)

## 2015-11-28 MED ORDER — HYDROMORPHONE HCL 1 MG/ML IJ SOLN
1.0000 mg | INTRAMUSCULAR | Status: AC | PRN
  Administered 2015-11-28 (×2): 1 mg via INTRAVENOUS
  Filled 2015-11-28 (×2): qty 1

## 2015-11-28 MED ORDER — DIATRIZOATE MEGLUMINE & SODIUM 66-10 % PO SOLN
ORAL | Status: AC
  Filled 2015-11-28: qty 30

## 2015-11-28 MED ORDER — SODIUM CHLORIDE 0.9 % IV SOLN
Freq: Once | INTRAVENOUS | Status: AC
  Administered 2015-11-28: 100 mL/h via INTRAVENOUS

## 2015-11-28 MED ORDER — ONDANSETRON HCL 4 MG/2ML IJ SOLN
4.0000 mg | Freq: Once | INTRAMUSCULAR | Status: AC
  Administered 2015-11-28: 4 mg via INTRAVENOUS
  Filled 2015-11-28: qty 2

## 2015-11-28 MED ORDER — HYDROMORPHONE HCL 1 MG/ML IJ SOLN
1.0000 mg | Freq: Once | INTRAMUSCULAR | Status: AC
Start: 2015-11-28 — End: 2015-11-28
  Administered 2015-11-28: 1 mg via INTRAVENOUS
  Filled 2015-11-28: qty 1

## 2015-11-28 NOTE — ED Notes (Addendum)
Generalized abdominal pain x a few day but has gotten much worse today per pt. Denies vomiting or diarrhea. Pt tearful in triage. LNBM was yesterday. Pt does state nausea.

## 2015-11-28 NOTE — ED Provider Notes (Signed)
CSN: [id number]     Arrival date & time 11/28/15  1640 History   First MD Initiated Contact with Patient 11/28/15 1709     Chief Complaint  Patient presents with  . Abdominal Pain     HPI  Patient presents for evaluation of abdominal pain. He states that he has pain every day. However, she states her last 2 days he's had creasing pain, abdominal distention, and nausea. Has not had vomiting as yet. He states any movement hurts his abdomen. He feels markedly distended and bloated.   Significant history for severe pancreatitis and pancreatic enteric fistula. Required debridement of hemorrhagic pancreas and temporary diverting ileostomy. 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  His general surgeon is Dr. Chestine Spore at [facility]. He still follows with him yearly.  Past Medical History  Diagnosis Date  . Hypertension   . Ileus (HCC) 01/03/2012  . Retroperitoneal bleed 01/04/2012  . Pancreatitis   . Renal disorder     kidney stones  . DVT (deep venous thrombosis) (HCC)     lower extremity   Past Surgical History  Procedure Laterality Date  . Hernia repair    . Pancreas surgery      half of pancreas removed  . Lithotripsy    . Colon resection    . Colostomy reversal    . Chronic pancreatitiis    . Pancreatitic fistula    . Sbo     No family history on file. Social History  Substance Use Topics  . Smoking status: Never Smoker   . Smokeless tobacco: None  . Alcohol Use: No     Comment: denies today 07/21/2015    Review of Systems  Constitutional: Negative for fever, chills, diaphoresis, appetite change and fatigue.  HENT: Negative for mouth sores, sore throat and trouble swallowing.   Eyes: Negative for visual disturbance.  Respiratory: Negative for cough, chest tightness, shortness of breath and wheezing.   Cardiovascular: Negative for chest pain.  Gastrointestinal: Positive for nausea,  abdominal pain and abdominal distention. Negative for vomiting and diarrhea.  Endocrine: Negative for polydipsia, polyphagia and polyuria.  Genitourinary: Negative for dysuria, frequency and hematuria.  Musculoskeletal: Negative for gait problem.  Skin: Negative for color change, pallor and rash.  Neurological: Negative for dizziness, syncope, light-headedness and headaches.  Hematological: Does not bruise/bleed easily.  Psychiatric/Behavioral: Negative for behavioral problems and confusion.      Allergies  Ace inhibitors; Coconut flavor; Contrast media; and Iodine  Home Medications   Prior to Admission medications   Medication Sig Start Date End Date Taking? Authorizing Provider  amitriptyline (ELAVIL) 150 MG tablet Take 150 mg by mouth at bedtime.  11/30/14  Yes Historical Provider, MD  CREON 36000 UNITS CPEP capsule Take 36,000-108,000 Units by mouth 3 (three) times daily with meals. 1 with snacks and 3 with meals 12/11/14  Yes Historical Provider, MD  folic acid (FOLVITE) 1 MG tablet Take 1 mg by mouth daily. 04/13/15  Yes Historical Provider, MD  hydrochlorothiazide (HYDRODIURIL) 25 MG tablet Take 25 mg by mouth daily.   Yes Historical Provider, MD  iron polysaccharides (NIFEREX) 150 MG capsule Take 150 mg by mouth 2 (two) times daily.   Yes Historical Provider, MD  magnesium oxide (MAG-OX) 400 (241.3 MG) MG tablet Take 1 tablet by mouth 2 (two) times daily. 01/13/15  Yes Historical Provider, MD  metoprolol (LOPRESSOR) 50 MG tablet Take 50 mg by mouth  2 (two) times daily. Take 1 tablet (50 mg total) by mouth 2 times daily. 08/08/12  Yes Historical Provider, MD  Oxycodone HCl 10 MG TABS Take 10-20 mg by mouth every 4 (four) hours as needed. For pain 01/21/15  Yes Historical Provider, MD  topiramate (TOPAMAX) 50 MG tablet Take 50-100 mg by mouth 2 (two) times daily. 1 tablet in the Am and 2 tablets at night   Yes Historical Provider, MD  vitamin B-12 (CYANOCOBALAMIN) 1000 MCG tablet Take by  mouth 2 (two) times a week.   Yes Historical Provider, MD  metoCLOPramide (REGLAN) 10 MG tablet Take 1 tablet (10 mg total) by mouth every 6 (six) hours as needed for nausea. Patient not taking: Reported on 11/28/2015 07/28/15   Dione Booze, MD  oxyCODONE-acetaminophen (PERCOCET) 5-325 MG tablet Take 1-2 tablets by mouth every 4 (four) hours as needed. Patient not taking: Reported on 09/06/2015 08/18/15   Donnetta Hutching, MD  promethazine (PHENERGAN) 25 MG tablet Take 1 tablet (25 mg total) by mouth every 6 (six) hours as needed. Patient not taking: Reported on 09/06/2015 08/18/15   Donnetta Hutching, MD   BP 111/82 mmHg  Pulse 82  Temp(Src) 99 F (37.2 C) (Oral)  Resp 18  Ht 5\' 10"  (1.778 m)  Wt 230 lb (104.327 kg)  BMI 33.00 kg/m2  SpO2 95% Physical Exam  Constitutional: He is oriented to person, place, and time. He appears well-developed and well-nourished. No distress.  Pleasant soft spoken large statured old male. Appears quite uncomfortable holding his abdomen.  HENT:  Head: Normocephalic.  Eyes: Conjunctivae are normal. Pupils are equal, round, and reactive to light. No scleral icterus.  Neck: Normal range of motion. Neck supple. No thyromegaly present.  Cardiovascular: Normal rate and regular rhythm.  Exam reveals no gallop and no friction rub.   No murmur heard. Pulmonary/Chest: Effort normal and breath sounds normal. No respiratory distress. He has no wheezes. He has no rales.  Abdominal: Soft. Bowel sounds are normal. He exhibits no distension. There is no tenderness. There is no rebound.  Hyperactive bowel sounds. Occasional high-pitched rushes. Distention of his abdomen. No frank peritoneal rotation or rigidity. Mucous fistula ostomy in the right lower quadrant with some turbid fluid. No stool.  Musculoskeletal: Normal range of motion.  Neurological: He is alert and oriented to person, place, and time.  Skin: Skin is warm and dry. No rash noted.  Psychiatric: He has a normal mood and  affect. His behavior is normal.    ED Course  Procedures (including critical care time) Labs Review Labs Reviewed  CBC WITH DIFFERENTIAL/PLATELET - Abnormal; Notable for the following:    Platelets 131 (*)    All other components within normal limits  COMPREHENSIVE METABOLIC PANEL - Abnormal; Notable for the following:    Glucose, Bld 126 (*)    Creatinine, Ser 1.81 (*)    GFR calc non Af Amer 43 (*)    GFR calc Af Amer 50 (*)    All other components within normal limits  LIPASE, BLOOD    Imaging Review Ct Abdomen Pelvis Wo Contrast  11/28/2015  CLINICAL DATA:  Generalized abdominal pain for few days, worsening pain today EXAM: CT ABDOMEN AND PELVIS WITHOUT CONTRAST TECHNIQUE: Multidetector CT imaging of the abdomen and pelvis was performed following the standard protocol without IV contrast. COMPARISON:  CT scan 08/18/2015 and 07/21/2015 FINDINGS: Oral contrast material was given to the patient. There is disc space flattening with vacuum disc phenomenon at L5-S1 level.  Mild gastric distension with oral contrast material. Mild distension of duodenum and proximal jejunum up to 6.3 cm. There are multiple distended small bowel loops with air-fluid levels in mid abdomen. No any contrast material is noted in small bowel at the level of right lower quadrant ostomy. There is gaseous distended small bowel loop in right lower quadrant at the level of ostomy bulging in ostomy bag measures about 3.3 cm. Findings are suspicious for at least partial high grade small bowel obstruction probable due to adhesions. Please note there is some contrast material within distal colon however the colon is decompressed. There is no evidence of free abdominal air. Study is limited without IV contrast. Mild congested mesenteric vessels are noted in mid and right lower mesentery. Prostate gland and seminal vesicles are unremarkable. The urinary bladder is unremarkable. IVC filter in place is noted. Unenhanced pancreas,  spleen and adrenal glands are unremarkable. Unenhanced kidneys are symmetrical in size. No hydronephrosis or hydroureter. Stable chronic mild saccular dilatation of colon in left lower quadrant please see coronal image 50. IMPRESSION: 1. There are multiple dilated small bowel loops in mid abdomen. Significant dilatation of the jejunum up to 6.3 cm. Multiple small bowel air-fluid levels are noted. No any contrast material is noted at the level of right lower quadrant ostomy. Findings are highly suspicious for small bowel obstruction probable due to adhesions. Clinical correlation is necessary. Mild congested mesenteric vessels are noted in mid and right lower quadrant mesentery. 2. IVC filter in place. 3. There is some contrast material in collapsed colon. It is unclear if is from prior studies or from partial small bowel obstruction. 4. There is gaseous distended bulging small bowel loop at the ostomy bag please see axial image 47. No contrast material is noted at this level. 5. No free abdominal air. 6. No hydronephrosis or hydroureter. These results were called by telephone at the time of interpretation on 11/28/2015 at 8:15 pm to Dr. Rolland Porter , who verbally acknowledged these results. Electronically Signed   By: Natasha Mead M.D.   On: 11/28/2015 20:16   I have personally reviewed and evaluated these images and lab results as part of my medical decision-making.   EKG Interpretation None      MDM   Final diagnoses:  SBO (small bowel obstruction) (HCC)    CT scan shows small bowel extraction mesenteric edema. No air within the wall of the outer suggest necrotic bowel. Is not acidotic. No leukocytosis. NG tube is been requested. I discussed the case with Dr.Chiba, at [facility]. She except the patient in transfer.    Rolland Porter, MD 11/28/15 2052

## 2015-11-29 DIAGNOSIS — K5669 Other intestinal obstruction: Secondary | ICD-10-CM | POA: Diagnosis not present

## 2016-01-06 ENCOUNTER — Emergency Department (HOSPITAL_COMMUNITY)
Admission: EM | Admit: 2016-01-06 | Discharge: 2016-01-06 | Disposition: A | Discharge status: Home / Self Care | Payer: Medicare Other | Attending: Emergency Medicine | Admitting: Emergency Medicine

## 2016-01-06 ENCOUNTER — Encounter (HOSPITAL_COMMUNITY): Payer: Self-pay

## 2016-01-06 DIAGNOSIS — I10 Essential (primary) hypertension: Secondary | ICD-10-CM | POA: Insufficient documentation

## 2016-01-06 DIAGNOSIS — K529 Noninfective gastroenteritis and colitis, unspecified: Secondary | ICD-10-CM | POA: Diagnosis not present

## 2016-01-06 DIAGNOSIS — Z79899 Other long term (current) drug therapy: Secondary | ICD-10-CM | POA: Insufficient documentation

## 2016-01-06 DIAGNOSIS — R1032 Left lower quadrant pain: Secondary | ICD-10-CM | POA: Diagnosis present

## 2016-01-06 HISTORY — DX: Unspecified intestinal obstruction, unspecified as to partial versus complete obstruction: K56.609

## 2016-01-06 LAB — CBC WITH DIFFERENTIAL/PLATELET
BASOS ABS: 0 10*3/uL (ref 0.0–0.1)
Basophils Relative: 0 %
Eosinophils Absolute: 0.1 10*3/uL (ref 0.0–0.7)
Eosinophils Relative: 2 %
HEMATOCRIT: 37.2 % — AB (ref 39.0–52.0)
Hemoglobin: 12.1 g/dL — ABNORMAL LOW (ref 13.0–17.0)
LYMPHS ABS: 1.8 10*3/uL (ref 0.7–4.0)
LYMPHS PCT: 40 %
MCH: 27.1 pg (ref 26.0–34.0)
MCHC: 32.5 g/dL (ref 30.0–36.0)
MCV: 83.4 fL (ref 78.0–100.0)
MONO ABS: 0.2 10*3/uL (ref 0.1–1.0)
Monocytes Relative: 5 %
NEUTROS ABS: 2.4 10*3/uL (ref 1.7–7.7)
Neutrophils Relative %: 53 %
Platelets: 152 10*3/uL (ref 150–400)
RBC: 4.46 MIL/uL (ref 4.22–5.81)
RDW: 15.6 % — AB (ref 11.5–15.5)
WBC: 4.5 10*3/uL (ref 4.0–10.5)

## 2016-01-06 LAB — COMPREHENSIVE METABOLIC PANEL
ALT: 24 U/L (ref 17–63)
AST: 23 U/L (ref 15–41)
Albumin: 4.2 g/dL (ref 3.5–5.0)
Alkaline Phosphatase: 87 U/L (ref 38–126)
Anion gap: 8 (ref 5–15)
BILIRUBIN TOTAL: 0.7 mg/dL (ref 0.3–1.2)
BUN: 20 mg/dL (ref 6–20)
CO2: 22 mmol/L (ref 22–32)
CREATININE: 1.58 mg/dL — AB (ref 0.61–1.24)
Calcium: 9 mg/dL (ref 8.9–10.3)
Chloride: 107 mmol/L (ref 101–111)
GFR calc Af Amer: 59 mL/min — ABNORMAL LOW (ref >60)
GFR, EST NON AFRICAN AMERICAN: 51 mL/min — AB (ref >60)
Glucose, Bld: 92 mg/dL (ref 65–99)
Potassium: 4.1 mmol/L (ref 3.5–5.1)
Sodium: 137 mmol/L (ref 135–145)
TOTAL PROTEIN: 7.8 g/dL (ref 6.5–8.1)

## 2016-01-06 LAB — LIPASE, BLOOD: LIPASE: 36 U/L (ref 11–51)

## 2016-01-06 MED ORDER — METRONIDAZOLE 500 MG PO TABS: 500.0000 mg | ORAL_TABLET | Freq: Two times a day (BID) | ORAL | Status: DC

## 2016-01-06 MED ORDER — HYDROMORPHONE HCL 1 MG/ML IJ SOLN
1.0000 mg | Freq: Once | INTRAMUSCULAR | Status: AC
Start: 2016-01-06 — End: 2016-01-06
  Administered 2016-01-06: 1 mg via INTRAVENOUS
  Filled 2016-01-06: qty 1

## 2016-01-06 MED ORDER — CIPROFLOXACIN HCL 500 MG PO TABS: 500.0000 mg | ORAL_TABLET | Freq: Two times a day (BID) | ORAL | Status: DC

## 2016-01-06 MED ORDER — ONDANSETRON HCL 4 MG/2ML IJ SOLN
4.0000 mg | Freq: Once | INTRAMUSCULAR | Status: AC
  Administered 2016-01-06: 4 mg via INTRAVENOUS
  Filled 2016-01-06: qty 2

## 2016-01-06 MED ORDER — SODIUM CHLORIDE 0.9 % IV BOLUS (SEPSIS)
1000.0000 mL | Freq: Once | INTRAVENOUS | Status: AC
  Administered 2016-01-06: 1000 mL via INTRAVENOUS

## 2016-01-06 MED ORDER — DICYCLOMINE HCL 20 MG PO TABS: ORAL_TABLET | ORAL | Status: DC

## 2016-01-06 NOTE — ED Notes (Signed)
PT c/o middle and left abdominal pain worsening x3 days with diarrhea but no vomiting or diarrhea.

## 2016-01-06 NOTE — Discharge Instructions (Signed)
Follow up with your md next week if not improved

## 2016-01-06 NOTE — ED Provider Notes (Signed)
CSN: [id number]     Arrival date & time 01/06/16  1223 History   First MD Initiated Contact with Patient 01/06/16 1619     Chief Complaint  Patient presents with  . Abdominal Pain     (Consider location/radiation/quality/duration/timing/severity/associated sxs/prior Treatment) Patient is a 45 y.o. male presenting with abdominal pain. The history is provided by the patient (Patient complains of abdominal cramping with some diarrhea and blood in his diarrhea).  Abdominal Pain Pain location:  LLQ Pain quality: aching   Pain radiates to:  Does not radiate Pain severity:  Moderate Onset quality:  Sudden Timing:  Intermittent Progression:  Waxing and waning Chronicity:  New Associated symptoms: no chest pain, no cough, no diarrhea, no fatigue and no hematuria     Past Medical History  Diagnosis Date  . Hypertension   . Ileus (HCC) 01/03/2012  . Retroperitoneal bleed 01/04/2012  . Pancreatitis   . Renal disorder     kidney stones  . DVT (deep venous thrombosis) (HCC)     lower extremity  . Bowel obstruction [facility](HCC)    Past Surgical History  Procedure Laterality Date  . Hernia repair    . Pancreas surgery      half of pancreas removed  . Lithotripsy    . Colon resection    . Colostomy reversal    . Chronic pancreatitiis    . Pancreatitic fistula    . Sbo    . Mucous fistula      2013   History reviewed. No pertinent family history. Social History  Substance Use Topics  . Smoking status: Never Smoker   . Smokeless tobacco: None  . Alcohol Use: No     Comment: denies today 07/21/2015    Review of Systems  Constitutional: Negative for appetite change and fatigue.  HENT: Negative for congestion, ear discharge and sinus pressure.   Eyes: Negative for discharge.  Respiratory: Negative for cough.   Cardiovascular: Negative for chest pain.  Gastrointestinal: Positive for abdominal pain. Negative for diarrhea.  Genitourinary: Negative for frequency and hematuria.   Musculoskeletal: Negative for back pain.  Skin: Negative for rash.  Neurological: Negative for seizures and headaches.  Psychiatric/Behavioral: Negative for hallucinations.      Allergies  Ace inhibitors; Coconut flavor; Contrast media; and Iodine  Home Medications   Prior to Admission medications   Medication Sig Start Date End Date Taking? Authorizing Provider  amitriptyline (ELAVIL) 150 MG tablet Take 150 mg by mouth at bedtime.  11/30/14  Yes Historical Provider, MD  CREON 36000 UNITS CPEP capsule Take 36,000-108,000 Units by mouth 3 (three) times daily with meals. 1 with snacks and 3 with meals 12/11/14  Yes Historical Provider, MD  folic acid (FOLVITE) 1 MG tablet Take 1 mg by mouth daily. 04/13/15  Yes Historical Provider, MD  hydrochlorothiazide (HYDRODIURIL) 25 MG tablet Take 25 mg by mouth daily.   Yes Historical Provider, MD  iron polysaccharides (NIFEREX) 150 MG capsule Take 150 mg by mouth 2 (two) times daily.   Yes Historical Provider, MD  magnesium oxide (MAG-OX) 400 (241.3 MG) MG tablet Take 1 tablet by mouth 2 (two) times daily. 01/13/15  Yes Historical Provider, MD  metoprolol (LOPRESSOR) 50 MG tablet Take 50 mg by mouth 2 (two) times daily. Take 1 tablet (50 mg total) by mouth 2 times daily. 08/08/12  Yes Historical Provider, MD  Oxycodone HCl 10 MG TABS Take 10-20 mg by mouth every 4 (four) hours as needed. For pain 01/21/15  Yes  Historical Provider, MD  topiramate (TOPAMAX) 50 MG tablet Take 50-100 mg by mouth 2 (two) times daily. 1 tablet in the Am and 2 tablets at night   Yes Historical Provider, MD  vitamin B-12 (CYANOCOBALAMIN) 1000 MCG tablet Take 1,000 mcg by mouth every Monday, Wednesday, and Friday.    Yes Historical Provider, MD  ciprofloxacin (CIPRO) 500 MG tablet Take 1 tablet (500 mg total) by mouth 2 (two) times daily. One po bid x 7 days 01/06/16   Bethann Berkshire, MD  metroNIDAZOLE (FLAGYL) 500 MG tablet Take 1 tablet (500 mg total) by mouth 2 (two) times daily.  One po bid x 7 days 01/06/16   Bethann Berkshire, MD   BP 109/86 mmHg  Pulse 79  Temp(Src) 98.3 F (36.8 C) (Oral)  Resp 20  Ht  (1.778 m)  Wt 232 lb (105.235 kg)  BMI 33.29 kg/m2  SpO2 97% Physical Exam  Constitutional: He is oriented to person, place, and time. He appears well-developed.  HENT:  Head: Normocephalic.  Eyes: Conjunctivae and EOM are normal. No scleral icterus.  Neck: Neck supple. No thyromegaly present.  Cardiovascular: Normal rate and regular rhythm.  Exam reveals no gallop and no friction rub.   No murmur heard. Pulmonary/Chest: No stridor. He has no wheezes. He has no rales. He exhibits no tenderness.  Abdominal: He exhibits no distension. There is tenderness. There is no rebound.  Moderate left lower quadrant tenderness. Patient has a pouch treatment was evident that he states is from his pancreas  Musculoskeletal: Normal range of motion. He exhibits no edema.  Lymphadenopathy:    He has no cervical adenopathy.  Neurological: He is oriented to person, place, and time. He exhibits normal muscle tone. Coordination normal.  Skin: No rash noted. No erythema.  Psychiatric: He has a normal mood and affect. His behavior is normal.    ED Course  Procedures (including critical care time) Labs Review Labs Reviewed  CBC WITH DIFFERENTIAL/PLATELET - Abnormal; Notable for the following:    Hemoglobin 12.1 (*)    HCT 37.2 (*)    RDW 15.6 (*)    All other components within normal limits  COMPREHENSIVE METABOLIC PANEL - Abnormal; Notable for the following:    Creatinine, Ser 1.58 (*)    GFR calc non Af Amer 51 (*)    GFR calc Af Amer 59 (*)    All other components within normal limits  LIPASE, BLOOD    Imaging Review No results found. I have personally reviewed and evaluated these images and lab results as part of my medical decision-making.   EKG Interpretation None      MDM   Final diagnoses:  Colitis    Lab work unremarkable. Patient has CT scan  done last month and numerous times before. CT last month looked fine. He has mild left lower quadrant discomfort. I will cover him with some Cipro and Flagyl for the abdominal pain and blood in his stool. Patient also given Bentyl. He will follow-up with his doctor next week  Bethann Berkshire, MD 01/06/16 1815

## 2016-01-20 ENCOUNTER — Emergency Department (HOSPITAL_COMMUNITY): Payer: Medicare Other

## 2016-01-20 ENCOUNTER — Encounter (HOSPITAL_COMMUNITY): Payer: Self-pay | Admitting: Emergency Medicine

## 2016-01-20 ENCOUNTER — Emergency Department (HOSPITAL_COMMUNITY)
Admission: EM | Admit: 2016-01-20 | Discharge: 2016-01-20 | Disposition: A | Discharge status: Home / Self Care | Payer: Medicare Other | Attending: Emergency Medicine | Admitting: Emergency Medicine

## 2016-01-20 DIAGNOSIS — I10 Essential (primary) hypertension: Secondary | ICD-10-CM | POA: Insufficient documentation

## 2016-01-20 DIAGNOSIS — Z79899 Other long term (current) drug therapy: Secondary | ICD-10-CM | POA: Insufficient documentation

## 2016-01-20 DIAGNOSIS — Y999 Unspecified external cause status: Secondary | ICD-10-CM | POA: Insufficient documentation

## 2016-01-20 DIAGNOSIS — S39012A Strain of muscle, fascia and tendon of lower back, initial encounter: Secondary | ICD-10-CM | POA: Diagnosis not present

## 2016-01-20 DIAGNOSIS — X58XXXA Exposure to other specified factors, initial encounter: Secondary | ICD-10-CM | POA: Insufficient documentation

## 2016-01-20 DIAGNOSIS — M545 Low back pain: Secondary | ICD-10-CM | POA: Diagnosis present

## 2016-01-20 DIAGNOSIS — Y929 Unspecified place or not applicable: Secondary | ICD-10-CM | POA: Insufficient documentation

## 2016-01-20 DIAGNOSIS — Y939 Activity, unspecified: Secondary | ICD-10-CM | POA: Diagnosis not present

## 2016-01-20 LAB — URINALYSIS, ROUTINE W REFLEX MICROSCOPIC
BILIRUBIN URINE: NEGATIVE
Glucose, UA: NEGATIVE mg/dL
HGB URINE DIPSTICK: NEGATIVE
KETONES UR: NEGATIVE mg/dL
Leukocytes, UA: NEGATIVE
Nitrite: NEGATIVE
PH: 6 (ref 5.0–8.0)
Protein, ur: NEGATIVE mg/dL

## 2016-01-20 MED ORDER — DIAZEPAM 5 MG PO TABS
10.0000 mg | ORAL_TABLET | Freq: Once | ORAL | Status: AC
  Administered 2016-01-20: 10 mg via ORAL
  Filled 2016-01-20: qty 2

## 2016-01-20 MED ORDER — ONDANSETRON HCL 4 MG PO TABS
4.0000 mg | ORAL_TABLET | Freq: Once | ORAL | Status: AC
  Administered 2016-01-20: 4 mg via ORAL
  Filled 2016-01-20: qty 1

## 2016-01-20 MED ORDER — HYDROCODONE-ACETAMINOPHEN 5-325 MG PO TABS: 1.0000 | ORAL_TABLET | ORAL | Status: DC | PRN

## 2016-01-20 MED ORDER — METHOCARBAMOL 500 MG PO TABS: 500.0000 mg | ORAL_TABLET | Freq: Three times a day (TID) | ORAL | Status: DC

## 2016-01-20 MED ORDER — HYDROCODONE-ACETAMINOPHEN 5-325 MG PO TABS
2.0000 | ORAL_TABLET | Freq: Once | ORAL | Status: AC
  Administered 2016-01-20: 2 via ORAL
  Filled 2016-01-20: qty 2

## 2016-01-20 NOTE — ED Notes (Signed)
Pt reports bilateral lower back pain that increases with movement. States it began when he stood up out of a chair yesterday. Pt ambulatory. Denies urinary problems or bowel incontinence.
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
## 2016-01-20 NOTE — ED Notes (Signed)
Pt reports lower back pain after standing up from a chair yesterday. Denies numbness.tingling, gi/gu sx. Denies trauma. Pt ambulatory. nad noted.

## 2016-01-20 NOTE — Discharge Instructions (Signed)
Your urine test is negative for kidney stone or urinary tract infection. The x-ray of your lumbar spine is negative for fracture, or dislocation. There is no obvious disc related problems seen on your plain x-ray film. I suspect that you have a muscle strain involving your lower back area. Please apply heating pad to the area. Please rest it as much as possible. Please use Norco for pain not improved by extra strength Tylenol. Please use Robaxin for spasm pain 3 times daily. Norco and Robaxin may cause drowsiness, please use these medicines with caution. Please see Dr. Mauri ReadingMullis for additional evaluation, and please make them aware of the medications that we have prescribed so that they may become a part of your medical record. Lumbosacral Strain Lumbosacral strain is a strain of any of the parts that make up your lumbosacral vertebrae. Your lumbosacral vertebrae are the bones that make up the lower third of your backbone. Your lumbosacral vertebrae are held together by muscles and tough, fibrous tissue (ligaments).  CAUSES  A sudden blow to your back can cause lumbosacral strain. Also, anything that causes an excessive stretch of the muscles in the low back can cause this strain. This is typically seen when people exert themselves strenuously, fall, lift heavy objects, bend, or crouch repeatedly. RISK FACTORS  Physically demanding work.  Participation in pushing or pulling sports or sports that require a sudden twist of the back (tennis, golf, baseball).  Weight lifting.  Excessive lower back curvature.  Forward-tilted pelvis.  Weak back or abdominal muscles or both.  Tight hamstrings. SIGNS AND SYMPTOMS  Lumbosacral strain may cause pain in the area of your injury or pain that moves (radiates) down your leg.  DIAGNOSIS Your health care provider can often diagnose lumbosacral strain through a physical exam. In some cases, you may need tests such as X-ray exams.  TREATMENT  Treatment for your  lower back injury depends on many factors that your clinician will have to evaluate. However, most treatment will include the use of anti-inflammatory medicines. HOME CARE INSTRUCTIONS   Avoid hard physical activities (tennis, racquetball, waterskiing) if you are not in proper physical condition for it. This may aggravate or create problems.  If you have a back problem, avoid sports requiring sudden body movements. Swimming and walking are generally safer activities.  Maintain good posture.  Maintain a healthy weight.  For acute conditions, you may put ice on the injured area.  Put ice in a plastic bag.  Place a towel between your skin and the bag.  Leave the ice on for 20 minutes, 2-3 times a day.  When the low back starts healing, stretching and strengthening exercises may be recommended. SEEK MEDICAL CARE IF:  Your back pain is getting worse.  You experience severe back pain not relieved with medicines. SEEK IMMEDIATE MEDICAL CARE IF:   You have numbness, tingling, weakness, or problems with the use of your arms or legs.  There is a change in bowel or bladder control.  You have increasing pain in any area of the body, including your belly (abdomen).  You notice shortness of breath, dizziness, or feel faint.  You feel sick to your stomach (nauseous), are throwing up (vomiting), or become sweaty.  You notice discoloration of your toes or legs, or your feet get very cold. MAKE SURE YOU:   Understand these instructions.  Will watch your condition.  Will get help right away if you are not doing well or get worse.   This information  is not intended to replace advice given to you by your health care provider. Make sure you discuss any questions you have with your health care provider.   Document Released: 07/04/2005 Document Revised: 10/15/2014 Document Reviewed: 05/13/2013 Elsevier Interactive Patient Education [employer].

## 2016-02-14 ENCOUNTER — Encounter (HOSPITAL_COMMUNITY): Payer: Self-pay | Admitting: Emergency Medicine

## 2016-02-14 ENCOUNTER — Emergency Department (HOSPITAL_COMMUNITY)
Admission: EM | Admit: 2016-02-14 | Discharge: 2016-02-15 | Disposition: A | Discharge status: Home / Self Care | Payer: Medicare Other | Attending: Emergency Medicine | Admitting: Emergency Medicine

## 2016-02-14 DIAGNOSIS — Z79899 Other long term (current) drug therapy: Secondary | ICD-10-CM | POA: Diagnosis not present

## 2016-02-14 DIAGNOSIS — K625 Hemorrhage of anus and rectum: Secondary | ICD-10-CM | POA: Insufficient documentation

## 2016-02-14 DIAGNOSIS — L988 Other specified disorders of the skin and subcutaneous tissue: Secondary | ICD-10-CM | POA: Insufficient documentation

## 2016-02-14 DIAGNOSIS — I10 Essential (primary) hypertension: Secondary | ICD-10-CM | POA: Diagnosis not present

## 2016-02-14 DIAGNOSIS — R110 Nausea: Secondary | ICD-10-CM | POA: Insufficient documentation

## 2016-02-14 DIAGNOSIS — R1084 Generalized abdominal pain: Secondary | ICD-10-CM | POA: Diagnosis present

## 2016-02-14 DIAGNOSIS — R109 Unspecified abdominal pain: Secondary | ICD-10-CM

## 2016-02-14 LAB — URINALYSIS, ROUTINE W REFLEX MICROSCOPIC
Bilirubin Urine: NEGATIVE
Glucose, UA: NEGATIVE mg/dL
Hgb urine dipstick: NEGATIVE
Ketones, ur: NEGATIVE mg/dL
Leukocytes, UA: NEGATIVE
Nitrite: NEGATIVE
Protein, ur: 30 mg/dL — AB
Specific Gravity, Urine: 1.025 (ref 1.005–1.030)
pH: 6 (ref 5.0–8.0)

## 2016-02-14 LAB — COMPREHENSIVE METABOLIC PANEL
ALT: 18 U/L (ref 17–63)
AST: 23 U/L (ref 15–41)
Albumin: 4.7 g/dL (ref 3.5–5.0)
Alkaline Phosphatase: 101 U/L (ref 38–126)
Anion gap: 10 (ref 5–15)
BUN: 20 mg/dL (ref 6–20)
CO2: 23 mmol/L (ref 22–32)
Calcium: 9.8 mg/dL (ref 8.9–10.3)
Chloride: 102 mmol/L (ref 101–111)
Creatinine, Ser: 1.88 mg/dL — ABNORMAL HIGH (ref 0.61–1.24)
GFR calc Af Amer: 47 mL/min — ABNORMAL LOW (ref >60)
GFR calc non Af Amer: 41 mL/min — ABNORMAL LOW (ref >60)
Glucose, Bld: 124 mg/dL — ABNORMAL HIGH (ref 65–99)
Potassium: 3.8 mmol/L (ref 3.5–5.1)
Sodium: 135 mmol/L (ref 135–145)
Total Bilirubin: 0.6 mg/dL (ref 0.3–1.2)
Total Protein: 9 g/dL — ABNORMAL HIGH (ref 6.5–8.1)

## 2016-02-14 LAB — URINE MICROSCOPIC-ADD ON: RBC / HPF: NONE SEEN RBC/hpf (ref 0–5)

## 2016-02-14 LAB — CBC WITH DIFFERENTIAL/PLATELET
Basophils Absolute: 0 10*3/uL (ref 0.0–0.1)
Basophils Relative: 0 %
Eosinophils Absolute: 0.1 10*3/uL (ref 0.0–0.7)
Eosinophils Relative: 3 %
HCT: 36.8 % — ABNORMAL LOW (ref 39.0–52.0)
Hemoglobin: 11.7 g/dL — ABNORMAL LOW (ref 13.0–17.0)
Lymphocytes Relative: 25 %
Lymphs Abs: 1.4 10*3/uL (ref 0.7–4.0)
MCH: 26.9 pg (ref 26.0–34.0)
MCHC: 31.8 g/dL (ref 30.0–36.0)
MCV: 84.6 fL (ref 78.0–100.0)
Monocytes Absolute: 0.3 10*3/uL (ref 0.1–1.0)
Monocytes Relative: 4 %
Neutro Abs: 3.9 10*3/uL (ref 1.7–7.7)
Neutrophils Relative %: 68 %
Platelets: 192 10*3/uL (ref 150–400)
RBC: 4.35 MIL/uL (ref 4.22–5.81)
RDW: 16.1 % — ABNORMAL HIGH (ref 11.5–15.5)
WBC: 5.7 10*3/uL (ref 4.0–10.5)

## 2016-02-14 LAB — LIPASE, BLOOD: Lipase: 21 U/L (ref 11–51)

## 2016-02-14 MED ORDER — HYDROMORPHONE HCL 1 MG/ML IJ SOLN
1.0000 mg | Freq: Once | INTRAMUSCULAR | Status: AC
  Administered 2016-02-14: 1 mg via INTRAVENOUS
  Filled 2016-02-14: qty 1

## 2016-02-14 MED ORDER — SODIUM CHLORIDE 0.9 % IV BOLUS (SEPSIS)
1000.0000 mL | Freq: Once | INTRAVENOUS | Status: AC
  Administered 2016-02-14: 1000 mL via INTRAVENOUS

## 2016-02-14 MED ORDER — ONDANSETRON HCL 4 MG/2ML IJ SOLN
4.0000 mg | Freq: Once | INTRAMUSCULAR | Status: AC
  Administered 2016-02-14: 4 mg via INTRAVENOUS
  Filled 2016-02-14: qty 2

## 2016-02-14 NOTE — ED Provider Notes (Signed)
CSN: [id number]     Arrival date & time 02/14/16  2017 History  By signing my name below, I, Peter [name], attest that this documentation has been prepared under the direction and in the presence of Peter RazorStephen Leylany Nored, MD.   Electronically Signed: Iona Beardhristian [name], ED Scribe. 02/14/2016. 9:02 PM  Chief Complaint  Patient presents with  . Abdominal Pain    The history is provided by the patient. No language interpreter was used.   HPI Comments: Peter [name] is a 45 y.o. male with PMHx HTN, ileus, pancreatitis, DVT, and kidney stones and PSHx of pancreas surgery who presents to the Emergency Department complaining of gradual onset, cramping, diffuse abdominal pain, ongoing for two days. Pt states he has been on Xarelto for about three weeks. Pt reports associated bloody stool and bloody drainage in his pancreatic ostomy site. He also reports nausea, dizziness, and light-headedness. No other associated symptoms noted. Abdominal pain is worse with movement. Pt took oxycodone at home with no relief to symptoms. No other worsening or alleviating factors noted. Pt denies any other pertinent symptoms.   Past Medical History  Diagnosis Date  . Hypertension   . Ileus (HCC) 01/03/2012  . Retroperitoneal bleed 01/04/2012  . Pancreatitis   . Renal disorder     kidney stones  . DVT (deep venous thrombosis) (HCC)     lower extremity  . Bowel obstruction [facility](HCC)    Past Surgical History  Procedure Laterality Date  . Hernia repair    . Pancreas surgery      half of pancreas removed  . Lithotripsy    . Colon resection    . Colostomy reversal    . Chronic pancreatitiis    . Pancreatitic fistula    . Sbo    . Mucous fistula      2013   History reviewed. No pertinent family history. Social History  Substance Use Topics  . Smoking status: Never Smoker   . Smokeless tobacco: None  . Alcohol Use: No     Comment: denies today 07/21/2015    Review of Systems  Gastrointestinal: Positive for  nausea, abdominal pain and blood in stool.  Neurological: Positive for dizziness and light-headedness.  All other systems reviewed and are negative.    Allergies  Ace inhibitors; Coconut flavor; Contrast media; and Iodine  Home Medications   Prior to Admission medications   Medication Sig Start Date End Date Taking? Authorizing Provider  amitriptyline (ELAVIL) 150 MG tablet Take 150 mg by mouth at bedtime.  11/30/14   Historical Provider, MD  ciprofloxacin (CIPRO) 500 MG tablet Take 1 tablet (500 mg total) by mouth 2 (two) times daily. One po bid x 7 days Patient not taking: Reported on 01/20/2016 01/06/16   [name], MD  CREON 36000 UNITS CPEP capsule Take 36,000-108,000 Units by mouth 3 (three) times daily with meals. 1 with snacks and 3 with meals 12/11/14   Historical Provider, MD  dicyclomine (BENTYL) 20 MG tablet Take one every 8 hours as needed for abdominal cramps Patient taking differently: Take 20 mg by mouth every 8 (eight) hours as needed for spasms.  01/06/16   [name], MD  folic acid (FOLVITE) 1 MG tablet Take 1 mg by mouth daily. 04/13/15   Historical Provider, MD  hydrochlorothiazide (HYDRODIURIL) 25 MG tablet Take 25 mg by mouth daily.    Historical Provider, MD  HYDROcodone-acetaminophen (NORCO/VICODIN) 5-325 MG tablet Take 1 tablet by mouth every 4 (four) hours as needed. 01/20/16  [name], PA-C  iron polysaccharides (NIFEREX) 150 MG capsule Take 150 mg by mouth 2 (two) times daily.    Historical Provider, MD  magnesium oxide (MAG-OX) 400 (241.3 MG) MG tablet Take 1 tablet by mouth 2 (two) times daily. 01/13/15   Historical Provider, MD  methocarbamol (ROBAXIN) 500 MG tablet Take 1 tablet (500 mg total) by mouth 3 (three) times daily. 01/20/16   [name], PA-C  metoprolol (LOPRESSOR) 50 MG tablet Take 50 mg by mouth 2 (two) times daily. Take 1 tablet (50 mg total) by mouth 2 times daily. 08/08/12   Historical Provider, MD  metroNIDAZOLE (FLAGYL) 500 MG tablet  Take 1 tablet (500 mg total) by mouth 2 (two) times daily. One po bid x 7 days Patient not taking: Reported on 01/20/2016 01/06/16   Bethann Berkshire, MD  Oxycodone HCl 10 MG TABS Take 10-20 mg by mouth every 4 (four) hours as needed. For pain 01/21/15   Historical Provider, MD  topiramate (TOPAMAX) 50 MG tablet Take 50-100 mg by mouth 2 (two) times daily. 1 tablet in the Am and 2 tablets at night    Historical Provider, MD  vitamin B-12 (CYANOCOBALAMIN) 1000 MCG tablet Take 1,000 mcg by mouth every Monday, Wednesday, and Friday.     Historical Provider, MD   Pulse 97  SpO2 99% Physical Exam  Constitutional: He is oriented to person, place, and time. He appears well-developed and well-nourished.  HENT:  Head: Normocephalic and atraumatic.  Eyes: EOM are normal.  Neck: Normal range of motion.  Cardiovascular: Normal rate, regular rhythm, normal heart sounds and intact distal pulses.   Pulmonary/Chest: Effort normal and breath sounds normal. No respiratory distress.  Abdominal: Soft. He exhibits distension. There is tenderness. There is no rebound and no guarding.  Mucous fistula with healthy appearing stoma. Diffuse abdominal TTP.   Musculoskeletal: Normal range of motion.  Neurological: He is alert and oriented to person, place, and time.  Skin: Skin is warm and dry.  Psychiatric: He has a normal mood and affect. Judgment normal.  Nursing note and vitals reviewed.   ED Course  Procedures (including critical care time) DIAGNOSTIC STUDIES: Oxygen Saturation is 99% on RA, normal by my interpretation.    COORDINATION OF CARE: 9:02 PM Discussed treatment plan which includes CBC with differential and CMP with pt at bedside and pt agreed to plan.  Labs Review Labs Reviewed  CBC WITH DIFFERENTIAL/PLATELET - Abnormal; Notable for the following:    Hemoglobin 11.7 (*)    HCT 36.8 (*)    RDW 16.1 (*)    All other components within normal limits  COMPREHENSIVE METABOLIC PANEL - Abnormal;  Notable for the following:    Glucose, Bld 124 (*)    Creatinine, Ser 1.88 (*)    Total Protein 9.0 (*)    GFR calc non Af Amer 41 (*)    GFR calc Af Amer 47 (*)    All other components within normal limits  URINALYSIS, ROUTINE W REFLEX MICROSCOPIC (NOT AT [facility]) - Abnormal; Notable for the following:    Protein, ur 30 (*)    All other components within normal limits  URINE MICROSCOPIC-ADD ON - Abnormal; Notable for the following:    Squamous Epithelial / LPF 0-5 (*)    Bacteria, UA FEW (*)    Casts HYALINE CASTS (*)    All other components within normal limits  LIPASE, BLOOD    Imaging Review No results found. I have personally reviewed and evaluated these  lab results as part of my medical decision-making.   EKG Interpretation None      MDM   Final diagnoses:  Abdominal pain, unspecified abdominal location  Rectal bleeding    45yM with abdominal pain. Suspect more chronic pain. No vomiting. HD stable. Renal function close to baseline. Reports rectal bleeding but no gross blood on exam and H/H stable as well. No significant change since recent ER visit at [facility] for the same complaint. I personally preformed the services scribed in my presence. The recorded information has been reviewed is accurate. Peter Razor, MD.     Peter Razor, MD 02/16/16 [phone]

## 2016-02-14 NOTE — ED Notes (Signed)
Pt c/o severe abd pain on the right side. Pt c/o bloody stool in colostomy. Pt started taking xarelto yesterday for blood clot in leg.

## 2016-02-14 NOTE — ED Notes (Signed)
Pt states that he has been on Xarelto one pill starting yesterday but that up until yesterday, he was taking two a day. Additionally, pt states blood in stool and some bloody drainage out of his pancreatic ostomy site. States ostomy stoma is much bigger than usual.

## 2016-02-15 DIAGNOSIS — K625 Hemorrhage of anus and rectum: Secondary | ICD-10-CM | POA: Diagnosis not present

## 2016-02-15 NOTE — Discharge Instructions (Signed)
Abdominal Pain, Adult °Many things can cause abdominal pain. Usually, abdominal pain is not caused by a disease and will improve without treatment. It can often be observed and treated at home. Your health care provider will do a physical exam and possibly order blood tests and X-rays to help determine the seriousness of your pain. However, in many cases, more time must pass before a clear cause of the pain can be found. Before that point, your health care provider may not know if you need more testing or further treatment. °HOME CARE INSTRUCTIONS °Monitor your abdominal pain for any changes. The following actions may help to alleviate any discomfort you are experiencing: °· Only take over-the-counter or prescription medicines as directed by your health care provider. °· Do not take laxatives unless directed to do so by your health care provider. °· Try a clear liquid diet (broth, tea, or water) as directed by your health care provider. Slowly move to a bland diet as tolerated. °SEEK MEDICAL CARE IF: °· You have unexplained abdominal pain. °· You have abdominal pain associated with nausea or diarrhea. °· You have pain when you urinate or have a bowel movement. °· You experience abdominal pain that wakes you in the night. °· You have abdominal pain that is worsened or improved by eating food. °· You have abdominal pain that is worsened with eating fatty foods. °· You have a fever. °SEEK IMMEDIATE MEDICAL CARE IF: °· Your pain does not go away within 2 hours. °· You keep throwing up (vomiting). °· Your pain is felt only in portions of the abdomen, such as the right side or the left lower portion of the abdomen. °· You pass bloody or black tarry stools. °MAKE SURE YOU: °· Understand these instructions. °· Will watch your condition. °· Will get help right away if you are not doing well or get worse. °  °This information is not intended to replace advice given to you by your health care provider. Make sure you discuss  any questions you have with your health care provider. °  °Document Released: 07/04/2005 Document Revised: 06/15/2015 Document Reviewed: 06/03/2013 °Elsevier Interactive Patient Education ©2016 [employer] ° °Gastrointestinal Bleeding °Gastrointestinal (GI) bleeding means there is bleeding somewhere along the digestive tract, between the mouth and anus. °CAUSES  °There are many different problems that can cause GI bleeding. Possible causes include: °· Esophagitis. This is inflammation, irritation, or swelling of the esophagus. °· Hemorrhoids. These are veins that are full of blood (engorged) in the rectum. They cause pain, inflammation, and may bleed. °· Anal fissures. These are areas of painful tearing which may bleed. They are often caused by passing hard stool. °· Diverticulosis. These are pouches that form on the colon over time, with age, and may bleed significantly. °· Diverticulitis. This is inflammation in areas with diverticulosis. It can cause pain, fever, and bloody stools, although bleeding is rare. °· Polyps and cancer. Colon cancer often starts out as precancerous polyps. °· Gastritis and ulcers. Bleeding from the upper gastrointestinal tract (near the stomach) may travel through the intestines and produce black, sometimes tarry, often bad smelling stools. In certain cases, if the bleeding is fast enough, the stools may not be black, but red. This condition may be life-threatening. °SYMPTOMS  °· Vomiting bright red blood or material that looks like coffee grounds. °· Bloody, black, or tarry stools. °DIAGNOSIS  °Your caregiver may diagnose your condition by taking your history and performing a physical exam. More tests may be needed, including: °· X-rays and other   imaging tests. °· Esophagogastroduodenoscopy (EGD). This test uses a flexible, lighted tube to look at your esophagus, stomach, and small intestine. °· Colonoscopy. This test uses a flexible, lighted tube to look at your colon. °TREATMENT    °Treatment depends on the cause of your bleeding.  °· For bleeding from the esophagus, stomach, small intestine, or colon, the caregiver doing your EGD or colonoscopy may be able to stop the bleeding as part of the procedure. °· Inflammation or infection of the colon can be treated with medicines. °· Many rectal problems can be treated with creams, suppositories, or warm baths. °· Surgery is sometimes needed. °· Blood transfusions are sometimes needed if you have lost a lot of blood. °If bleeding is slow, you may be allowed to go home. If there is a lot of bleeding, you will need to stay in the hospital for observation. °HOME CARE INSTRUCTIONS  °· Take any medicines exactly as prescribed. °· Keep your stools soft by eating foods that are high in fiber. These foods include whole grains, legumes, fruits, and vegetables. Prunes (1 to 3 a day) work well for many people. °· Drink enough fluids to keep your urine clear or pale yellow. °SEEK IMMEDIATE MEDICAL CARE IF:  °· Your bleeding increases. °· You feel lightheaded, weak, or you faint. °· You have severe cramps in your back or abdomen. °· You pass large blood clots in your stool. °· Your problems are getting worse. °MAKE SURE YOU:  °· Understand these instructions. °· Will watch your condition. °· Will get help right away if you are not doing well or get worse. °  °This information is not intended to replace advice given to you by your health care provider. Make sure you discuss any questions you have with your health care provider. °  °Document Released: 09/21/2000 Document Revised: 09/10/2012 Document Reviewed: 03/14/2015 °Elsevier Interactive Patient Education ©2016 Elsevier Inc. ° °

## 2016-04-24 ENCOUNTER — Emergency Department (HOSPITAL_COMMUNITY): Payer: Medicare Other

## 2016-04-24 ENCOUNTER — Encounter (HOSPITAL_COMMUNITY): Payer: Self-pay

## 2016-04-24 ENCOUNTER — Emergency Department (HOSPITAL_COMMUNITY)
Admission: EM | Admit: 2016-04-24 | Discharge: 2016-04-24 | Disposition: A | Discharge status: Home / Self Care | Payer: Medicare Other | Attending: Emergency Medicine | Admitting: Emergency Medicine

## 2016-04-24 DIAGNOSIS — Y999 Unspecified external cause status: Secondary | ICD-10-CM | POA: Insufficient documentation

## 2016-04-24 DIAGNOSIS — Z79899 Other long term (current) drug therapy: Secondary | ICD-10-CM | POA: Insufficient documentation

## 2016-04-24 DIAGNOSIS — Y9301 Activity, walking, marching and hiking: Secondary | ICD-10-CM | POA: Insufficient documentation

## 2016-04-24 DIAGNOSIS — I10 Essential (primary) hypertension: Secondary | ICD-10-CM | POA: Diagnosis not present

## 2016-04-24 DIAGNOSIS — S9032XA Contusion of left foot, initial encounter: Secondary | ICD-10-CM | POA: Insufficient documentation

## 2016-04-24 DIAGNOSIS — Y929 Unspecified place or not applicable: Secondary | ICD-10-CM | POA: Insufficient documentation

## 2016-04-24 DIAGNOSIS — W228XXA Striking against or struck by other objects, initial encounter: Secondary | ICD-10-CM | POA: Insufficient documentation

## 2016-04-24 DIAGNOSIS — S99922A Unspecified injury of left foot, initial encounter: Secondary | ICD-10-CM | POA: Diagnosis present

## 2016-04-24 DIAGNOSIS — M79675 Pain in left toe(s): Secondary | ICD-10-CM

## 2016-04-24 MED ORDER — HYDROCODONE-ACETAMINOPHEN 5-325 MG PO TABS: 1.0000 | ORAL_TABLET | Freq: Four times a day (QID) | ORAL | Status: DC | PRN

## 2016-04-24 MED ORDER — OXYCODONE-ACETAMINOPHEN 5-325 MG PO TABS
2.0000 | ORAL_TABLET | Freq: Once | ORAL | Status: AC
  Administered 2016-04-24: 2 via ORAL
  Filled 2016-04-24: qty 2

## 2016-04-24 NOTE — ED Provider Notes (Signed)
CSN: [id number]     Arrival date & time 04/24/16  0731 History   First MD Initiated Contact with Patient 04/24/16 [phone]     Chief Complaint  Patient presents with  . Toe Pain     (Consider location/radiation/quality/duration/timing/severity/associated sxs/prior Treatment) Patient is a 45 y.o. male presenting with toe pain. The history is provided by the patient and medical records.  Toe Pain This is a new problem. The current episode started 2 days ago. The problem occurs constantly. The problem has been gradually worsening. Pertinent negatives include no chest pain, no abdominal pain, no headaches and no shortness of breath. The symptoms are aggravated by walking, bending and stress. Nothing relieves the symptoms. He has tried nothing for the symptoms. The treatment provided no relief.    45 year old male who was walking in his house 2 nights ago when he directly stubbed his left second, third, and fourth toes on the corner of all. He reports immediate onset of moderate pain that is become increasingly severe over the last 2 days. He is now having difficulty weightbearing due to pain. He is not taking any medications for this. Denies any fall or other trauma. Denies any distal numbness or weakness. No open wounds. Of note, he does have a history of Xarelto use due to family history of DVTs as well as personal history. Denies any leg swelling and he has been compliant with his regimen.  Past Medical History  Diagnosis Date  . Hypertension   . Ileus (HCC) 01/03/2012  . Retroperitoneal bleed 01/04/2012  . Pancreatitis   . Renal disorder     kidney stones  . DVT (deep venous thrombosis) (HCC)     lower extremity  . Bowel obstruction [facility])    Past Surgical History  Procedure Laterality Date  . Hernia repair    . Pancreas surgery      half of pancreas removed  . Lithotripsy    . Colon resection    . Colostomy reversal    . Chronic pancreatitiis    . Pancreatitic fistula    . Sbo    .  Mucous fistula      2013   No family history on file. Social History  Substance Use Topics  . Smoking status: Never Smoker   . Smokeless tobacco: None  . Alcohol Use: No     Comment: denies today 07/21/2015    Review of Systems  Constitutional: Negative for fever, chills and fatigue.  HENT: Negative for congestion and rhinorrhea.   Eyes: Negative for visual disturbance.  Respiratory: Negative for cough, shortness of breath and wheezing.   Cardiovascular: Negative for chest pain.  Gastrointestinal: Negative for abdominal pain.  Genitourinary: Negative for flank pain.  Musculoskeletal: Positive for gait problem. Negative for neck pain and neck stiffness.  Skin: Negative for rash.  Allergic/Immunologic: Negative for immunocompromised state.  Neurological: Negative for syncope and headaches.      Allergies  Ace inhibitors; Coconut flavor; Contrast media; and Iodine  Home Medications   Prior to Admission medications   Medication Sig Start Date End Date Taking? Authorizing Provider  amitriptyline (ELAVIL) 150 MG tablet Take 150 mg by mouth at bedtime.  11/30/14   Historical Provider, MD  CREON 36000 UNITS CPEP capsule Take 36,000-108,000 Units by mouth 3 (three) times daily with meals. 1 with snacks and 3 with meals 12/11/14   Historical Provider, MD  dicyclomine (BENTYL) 20 MG tablet Take one every 8 hours as needed for abdominal cramps Patient not  taking: Reported on 02/14/2016 01/06/16   Bethann Berkshire, MD  folic acid (FOLVITE) 1 MG tablet Take 1 mg by mouth daily. 04/13/15   Historical Provider, MD  hydrochlorothiazide (HYDRODIURIL) 25 MG tablet Take 25 mg by mouth daily.    Historical Provider, MD  HYDROcodone-acetaminophen (NORCO/VICODIN) 5-325 MG tablet Take 1 tablet by mouth every 6 (six) hours as needed for severe pain. 04/24/16   Shaune Pollack, MD  iron polysaccharides (NIFEREX) 150 MG capsule Take 150 mg by mouth 2 (two) times daily.    Historical Provider, MD  magnesium oxide  (MAG-OX) 400 (241.3 MG) MG tablet Take 1 tablet by mouth 2 (two) times daily. 01/13/15   Historical Provider, MD  methocarbamol (ROBAXIN) 500 MG tablet Take 1 tablet (500 mg total) by mouth 3 (three) times daily. Patient not taking: Reported on 02/14/2016 01/20/16   [name], PA-C  metoprolol (LOPRESSOR) 50 MG tablet Take 50 mg by mouth 2 (two) times daily. Take 1 tablet (50 mg total) by mouth 2 times daily. 08/08/12   Historical Provider, MD  Oxycodone HCl 10 MG TABS Take 10-20 mg by mouth every 4 (four) hours as needed. For pain 01/21/15   Historical Provider, MD  rivaroxaban (XARELTO) 20 MG TABS tablet Take 20 mg by mouth every morning.    Historical Provider, MD  topiramate (TOPAMAX) 50 MG tablet Take 50-100 mg by mouth 2 (two) times daily. 1 tablet in the Am and 2 tablets at night    Historical Provider, MD  vitamin B-12 (CYANOCOBALAMIN) 1000 MCG tablet Take 1,000 mcg by mouth every Monday, Wednesday, and Friday.     Historical Provider, MD   BP 108/89 mmHg  Pulse 77  Temp(Src) 98.5 F (36.9 C) (Oral)  Resp 18  Ht 5\' 10"  (1.778 m)  Wt 240 lb (108.863 kg)  BMI 34.44 kg/m2  SpO2 100% Physical Exam  Constitutional: He appears well-developed and well-nourished. No distress.  HENT:  Head: Normocephalic and atraumatic.  Eyes: Conjunctivae are normal.  Neck: Neck supple.  Cardiovascular: Normal rate and regular rhythm.   Pulmonary/Chest: Effort normal. No respiratory distress.  Abdominal: He exhibits no distension.  Musculoskeletal: He exhibits tenderness. He exhibits no edema.  Neurological: He is alert.  Skin: Skin is warm.  Nursing note and vitals reviewed.   ED Course  Procedures (including critical care time) Labs Review Labs Reviewed - No data to display  Imaging Review Dg Foot Complete Left  04/24/2016  CLINICAL DATA:  Pain over second- fourth toes after blunt trauma to door 2 days ago. EXAM: LEFT FOOT - COMPLETE 3+ VIEW COMPARISON:  None. FINDINGS: There is no evidence of  fracture or dislocation. There is no evidence of arthropathy or other focal bone abnormality. Soft tissues are unremarkable. IMPRESSION: Negative. Electronically Signed   By: Elberta Fortis M.D.   On: 04/24/2016 08:31   I have personally reviewed and evaluated these images and lab results as part of my medical decision-making.   EKG Interpretation None      MDM  45 yo M with PMHx of HTN, h/o DVT on Xarelto who presents with left foot pain after stubbing it against wall 2 days ago. No open wounds. No other injuries. Pt does have h/o DVT but has no swelling, calf TTP, and has been adherent to his Xarelto regimen - do not suspect DVT. No bruising under foot, mechanism not c/w Lisfranc or ligamentous injury. Exam as above. No significant swelling. He does have mild contusion but compartments soft, no  signs of compartment syndrome. Plain films show no acute fracture.  Will treat with post-op shoe and crutches PRN, short course of analgesics, and advise outpatient follow-up. I reiterated [facility] as [facility], ranging foot/ankle, and movement in order to prevent DVTs in setting of history of same. Return precautions given. Pt in agreement.  Clinical Impression: 1. Toe pain, left   2. Foot contusion, left, initial encounter     Disposition: Discharge  Condition: Good  I have discussed the results, Dx and Tx plan with the pt(& family if present). He/she/they expressed understanding and agree(s) with the plan. Discharge instructions discussed at great length. Strict return precautions discussed and pt &/or family have verbalized understanding of the instructions. No further questions at time of discharge.    New Prescriptions   No medications on file    Follow Up: Dorthula RueStephen Trey Mullis, MD [address] [place] [id number] [phone]   Follow-up in 5-7 days if symptoms persist or worsen.    Shaune Pollackameron Chelcie Estorga, MD 04/24/16 [phone]

## 2016-04-24 NOTE — ED Notes (Signed)
Pt reports hit his left foot on the wall 2 days ago.  C/O pain to left 2nd, 3rd, and 4th toes.  No obvious deformity noted.  Pt ambulatory with a limp.

## 2016-04-24 NOTE — Discharge Instructions (Signed)
You were seen today for pain in your left foot. X-rays showed no fracture. Your pain is likely due to bruising and possibly a small contusion. Please use the hard-sole shoe and crutches as needed for comfort for the next 5-7 days. It is important to continue to move your leg and foot, however, and resume walking as soon as you are able to do so without severe pain. Continue taking your blood thinners and all other medications. Foot Contusion A foot contusion is a deep bruise to the foot. Contusions are the result of an injury that caused bleeding under the skin. The contusion may turn blue, purple, or yellow. Minor injuries will give you a painless contusion, but more severe contusions may stay painful and swollen for a few weeks. CAUSES  A foot contusion comes from a direct blow to that area, such as a heavy object falling on the foot. SYMPTOMS   Swelling of the foot.  Discoloration of the foot.  Tenderness or soreness of the foot. DIAGNOSIS  You will have a physical exam and will be asked about your history. You may need an X-ray of your foot to look for a broken bone (fracture).  TREATMENT  An elastic wrap may be recommended to support your foot. Resting, elevating, and applying cold compresses to your foot are often the best treatments for a foot contusion. Over-the-counter medicines may also be recommended for pain control. HOME CARE INSTRUCTIONS   Put ice on the injured area.  Put ice in a plastic bag.  Place a towel between your skin and the bag.  Leave the ice on for 15-20 minutes, 03-04 times a day.  Only take over-the-counter or prescription medicines for pain, discomfort, or fever as directed by your caregiver.  If told, use an elastic wrap as directed. This can help reduce swelling. You may remove the wrap for sleeping, showering, and bathing. If your toes become numb, cold, or blue, take the wrap off and reapply it more loosely.  Elevate your foot with pillows to reduce  swelling.  Try to avoid standing or walking while the foot is painful. Do not resume use until instructed by your caregiver. Then, begin use gradually. If pain develops, decrease use. Gradually increase activities that do not cause discomfort until you have normal use of your foot.  See your caregiver as directed. It is very important to keep all follow-up appointments in order to avoid any lasting problems with your foot, including long-term (chronic) pain. SEEK IMMEDIATE MEDICAL CARE IF:   You have increased redness, swelling, or pain in your foot.  Your swelling or pain is not relieved with medicines.  You have loss of feeling in your foot or are unable to move your toes.  Your foot turns cold or blue.  You have pain when you move your toes.  Your foot becomes warm to the touch.  Your contusion does not improve in 2 days. MAKE SURE YOU:   Understand these instructions.  Will watch your condition.  Will get help right away if you are not doing well or get worse.   This information is not intended to replace advice given to you by your health care provider. Make sure you discuss any questions you have with your health care provider.   Document Released: 07/16/2006 Document Revised: 03/25/2012 Document Reviewed: 05/31/2015 Elsevier Interactive Patient Education [employer]2016 [employer] Inc[employer]

## 2016-04-24 NOTE — ED Notes (Signed)
Patient with no complaints at this time. Respirations even and unlabored. Skin warm/dry. Discharge instructions reviewed with patient at this time. Patient given opportunity to voice concerns/ask questions. Patient discharged at this time and left Emergency Department with steady gait.   

## 2016-07-16 ENCOUNTER — Encounter (HOSPITAL_COMMUNITY): Payer: Self-pay

## 2016-07-16 ENCOUNTER — Emergency Department (HOSPITAL_COMMUNITY)
Admission: EM | Admit: 2016-07-16 | Discharge: 2016-07-16 | Disposition: A | Discharge status: Home / Self Care | Payer: Medicare Other | Attending: Emergency Medicine | Admitting: Emergency Medicine

## 2016-07-16 DIAGNOSIS — Z79899 Other long term (current) drug therapy: Secondary | ICD-10-CM | POA: Diagnosis not present

## 2016-07-16 DIAGNOSIS — K625 Hemorrhage of anus and rectum: Secondary | ICD-10-CM | POA: Insufficient documentation

## 2016-07-16 DIAGNOSIS — R110 Nausea: Secondary | ICD-10-CM | POA: Insufficient documentation

## 2016-07-16 DIAGNOSIS — I10 Essential (primary) hypertension: Secondary | ICD-10-CM | POA: Insufficient documentation

## 2016-07-16 DIAGNOSIS — R42 Dizziness and giddiness: Secondary | ICD-10-CM | POA: Diagnosis not present

## 2016-07-16 DIAGNOSIS — R109 Unspecified abdominal pain: Secondary | ICD-10-CM | POA: Diagnosis present

## 2016-07-16 LAB — TYPE AND SCREEN
ABO/RH(D): A POS
ANTIBODY SCREEN: NEGATIVE

## 2016-07-16 LAB — COMPREHENSIVE METABOLIC PANEL
ALT: 23 U/L (ref 17–63)
AST: 29 U/L (ref 15–41)
Albumin: 4.3 g/dL (ref 3.5–5.0)
Alkaline Phosphatase: 77 U/L (ref 38–126)
Anion gap: 6 (ref 5–15)
BILIRUBIN TOTAL: 0.8 mg/dL (ref 0.3–1.2)
BUN: 16 mg/dL (ref 6–20)
CALCIUM: 9.3 mg/dL (ref 8.9–10.3)
CO2: 25 mmol/L (ref 22–32)
CREATININE: 1.69 mg/dL — AB (ref 0.61–1.24)
Chloride: 105 mmol/L (ref 101–111)
GFR, EST AFRICAN AMERICAN: 54 mL/min — AB (ref >60)
GFR, EST NON AFRICAN AMERICAN: 47 mL/min — AB (ref >60)
Glucose, Bld: 110 mg/dL — ABNORMAL HIGH (ref 65–99)
POTASSIUM: 4.6 mmol/L (ref 3.5–5.1)
Sodium: 136 mmol/L (ref 135–145)
Total Protein: 8.1 g/dL (ref 6.5–8.1)

## 2016-07-16 LAB — CBC
HCT: 37.6 % — ABNORMAL LOW (ref 39.0–52.0)
HEMOGLOBIN: 12.1 g/dL — AB (ref 13.0–17.0)
MCH: 26.5 pg (ref 26.0–34.0)
MCHC: 32.2 g/dL (ref 30.0–36.0)
MCV: 82.3 fL (ref 78.0–100.0)
PLATELETS: 124 10*3/uL — AB (ref 150–400)
RBC: 4.57 MIL/uL (ref 4.22–5.81)
RDW: 16.6 % — ABNORMAL HIGH (ref 11.5–15.5)
WBC: 3.1 10*3/uL — AB (ref 4.0–10.5)

## 2016-07-16 LAB — POC OCCULT BLOOD, ED: FECAL OCCULT BLD: NEGATIVE

## 2016-07-16 LAB — LIPASE, BLOOD: Lipase: 24 U/L (ref 11–51)

## 2016-07-16 MED ORDER — HYDROMORPHONE HCL 1 MG/ML IJ SOLN
1.0000 mg | Freq: Once | INTRAMUSCULAR | Status: AC
  Administered 2016-07-16: 1 mg via INTRAVENOUS
  Filled 2016-07-16: qty 1

## 2016-07-16 MED ORDER — PROCHLORPERAZINE EDISYLATE 5 MG/ML IJ SOLN
10.0000 mg | Freq: Once | INTRAMUSCULAR | Status: AC
  Administered 2016-07-16: 10 mg via INTRAVENOUS
  Filled 2016-07-16: qty 2

## 2016-07-16 NOTE — ED Provider Notes (Signed)
[facility] Provider Note   CSN: [id number] Arrival date & time: 07/16/16  0865     History   Chief Complaint Chief Complaint  Patient presents with  . Abdominal Pain  . Rectal Bleeding    HPI Peter [name] is a 45 y.o. male.  Patient is a 45 year old male who presents to the emergency department with complaint of GI bleeding and abdominal pain.  Patient states that in the past he's been on diagnosed with pancreatitis, retroperitoneal bleeding, bowel obstruction, ileostomy, ileostomy takedown, and previous problems with anemia.  The current problem started 3 days ago. 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 He is now noticing blood in the stool. He's not had any recent constipation, and he states he is not noticing blood in his underwear. He's not had any recent accidents or injury involving his abdomen rectal area. There's been no recent procedures. Patient states occasionally he has a little bit of lightheadedness, but no loss of consciousness, no visual changes, and no excessive weakness. He has been having some bleeding and discomfort in the abdomen over the last 1-1/2 months, he states that it seems to come and go. His doctors are in the midst of workup, but states that the reason for this has not been found. It is of note that he has had multiple surgeries because of his pancreas issue as well as his: Issue. He denies any recent fever or chills. He denies use of any aspirin or NSAID related products. Nothing makes the situation better, and nothing seems to make it any worse.    Abdominal Pain   Associated symptoms include hematochezia and nausea. Pertinent negatives include fever, vomiting, constipation, dysuria, frequency, hematuria and arthralgias.  Rectal Bleeding  Associated symptoms: abdominal pain and light-headedness   Associated symptoms: no dizziness, no epistaxis, no fever and no vomiting     Past Medical History:    Diagnosis Date  . Bowel obstruction   . DVT (deep venous thrombosis) (HCC)    lower extremity  . Hypertension   . Ileus (HCC) 01/03/2012  . Pancreatitis   . Renal disorder    kidney stones  . Retroperitoneal bleed 01/04/2012    Patient Active Problem List   Diagnosis Date Noted  . Hyperglycemia 01/05/2012  . Hypoalbuminemia 01/04/2012  . Peripheral edema 01/04/2012  . Retroperitoneal bleed 01/04/2012  . Ileus (HCC) 01/03/2012  . Anemia 01/03/2012  . Wheezing 01/03/2012  . Rhabdomyolysis 12/31/2011  . Sepsis(995.91) 12/31/2011  . UTI (urinary tract infection) 12/31/2011  . Alcohol withdrawal (HCC) 12/30/2011  . ARF (acute renal failure) (HCC) 12/30/2011  . Hyperkalemia 12/30/2011  . Metabolic acidosis 12/30/2011  . Hypocalcemia 12/30/2011  . Fever 12/30/2011  . Hypertriglyceridemia 12/30/2011  . Acute pancreatitis 12/29/2011  . Hepatic steatosis 12/29/2011  . Chest pain 10/15/2011  . HTN (hypertension) 10/15/2011  . Tachycardia 10/15/2011  . Alcohol abuse 10/15/2011  . Obesity 10/15/2011    Past Surgical History:  Procedure Laterality Date  . chronic pancreatitiis    . COLON RESECTION    . COLOSTOMY REVERSAL    . HERNIA REPAIR    . LITHOTRIPSY    . Mucous Fistula     2013  . PANCREAS SURGERY     half of pancreas removed  . PANCREATITIC FISTULA    . SBO         Home Medications    Prior to Admission medications   Medication Sig Start Date End Date Taking? Authorizing  Provider  amitriptyline (ELAVIL) 150 MG tablet Take 150 mg by mouth at bedtime.  11/30/14  Yes Historical Provider, MD  CREON 36000 UNITS CPEP capsule Take 36,000-108,000 Units by mouth 3 (three) times daily with meals. 1 with snacks and 3 with meals 12/11/14  Yes Historical Provider, MD  folic acid (FOLVITE) 1 MG tablet Take 1 mg by mouth daily. 04/13/15  Yes Historical Provider, MD  hydrochlorothiazide (HYDRODIURIL) 25 MG tablet Take 25 mg by mouth daily.   Yes Historical Provider, MD   HYDROcodone-acetaminophen (NORCO/VICODIN) 5-325 MG tablet Take 1 tablet by mouth every 6 (six) hours as needed for severe pain. 04/24/16  Yes Shaune Pollack, MD  lisinopril (PRINIVIL,ZESTRIL) 5 MG tablet Take 5 mg by mouth daily.   Yes Historical Provider, MD  magnesium oxide (MAG-OX) 400 (241.3 MG) MG tablet Take 1 tablet by mouth 2 (two) times daily. 01/13/15  Yes Historical Provider, MD  rivaroxaban (XARELTO) 20 MG TABS tablet Take 20 mg by mouth every morning.   Yes Historical Provider, MD  topiramate (TOPAMAX) 50 MG tablet Take 50-100 mg by mouth 2 (two) times daily. 1 tablet in the Am and 2 tablets at night   Yes Historical Provider, MD  vitamin B-12 (CYANOCOBALAMIN) 1000 MCG tablet Take 1,000 mcg by mouth every Monday, Wednesday, and Friday.    Yes Historical Provider, MD  methocarbamol (ROBAXIN) 500 MG tablet Take 1 tablet (500 mg total) by mouth 3 (three) times daily. Patient not taking: Reported on 02/14/2016 01/20/16   [name], PA-C    Family History No family history on file.  Social History Social History  Substance Use Topics  . Smoking status: Never Smoker  . Smokeless tobacco: Never Used  . Alcohol use No     Comment: denies today 07/21/2015     Allergies   Ace inhibitors; Coconut flavor; Contrast media [iodinated diagnostic agents]; and Iodine   Review of Systems Review of Systems  Constitutional: Negative for activity change and fever.       All ROS Neg except as noted in HPI  HENT: Negative for nosebleeds.   Eyes: Negative for photophobia and discharge.  Respiratory: Negative for cough, shortness of breath and wheezing.   Cardiovascular: Negative for chest pain and palpitations.  Gastrointestinal: Positive for abdominal pain, blood in stool, hematochezia and nausea. Negative for constipation and vomiting.  Genitourinary: Negative for dysuria, frequency and hematuria.  Musculoskeletal: Negative for arthralgias, back pain and neck pain.  Skin: Negative.    Neurological: Positive for light-headedness. Negative for dizziness, seizures and speech difficulty.  Psychiatric/Behavioral: Negative for confusion and hallucinations.     Physical Exam Updated Vital Signs BP 118/90   Pulse 93   Temp 98.2 F (36.8 C) (Oral)   Resp 16   SpO2 100%   Physical Exam  Constitutional: He appears well-developed and well-nourished. No distress.  HENT:  Head: Normocephalic and atraumatic.  Right Ear: External ear normal.  Left Ear: External ear normal.  Eyes: Conjunctivae are normal. Right eye exhibits no discharge. Left eye exhibits no discharge. No scleral icterus.  Neck: Neck supple. No tracheal deviation present.  Cardiovascular: Normal rate, regular rhythm and intact distal pulses.   Pulmonary/Chest: Effort normal and breath sounds normal. No stridor. No respiratory distress. He has no wheezes. He has no rales.  Abdominal: There is tenderness. There is guarding.  Bowel sounds are present and active. There is an ileostomy in the right upper mid abdomen area. The stoma is pink. There is a small  amount of mucus in the bag. The abdomen exam shows diffuse tenderness present. Patient states this is not new.  Musculoskeletal: He exhibits no edema or tenderness.  Neurological: He is alert. He has normal strength. No cranial nerve deficit (no facial droop, extraocular movements intact, no slurred speech) or sensory deficit. He exhibits normal muscle tone. He displays no seizure activity. Coordination normal.  Skin: Skin is warm and dry. No rash noted.  Psychiatric: He has a normal mood and affect.  Nursing note and vitals reviewed.    ED Treatments / Results  Labs (all labs ordered are listed, but only abnormal results are displayed) Labs Reviewed  CBC - Abnormal; Notable for the following:       Result Value   WBC 3.1 (*)    Hemoglobin 12.1 (*)    HCT 37.6 (*)    RDW 16.6 (*)    Platelets 124 (*)    All other components within normal limits   COMPREHENSIVE METABOLIC PANEL  LIPASE, BLOOD  POC OCCULT BLOOD, ED  TYPE AND SCREEN    EKG  EKG Interpretation None       Radiology No results found.  Procedures Procedures (including critical care time)  Medications Ordered in ED Medications - No data to display   Initial Impression / Assessment and Plan / ED Course Patient seen with me by Dr. Jodi MourningZavitz.  I have reviewed the triage vital signs and the nursing notes.  Pertinent labs & imaging results that were available during my care of the patient were reviewed by me and considered in my medical decision making (see chart for details).  Clinical Course    *I have reviewed nursing notes, vital signs, and all appropriate lab and imaging results for this patient.*  Final Clinical Impressions(s) / ED Diagnoses    Vital signs are within normal limits. Pulse oximetry is 100% on room air. Within normal limits by my interpretation. The comprehensive metabolic panel shows a glucose to be slightly elevated at 110, otherwise within normal limits. The lipase is normal at 24. Doubt recurrence of severe pancreatitis. Patient is noted to be blood type A- Pos. The complete blood count shows a white blood cells to be low at 3.1. The hemoglobin is slightly low at 12.1, the hematocrit also low at 37.6. Platelets are low at 124,000. RBCs W NL. Stool for occult blood was found to be negative. There was no blood on the gloved finger, and no blood in the anal area.  The plan at this time is for the [place]. I have discusse with the patient, as well as Dr.Zavitz. Feel that it is safe for the patient to be discharged home with close outpatient followup.   Final diagnoses:  Rectal bleeding    New Prescriptions New Prescriptions   No medications on file     Ivery QualeHobson Jameil Whitmoyer, PA-C 07/16/16 1128    Blane OharaJoshua Zavitz, MD 07/17/16 [phone]

## 2016-07-16 NOTE — ED Triage Notes (Signed)
Pt reports has chronic abd pain and noticed bright red blood in stools since Friday.  Pt on xarelto.

## 2016-07-16 NOTE — Discharge Instructions (Addendum)
Follow up with GI doctor this week.  If you were given medicines take as directed.  If you are on coumadin or contraceptives realize their levels and effectiveness is altered by many different medicines.  If you have any reaction (rash, tongues swelling, other) to the medicines stop taking and see a physician.    If your blood pressure was elevated in the ER make sure you follow up for management with a primary doctor or return for chest pain, shortness of breath or stroke symptoms.  Please follow up as directed and return to the ER or see a physician for new or worsening symptoms.  Thank you. Vitals:   07/16/16 0827 07/16/16 0904 07/16/16 1112  BP: 131/88 118/90 110/74  Pulse: 96 93 84  Resp: 20 16 19   Temp: 98.2 F (36.8 C)    TempSrc: Oral    SpO2: 99% 100% 96%

## 2016-08-14 ENCOUNTER — Inpatient Hospital Stay (HOSPITAL_COMMUNITY)
Admission: EM | Admit: 2016-08-14 | Discharge: 2016-08-15 | DRG: 389 | Disposition: A | Discharge status: Other Acute Inpatient Hospital | Payer: Medicare Other | Attending: Internal Medicine | Admitting: Internal Medicine

## 2016-08-14 ENCOUNTER — Encounter (HOSPITAL_COMMUNITY): Payer: Self-pay | Admitting: *Deleted

## 2016-08-14 DIAGNOSIS — Z7901 Long term (current) use of anticoagulants: Secondary | ICD-10-CM

## 2016-08-14 DIAGNOSIS — K56600 Partial intestinal obstruction, unspecified as to cause: Secondary | ICD-10-CM | POA: Diagnosis not present

## 2016-08-14 DIAGNOSIS — I10 Essential (primary) hypertension: Secondary | ICD-10-CM | POA: Diagnosis present

## 2016-08-14 DIAGNOSIS — R1084 Generalized abdominal pain: Secondary | ICD-10-CM

## 2016-08-14 DIAGNOSIS — D696 Thrombocytopenia, unspecified: Secondary | ICD-10-CM | POA: Diagnosis present

## 2016-08-14 DIAGNOSIS — K56609 Unspecified intestinal obstruction, unspecified as to partial versus complete obstruction: Secondary | ICD-10-CM | POA: Diagnosis present

## 2016-08-14 DIAGNOSIS — N179 Acute kidney failure, unspecified: Secondary | ICD-10-CM | POA: Diagnosis present

## 2016-08-14 DIAGNOSIS — N189 Chronic kidney disease, unspecified: Secondary | ICD-10-CM

## 2016-08-14 DIAGNOSIS — K861 Other chronic pancreatitis: Secondary | ICD-10-CM | POA: Diagnosis present

## 2016-08-14 DIAGNOSIS — Z888 Allergy status to other drugs, medicaments and biological substances status: Secondary | ICD-10-CM

## 2016-08-14 DIAGNOSIS — I129 Hypertensive chronic kidney disease with stage 1 through stage 4 chronic kidney disease, or unspecified chronic kidney disease: Secondary | ICD-10-CM | POA: Diagnosis present

## 2016-08-14 DIAGNOSIS — R000 Tachycardia, unspecified: Secondary | ICD-10-CM | POA: Diagnosis present

## 2016-08-14 DIAGNOSIS — N183 Chronic kidney disease, stage 3 unspecified: Secondary | ICD-10-CM | POA: Diagnosis present

## 2016-08-14 DIAGNOSIS — Z91041 Radiographic dye allergy status: Secondary | ICD-10-CM

## 2016-08-14 DIAGNOSIS — Z86718 Personal history of other venous thrombosis and embolism: Secondary | ICD-10-CM

## 2016-08-14 NOTE — ED Triage Notes (Signed)
Pt c/o generalized abdominal pain x 3 days; pt states he has some nausea

## 2016-08-15 ENCOUNTER — Encounter (HOSPITAL_COMMUNITY): Payer: Self-pay | Admitting: Family Medicine

## 2016-08-15 ENCOUNTER — Emergency Department (HOSPITAL_COMMUNITY): Payer: Medicare Other

## 2016-08-15 ENCOUNTER — Inpatient Hospital Stay (HOSPITAL_COMMUNITY): Payer: Medicare Other

## 2016-08-15 DIAGNOSIS — N183 Chronic kidney disease, stage 3 unspecified: Secondary | ICD-10-CM | POA: Diagnosis present

## 2016-08-15 DIAGNOSIS — D696 Thrombocytopenia, unspecified: Secondary | ICD-10-CM | POA: Diagnosis present

## 2016-08-15 DIAGNOSIS — I10 Essential (primary) hypertension: Secondary | ICD-10-CM

## 2016-08-15 DIAGNOSIS — R000 Tachycardia, unspecified: Secondary | ICD-10-CM | POA: Diagnosis not present

## 2016-08-15 DIAGNOSIS — K56609 Unspecified intestinal obstruction, unspecified as to partial versus complete obstruction: Secondary | ICD-10-CM | POA: Diagnosis not present

## 2016-08-15 DIAGNOSIS — R1084 Generalized abdominal pain: Secondary | ICD-10-CM | POA: Insufficient documentation

## 2016-08-15 DIAGNOSIS — K6389 Other specified diseases of intestine: Secondary | ICD-10-CM | POA: Insufficient documentation

## 2016-08-15 DIAGNOSIS — Z86718 Personal history of other venous thrombosis and embolism: Secondary | ICD-10-CM | POA: Diagnosis not present

## 2016-08-15 DIAGNOSIS — R109 Unspecified abdominal pain: Secondary | ICD-10-CM | POA: Insufficient documentation

## 2016-08-15 DIAGNOSIS — Z7901 Long term (current) use of anticoagulants: Secondary | ICD-10-CM | POA: Diagnosis not present

## 2016-08-15 DIAGNOSIS — I129 Hypertensive chronic kidney disease with stage 1 through stage 4 chronic kidney disease, or unspecified chronic kidney disease: Secondary | ICD-10-CM | POA: Diagnosis not present

## 2016-08-15 DIAGNOSIS — K861 Other chronic pancreatitis: Secondary | ICD-10-CM | POA: Diagnosis not present

## 2016-08-15 DIAGNOSIS — Z888 Allergy status to other drugs, medicaments and biological substances status: Secondary | ICD-10-CM | POA: Diagnosis not present

## 2016-08-15 DIAGNOSIS — N179 Acute kidney failure, unspecified: Secondary | ICD-10-CM | POA: Diagnosis not present

## 2016-08-15 DIAGNOSIS — Z91041 Radiographic dye allergy status: Secondary | ICD-10-CM | POA: Diagnosis not present

## 2016-08-15 DIAGNOSIS — K56600 Partial intestinal obstruction, unspecified as to cause: Secondary | ICD-10-CM | POA: Diagnosis not present

## 2016-08-15 LAB — CBC WITH DIFFERENTIAL/PLATELET
BASOS ABS: 0 10*3/uL (ref 0.0–0.1)
BASOS PCT: 0 %
EOS ABS: 0.1 10*3/uL (ref 0.0–0.7)
EOS PCT: 1 %
HCT: 40.3 % (ref 39.0–52.0)
Hemoglobin: 13.3 g/dL (ref 13.0–17.0)
LYMPHS PCT: 15 %
Lymphs Abs: 0.9 10*3/uL (ref 0.7–4.0)
MCH: 27.1 pg (ref 26.0–34.0)
MCHC: 33 g/dL (ref 30.0–36.0)
MCV: 82.2 fL (ref 78.0–100.0)
Monocytes Absolute: 0.5 10*3/uL (ref 0.1–1.0)
Monocytes Relative: 9 %
Neutro Abs: 4.5 10*3/uL (ref 1.7–7.7)
Neutrophils Relative %: 75 %
PLATELETS: 120 10*3/uL — AB (ref 150–400)
RBC: 4.9 MIL/uL (ref 4.22–5.81)
RDW: 15.7 % — ABNORMAL HIGH (ref 11.5–15.5)
WBC: 6 10*3/uL (ref 4.0–10.5)

## 2016-08-15 LAB — URINALYSIS, ROUTINE W REFLEX MICROSCOPIC
Bilirubin Urine: NEGATIVE
Glucose, UA: NEGATIVE mg/dL
LEUKOCYTES UA: NEGATIVE
NITRITE: NEGATIVE
PROTEIN: NEGATIVE mg/dL
Specific Gravity, Urine: >1.03 — ABNORMAL HIGH (ref 1.005–1.030)
pH: 5.5 (ref 5.0–8.0)

## 2016-08-15 LAB — RAPID URINE DRUG SCREEN, HOSP PERFORMED
AMPHETAMINES: NOT DETECTED
Barbiturates: NOT DETECTED
Benzodiazepines: NOT DETECTED
Cocaine: NOT DETECTED
Opiates: NOT DETECTED
Tetrahydrocannabinol: NOT DETECTED

## 2016-08-15 LAB — COMPREHENSIVE METABOLIC PANEL
ALT: 22 U/L (ref 17–63)
AST: 21 U/L (ref 15–41)
Albumin: 4.7 g/dL (ref 3.5–5.0)
Alkaline Phosphatase: 93 U/L (ref 38–126)
Anion gap: 9 (ref 5–15)
BUN: 22 mg/dL — ABNORMAL HIGH (ref 6–20)
CALCIUM: 9.8 mg/dL (ref 8.9–10.3)
CHLORIDE: 101 mmol/L (ref 101–111)
CO2: 22 mmol/L (ref 22–32)
CREATININE: 2.42 mg/dL — AB (ref 0.61–1.24)
GFR, EST AFRICAN AMERICAN: 35 mL/min — AB (ref >60)
GFR, EST NON AFRICAN AMERICAN: 30 mL/min — AB (ref >60)
Glucose, Bld: 144 mg/dL — ABNORMAL HIGH (ref 65–99)
Potassium: 3.5 mmol/L (ref 3.5–5.1)
Sodium: 132 mmol/L — ABNORMAL LOW (ref 135–145)
TOTAL PROTEIN: 8.6 g/dL — AB (ref 6.5–8.1)
Total Bilirubin: 1 mg/dL (ref 0.3–1.2)

## 2016-08-15 LAB — URINE MICROSCOPIC-ADD ON

## 2016-08-15 LAB — APTT
aPTT: 35 seconds (ref 24–36)
aPTT: 56 seconds — ABNORMAL HIGH (ref 24–36)
aPTT: 72 seconds — ABNORMAL HIGH (ref 24–36)

## 2016-08-15 LAB — I-STAT CG4 LACTIC ACID, ED: LACTIC ACID, VENOUS: 0.93 mmol/L (ref 0.5–1.9)

## 2016-08-15 LAB — LACTIC ACID, PLASMA
LACTIC ACID, VENOUS: 1.2 mmol/L (ref 0.5–1.9)
LACTIC ACID, VENOUS: 1.6 mmol/L (ref 0.5–1.9)

## 2016-08-15 LAB — GLUCOSE, CAPILLARY: Glucose-Capillary: 134 mg/dL — ABNORMAL HIGH (ref 65–99)

## 2016-08-15 LAB — LIPASE, BLOOD: LIPASE: 19 U/L (ref 11–51)

## 2016-08-15 LAB — HEPARIN LEVEL (UNFRACTIONATED)

## 2016-08-15 MED ORDER — ONDANSETRON HCL 4 MG PO TABS: 4.0000 mg | ORAL_TABLET | Freq: Four times a day (QID) | ORAL | Status: DC | PRN

## 2016-08-15 MED ORDER — ONDANSETRON HCL 4 MG/2ML IJ SOLN
4.0000 mg | Freq: Four times a day (QID) | INTRAMUSCULAR | Status: DC | PRN
  Administered 2016-08-15: 4 mg via INTRAVENOUS
  Filled 2016-08-15: qty 2

## 2016-08-15 MED ORDER — SODIUM CHLORIDE 0.9 % IV BOLUS (SEPSIS)
1000.0000 mL | Freq: Once | INTRAVENOUS | Status: AC
  Administered 2016-08-15: 1000 mL via INTRAVENOUS

## 2016-08-15 MED ORDER — FENTANYL CITRATE (PF) 100 MCG/2ML IJ SOLN
100.0000 ug | INTRAMUSCULAR | Status: DC | PRN
  Administered 2016-08-15 (×2): 100 ug via INTRAVENOUS
  Filled 2016-08-15 (×2): qty 2

## 2016-08-15 MED ORDER — SODIUM CHLORIDE 0.9% FLUSH: 3.0000 mL | Freq: Two times a day (BID) | INTRAVENOUS | Status: DC

## 2016-08-15 MED ORDER — HEPARIN (PORCINE) IN NACL 100-0.45 UNIT/ML-% IJ SOLN: 1100.0000 [IU]/h | INTRAMUSCULAR | Status: DC

## 2016-08-15 MED ORDER — MORPHINE SULFATE (PF) 4 MG/ML IV SOLN
4.0000 mg | Freq: Once | INTRAVENOUS | Status: DC
  Filled 2016-08-15: qty 1

## 2016-08-15 MED ORDER — HYDROMORPHONE HCL 1 MG/ML IJ SOLN
1.0000 mg | Freq: Once | INTRAMUSCULAR | Status: AC
  Administered 2016-08-15: 1 mg via INTRAVENOUS
  Filled 2016-08-15: qty 1

## 2016-08-15 MED ORDER — FOLIC ACID 5 MG/ML IJ SOLN
1.0000 mg | Freq: Every day | INTRAMUSCULAR | Status: DC
  Administered 2016-08-15: 1 mg via INTRAVENOUS
  Filled 2016-08-15 (×3): qty 0.2

## 2016-08-15 MED ORDER — FENTANYL CITRATE (PF) 100 MCG/2ML IJ SOLN
50.0000 ug | Freq: Once | INTRAMUSCULAR | Status: AC
  Administered 2016-08-15: 50 ug via INTRAVENOUS
  Filled 2016-08-15: qty 2

## 2016-08-15 MED ORDER — HEPARIN (PORCINE) IN NACL 100-0.45 UNIT/ML-% IJ SOLN
1100.0000 [IU]/h | INTRAMUSCULAR | Status: DC
  Administered 2016-08-15: 900 [IU]/h via INTRAVENOUS
  Filled 2016-08-15: qty 250

## 2016-08-15 MED ORDER — SODIUM CHLORIDE 0.9 % IV SOLN
INTRAVENOUS | Status: DC
  Administered 2016-08-15: 05:00:00 via INTRAVENOUS

## 2016-08-15 MED ORDER — ONDANSETRON HCL 4 MG/2ML IJ SOLN
4.0000 mg | Freq: Once | INTRAMUSCULAR | Status: AC
  Administered 2016-08-15: 4 mg via INTRAVENOUS
  Filled 2016-08-15: qty 2

## 2016-08-15 MED ORDER — SODIUM CHLORIDE 0.9 % IV BOLUS (SEPSIS)
1000.0000 mL | Freq: Once | INTRAVENOUS | Status: AC
Start: 2016-08-15 — End: 2016-08-15
  Administered 2016-08-15: 1000 mL via INTRAVENOUS

## 2016-08-15 MED ORDER — ACETAMINOPHEN 650 MG RE SUPP: 650.0000 mg | Freq: Four times a day (QID) | RECTAL | Status: DC | PRN

## 2016-08-15 MED ORDER — ONDANSETRON HCL 4 MG/2ML IJ SOLN: 4.0000 mg | Freq: Four times a day (QID) | INTRAMUSCULAR | Status: DC | PRN

## 2016-08-15 MED ORDER — BARIUM SULFATE 2.1 % PO SUSP
ORAL | Status: AC
  Administered 2016-08-15: 01:00:00
  Filled 2016-08-15: qty 2

## 2016-08-15 MED ORDER — FENTANYL CITRATE (PF) 100 MCG/2ML IJ SOLN
25.0000 ug | INTRAMUSCULAR | Status: DC | PRN
  Administered 2016-08-15 (×7): 50 ug via INTRAVENOUS
  Filled 2016-08-15 (×7): qty 2

## 2016-08-15 MED ORDER — ACETAMINOPHEN 325 MG PO TABS: 650.0000 mg | ORAL_TABLET | Freq: Four times a day (QID) | ORAL | Status: DC | PRN

## 2016-08-15 MED ORDER — SODIUM CHLORIDE 0.9 % IV SOLN: INTRAVENOUS | Status: AC

## 2016-08-15 NOTE — Progress Notes (Signed)
ANTICOAGULATION CONSULT NOTE - follow up  Pharmacy Consult for HEPARIN Indication: h/o DVT  Allergies  Allergen Reactions  . Ace Inhibitors Swelling    Lip swelling  . Coconut Flavor Swelling  . Contrast Media [Iodinated Diagnostic Agents]     Patient is allergic to IV contrast per Dr Rolland PorterMark James.  . Iodine     Patient experienced shortness of breath and chest tightness. Albuterol and benadryl were required.   Patient Measurements: Height: 5\' 10"  (177.8 cm) Weight: 251 lb 1.6 oz (113.9 kg) IBW/kg (Calculated) : 73 HEPARIN DW (KG): 97.1  Vital Signs: Temp: 98 F (36.7 C) (11/08 0650) Temp Source: Oral (11/08 0650) BP: 114/78 (11/08 0650) Pulse Rate: 97 (11/08 0650)  Labs:  Recent Labs  08/15/16 0019 08/15/16 0606 08/15/16 0830 08/15/16 1345  HGB 13.3  --   --   --   HCT 40.3  --   --   --   PLT 120*  --   --   --   APTT  --   --  35 56*  HEPARINUNFRC  --  >2.20*  --   --   CREATININE 2.42*  --   --   --    Estimated Creatinine Clearance: 47.7 mL/min (by C-G formula based on SCr of 2.42 mg/dL (H)).  Medical History: Past Medical History:  Diagnosis Date  . Bowel obstruction   . DVT (deep venous thrombosis) (HCC)    lower extremity  . Hypertension   . Ileus (HCC) 01/03/2012  . Pancreatitis   . Renal disorder    kidney stones  . Retroperitoneal bleed 01/04/2012   Medications:  Prescriptions Prior to Admission  Medication Sig Dispense Refill Last Dose  . amitriptyline (ELAVIL) 150 MG tablet Take 150 mg by mouth at bedtime.    08/13/2016 at Unknown time  . Cholecalciferol (VITAMIN D PO) Take 1 tablet by mouth daily.   08/14/2016 at Unknown time  . CREON 36000 UNITS CPEP capsule Take 36,000-108,000 Units by mouth 3 (three) times daily with meals. 1 with snacks and 3 with meals   08/14/2016 at Unknown time  . folic acid (FOLVITE) 1 MG tablet Take 1 mg by mouth daily.   08/14/2016 at Unknown time  . hydrochlorothiazide (HYDRODIURIL) 25 MG tablet Take 25 mg by mouth  daily.   08/14/2016 at Unknown time  . HYDROcodone-acetaminophen (NORCO/VICODIN) 5-325 MG tablet Take 1 tablet by mouth every 6 (six) hours as needed for severe pain. 8 tablet 0 08/14/2016 at Unknown time  . lisinopril (PRINIVIL,ZESTRIL) 5 MG tablet Take 5 mg by mouth daily.   08/14/2016 at Unknown time  . magnesium oxide (MAG-OX) 400 (241.3 MG) MG tablet Take 1 tablet by mouth 2 (two) times daily.   08/14/2016 at Unknown time  . Multiple Vitamin (MULTIVITAMIN WITH MINERALS) TABS tablet Take 1 tablet by mouth daily.   08/14/2016 at Unknown time  . rivaroxaban (XARELTO) 20 MG TABS tablet Take 20 mg by mouth every morning.   08/14/2016 at 0800  . sildenafil (REVATIO) 20 MG tablet Take 5 tablets by mouth daily as needed for erectile dysfunction.     . topiramate (TOPAMAX) 50 MG tablet Take 50-100 mg by mouth 2 (two) times daily. 1 tablet in the Am and 2 tablets at night   08/14/2016 at Unknown time  . vitamin B-12 (CYANOCOBALAMIN) 1000 MCG tablet Take 1,000 mcg by mouth every Monday, Wednesday, and Friday.    08/13/2016 at Unknown time   Assessment: 45yo male with complicated  medical hx.  Pt was on Xarelto PTA for h/o DVT with last dose reportedly 11/7.  Heparin level is > 2 due to Xarelto Rx.  Asked to initiate Heparin while Xarelto on hold.  Will need to use aPTT to manage heparin until Xarelto effect diminished.  Baseline labs reviewed.    Goal of Therapy:  APTT 62-102 Monitor platelets by anticoagulation protocol: Yes   Plan:  Increase Heparin to 1100 units/hr infusion APTT in 6-8 hrs then daily CBC daily while on Heparin  Margo AyeHall, Azyria Osmon A 08/15/2016,2:44 PM

## 2016-08-15 NOTE — ED Provider Notes (Signed)
[facility]-EMERGENCY DEPT Provider Note   CSN: [id number] Arrival date & time: 08/14/16  2347     History   Chief Complaint Chief Complaint  Patient presents with  . Abdominal Pain    HPI Peter [name] is a 45 y.o. male.  Peter [name] is a 45 y.o. Male with a history of pancreatitis, bowel obstruction, ileostomy, ileostomy take down, and DVT on Xarelto who presents to the ED complaining of three days of generalized abdominal pain with nausea. He reports this feels like his pancreatitis. He also feels his abdomen is distended. He reports diarrhea over the past three days and one loose stool today. He reports having some bright red blood streaked in his stool since starting xarelto. He has a mucus fistula. He is followed by GI at [facility]. He reports feeling lightheaded with position change today. He denies fevers, chest pain, cough, hematemesis, vomiting, urinary symptoms, or rashes.   The history is provided by the patient and medical records. No language interpreter was used.  Abdominal Pain   Associated symptoms include diarrhea and nausea. Pertinent negatives include fever, vomiting, dysuria, frequency, hematuria and headaches.    Past Medical History:  Diagnosis Date  . Bowel obstruction   . DVT (deep venous thrombosis) (HCC)    lower extremity  . Hypertension   . Ileus (HCC) 01/03/2012  . Pancreatitis   . Renal disorder    kidney stones  . Retroperitoneal bleed 01/04/2012    Patient Active Problem List   Diagnosis Date Noted  . Hyperglycemia 01/05/2012  . Hypoalbuminemia 01/04/2012  . Peripheral edema 01/04/2012  . Retroperitoneal bleed 01/04/2012  . Ileus (HCC) 01/03/2012  . Anemia 01/03/2012  . Wheezing 01/03/2012  . Rhabdomyolysis 12/31/2011  . Sepsis(995.91) 12/31/2011  . UTI (urinary tract infection) 12/31/2011  . Alcohol withdrawal (HCC) 12/30/2011  . ARF (acute renal failure) (HCC) 12/30/2011  . Hyperkalemia 12/30/2011  . Metabolic acidosis 12/30/2011   . Hypocalcemia 12/30/2011  . Fever 12/30/2011  . Hypertriglyceridemia 12/30/2011  . Acute pancreatitis 12/29/2011  . Hepatic steatosis 12/29/2011  . Chest pain 10/15/2011  . HTN (hypertension) 10/15/2011  . Tachycardia 10/15/2011  . Alcohol abuse 10/15/2011  . Obesity 10/15/2011    Past Surgical History:  Procedure Laterality Date  . chronic pancreatitiis    . COLON RESECTION    . COLOSTOMY REVERSAL    . HERNIA REPAIR    . LITHOTRIPSY    . Mucous Fistula     2013  . PANCREAS SURGERY     half of pancreas removed  . PANCREATITIC FISTULA    . SBO         Home Medications    Prior to Admission medications   Medication Sig Start Date End Date Taking? Authorizing Provider  amitriptyline (ELAVIL) 150 MG tablet Take 150 mg by mouth at bedtime.  11/30/14   Historical Provider, MD  CREON 36000 UNITS CPEP capsule Take 36,000-108,000 Units by mouth 3 (three) times daily with meals. 1 with snacks and 3 with meals 12/11/14   Historical Provider, MD  folic acid (FOLVITE) 1 MG tablet Take 1 mg by mouth daily. 04/13/15   Historical Provider, MD  hydrochlorothiazide (HYDRODIURIL) 25 MG tablet Take 25 mg by mouth daily.    Historical Provider, MD  HYDROcodone-acetaminophen (NORCO/VICODIN) 5-325 MG tablet Take 1 tablet by mouth every 6 (six) hours as needed for severe pain. 04/24/16   Shaune Pollackameron Isaacs, MD  lisinopril (PRINIVIL,ZESTRIL) 5 MG tablet Take 5 mg by mouth daily.  Historical Provider, MD  magnesium oxide (MAG-OX) 400 (241.3 MG) MG tablet Take 1 tablet by mouth 2 (two) times daily. 01/13/15   Historical Provider, MD  methocarbamol (ROBAXIN) 500 MG tablet Take 1 tablet (500 mg total) by mouth 3 (three) times daily. Patient not taking: Reported on 02/14/2016 01/20/16   Ivery QualeHobson Bryant, PA-C  rivaroxaban (XARELTO) 20 MG TABS tablet Take 20 mg by mouth every morning.    Historical Provider, MD  topiramate (TOPAMAX) 50 MG tablet Take 50-100 mg by mouth 2 (two) times daily. 1 tablet in the Am and 2  tablets at night    Historical Provider, MD  vitamin B-12 (CYANOCOBALAMIN) 1000 MCG tablet Take 1,000 mcg by mouth every Monday, Wednesday, and Friday.     Historical Provider, MD    Family History History reviewed. No pertinent family history.  Social History Social History  Substance Use Topics  . Smoking status: Never Smoker  . Smokeless tobacco: Never Used  . Alcohol use No     Comment: denies today 07/21/2015     Allergies   Ace inhibitors; Coconut flavor; Contrast media [iodinated diagnostic agents]; and Iodine   Review of Systems Review of Systems  Constitutional: Negative for chills and fever.  HENT: Negative for congestion and sore throat.   Eyes: Negative for visual disturbance.  Respiratory: Negative for cough and shortness of breath.   Cardiovascular: Negative for chest pain.  Gastrointestinal: Positive for abdominal pain, blood in stool (chronic and unchanged ), diarrhea and nausea. Negative for vomiting.  Genitourinary: Negative for dysuria, frequency and hematuria.  Musculoskeletal: Negative for back pain and neck pain.  Skin: Negative for rash.  Neurological: Positive for light-headedness. Negative for weakness, numbness and headaches.     Physical Exam Updated Vital Signs BP 95/71 (BP Location: Left Arm)   Pulse 88   Temp 98.3 F (36.8 C) (Oral)   Resp 16   Ht 5\' 10"  (1.778 m)   Wt 110.7 kg   SpO2 96%   BMI 35.01 kg/m   Physical Exam  Constitutional: He is oriented to person, place, and time. He appears well-developed and well-nourished. No distress.  HENT:  Head: Normocephalic and atraumatic.  Mouth/Throat: Oropharynx is clear and moist.  Mucous membranes are moist.  Eyes: Conjunctivae are normal. Pupils are equal, round, and reactive to light. Right eye exhibits no discharge. Left eye exhibits no discharge.  Neck: Normal range of motion. Neck supple.  Cardiovascular: Normal rate, normal heart sounds and intact distal pulses.   Heart rate is  106.  Pulmonary/Chest: Effort normal and breath sounds normal. No respiratory distress. He has no wheezes. He has no rales.  Abdominal: Soft. Bowel sounds are normal. He exhibits distension. There is tenderness.  Abdomen is soft and has generalized TTP without focal tenderness.   Musculoskeletal: He exhibits no edema.  Lymphadenopathy:    He has no cervical adenopathy.  Neurological: He is alert and oriented to person, place, and time. Coordination normal.  Skin: Skin is warm and dry. Capillary refill takes less than 2 seconds. No rash noted. He is not diaphoretic. No erythema. No pallor.  Psychiatric: He has a normal mood and affect. His behavior is normal.  Nursing note and vitals reviewed.    ED Treatments / Results  Labs (all labs ordered are listed, but only abnormal results are displayed) Labs Reviewed  CBC WITH DIFFERENTIAL/PLATELET - Abnormal; Notable for the following:       Result Value   RDW 15.7 (*)  Platelets 120 (*)    All other components within normal limits  COMPREHENSIVE METABOLIC PANEL  LIPASE, BLOOD  URINALYSIS, ROUTINE W REFLEX MICROSCOPIC (NOT AT [facility])  RAPID URINE DRUG SCREEN, HOSP PERFORMED  I-STAT CG4 LACTIC ACID, ED    EKG  EKG Interpretation None       Radiology No results found.  Procedures Procedures (including critical care time)  Medications Ordered in ED Medications  sodium chloride 0.9 % bolus 1,000 mL (1,000 mLs Intravenous New Bag/Given 08/15/16 0026)  ondansetron (ZOFRAN) injection 4 mg (4 mg Intravenous Given 08/15/16 0030)  Barium Sulfate 2.1 % SUSP (  Given by Other 08/15/16 0055)  fentaNYL (SUBLIMAZE) injection 50 mcg (50 mcg Intravenous Given 08/15/16 0039)     Initial Impression / Assessment and Plan / ED Course  I have reviewed the triage vital signs and the nursing notes.  Pertinent labs & imaging results that were available during my care of the patient were reviewed by me and considered in my medical decision making  (see chart for details).  Clinical Course    This is a 45 y.o. Male with a history of pancreatitis, bowel obstruction, ileostomy, ileostomy take down, and DVT on Xarelto who presents to the ED complaining of three days of generalized abdominal pain with nausea. He reports this feels like his pancreatitis. He also feels his abdomen is distended. He reports diarrhea over the past three days and one loose stool today. He reports having some bright red blood streaked in his stool since starting xarelto. He has a mucus fistula. He is followed by GI at [facility]. He reports feeling lightheaded with position change today.  On exam the patient is afebrile and nontoxic appearing. Slightly tachycardia with heart rate 106. He appears uncomfortable. Blood pressure slightly soft at 98/77. Patient's abdomen is soft and mildly distended. He has generalized abdominal tenderness to palpation without focal tenderness. Mucus fistula bag is in place to his right lower quadrant. Blood work, fluid bolus and nausea medication ordered. Will obtain CT abdomen and pelvis without contrast to the patient's contrast allergy.  At shift change patient is awaiting CT scan. Patient care handed off to Dr. Elesa Massed who will disposition the patient.   Final Clinical Impressions(s) / ED Diagnoses   Final diagnoses:  Generalized abdominal pain    New Prescriptions New Prescriptions   No medications on file     Everlene Farrier, PA-C 08/15/16 0126

## 2016-08-15 NOTE — Consult Note (Signed)
Medical Consultation   Peter [name]:[id number] DOB: 04-Sep-1971 DOA: 08/14/2016  Requesting Physician:  Dr. Elesa Massed (Emergency Medicine)  Reason for consultation:  Admission   Patient coming from: Home  Chief Complaint: Abdominal pain and distension   HPI: Peter [name] is a 45 y.o. 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 Patient had been in his usual state of health until the insidious development of abdominal distention and pain in the lower quadrants several days ago. Pain is worse with quick movements or oral intake. Symptoms were initially intermittent and mild, but have progressed. He presents today with constant pain in the lower abdomen. There is nausea, but no vomiting and no diarrhea. He describes his symptoms as similar to his prior experiences with pancreatitis.  ED Course: Upon arrival to the ED, patient is found to be afebrile, saturating adequately on room air, mildly tachycardic in the low 100s, and with initial blood pressure of 98/77. Chemistry panel is notable for a serum creatinine of 2.42, up from an apparent baseline 1.6-1.7. CBC is notable for a stable thrombocytopenia with platelets of 120,000. Lactic acid is reassuring at 0.93, urine drug screen is negative, lipase is within normal limits, and urinalysis is notable for an elevated specific gravity. Patient was given 2 L of normal saline with resolution of the tachycardia and improvement in blood pressures which have since remained stable. CT of the abdomen and pelvis was obtained and findings are consistent with a high-grade small bowel obstruction, possibly due to adhesions, and with pneumatosis intestinalis. NG tube was placed to  suction and patient was treated with multiple doses of IV Zofran and fentanyl. Patient remained hemodynamically stable in the ED and in no apparent respiratory distress. He was initially offered transfer to [facility] where he follows with surgery. Patient preferred to stay here at [facility] for admission.  Review of Systems:  All other systems reviewed and apart from HPI, are negative.  Past Medical History:  Diagnosis Date  . Bowel obstruction   . DVT (deep venous thrombosis) (HCC)    lower extremity  . Hypertension   . Ileus (HCC) 01/03/2012  . Pancreatitis   . Renal disorder    kidney stones  . Retroperitoneal bleed 01/04/2012    Past Surgical History:  Procedure Laterality Date  . chronic pancreatitiis    . COLON RESECTION    . COLOSTOMY REVERSAL    . HERNIA REPAIR    . LITHOTRIPSY    . Mucous Fistula     2013  . PANCREAS SURGERY     half of pancreas removed  . PANCREATITIC FISTULA    . SBO       reports that he has never smoked. He has never used smokeless tobacco. He reports that he does not drink alcohol or use drugs.  Allergies  Allergen Reactions  . Ace Inhibitors Swelling    Lip swelling  . Coconut Flavor Swelling  . Contrast Media [Iodinated Diagnostic Agents]     Patient is allergic to IV contrast per Dr Rolland Porter.  . Iodine     Patient experienced shortness of breath and chest tightness. Albuterol and benadryl were required.    History reviewed. No pertinent family history.   Prior to Admission medications  Medication Sig Start Date End Date Taking? Authorizing Provider  amitriptyline (ELAVIL) 150 MG tablet Take 150 mg by mouth at bedtime.  11/30/14   Historical Provider, MD  CREON 36000 UNITS CPEP capsule Take 36,000-108,000 Units by mouth 3 (three) times daily with meals. 1 with snacks and 3 with meals 12/11/14   Historical Provider, MD  folic acid (FOLVITE) 1 MG tablet Take 1 mg by mouth daily. 04/13/15   Historical  Provider, MD  hydrochlorothiazide (HYDRODIURIL) 25 MG tablet Take 25 mg by mouth daily.    Historical Provider, MD  HYDROcodone-acetaminophen (NORCO/VICODIN) 5-325 MG tablet Take 1 tablet by mouth every 6 (six) hours as needed for severe pain. 04/24/16   Shaune Pollackameron Isaacs, MD  lisinopril (PRINIVIL,ZESTRIL) 5 MG tablet Take 5 mg by mouth daily.    Historical Provider, MD  magnesium oxide (MAG-OX) 400 (241.3 MG) MG tablet Take 1 tablet by mouth 2 (two) times daily. 01/13/15   Historical Provider, MD  methocarbamol (ROBAXIN) 500 MG tablet Take 1 tablet (500 mg total) by mouth 3 (three) times daily. Patient not taking: Reported on 02/14/2016 01/20/16   Ivery QualeHobson Bryant, PA-C  rivaroxaban (XARELTO) 20 MG TABS tablet Take 20 mg by mouth every morning.    Historical Provider, MD  topiramate (TOPAMAX) 50 MG tablet Take 50-100 mg by mouth 2 (two) times daily. 1 tablet in the Am and 2 tablets at night    Historical Provider, MD  vitamin B-12 (CYANOCOBALAMIN) 1000 MCG tablet Take 1,000 mcg by mouth every Monday, Wednesday, and Friday.     Historical Provider, MD    Physical Exam: Vitals:   08/15/16 0103 08/15/16 0147 08/15/16 0235 08/15/16 0432  BP: 95/71 109/82 112/85 125/91  Pulse: 88 88 85 88  Resp: 16 18 18 18   Temp:      TempSrc:      SpO2: 96% 99% 100% 99%  Weight:      Height:          Constitutional: NAD, calm, in apparent discomfort Eyes: PERTLA, lids and conjunctivae normal ENMT: Mucous membranes are moist. Posterior pharynx clear of any exudate or lesions.   Neck: normal, supple, no masses, no thyromegaly Respiratory: clear to auscultation bilaterally, no wheezing, no crackles. Normal respiratory effort.  use.  Cardiovascular: S1 & S2 heard, regular rate and rhythm. No extremity edema. No significant JVD. Abdomen: Abdomen is mildly distended and exquisitely tender throughout. There is rebound tenderness and guarding. Occasional high-pitched bowel sound.  Musculoskeletal: no clubbing / cyanosis.  No joint deformity upper and lower extremities. Normal muscle tone.  Skin: no significant rashes, lesions, ulcers. Warm, dry, well-perfused. Neurologic: CN 2-12 grossly intact. Sensation intact, DTR normal. Strength 5/5 in all 4 limbs.  Psychiatric: Normal judgment and insight. Alert and oriented x 3. Normal mood and affect.     Labs on Admission: I have personally reviewed following labs and imaging studies  CBC:  Recent Labs Lab 08/15/16 0019  WBC 6.0  NEUTROABS 4.5  HGB 13.3  HCT 40.3  MCV 82.2  PLT 120*   Basic Metabolic Panel:  Recent Labs Lab 08/15/16 0019  NA 132*  K 3.5  CL 101  CO2 22  GLUCOSE 144*  BUN 22*  CREATININE 2.42*  CALCIUM 9.8   GFR: Estimated Creatinine Clearance: 47 mL/min (by C-G formula based on SCr of 2.42 mg/dL (H)). Liver Function Tests:  Recent Labs Lab 08/15/16 0019  AST 21  ALT 22  ALKPHOS 93  BILITOT 1.0  PROT 8.6*  ALBUMIN  4.7    Recent Labs Lab 08/15/16 0019  LIPASE 19   No results for input(s): AMMONIA in the last 168 hours. Coagulation Profile: No results for input(s): INR, PROTIME in the last 168 hours. Cardiac Enzymes: No results for input(s): CKTOTAL, CKMB, CKMBINDEX, TROPONINI in the last 168 hours. BNP (last 3 results) No results for input(s): PROBNP in the last 8760 hours. HbA1C: No results for input(s): HGBA1C in the last 72 hours. CBG: No results for input(s): GLUCAP in the last 168 hours. Lipid Profile: No results for input(s): CHOL, HDL, LDLCALC, TRIG, CHOLHDL, LDLDIRECT in the last 72 hours. Thyroid Function Tests: No results for input(s): TSH, T4TOTAL, FREET4, T3FREE, THYROIDAB in the last 72 hours. Anemia Panel: No results for input(s): VITAMINB12, FOLATE, FERRITIN, TIBC, IRON, RETICCTPCT in the last 72 hours. Urine analysis:    Component Value Date/Time   COLORURINE YELLOW 08/15/2016 0117   APPEARANCEUR CLEAR 08/15/2016 0117   LABSPEC >1.030 (H) 08/15/2016 0117   PHURINE 5.5 08/15/2016 0117     GLUCOSEU NEGATIVE 08/15/2016 0117   HGBUR LARGE (A) 08/15/2016 0117   BILIRUBINUR NEGATIVE 08/15/2016 0117   KETONESUR TRACE (A) 08/15/2016 0117   PROTEINUR NEGATIVE 08/15/2016 0117   UROBILINOGEN 0.2 07/27/2015 2300   NITRITE NEGATIVE 08/15/2016 0117   LEUKOCYTESUR NEGATIVE 08/15/2016 0117   Sepsis Labs: @LABRCNTIP (procalcitonin:4,lacticidven:4) )No results found for this or any previous visit (from the past 240 hour(s)).   Radiological Exams on Admission: Ct Abdomen Pelvis Wo Contrast  Result Date: 08/15/2016 CLINICAL DATA:  Generalized abdominal pain with nausea and diarrhea. Constipation with prevent blood per rectum. History of retroperitoneal bleed, colostomy reversal, renal disorder, ileus and small bowel obstructions. Pancreatic fistula. EXAM: CT ABDOMEN AND PELVIS WITHOUT CONTRAST TECHNIQUE: Multidetector CT imaging of the abdomen and pelvis was performed following the standard protocol without IV contrast. COMPARISON:  11/28/2015 CT FINDINGS: Lower chest: There is chronic scarring and/or atelectasis in the right lower lobe posteriorly. Minimal dependent atelectasis at the left lung base. The visualized cardiac chambers are stable and within normal limits for size. No pericardial effusion. Calcifications seen at the aortic root. Oral contrast is noted in the distal esophagus possibly from reflux or delayed esophageal emptying. Hepatobiliary: The unenhanced liver is normal. The gallbladder is not distended. No gallstones are seen. There is no biliary dilatation. Pancreas: The unenhanced pancreas is unremarkable. Spleen: There is no splenomegaly. Adrenals/Urinary Tract: Adrenal glands and both kidneys demonstrate no focal abnormality on this unenhanced study. There is no obstructive uropathy or nephrolithiasis. The bladder is slightly thick-walled but this may be due to underdistention. A cystitis is not entirely excluded but believed less likely. Stomach/Bowel: Moderate gastric distention  with oral contrast. There is dilatation of proximal small intestine from second portion of duodenum through much of the jejunum similar to prior study with air-fluid level seen in keeping with high-grade small bowel obstruction. However on the current exam, there is pneumatosis involving distended jejunal loops in left hemi abdomen the largest area of distention approximately 8 cm. No portal venous gas noted. Once again there is protrusion of a short segment of small bowel at the right lower quadrant ostomy site into the ostomy bag. Name adjacent to the exiting loop of small intestine is a small segment of contrast filled large bowel, series 2, image 50. Retained contrast material is again seen in distal colon however the large intestine is decompressed. No free intraperitoneal air. Vascular/Lymphatic: IVC filter in place.  No lymphadenopathy. Reproductive: Unremarkable Other: Mild congestion of the  central mesentery with small mesenteric lymph nodes noted. Musculoskeletal: No acute osseous abnormality. Disc space narrowing at L5-S1 with mild posterior osteophyte off the inferior endplate of L5. IMPRESSION: Abnormal fluid-filled dilated small bowel loops predominantly involving jejunum with findings keeping with high-grade small-bowel or partial small bowel obstruction complicated by pneumatosis on current exam. Findings are may be related an adhesion. No portal venous air is identified. Right lower quadrant ileostomy with protrusion of small bowel as before and and what appears to be a small parastomal hernia containing a small segment of contrast filled decompressed large bowel. Critical Value/emergent results were called by telephone at the time of interpretation on 08/15/2016 at 4:02 am to Dr. Elesa Massed , who verbally acknowledged these results. Electronically Signed   By: Tollie Eth M.D.   On: 08/15/2016 04:05    Assessment/Recomendations  1. High-grade SBO with pneumatosis intestinalis  - Initially, with  normal lactate, no fever or leukocytosis, and non-acute abdomen, it was felt that the patient could be managed conservatively here at [facility]  - Unfortunately, the patient has worsened with increase in his pain to "10/10" despite treatment with fentanyl; abdominal exam now reveals guarding with rebound tenderness  - Discussed with Dr. Elesa Massed and the patient and feel he will be best served at the tertiary institution where his primary surgeon is  - Agree with NGT, NPO status, pain-control, IVF   - Blood cultures and empiric abx could also be considered given the finding of pneumatosis -   2. Recurrent DVT on Xarelto  - Pt has been on Xarelto for recurrent DVT  - There is no suggestion of acute PE or DVT; no bleeding  - If SBO is not resolved shortly with conservative measures, will need to decide on possible parenteral AC   3. Hypertension  - BP was soft initially, but has normalized with IVF and has remained stable so far  - He is managed with HCTZ and lisinopril at home    4. AKI superimposed on CKD stage III  - SCr is 2.42 on admission, up from an apparent baseline of 1.6-1.7  - Likely a prerenal azotemia in setting reduced oral intake; soft BP on presentation  - Anticipate improvement with fluid-resuscitation; his lisinopril and HCTZ should be held    Briscoe Deutscher, MD [facility] Pager [phone]  If 7PM-7AM, please contact night-coverage [email or web address] Password [facility]  08/15/2016, 4:47 AM

## 2016-08-15 NOTE — Discharge Summary (Signed)
Physician Discharge Summary  Peter [name] ZOX:[id number] DOB: 1971/09/20 DOA: 08/14/2016  PCP: PROVIDER NOT IN SYSTEM  Admit date: 08/14/2016 Discharge date: 08/15/2016  Admitted From: home Disposition:  Transfer to [facility] for further care  Recommendations for Follow-up:  1. Patient will be transferred to [facility] for further surgery evaluation by his primary surgical team 2. Xarelto on hold since patient is NPO and he has been started on heparin infusion 3. 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 Patient had been in his usual state of health until the insidious development of abdominal distention and pain in the lower quadrants several days ago. Pain is worse with quick movements or oral intake. Symptoms were initially intermittent and mild, but have progressed. He presents today with constant pain in the lower abdomen. There is nausea, but no vomiting and no diarrhea. He describes his symptoms as similar to his prior experiences with pancreatitis  Upon arrival to the ED, patient is found to be afebrile, saturating adequately on room air, mildly tachycardic in the low 100s, and with initial blood pressure of 98/77. Chemistry panel is notable for a serum creatinine of 2.42, up from an apparent baseline 1.6-1.7. CBC is notable for a stable thrombocytopenia with platelets of 120,000. Lactic acid is reassuring at 0.93, urine drug screen is negative, lipase  is within normal limits, and urinalysis is notable for an elevated specific gravity. Patient was given 2 L of normal saline with resolution of the tachycardia and improvement in blood pressures which have since remained stable. CT of the abdomen and pelvis was obtained and findings are consistent with a high-grade small bowel obstruction, possibly due to adhesions, and with pneumatosis intestinalis. NG tube was placed to suction and patient was treated with multiple doses of IV Zofran and fentanyl. Patient remained hemodynamically stable in the ED and in no apparent respiratory distress. Given the patient's complex surgical history and concern that his pain is worsening, ED physician discussed the case with surgery at [facility] and the patient was accepted, but unfortunately there was no bed availability and the surgeon advised admission to [facility] with plan for transfer to [facility] pending bed availability.  Shortly after admission, it was noted that patient had approximately 1.5 L of NG tube output. This significantly improved his abdominal pain and distention. He has been taking Xarelto prior to admission for recurrent DVTs. Since he was nothing by mouth, this was held and he has been started on intravenous heparin infusion. It was also noted that due to dehydration, he had acute on chronic kidney disease. Lisinopril and hydrochlorothiazide were also held and patient was started on IV fluids. This will need to be followed. He has mild thrombocytopenia without any evidence of bleeding. This should also be followed. Patient has been accepted at [facility] and will be transferred there later today  Discharge Diagnoses:  Principal Problem:   SBO (small bowel obstruction) Active Problems:   HTN (hypertension)   Chronic pancreatitis (HCC)   AKI (acute kidney injury) (HCC)   CKD (chronic kidney disease), stage III  History of DVT (deep vein thrombosis)    Thrombocytopenia (HCC)    Discharge Instructions  Discharge Instructions    Diet - low sodium heart healthy    Complete by:  As directed    Increase activity slowly    Complete by:  As directed        Medication List    STOP taking these medications   hydrochlorothiazide 25 MG tablet Commonly known as:  HYDRODIURIL   lisinopril 5 MG tablet Commonly known as:  PRINIVIL,ZESTRIL   rivaroxaban 20 MG Tabs tablet Commonly known as:  XARELTO   sildenafil 20 MG tablet Commonly known as:  REVATIO     TAKE these medications   amitriptyline 150 MG tablet Commonly known as:  ELAVIL Take 150 mg by mouth at bedtime.   CREON 36000 UNITS Cpep capsule Generic drug:  lipase/protease/amylase Take 36,000-108,000 Units by mouth 3 (three) times daily with meals. 1 with snacks and 3 with meals   folic acid 1 MG tablet Commonly known as:  FOLVITE Take 1 mg by mouth daily.   heparin 100-0.45 UNIT/ML-% infusion Inject 1,100 Units/hr into the vein continuous.   HYDROcodone-acetaminophen 5-325 MG tablet Commonly known as:  NORCO/VICODIN Take 1 tablet by mouth every 6 (six) hours as needed for severe pain.   magnesium oxide 400 (241.3 Mg) MG tablet Commonly known as:  MAG-OX Take 1 tablet by mouth 2 (two) times daily.   multivitamin with minerals Tabs tablet Take 1 tablet by mouth daily.   topiramate 50 MG tablet Commonly known as:  TOPAMAX Take 50-100 mg by mouth 2 (two) times daily. 1 tablet in the Am and 2 tablets at night   vitamin B-12 1000 MCG tablet Commonly known as:  CYANOCOBALAMIN Take 1,000 mcg by mouth every Monday, Wednesday, and Friday.   VITAMIN D PO Take 1 tablet by mouth daily.       Allergies  Allergen Reactions  . Ace Inhibitors Swelling    Lip swelling  . Coconut Flavor Swelling  . Contrast Media [Iodinated Diagnostic Agents]     Patient is allergic to IV contrast per Dr Rolland Porter.  . Iodine     Patient experienced shortness of breath and chest  tightness. Albuterol and benadryl were required.    Consultations:     Procedures/Studies: Ct Abdomen Pelvis Wo Contrast  Result Date: 08/15/2016 CLINICAL DATA:  Generalized abdominal pain with nausea and diarrhea. Constipation with prevent blood per rectum. History of retroperitoneal bleed, colostomy reversal, renal disorder, ileus and small bowel obstructions. Pancreatic fistula. EXAM: CT ABDOMEN AND PELVIS WITHOUT CONTRAST TECHNIQUE: Multidetector CT imaging of the abdomen and pelvis was performed following the standard protocol without IV contrast. COMPARISON:  11/28/2015 CT FINDINGS: Lower chest: There is chronic scarring and/or atelectasis in the right lower lobe posteriorly. Minimal dependent atelectasis at the left lung base. The visualized cardiac chambers are stable and within normal limits for size. No pericardial effusion. Calcifications seen at the aortic root. Oral contrast is noted in the distal esophagus possibly from reflux or delayed esophageal emptying. Hepatobiliary: The unenhanced liver is normal. The gallbladder is not distended. No gallstones are seen. There is no biliary dilatation. Pancreas: The unenhanced pancreas is unremarkable. Spleen: There is no splenomegaly. Adrenals/Urinary Tract: Adrenal glands and both kidneys demonstrate no focal abnormality on this unenhanced study. There is no obstructive uropathy or nephrolithiasis. The bladder is slightly thick-walled but this may be due to underdistention. A cystitis is not entirely excluded but believed less likely. Stomach/Bowel:  Moderate gastric distention with oral contrast. There is dilatation of proximal small intestine from second portion of duodenum through much of the jejunum similar to prior study with air-fluid level seen in keeping with high-grade small bowel obstruction. However on the current exam, there is pneumatosis involving distended jejunal loops in left hemi abdomen the largest area of distention approximately  8 cm. No portal venous gas noted. Once again there is protrusion of a short segment of small bowel at the right lower quadrant ostomy site into the ostomy bag. Name adjacent to the exiting loop of small intestine is a small segment of contrast filled large bowel, series 2, image 50. Retained contrast material is again seen in distal colon however the large intestine is decompressed. No free intraperitoneal air. Vascular/Lymphatic: IVC filter in place.  No lymphadenopathy. Reproductive: Unremarkable Other: Mild congestion of the central mesentery with small mesenteric lymph nodes noted. Musculoskeletal: No acute osseous abnormality. Disc space narrowing at L5-S1 with mild posterior osteophyte off the inferior endplate of L5. IMPRESSION: Abnormal fluid-filled dilated small bowel loops predominantly involving jejunum with findings keeping with high-grade small-bowel or partial small bowel obstruction complicated by pneumatosis on current exam. Findings are may be related an adhesion. No portal venous air is identified. Right lower quadrant ileostomy with protrusion of small bowel as before and and what appears to be a small parastomal hernia containing a small segment of contrast filled decompressed large bowel. Critical Value/emergent results were called by telephone at the time of interpretation on 08/15/2016 at 4:02 am to Dr. Elesa Massed , who verbally acknowledged these results. Electronically Signed   By: Tollie Eth M.D.   On: 08/15/2016 04:05   Dg Chest Portable 1 View  Result Date: 08/15/2016 CLINICAL DATA:  NG placement EXAM: PORTABLE CHEST 1 VIEW COMPARISON:  Chest 08/18/2015.  CT abdomen and pelvis 08/15/2016 FINDINGS: Enteric tube is somewhat coiled in the chest with tip is demonstrated in the inferior aspect of the mediastinum towards the midline. This is probably with a distended distal esophagus. Previous CT abdomen and pelvis from 08/15/2016 does not demonstrate esophageal dilatation or hiatal hernia.  Shallow inspiration with linear atelectasis in the lung bases. No focal consolidation. No blunting of costophrenic angles. No pneumothorax. Normal heart size and pulmonary vascularity. IMPRESSION: Enteric tube tip is in the inferior aspect of the mediastinum, likely within the distal esophagus. Possible esophageal dilatation. Electronically Signed   By: Burman Nieves M.D.   On: 08/15/2016 04:56       Subjective: Patient is feeling better after NG tube decompression. Abdomen is less painful and feels less distended  Discharge Exam: Vitals:   08/15/16 0650 08/15/16 1500  BP: 114/78 111/77  Pulse: 97 95  Resp: 16 16  Temp: 98 F (36.7 C) 98.5 F (36.9 C)   Vitals:   08/15/16 0432 08/15/16 0501 08/15/16 0650 08/15/16 1500  BP: 125/91 124/80 114/78 111/77  Pulse: 88 93 97 95  Resp: 18 16 16 16   Temp:   98 F (36.7 C) 98.5 F (36.9 C)  TempSrc:   Oral Oral  SpO2: 99% 94% 97% 98%  Weight:   113.9 kg (251 lb 1.6 oz)   Height:        General: Pt is alert, awake, not in acute distress Cardiovascular: RRR, S1/S2 +, no rubs, no gallops Respiratory: CTA bilaterally, no wheezing, no rhonchi Abdominal: Soft, NT, mild distention, bowel sounds +, ostomy in RLQ Extremities: no edema, no cyanosis    The results of significant  diagnostics from this hospitalization (including imaging, microbiology, ancillary and laboratory) are listed below for reference.     Microbiology: Recent Results (from the past 240 hour(s))  Culture, blood (Routine X 2) w Reflex to ID Panel     Status: None (Preliminary result)   Collection Time: 08/15/16  6:06 AM  Result Value Ref Range Status   Specimen Description BLOOD LEFT ANTECUBITAL  Final   Special Requests BOTTLES DRAWN AEROBIC AND ANAEROBIC 6CC EACH  Final   Culture NO GROWTH <12 HOURS  Final   Report Status PENDING  Incomplete  Culture, blood (Routine X 2) w Reflex to ID Panel     Status: None (Preliminary result)   Collection Time: 08/15/16   6:06 AM  Result Value Ref Range Status   Specimen Description BLOOD BLOOD LEFT ARM  Final   Special Requests BOTTLES DRAWN AEROBIC AND ANAEROBIC 6CC EACH  Final   Culture NO GROWTH <12 HOURS  Final   Report Status PENDING  Incomplete     Labs: BNP (last 3 results) No results for input(s): BNP in the last 8760 hours. Basic Metabolic Panel:  Recent Labs Lab 08/15/16 0019  NA 132*  K 3.5  CL 101  CO2 22  GLUCOSE 144*  BUN 22*  CREATININE 2.42*  CALCIUM 9.8   Liver Function Tests:  Recent Labs Lab 08/15/16 0019  AST 21  ALT 22  ALKPHOS 93  BILITOT 1.0  PROT 8.6*  ALBUMIN 4.7    Recent Labs Lab 08/15/16 0019  LIPASE 19   No results for input(s): AMMONIA in the last 168 hours. CBC:  Recent Labs Lab 08/15/16 0019  WBC 6.0  NEUTROABS 4.5  HGB 13.3  HCT 40.3  MCV 82.2  PLT 120*   Cardiac Enzymes: No results for input(s): CKTOTAL, CKMB, CKMBINDEX, TROPONINI in the last 168 hours. BNP: Invalid input(s): POCBNP CBG:  Recent Labs Lab 08/15/16 0822  GLUCAP 134*   D-Dimer No results for input(s): DDIMER in the last 72 hours. Hgb A1c No results for input(s): HGBA1C in the last 72 hours. Lipid Profile No results for input(s): CHOL, HDL, LDLCALC, TRIG, CHOLHDL, LDLDIRECT in the last 72 hours. Thyroid function studies No results for input(s): TSH, T4TOTAL, T3FREE, THYROIDAB in the last 72 hours.  Invalid input(s): FREET3 Anemia work up No results for input(s): VITAMINB12, FOLATE, FERRITIN, TIBC, IRON, RETICCTPCT in the last 72 hours. Urinalysis    Component Value Date/Time   COLORURINE YELLOW 08/15/2016 0117   APPEARANCEUR CLEAR 08/15/2016 0117   LABSPEC >1.030 (H) 08/15/2016 0117   PHURINE 5.5 08/15/2016 0117   GLUCOSEU NEGATIVE 08/15/2016 0117   HGBUR LARGE (A) 08/15/2016 0117   BILIRUBINUR NEGATIVE 08/15/2016 0117   KETONESUR TRACE (A) 08/15/2016 0117   PROTEINUR NEGATIVE 08/15/2016 0117   UROBILINOGEN 0.2 07/27/2015 2300   NITRITE NEGATIVE  08/15/2016 0117   LEUKOCYTESUR NEGATIVE 08/15/2016 0117   Sepsis Labs Invalid input(s): PROCALCITONIN,  WBC,  LACTICIDVEN Microbiology Recent Results (from the past 240 hour(s))  Culture, blood (Routine X 2) w Reflex to ID Panel     Status: None (Preliminary result)   Collection Time: 08/15/16  6:06 AM  Result Value Ref Range Status   Specimen Description BLOOD LEFT ANTECUBITAL  Final   Special Requests BOTTLES DRAWN AEROBIC AND ANAEROBIC 6CC EACH  Final   Culture NO GROWTH <12 HOURS  Final   Report Status PENDING  Incomplete  Culture, blood (Routine X 2) w Reflex to ID Panel     Status:  None (Preliminary result)   Collection Time: 08/15/16  6:06 AM  Result Value Ref Range Status   Specimen Description BLOOD BLOOD LEFT ARM  Final   Special Requests BOTTLES DRAWN AEROBIC AND ANAEROBIC 6CC EACH  Final   Culture NO GROWTH <12 HOURS  Final   Report Status PENDING  Incomplete     Time coordinating discharge: Over 30 minutes  SIGNED:   Erick BlinksMEMON,JEHANZEB, MD  [facility] 08/15/2016, 3:42 PM Pager   If 7PM-7AM, please contact night-coverage [email or web address] Password [email or web address]

## 2016-08-15 NOTE — ED Provider Notes (Addendum)
Medical screening examination/treatment/procedure(s) were conducted as a shared visit with non-physician practitioner(s) and myself.  I personally evaluated the patient during the encounter.   EKG Interpretation None      Pt is a 45 y.o. 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  Patient has also had history of a small bowel obstruction in February 2017 and colitis in April 2017. Followed by Dr. Chestine Sporelark with general surgery [facility].  Presents to ED with 3 days of abdominal pain, distention. Mildly tachycardic and hypotensive on arrival that resolved with IVF.  Labs show no leukocytosis and normal lactate.  Pt has mild acute on chronic renal failure. Creatinine is usually between 1.6 and 1.9. Today is 2.4. Urine shows blood and many bacteria but no other sign of infection. Urine culture has been sent.  CT scan shows high-grade small bowel obstruction probably due to with pneumatosis. Pneumatosis is a new finding but he has no peritoneal signs, no leukocytosis, normal lactate. Have discussed these findings with patient and his significant other at bedside. He has [facility] been [facility] to [facility] his surgeon but states he would like to stay here at [facility]. I do not feel he has a surgical emergency and can be managed with bowel rest, NG tube, IV fluids, pain medication. Discussed with hospitalist Dr. Antionette Charpyd at 4:26 AM who agrees that patient can be admitted to a medical/surgical bed, inpatient.    Layla MawKristen N Sydney Hasten, DO 08/15/16 [phone]   Review of Operations:  1) Pancreatic Necrosectomy - 08/26/2012 2) LOA, Appendectomy, Cecostomy tube placement, End-ileostomy and mucus fistula - 10/02/2012 3) Ileostomy takedown,  ileo-descending colostomy, mucus fistula, resection of distal ileum - 09/16/2013   5:30 AM  Pt seen by Dr. Antionette Charpyd.  He feels the patient would benefit from transfer to [facility]. Dr. Chestine Sporelark is a surgical oncologist.  Discussed case with Dr. Mirian MoGreg Waters, surgeon on call. He states he would be happy to accept this patient this time they do not have any available beds. He agrees with a non-peritoneal abdomen, no leukocytosis and a normal lactate that the pneumatosis is not concerning. Discussed this with hospitalist Dr. Antionette Charpyd here at [facility].. He agrees to keep patient here at [facility]. He will consult surgery here non-emergently in the AM.  Patient and significant other updated with this plan.   Layla MawKristen N Demya Scruggs, DO 08/15/16 [phone]

## 2016-08-15 NOTE — Progress Notes (Signed)
ANTICOAGULATION CONSULT NOTE - Initial Consult  Pharmacy Consult for HEPARIN Indication: h/o DVT  Allergies  Allergen Reactions  . Ace Inhibitors Swelling    Lip swelling  . Coconut Flavor Swelling  . Contrast Media [Iodinated Diagnostic Agents]     Patient is allergic to IV contrast per Dr Rolland PorterMark James.  . Iodine     Patient experienced shortness of breath and chest tightness. Albuterol and benadryl were required.   Patient Measurements: Height: 5\' 10"  (177.8 cm) Weight: 251 lb 1.6 oz (113.9 kg) IBW/kg (Calculated) : 73 HEPARIN DW (KG): 97.1  Vital Signs: Temp: 98 F (36.7 C) (11/08 0650) Temp Source: Oral (11/08 0650) BP: 114/78 (11/08 0650) Pulse Rate: 97 (11/08 0650)  Labs:  Recent Labs  08/15/16 0019 08/15/16 0606  HGB 13.3  --   HCT 40.3  --   PLT 120*  --   APTT  --  36  HEPARINUNFRC  --  >2.20*  CREATININE 2.42*  --    Estimated Creatinine Clearance: 47.7 mL/min (by C-G formula based on SCr of 2.42 mg/dL (H)).  Medical History: Past Medical History:  Diagnosis Date  . Bowel obstruction   . DVT (deep venous thrombosis) (HCC)    lower extremity  . Hypertension   . Ileus (HCC) 01/03/2012  . Pancreatitis   . Renal disorder    kidney stones  . Retroperitoneal bleed 01/04/2012   Medications:  Prescriptions Prior to Admission  Medication Sig Dispense Refill Last Dose  . amitriptyline (ELAVIL) 150 MG tablet Take 150 mg by mouth at bedtime.    07/15/2016 at Unknown time  . CREON 36000 UNITS CPEP capsule Take 36,000-108,000 Units by mouth 3 (three) times daily with meals. 1 with snacks and 3 with meals   07/15/2016 at Unknown time  . folic acid (FOLVITE) 1 MG tablet Take 1 mg by mouth daily.   07/15/2016 at Unknown time  . hydrochlorothiazide (HYDRODIURIL) 25 MG tablet Take 25 mg by mouth daily.   07/15/2016 at Unknown time  . HYDROcodone-acetaminophen (NORCO/VICODIN) 5-325 MG tablet Take 1 tablet by mouth every 6 (six) hours as needed for severe pain. 8 tablet 0  07/15/2016 at Unknown time  . lisinopril (PRINIVIL,ZESTRIL) 5 MG tablet Take 5 mg by mouth daily.   07/16/2016 at Unknown time  . magnesium oxide (MAG-OX) 400 (241.3 MG) MG tablet Take 1 tablet by mouth 2 (two) times daily.   07/15/2016 at Unknown time  . methocarbamol (ROBAXIN) 500 MG tablet Take 1 tablet (500 mg total) by mouth 3 (three) times daily. (Patient not taking: Reported on 02/14/2016) 21 tablet 0   . rivaroxaban (XARELTO) 20 MG TABS tablet Take 20 mg by mouth every morning.   07/16/2016 at 0700  . topiramate (TOPAMAX) 50 MG tablet Take 50-100 mg by mouth 2 (two) times daily. 1 tablet in the Am and 2 tablets at night   07/15/2016 at Unknown time  . vitamin B-12 (CYANOCOBALAMIN) 1000 MCG tablet Take 1,000 mcg by mouth every Monday, Wednesday, and Friday.    07/15/2016 at Unknown time   Assessment: 45yo male with complicated medical hx.  Pt was on Xarelto PTA for h/o DVT with last dose reportedly 11/7.  Heparin level is > 2 due to Xarelto Rx.  Asked to initiate Heparin while Xarelto on hold.  Will need to use aPTT to manage heparin until Xarelto effect diminished.  Baseline labs reviewed.    Goal of Therapy:  APTT 62-102 Monitor platelets by anticoagulation protocol: Yes  Plan:  Heparin at 900 units/hr infusion No bolus APTT in 6-8 hrs then daily CBC daily while on Heparin  Valrie HartHall, Candy Leverett A 08/15/2016,8:21 AM

## 2016-08-15 NOTE — Progress Notes (Signed)
Report called to Marzetta BoardJessica Blalock at [facility]

## 2016-08-15 NOTE — H&P (Signed)
History and Physical    Peter [name] ZOX:[id number] DOB: [birth year] DOA: 08/14/2016  PCP: PROVIDER NOT IN SYSTEM   Patient coming from: Home  Chief Complaint: Abdominal pain and distension  HPI: Peter [name] is a 45 y.o. 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 Patient had been in his usual state of health until the insidious development of abdominal distention and pain in the lower quadrants several days ago. Pain is worse with quick movements or oral intake. Symptoms were initially intermittent and mild, but have progressed. He presents today with constant pain in the lower abdomen. There is nausea, but no vomiting and no diarrhea. He describes his symptoms as similar to his prior experiences with pancreatitis.  ED Course: Upon arrival to the ED, patient is found to be afebrile, saturating adequately on room air, mildly tachycardic in the low 100s, and with initial blood pressure of 98/77. Chemistry panel is notable for a serum creatinine of 2.42, up from an apparent baseline 1.6-1.7. CBC is notable for a stable thrombocytopenia with platelets of 120,000. Lactic acid is reassuring at 0.93, urine drug screen is negative, lipase is within normal limits, and urinalysis is notable for an elevated specific gravity. Patient was given 2 L of normal saline with resolution of the tachycardia and improvement in blood pressures which have since remained stable. CT of the abdomen and pelvis was obtained and findings are consistent with a high-grade small bowel obstruction, possibly due to adhesions, and with pneumatosis intestinalis. NG tube was placed to suction and patient was treated with multiple doses of IV Zofran  and fentanyl. Patient remained hemodynamically stable in the ED and in no apparent respiratory distress. Given the patient's complex surgical history and concern that his pain is worsening, ED physician discussed the case with surgery at [facility] and the patient was accepted, but unfortunately there was no bed availability and the surgeon advised admission to [facility] with plan for transfer to [facility] pending bed availability.   Review of Systems:  All other systems reviewed and apart from HPI, are negative.  Past Medical History:  Diagnosis Date  . Bowel obstruction   . DVT (deep venous thrombosis) (HCC)    lower extremity  . Hypertension   . Ileus (HCC) 01/03/2012  . Pancreatitis   . Renal disorder    kidney stones  . Retroperitoneal bleed 01/04/2012    Past Surgical History:  Procedure Laterality Date  . chronic pancreatitiis    . COLON RESECTION    . COLOSTOMY REVERSAL    . HERNIA REPAIR    . LITHOTRIPSY    . Mucous Fistula     2013  . PANCREAS SURGERY     half of pancreas removed  . PANCREATITIC FISTULA    . SBO       reports that he has never smoked. He has never used smokeless tobacco. He reports that he does not drink alcohol or use drugs.  Allergies  Allergen Reactions  . Ace Inhibitors Swelling    Lip swelling  . Coconut Flavor Swelling  . Contrast Media [Iodinated Diagnostic Agents]     Patient is allergic to IV contrast per Dr Rolland Porter.  . Iodine     Patient experienced shortness of breath and chest tightness.  Albuterol and benadryl were required.    History reviewed. No pertinent family history.   Prior to Admission medications   Medication Sig Start Date End Date Taking? Authorizing Provider  amitriptyline (ELAVIL) 150 MG tablet Take 150 mg by mouth at bedtime.  11/30/14   Historical Provider, MD  CREON 36000 UNITS CPEP capsule Take 36,000-108,000 Units by mouth 3 (three) times daily with meals. 1 with snacks and 3 with meals  12/11/14   Historical Provider, MD  folic acid (FOLVITE) 1 MG tablet Take 1 mg by mouth daily. 04/13/15   Historical Provider, MD  hydrochlorothiazide (HYDRODIURIL) 25 MG tablet Take 25 mg by mouth daily.    Historical Provider, MD  HYDROcodone-acetaminophen (NORCO/VICODIN) 5-325 MG tablet Take 1 tablet by mouth every 6 (six) hours as needed for severe pain. 04/24/16   Shaune Pollackameron Isaacs, MD  lisinopril (PRINIVIL,ZESTRIL) 5 MG tablet Take 5 mg by mouth daily.    Historical Provider, MD  magnesium oxide (MAG-OX) 400 (241.3 MG) MG tablet Take 1 tablet by mouth 2 (two) times daily. 01/13/15   Historical Provider, MD  methocarbamol (ROBAXIN) 500 MG tablet Take 1 tablet (500 mg total) by mouth 3 (three) times daily. Patient not taking: Reported on 02/14/2016 01/20/16   Ivery QualeHobson Bryant, PA-C  rivaroxaban (XARELTO) 20 MG TABS tablet Take 20 mg by mouth every morning.    Historical Provider, MD  topiramate (TOPAMAX) 50 MG tablet Take 50-100 mg by mouth 2 (two) times daily. 1 tablet in the Am and 2 tablets at night    Historical Provider, MD  vitamin B-12 (CYANOCOBALAMIN) 1000 MCG tablet Take 1,000 mcg by mouth every Monday, Wednesday, and Friday.     Historical Provider, MD    Physical Exam: Vitals:   08/15/16 0147 08/15/16 0235 08/15/16 0432 08/15/16 0501  BP: 109/82 112/85 125/91 124/80  Pulse: 88 85 88 93  Resp: 18 18 18 16   Temp:      TempSrc:      SpO2: 99% 100% 99% 94%  Weight:      Height:         Constitutional: NAD, calm, in apparent discomfort Eyes: PERTLA, lids and conjunctivae normal ENMT: Mucous membranes are moist. Posterior pharynx clear of any exudate or lesions.   Neck: normal, supple, no masses, no thyromegaly Respiratory: clear to auscultation bilaterally, no wheezing, no crackles. Normal respiratory effort.  use.  Cardiovascular: S1 & S2 heard, regular rate and rhythm. No extremity edema. No significant JVD. Abdomen: Abdomen is mildly distended and exquisitely tender throughout. There is  rebound tenderness and guarding. Occasional high-pitched bowel sound.  Musculoskeletal: no clubbing / cyanosis. No joint deformity upper and lower extremities. Normal muscle tone.  Skin: no significant rashes, lesions, ulcers. Warm, dry, well-perfused. Neurologic: CN 2-12 grossly intact. Sensation intact, DTR normal. Strength 5/5 in all 4 limbs.  Psychiatric: Normal judgment and insight. Alert and oriented x 3. Normal mood and affect.    Labs on Admission: I have personally reviewed following labs and imaging studies  CBC:  Recent Labs Lab 08/15/16 0019  WBC 6.0  NEUTROABS 4.5  HGB 13.3  HCT 40.3  MCV 82.2  PLT 120*   Basic Metabolic Panel:  Recent Labs Lab 08/15/16 0019  NA 132*  K 3.5  CL 101  CO2 22  GLUCOSE 144*  BUN 22*  CREATININE 2.42*  CALCIUM 9.8   GFR: Estimated Creatinine Clearance: 47 mL/min (by C-G formula based on SCr of 2.42 mg/dL (H)). Liver Function Tests:  Recent Labs  Lab 08/15/16 0019  AST 21  ALT 22  ALKPHOS 93  BILITOT 1.0  PROT 8.6*  ALBUMIN 4.7    Recent Labs Lab 08/15/16 0019  LIPASE 19   No results for input(s): AMMONIA in the last 168 hours. Coagulation Profile: No results for input(s): INR, PROTIME in the last 168 hours. Cardiac Enzymes: No results for input(s): CKTOTAL, CKMB, CKMBINDEX, TROPONINI in the last 168 hours. BNP (last 3 results) No results for input(s): PROBNP in the last 8760 hours. HbA1C: No results for input(s): HGBA1C in the last 72 hours. CBG: No results for input(s): GLUCAP in the last 168 hours. Lipid Profile: No results for input(s): CHOL, HDL, LDLCALC, TRIG, CHOLHDL, LDLDIRECT in the last 72 hours. Thyroid Function Tests: No results for input(s): TSH, T4TOTAL, FREET4, T3FREE, THYROIDAB in the last 72 hours. Anemia Panel: No results for input(s): VITAMINB12, FOLATE, FERRITIN, TIBC, IRON, RETICCTPCT in the last 72 hours. Urine analysis:    Component Value Date/Time   COLORURINE YELLOW 08/15/2016  0117   APPEARANCEUR CLEAR 08/15/2016 0117   LABSPEC >1.030 (H) 08/15/2016 0117   PHURINE 5.5 08/15/2016 0117   GLUCOSEU NEGATIVE 08/15/2016 0117   HGBUR LARGE (A) 08/15/2016 0117   BILIRUBINUR NEGATIVE 08/15/2016 0117   KETONESUR TRACE (A) 08/15/2016 0117   PROTEINUR NEGATIVE 08/15/2016 0117   UROBILINOGEN 0.2 07/27/2015 2300   NITRITE NEGATIVE 08/15/2016 0117   LEUKOCYTESUR NEGATIVE 08/15/2016 0117   Sepsis Labs: @LABRCNTIP (procalcitonin:4,lacticidven:4) )No results found for this or any previous visit (from the past 240 hour(s)).   Radiological Exams on Admission: Ct Abdomen Pelvis Wo Contrast  Result Date: 08/15/2016 CLINICAL DATA:  Generalized abdominal pain with nausea and diarrhea. Constipation with prevent blood per rectum. History of retroperitoneal bleed, colostomy reversal, renal disorder, ileus and small bowel obstructions. Pancreatic fistula. EXAM: CT ABDOMEN AND PELVIS WITHOUT CONTRAST TECHNIQUE: Multidetector CT imaging of the abdomen and pelvis was performed following the standard protocol without IV contrast. COMPARISON:  11/28/2015 CT FINDINGS: Lower chest: There is chronic scarring and/or atelectasis in the right lower lobe posteriorly. Minimal dependent atelectasis at the left lung base. The visualized cardiac chambers are stable and within normal limits for size. No pericardial effusion. Calcifications seen at the aortic root. Oral contrast is noted in the distal esophagus possibly from reflux or delayed esophageal emptying. Hepatobiliary: The unenhanced liver is normal. The gallbladder is not distended. No gallstones are seen. There is no biliary dilatation. Pancreas: The unenhanced pancreas is unremarkable. Spleen: There is no splenomegaly. Adrenals/Urinary Tract: Adrenal glands and both kidneys demonstrate no focal abnormality on this unenhanced study. There is no obstructive uropathy or nephrolithiasis. The bladder is slightly thick-walled but this may be due to  underdistention. A cystitis is not entirely excluded but believed less likely. Stomach/Bowel: Moderate gastric distention with oral contrast. There is dilatation of proximal small intestine from second portion of duodenum through much of the jejunum similar to prior study with air-fluid level seen in keeping with high-grade small bowel obstruction. However on the current exam, there is pneumatosis involving distended jejunal loops in left hemi abdomen the largest area of distention approximately 8 cm. No portal venous gas noted. Once again there is protrusion of a short segment of small bowel at the right lower quadrant ostomy site into the ostomy bag. Name adjacent to the exiting loop of small intestine is a small segment of contrast filled large bowel, series 2, image 50. Retained contrast material is again seen in distal colon however the large intestine is decompressed.  No free intraperitoneal air. Vascular/Lymphatic: IVC filter in place.  No lymphadenopathy. Reproductive: Unremarkable Other: Mild congestion of the central mesentery with small mesenteric lymph nodes noted. Musculoskeletal: No acute osseous abnormality. Disc space narrowing at L5-S1 with mild posterior osteophyte off the inferior endplate of L5. IMPRESSION: Abnormal fluid-filled dilated small bowel loops predominantly involving jejunum with findings keeping with high-grade small-bowel or partial small bowel obstruction complicated by pneumatosis on current exam. Findings are may be related an adhesion. No portal venous air is identified. Right lower quadrant ileostomy with protrusion of small bowel as before and and what appears to be a small parastomal hernia containing a small segment of contrast filled decompressed large bowel. Critical Value/emergent results were called by telephone at the time of interpretation on 08/15/2016 at 4:02 am to Dr. Elesa Massed , who verbally acknowledged these results. Electronically Signed   By: Tollie Eth M.D.   On:  08/15/2016 04:05   Dg Chest Portable 1 View  Result Date: 08/15/2016 CLINICAL DATA:  NG placement EXAM: PORTABLE CHEST 1 VIEW COMPARISON:  Chest 08/18/2015.  CT abdomen and pelvis 08/15/2016 FINDINGS: Enteric tube is somewhat coiled in the chest with tip is demonstrated in the inferior aspect of the mediastinum towards the midline. This is probably with a distended distal esophagus. Previous CT abdomen and pelvis from 08/15/2016 does not demonstrate esophageal dilatation or hiatal hernia. Shallow inspiration with linear atelectasis in the lung bases. No focal consolidation. No blunting of costophrenic angles. No pneumothorax. Normal heart size and pulmonary vascularity. IMPRESSION: Enteric tube tip is in the inferior aspect of the mediastinum, likely within the distal esophagus. Possible esophageal dilatation. Electronically Signed   By: Burman Nieves M.D.   On: 08/15/2016 04:56    EKG: Not performed, will obtain as appropriate.   Assessment/Plan  1. High-grade SBO with pneumatosis intestinalis  - CT abdomen findings consistent with recurrent high-grade SBO, possibly d/t adhesions, with pneumatosis  - There is no fever or leukocytosis; lactic acid is reassuring at 0.93   - Patient has a complicated surgical history and there is concern that his pain is worsening in the ED; he would likely be best served at the tertiary institution where his primary surgeon is; Dr. Wilkie Aye (EDP) discussed the case with surgery at [facility] and they agree to accept the patient, but have no bed available currently and recommend admitting the patient here to [facility] until a bed becomes available  - Continue conservative management with NGT, NPO status, pain-control, IVF   - Serial abdominal exams  2. Recurrent DVT on Xarelto  - Pt has been on Xarelto for recurrent DVT  - There is no suggestion of acute PE or DVT; no bleeding  - Will place on heparin infusion while NPO   3. Hypertension  - BP was soft initially,  but has normalized with IVF and has remained stable so far  - He is managed with HCTZ and lisinopril at home; these are held on admission    4. AKI superimposed on CKD stage III  - SCr is 2.42 on admission, up from an apparent baseline of 1.6-1.7  - Likely a prerenal azotemia in setting reduced oral intake; soft BP on presentation  - Anticipate improvement with fluid-resuscitation; his lisinopril and HCTZ are held   5. Thrombocytopenia  - Platelets 120,000 on admission, similar to priors - Continue folate supplementation - Monitor while on heparin    DVT prophylaxis: Xarelto PTA, now heparin infusion Code Status: Full  Family Communication: Wife  updated at bedside Disposition Plan: Admit to telemetry  Cons[id number] Admission status: Inpatient    Peter Deutscherimothy S Latera Mclin, MD [facility] Pager [phone]  If 7PM-7AM, please contact night-coverage [email or web address] Password [email or web address]  08/15/2016, 5:37 AM

## 2016-08-15 NOTE — ED Notes (Addendum)
Please call wife per pt [facility]  [place] [phone]

## 2016-08-16 LAB — URINE CULTURE: Culture: NO GROWTH

## 2016-08-20 LAB — CULTURE, BLOOD (ROUTINE X 2)
CULTURE: NO GROWTH
Culture: NO GROWTH

## 2016-11-07 ENCOUNTER — Emergency Department (HOSPITAL_COMMUNITY)
Admission: EM | Admit: 2016-11-07 | Discharge: 2016-11-07 | Disposition: A | Discharge status: Home / Self Care | Payer: Medicare Other | Attending: Emergency Medicine | Admitting: Emergency Medicine

## 2016-11-07 ENCOUNTER — Emergency Department (HOSPITAL_COMMUNITY): Payer: Medicare Other

## 2016-11-07 ENCOUNTER — Encounter (HOSPITAL_COMMUNITY): Payer: Self-pay | Admitting: Emergency Medicine

## 2016-11-07 DIAGNOSIS — R0789 Other chest pain: Secondary | ICD-10-CM | POA: Diagnosis not present

## 2016-11-07 DIAGNOSIS — N183 Chronic kidney disease, stage 3 (moderate): Secondary | ICD-10-CM | POA: Insufficient documentation

## 2016-11-07 DIAGNOSIS — R109 Unspecified abdominal pain: Secondary | ICD-10-CM | POA: Diagnosis not present

## 2016-11-07 DIAGNOSIS — M546 Pain in thoracic spine: Secondary | ICD-10-CM | POA: Diagnosis present

## 2016-11-07 DIAGNOSIS — I129 Hypertensive chronic kidney disease with stage 1 through stage 4 chronic kidney disease, or unspecified chronic kidney disease: Secondary | ICD-10-CM | POA: Insufficient documentation

## 2016-11-07 DIAGNOSIS — Z79899 Other long term (current) drug therapy: Secondary | ICD-10-CM | POA: Insufficient documentation

## 2016-11-07 HISTORY — DX: Other chronic pain: G89.29

## 2016-11-07 HISTORY — DX: Chronic kidney disease, unspecified: N18.9

## 2016-11-07 HISTORY — DX: Calculus of kidney: N20.0

## 2016-11-07 HISTORY — DX: Migraine, unspecified, not intractable, without status migrainosus: G43.909

## 2016-11-07 HISTORY — DX: Unspecified abdominal pain: R10.9

## 2016-11-07 HISTORY — DX: Other pancytopenia: D61.818

## 2016-11-07 HISTORY — DX: Pain, unspecified: R52

## 2016-11-07 LAB — COMPREHENSIVE METABOLIC PANEL
ALK PHOS: 87 U/L (ref 38–126)
ALT: 18 U/L (ref 17–63)
AST: 19 U/L (ref 15–41)
Albumin: 4.3 g/dL (ref 3.5–5.0)
Anion gap: 6 (ref 5–15)
BUN: 12 mg/dL (ref 6–20)
CALCIUM: 9.6 mg/dL (ref 8.9–10.3)
CHLORIDE: 102 mmol/L (ref 101–111)
CO2: 27 mmol/L (ref 22–32)
CREATININE: 1.76 mg/dL — AB (ref 0.61–1.24)
GFR calc Af Amer: 51 mL/min — ABNORMAL LOW (ref >60)
GFR calc non Af Amer: 44 mL/min — ABNORMAL LOW (ref >60)
Glucose, Bld: 98 mg/dL (ref 65–99)
Potassium: 4.2 mmol/L (ref 3.5–5.1)
SODIUM: 135 mmol/L (ref 135–145)
Total Bilirubin: 0.5 mg/dL (ref 0.3–1.2)
Total Protein: 8 g/dL (ref 6.5–8.1)

## 2016-11-07 LAB — CBC WITH DIFFERENTIAL/PLATELET
Basophils Absolute: 0 10*3/uL (ref 0.0–0.1)
Basophils Relative: 0 %
EOS ABS: 0.1 10*3/uL (ref 0.0–0.7)
EOS PCT: 2 %
HCT: 35.5 % — ABNORMAL LOW (ref 39.0–52.0)
Hemoglobin: 11.4 g/dL — ABNORMAL LOW (ref 13.0–17.0)
LYMPHS PCT: 38 %
Lymphs Abs: 1.1 10*3/uL (ref 0.7–4.0)
MCH: 25.5 pg — AB (ref 26.0–34.0)
MCHC: 32.1 g/dL (ref 30.0–36.0)
MCV: 79.4 fL (ref 78.0–100.0)
Monocytes Absolute: 0.2 10*3/uL (ref 0.1–1.0)
Monocytes Relative: 8 %
Neutro Abs: 1.5 10*3/uL — ABNORMAL LOW (ref 1.7–7.7)
Neutrophils Relative %: 52 %
PLATELETS: 146 10*3/uL — AB (ref 150–400)
RBC: 4.47 MIL/uL (ref 4.22–5.81)
RDW: 16.8 % — ABNORMAL HIGH (ref 11.5–15.5)
WBC: 2.9 10*3/uL — AB (ref 4.0–10.5)

## 2016-11-07 LAB — TROPONIN I

## 2016-11-07 LAB — LIPASE, BLOOD: Lipase: 20 U/L (ref 11–51)

## 2016-11-07 MED ORDER — METHOCARBAMOL 500 MG PO TABS: 1000.0000 mg | ORAL_TABLET | Freq: Four times a day (QID) | ORAL | 0 refills | Status: DC | PRN

## 2016-11-07 MED ORDER — OXYCODONE-ACETAMINOPHEN 5-325 MG PO TABS: ORAL_TABLET | ORAL | 0 refills | Status: DC

## 2016-11-07 MED ORDER — MORPHINE SULFATE (PF) 4 MG/ML IV SOLN
4.0000 mg | INTRAVENOUS | Status: AC | PRN
  Administered 2016-11-07 (×2): 4 mg via INTRAVENOUS
  Filled 2016-11-07 (×2): qty 1

## 2016-11-07 NOTE — ED Notes (Signed)
EKG given to Dr. McManus.  

## 2016-11-07 NOTE — ED Triage Notes (Signed)
C/o chest pain since yesterday, rating pain 9/10.  C/o pain to left abdominal area with SOB.  Denies any other discomfort.

## 2016-11-07 NOTE — ED Provider Notes (Signed)
[facility] DEPT Provider Note   CSN: [id number] Arrival date & time: 11/07/16  1332     History   Chief Complaint Chief Complaint  Patient presents with  . Chest Pain    HPI Peter [name] is a 46 y.o. male.   Chest Pain      Pt was seen at 1350. Per pt and his wife, c/o gradual onset and persistence of constant left sided upper chest "pain" for the past 2 days. Pt states the pain began after he had bilateral celiac plexus block at [facility]. Endorses compliance with xarelto. Pt also c/o abd pain, but endorses hx chronic abd pain. Also endorses hx of enterocutaneous fistula, "occasionally drains mucus." Denies any change in his fistula drainage. Denies palpitations, no cough, no N/V/D, no focal motor weakness, no tingling/numbness in extremities, no fevers, no rash.    Past Medical History:  Diagnosis Date  . Bowel obstruction   . Chronic abdominal pain   . CKD (chronic kidney disease)   . DVT (deep venous thrombosis) (HCC)    lower extremity  . Hypertension   . Ileus (HCC) 01/03/2012  . Kidney stones   . Migraine headache   . Pain management   . Pancreatitis   . Pancytopenia (HCC)   . Retroperitoneal bleed 01/04/2012    Patient Active Problem List   Diagnosis Date Noted  . SBO (small bowel obstruction) 08/15/2016  . AKI (acute kidney injury) (HCC) 08/15/2016  . CKD (chronic kidney disease), stage III 08/15/2016  . History of DVT (deep vein thrombosis) 08/15/2016  . Thrombocytopenia (HCC) 08/15/2016  . Generalized abdominal pain   . Small bowel obstruction   . Pneumatosis intestinalis   . Hyperglycemia 01/05/2012  . Peripheral edema 01/04/2012  . Ileus (HCC) 01/03/2012  . Anemia 01/03/2012  . Metabolic acidosis 12/30/2011  . Hypocalcemia 12/30/2011  . Hypertriglyceridemia 12/30/2011  . Chronic pancreatitis (HCC) 12/29/2011  . Hepatic steatosis 12/29/2011  . HTN (hypertension) 10/15/2011  . Alcohol abuse 10/15/2011  . Obesity 10/15/2011    Past  Surgical History:  Procedure Laterality Date  . chronic pancreatitiis    . COLON RESECTION    . COLOSTOMY REVERSAL    . HERNIA REPAIR    . IVC FILTER PLACEMENT ([facility] HX)    . LITHOTRIPSY    . Mucous Fistula     2013  . PANCREAS SURGERY     half of pancreas removed  . PANCREATITIC FISTULA    . SBO         Home Medications    Prior to Admission medications   Medication Sig Start Date End Date Taking? Authorizing Provider  amitriptyline (ELAVIL) 150 MG tablet Take 150 mg by mouth at bedtime.  11/30/14  Yes Historical Provider, MD  Cholecalciferol (VITAMIN D PO) Take 1 tablet by mouth daily.   Yes Historical Provider, MD  CREON 36000 UNITS CPEP capsule Take 36,000-108,000 Units by mouth 3 (three) times daily with meals. 1 with snacks and 3 with meals 12/11/14  Yes Historical Provider, MD  docusate sodium (COLACE) 100 MG capsule Take 100 mg by mouth daily as needed. For constipation 12/19/14  Yes Historical Provider, MD  ferrous sulfate 325 (65 FE) MG tablet Take 325 mg by mouth daily.   Yes Historical Provider, MD  folic acid (FOLVITE) 1 MG tablet Take 1 mg by mouth daily. 04/13/15  Yes Historical Provider, MD  gabapentin (NEURONTIN) 300 MG capsule Take 300 mg by mouth at bedtime. 10/10/16  Yes Historical Provider, MD  HYDROcodone-acetaminophen (NORCO/VICODIN) 5-325 MG tablet Take 1 tablet by mouth every 6 (six) hours as needed for severe pain. 04/24/16  Yes Shaune Pollack, MD  magnesium oxide (MAG-OX) 400 (241.3 MG) MG tablet Take 1 tablet by mouth 2 (two) times daily. 01/13/15  Yes Historical Provider, MD  Multiple Vitamin (MULTIVITAMIN WITH MINERALS) TABS tablet Take 1 tablet by mouth daily.   Yes Historical Provider, MD  oxyCODONE (OXY IR/ROXICODONE) 5 MG immediate release tablet Take 3 mg by mouth 2 (two) times daily as needed. 10/10/16  Yes Historical Provider, MD  rivaroxaban (XARELTO) 10 MG TABS tablet Take 10 mg by mouth every morning. 10/30/16  Yes Historical Provider, MD  sildenafil  (REVATIO) 20 MG tablet Take 20 mg by mouth as needed (1 hour prior to sexual activity).  08/10/16  Yes Historical Provider, MD  topiramate (TOPAMAX) 50 MG tablet Take 50-100 mg by mouth 2 (two) times daily. 1 tablet in the Am and 2 tablets at night   Yes Historical Provider, MD  vitamin B-12 (CYANOCOBALAMIN) 1000 MCG tablet Take 1,000 mcg by mouth every Monday, Wednesday, and Friday.    Yes Historical Provider, MD  heparin 100-0.45 UNIT/ML-% infusion Inject 1,100 Units/hr into the vein continuous. Patient not taking: Reported on 11/07/2016 08/15/16   Erick Blinks, MD    Family History Family History  Problem Relation Age of Onset  . Colon cancer Neg Hx     Social History Social History  Substance Use Topics  . Smoking status: Never Smoker  . Smokeless tobacco: Never Used  . Alcohol use No     Comment: denies today 07/21/2015     Allergies   Ace inhibitors; Coconut flavor; Contrast media [iodinated diagnostic agents]; and Iodine   Review of Systems Review of Systems  Cardiovascular: Positive for chest pain.  ROS: Statement: All systems negative except as marked or noted in the HPI; Constitutional: Negative for fever and chills. ; ; Eyes: Negative for eye pain, redness and discharge. ; ; ENMT: Negative for ear pain, hoarseness, nasal congestion, sinus pressure and sore throat. ; ; Cardiovascular: +CP. Negative for palpitations, diaphoresis, dyspnea and peripheral edema. ; ; Respiratory: Negative for cough, wheezing and stridor. ; ; Gastrointestinal: +abd pain. Negative for nausea, vomiting, diarrhea, blood in stool, hematemesis, jaundice and rectal bleeding. . ; ; Genitourinary: Negative for dysuria, flank pain and hematuria. ; ; Musculoskeletal: Negative for back and neck pain. Negative for swelling and trauma.; ; Skin: Negative for pruritus, rash, abrasions, blisters, bruising and skin lesion.; ; Neuro: Negative for headache, lightheadedness and neck stiffness. Negative for weakness,  altered level of consciousness, altered mental status, extremity weakness, paresthesias, involuntary movement, seizure and syncope.        Physical Exam Updated Vital Signs BP 135/93 (BP Location: Left Arm)   Pulse 113   Temp 98.1 F (36.7 C) (Oral)   Resp 18   Ht 5\' 10"  (1.778 m)   Wt 251 lb (113.9 kg)   SpO2 96%   BMI 36.01 kg/m    BP 124/93   Pulse 108   Temp 98.1 F (36.7 C) (Oral)   Resp 21   Ht 5\' 10"  (1.778 m)   Wt 251 lb (113.9 kg)   SpO2 97%   BMI 36.01 kg/m    Physical Exam 1355: Physical examination:  Nursing notes reviewed; Vital signs and O2 SAT reviewed;  Constitutional: Well developed, Well nourished, Well hydrated, In no acute distress; Head:  Normocephalic, atraumatic; Eyes: EOMI, PERRL, No scleral icterus; ENMT:  Mouth and pharynx normal, Mucous membranes moist; Neck: Supple, Full range of motion, No lymphadenopathy; Cardiovascular: Tachycardic rate and rhythm, No gallop; Respiratory: Breath sounds clear & equal bilaterally, No wheezes.  Speaking full sentences with ease, Normal respiratory effort/excursion; Chest: +left upper chest wall tender to palp. No soft tissue crepitus. No deformity. Movement normal; Abdomen: Soft, +diffuse tenderness to palp. Nondistended, Normal bowel sounds; Genitourinary: No CVA tenderness; Spine:  No midline CS, TS, LS tenderness..;; Extremities: Pulses normal, No tenderness, No edema, No calf edema or asymmetry.; Neuro: AA&Ox3, Major CN grossly intact.  Speech clear. No gross focal motor or sensory deficits in extremities.; Skin: Color normal, Warm, Dry.   ED Treatments / Results  Labs (all labs ordered are listed, but only abnormal results are displayed)   EKG  EKG Interpretation  Date/Time:  Wednesday November 07 2016 13:35:28 EST Ventricular Rate:  115 PR Interval:  146 QRS Duration: 100 QT Interval:  336 QTC Calculation: 464 R Axis:   37 Text Interpretation:  Sinus tachycardia Cannot rule out Anterior infarct , age  undetermined When compared with ECG of 12/30/2011 No significant change was found When compared with ECG of 11/21/2012 Nonspecific T wave abnormality is no longer Present Reconfirmed by [facility]  MD, Nicholos Johns [id number]) on 11/07/2016 2:38:39 PM       Radiology   Procedures Procedures (including critical care time)  Medications Ordered in ED Medications  morphine 4 MG/ML injection 4 mg (not administered)     Initial Impression / Assessment and Plan / ED Course  I have reviewed the triage vital signs and the nursing notes.  Pertinent labs & imaging results that were available during my care of the patient were reviewed by me and considered in my medical decision making (see chart for details).  MDM Reviewed: previous chart, nursing note and vitals Reviewed previous: labs and ECG Interpretation: labs, ECG, x-ray and CT scan   Results for orders placed or performed during the hospital encounter of 11/07/16  Troponin I  Result Value Ref Range   Troponin I <0.03 <0.03 ng/mL  Comprehensive metabolic panel  Result Value Ref Range   Sodium 135 135 - 145 mmol/L   Potassium 4.2 3.5 - 5.1 mmol/L   Chloride 102 101 - 111 mmol/L   CO2 27 22 - 32 mmol/L   Glucose, Bld 98 65 - 99 mg/dL   BUN 12 6 - 20 mg/dL   Creatinine, Ser 6.21 (H) 0.61 - 1.24 mg/dL   Calcium 9.6 8.9 - 30.8 mg/dL   Total Protein 8.0 6.5 - 8.1 g/dL   Albumin 4.3 3.5 - 5.0 g/dL   AST 19 15 - 41 U/L   ALT 18 17 - 63 U/L   Alkaline Phosphatase 87 38 - 126 U/L   Total Bilirubin 0.5 0.3 - 1.2 mg/dL   GFR calc non Af Amer 44 (L) >60 mL/min   GFR calc Af Amer 51 (L) >60 mL/min   Anion gap 6 5 - 15  Lipase, blood  Result Value Ref Range   Lipase 20 11 - 51 U/L  CBC with Differential  Result Value Ref Range   WBC 2.9 (L) 4.0 - 10.5 K/uL   RBC 4.47 4.22 - 5.81 MIL/uL   Hemoglobin 11.4 (L) 13.0 - 17.0 g/dL   HCT 65.7 (L) 84.6 - 96.2 %   MCV 79.4 78.0 - 100.0 fL   MCH 25.5 (L) 26.0 - 34.0 pg   MCHC 32.1 30.0 - 36.0 g/dL  RDW 16.8 (H) 11.5 - 15.5 %   Platelets 146 (L) 150 - 400 K/uL   Neutrophils Relative % 52 %   Neutro Abs 1.5 (L) 1.7 - 7.7 K/uL   Lymphocytes Relative 38 %   Lymphs Abs 1.1 0.7 - 4.0 K/uL   Monocytes Relative 8 %   Monocytes Absolute 0.2 0.1 - 1.0 K/uL   Eosinophils Relative 2 %   Eosinophils Absolute 0.1 0.0 - 0.7 K/uL   Basophils Relative 0 %   Basophils Absolute 0.0 0.0 - 0.1 K/uL    Ct Abdomen Pelvis Wo Contrast Result Date: 11/07/2016 CLINICAL DATA:  Acute chest pain, left abdominal pain we shortness of breath. EXAM: CT CHEST, ABDOMEN AND PELVIS WITHOUT CONTRAST TECHNIQUE: Multidetector CT imaging of the chest, abdomen and pelvis was performed following the standard protocol without IV contrast. COMPARISON:  Prior CT from 08/15/2016. FINDINGS: CT CHEST FINDINGS Cardiovascular: Intrathoracic aorta of normal caliber. Partially visualized great vessels grossly unremarkable. No mediastinal hematoma. Heart size normal. Calcifications present about the aortic valve. No pericardial effusion. Mediastinum/Nodes: Thyroid normal. No pathologically enlarged mediastinal, hilar, or axillary lymph nodes. Esophagus within normal limits. Lungs/Pleura: Atelectatic changes present dependently within the lower lobes bilaterally. Visualized lungs are otherwise clear. No pulmonary edema or pleural effusion. No pneumothorax. No focal infiltrates. No worrisome pulmonary nodule or mass. Musculoskeletal: No acute osseous abnormality. No worrisome lytic or blastic osseous lesions. CT ABDOMEN PELVIS FINDINGS Hepatobiliary: Limited noncontrast evaluation of the liver is unremarkable. Gallbladder within normal limits. No biliary dilatation. Pancreas: Pancreas not well visualized, likely related to partial debridement given history of severe pancreatitis. Spleen: Spleen within normal limits. Adrenals/Urinary Tract: Adrenal glands within normal limits. Kidneys equal in size without evidence for nephrolithiasis or  hydronephrosis. No hydroureter. Bladder within normal limits. Stomach/Bowel: Stomach mildly distended with oral contrast material within the gastric lumen. Small bowel within normal limits for caliber without evidence for obstruction. Right lower quadrant ostomy again seen. There is increase soft tissue stranding with small amount of free fluid within the right lower quadrant as compared to prior, suspected to be postoperative in nature. No loculated collection identified on this noncontrast examination. Superimposed infection not entirely excluded. No free intraperitoneal air. Vascular/Lymphatic: Mild plaque within the infrarenal aorta. IVC filter in place. Few prominent varices noted within the upper abdomen. No pathologically enlarged intra-abdominopelvic lymph nodes. Few mildly prominent subcentimeter mesenteric lymph nodes are similar to previous. Reproductive: Prostate within normal limits. Musculoskeletal: No acute osseous abnormality. No worrisome lytic or blastic osseous lesions. Degenerative spondylolysis noted at L5-S1. Increased soft tissue stranding along the midline incision at the anterior abdomen, likely postoperative. IMPRESSION: 1. Interval resolution of previously seen small bowel obstruction. Mildly increased soft tissue stranding with small amount of free fluid within the right lower quadrant, suspected to be postoperative in nature given the patient's history, although superimposed infection/enteritis not entirely exclude. No loculated fluid collection identified. 2. Bibasilar atelectasis/scarring. No other acute abnormality identified within the chest. Electronically Signed   By: Rise Mu M.D.   On: 11/07/2016 17:56   Dg Chest 2 View Result Date: 11/07/2016 CLINICAL DATA:  Chest pain.  Shortness of breath . EXAM: CHEST  2 VIEW COMPARISON:  08/15/2016 . FINDINGS: Previously identified NG tube is been removed. Mediastinum and hilar structures are normal. Lung volumes with mild  basilar atelectasis. Mild left base infiltrate cannot be excluded. No pleural effusion or pneumothorax. Heart size stable. No acute bony abnormality. IMPRESSION: Mild mild bibasilar subsegmental atelectasis. Mild left base infiltrate  cannot be excluded . Electronically Signed   By: Maisie Fushomas  Register   On: 11/07/2016 14:25   Ct Chest Wo Contrast Result Date: 11/07/2016 CLINICAL DATA:  Acute chest pain, left abdominal pain we shortness of breath. EXAM: CT CHEST, ABDOMEN AND PELVIS WITHOUT CONTRAST TECHNIQUE: Multidetector CT imaging of the chest, abdomen and pelvis was performed following the standard protocol without IV contrast. COMPARISON:  Prior CT from 08/15/2016. FINDINGS: CT CHEST FINDINGS Cardiovascular: Intrathoracic aorta of normal caliber. Partially visualized great vessels grossly unremarkable. No mediastinal hematoma. Heart size normal. Calcifications present about the aortic valve. No pericardial effusion. Mediastinum/Nodes: Thyroid normal. No pathologically enlarged mediastinal, hilar, or axillary lymph nodes. Esophagus within normal limits. Lungs/Pleura: Atelectatic changes present dependently within the lower lobes bilaterally. Visualized lungs are otherwise clear. No pulmonary edema or pleural effusion. No pneumothorax. No focal infiltrates. No worrisome pulmonary nodule or mass. Musculoskeletal: No acute osseous abnormality. No worrisome lytic or blastic osseous lesions. CT ABDOMEN PELVIS FINDINGS Hepatobiliary: Limited noncontrast evaluation of the liver is unremarkable. Gallbladder within normal limits. No biliary dilatation. Pancreas: Pancreas not well visualized, likely related to partial debridement given history of severe pancreatitis. Spleen: Spleen within normal limits. Adrenals/Urinary Tract: Adrenal glands within normal limits. Kidneys equal in size without evidence for nephrolithiasis or hydronephrosis. No hydroureter. Bladder within normal limits. Stomach/Bowel: Stomach mildly  distended with oral contrast material within the gastric lumen. Small bowel within normal limits for caliber without evidence for obstruction. Right lower quadrant ostomy again seen. There is increase soft tissue stranding with small amount of free fluid within the right lower quadrant as compared to prior, suspected to be postoperative in nature. No loculated collection identified on this noncontrast examination. Superimposed infection not entirely excluded. No free intraperitoneal air. Vascular/Lymphatic: Mild plaque within the infrarenal aorta. IVC filter in place. Few prominent varices noted within the upper abdomen. No pathologically enlarged intra-abdominopelvic lymph nodes. Few mildly prominent subcentimeter mesenteric lymph nodes are similar to previous. Reproductive: Prostate within normal limits. Musculoskeletal: No acute osseous abnormality. No worrisome lytic or blastic osseous lesions. Degenerative spondylolysis noted at L5-S1. Increased soft tissue stranding along the midline incision at the anterior abdomen, likely postoperative. IMPRESSION: 1. Interval resolution of previously seen small bowel obstruction. Mildly increased soft tissue stranding with small amount of free fluid within the right lower quadrant, suspected to be postoperative in nature given the patient's history, although superimposed infection/enteritis not entirely exclude. No loculated fluid collection identified. 2. Bibasilar atelectasis/scarring. No other acute abnormality identified within the chest. Electronically Signed   By: Rise MuBenjamin  McClintock M.D.   On: 11/07/2016 17:56    1925:   CBC and BUN/Cr appear baseline. Pt remains afebrile with stable VSS. Pt endorses hx chronic abd pain and "mucus" drainage from fistula, denies change in baseline. T/C to General Surgery Dr. Earlene Plateravis, case discussed, including:  HPI, pertinent PM/SHx, VS/PE, dx testing, ED course and treatment: he has viewed the CT images and Care Everywhere records,  CT scan does have post-op changes and very tiny/minimal amount of FF, no concerning findings on CT scan to indicate admission at this time. Doubt PE as cause for symptoms with low risk Wells and pt endorses compliance with xarelto.  Doubt ACS as cause for symptoms with normal troponin and unchanged EKG from previous after 2 days of constant symptoms. Pt now states 2 days ago during his procedure:  "I remembered they positioned me on the table face down with my chin off the end of the table and the end  of the table poking into my left upper chest where it hurts now." Likely cause for pt's ongoing pain. Tx symptomatically, f/u Pain Management doctor.  Pt wants to go home now. Dx and testing d/w pt and family.  Questions answered.  Verb understanding, agreeable to d/c home with outpt f/u.    Final Clinical Impressions(s) / ED Diagnoses   Final diagnoses:  None    New Prescriptions New Prescriptions   No medications on file      [name], DO 11/10/16 1343

## 2016-11-07 NOTE — ED Notes (Signed)
Pt transported to Xray. 

## 2016-11-07 NOTE — ED Notes (Signed)
Patient states he does not need anything at this time. 

## 2016-11-07 NOTE — Discharge Instructions (Signed)
Take the prescriptions as directed.  Apply moist heat or ice to the area(s) of discomfort, for 15 minutes at a time, several times per day for the next few days.  Do not fall asleep on a heating or ice pack.  Call your regular Pain Management doctor tomorrow to schedule a follow up appointment this week.  Return to the Emergency Department immediately if worsening.

## 2017-01-04 ENCOUNTER — Emergency Department (HOSPITAL_COMMUNITY)
Admission: EM | Admit: 2017-01-04 | Discharge: 2017-01-04 | Disposition: A | Discharge status: Home / Self Care | Payer: Medicare Other | Attending: Emergency Medicine | Admitting: Emergency Medicine

## 2017-01-04 ENCOUNTER — Encounter (HOSPITAL_COMMUNITY): Payer: Self-pay | Admitting: Emergency Medicine

## 2017-01-04 DIAGNOSIS — I129 Hypertensive chronic kidney disease with stage 1 through stage 4 chronic kidney disease, or unspecified chronic kidney disease: Secondary | ICD-10-CM | POA: Insufficient documentation

## 2017-01-04 DIAGNOSIS — J329 Chronic sinusitis, unspecified: Secondary | ICD-10-CM | POA: Diagnosis not present

## 2017-01-04 DIAGNOSIS — N183 Chronic kidney disease, stage 3 (moderate): Secondary | ICD-10-CM | POA: Diagnosis not present

## 2017-01-04 DIAGNOSIS — J029 Acute pharyngitis, unspecified: Secondary | ICD-10-CM | POA: Diagnosis present

## 2017-01-04 DIAGNOSIS — J028 Acute pharyngitis due to other specified organisms: Secondary | ICD-10-CM

## 2017-01-04 LAB — RAPID STREP SCREEN (MED CTR MEBANE ONLY): Streptococcus, Group A Screen (Direct): NEGATIVE

## 2017-01-04 MED ORDER — AMOXICILLIN 250 MG PO CAPS
500.0000 mg | ORAL_CAPSULE | Freq: Once | ORAL | Status: AC
  Administered 2017-01-04: 500 mg via ORAL
  Filled 2017-01-04: qty 2

## 2017-01-04 MED ORDER — PSEUDOEPHEDRINE HCL 60 MG PO TABS
60.0000 mg | ORAL_TABLET | Freq: Once | ORAL | Status: AC
  Administered 2017-01-04: 60 mg via ORAL
  Filled 2017-01-04: qty 1

## 2017-01-04 MED ORDER — AMOXICILLIN 500 MG PO CAPS: 500.0000 mg | ORAL_CAPSULE | Freq: Three times a day (TID) | ORAL | 0 refills | Status: DC

## 2017-01-04 MED ORDER — ACETAMINOPHEN 500 MG PO TABS
ORAL_TABLET | ORAL | Status: AC
  Administered 2017-01-04: 1000 mg
  Filled 2017-01-04: qty 2

## 2017-01-04 MED ORDER — HYDROCOD POLST-CPM POLST ER 10-8 MG/5ML PO SUER
5.0000 mL | Freq: Once | ORAL | Status: AC
  Administered 2017-01-04: 5 mL via ORAL
  Filled 2017-01-04: qty 5

## 2017-01-04 MED ORDER — HYDROCODONE-HOMATROPINE 5-1.5 MG/5ML PO SYRP: 5.0000 mL | ORAL_SOLUTION | Freq: Four times a day (QID) | ORAL | 0 refills | Status: DC | PRN

## 2017-01-04 NOTE — ED Provider Notes (Signed)
[facility] DEPT Provider Note   CSN: [id number] Arrival date & time: 01/04/17  0935     History   Chief Complaint Chief Complaint  Patient presents with  . Sore Throat    HPI Peter [name] is a 46 y.o. male.  The history is provided by the patient.  Sore Throat  This is a new problem. The problem occurs hourly. The problem has been gradually worsening. Associated symptoms include headaches. Associated symptoms comments: Fever and cough. The symptoms are aggravated by swallowing. Nothing relieves the symptoms. He has tried acetaminophen for the symptoms. The treatment provided no relief.    Past Medical History:  Diagnosis Date  . Bowel obstruction   . Chronic abdominal pain   . CKD (chronic kidney disease)   . DVT (deep venous thrombosis) (HCC)    lower extremity  . Hypertension   . Ileus (HCC) 01/03/2012  . Kidney stones   . Migraine headache   . Pain management   . Pancreatitis   . Pancytopenia (HCC)   . Retroperitoneal bleed 01/04/2012    Patient Active Problem List   Diagnosis Date Noted  . SBO (small bowel obstruction) 08/15/2016  . AKI (acute kidney injury) (HCC) 08/15/2016  . CKD (chronic kidney disease), stage III 08/15/2016  . History of DVT (deep vein thrombosis) 08/15/2016  . Thrombocytopenia (HCC) 08/15/2016  . Generalized abdominal pain   . Small bowel obstruction   . Pneumatosis intestinalis   . Hyperglycemia 01/05/2012  . Peripheral edema 01/04/2012  . Ileus (HCC) 01/03/2012  . Anemia 01/03/2012  . Metabolic acidosis 12/30/2011  . Hypocalcemia 12/30/2011  . Hypertriglyceridemia 12/30/2011  . Chronic pancreatitis (HCC) 12/29/2011  . Hepatic steatosis 12/29/2011  . HTN (hypertension) 10/15/2011  . Alcohol abuse 10/15/2011  . Obesity 10/15/2011    Past Surgical History:  Procedure Laterality Date  . chronic pancreatitiis    . COLON RESECTION    . COLOSTOMY REVERSAL    . HERNIA REPAIR    . IVC FILTER PLACEMENT ([facility] HX)    .  LITHOTRIPSY    . Mucous Fistula     2013  . PANCREAS SURGERY     half of pancreas removed  . PANCREATITIC FISTULA    . SBO         Home Medications    Prior to Admission medications   Medication Sig Start Date End Date Taking? Authorizing Provider  amitriptyline (ELAVIL) 150 MG tablet Take 150 mg by mouth at bedtime.  11/30/14   Historical Provider, MD  Cholecalciferol (VITAMIN D PO) Take 1 tablet by mouth daily.    Historical Provider, MD  CREON 36000 UNITS CPEP capsule Take 36,000-108,000 Units by mouth 3 (three) times daily with meals. 1 with snacks and 3 with meals 12/11/14   Historical Provider, MD  docusate sodium (COLACE) 100 MG capsule Take 100 mg by mouth daily as needed. For constipation 12/19/14   Historical Provider, MD  ferrous sulfate 325 (65 FE) MG tablet Take 325 mg by mouth daily.    Historical Provider, MD  folic acid (FOLVITE) 1 MG tablet Take 1 mg by mouth daily. 04/13/15   Historical Provider, MD  gabapentin (NEURONTIN) 300 MG capsule Take 300 mg by mouth at bedtime. 10/10/16   Historical Provider, MD  heparin 100-0.45 UNIT/ML-% infusion Inject 1,100 Units/hr into the vein continuous. Patient not taking: Reported on 11/07/2016 08/15/16   Erick Blinks, MD  HYDROcodone-acetaminophen (NORCO/VICODIN) 5-325 MG tablet Take 1 tablet by mouth every 6 (six) hours as  needed for severe pain. 04/24/16   Shaune Pollack, MD  magnesium oxide (MAG-OX) 400 (241.3 MG) MG tablet Take 1 tablet by mouth 2 (two) times daily. 01/13/15   Historical Provider, MD  methocarbamol (ROBAXIN) 500 MG tablet Take 2 tablets (1,000 mg total) by mouth 4 (four) times daily as needed for muscle spasms (muscle spasm/pain). 11/07/16   [name], DO  Multiple Vitamin (MULTIVITAMIN WITH MINERALS) TABS tablet Take 1 tablet by mouth daily.    Historical Provider, MD  oxyCODONE (OXY IR/ROXICODONE) 5 MG immediate release tablet Take 3 mg by mouth 2 (two) times daily as needed. 10/10/16   Historical Provider, MD    oxyCODONE-acetaminophen (PERCOCET/ROXICET) 5-325 MG tablet 1 or 2 tabs PO q6h prn pain 11/07/16   [name], DO  rivaroxaban (XARELTO) 10 MG TABS tablet Take 10 mg by mouth every morning. 10/30/16   Historical Provider, MD  sildenafil (REVATIO) 20 MG tablet Take 20 mg by mouth as needed (1 hour prior to sexual activity).  08/10/16   Historical Provider, MD  topiramate (TOPAMAX) 50 MG tablet Take 50-100 mg by mouth 2 (two) times daily. 1 tablet in the Am and 2 tablets at night    Historical Provider, MD  vitamin B-12 (CYANOCOBALAMIN) 1000 MCG tablet Take 1,000 mcg by mouth every Monday, Wednesday, and Friday.     Historical Provider, MD    Family History Family History  Problem Relation Age of Onset  . Colon cancer Neg Hx     Social History Social History  Substance Use Topics  . Smoking status: Never Smoker  . Smokeless tobacco: Never Used  . Alcohol use No     Comment: denies today 07/21/2015     Allergies   Metrizamide; Ace inhibitors; Coconut flavor; Contrast media [iodinated diagnostic agents]; and Iodine   Review of Systems Review of Systems  Constitutional: Positive for activity change, chills and fever.  HENT: Positive for congestion and sore throat.   Respiratory: Positive for cough.   Gastrointestinal: Negative for diarrhea and vomiting.  Neurological: Positive for headaches.  All other systems reviewed and are negative.    Physical Exam Updated Vital Signs BP 140/82 (BP Location: Right Arm)   Pulse (!) 101   Temp (!) 102.3 F (39.1 C) (Oral)   Resp 20   Ht  (1.778 m)   Wt 113.4 kg   SpO2 99%   BMI 35.87 kg/m   Physical Exam  Constitutional: Vital signs are normal. He appears well-developed and well-nourished. He is active.  HENT:  Head: Normocephalic and atraumatic.  Right Ear: Tympanic membrane, external ear and ear canal normal.  Left Ear: Tympanic membrane, external ear and ear canal normal.  Nose: Nose normal.  Mouth/Throat: Uvula is  midline, oropharynx is clear and moist and mucous membranes are normal.  There is increased redness of the oropharynx. Uvula is enlarged. The airway is patent. There is no increased swelling or redness involving the mastoid area.  Nasal congestion is present.  Eyes: Conjunctivae, EOM and lids are normal. Pupils are equal, round, and reactive to light.  Neck: Trachea normal, normal range of motion and phonation normal. Neck supple. Carotid bruit is not present.  Cardiovascular: Regular rhythm and normal pulses.  Tachycardia present.   There is a tachycardia present of 103/min . No rub or gallop appreciated.  Pulmonary/Chest:  There is symmetrical rise and fall of the chest. Course breath sounds present. No wheezing at this time.   Abdominal: Soft. Normal appearance and bowel  sounds are normal.  Lymphadenopathy:       Head (right side): No submental, no preauricular and no posterior auricular adenopathy present.       Head (left side): No submental, no preauricular and no posterior auricular adenopathy present.    He has no cervical adenopathy.  Neurological: He is alert. He has normal strength. No cranial nerve deficit or sensory deficit. GCS eye subscore is 4. GCS verbal subscore is 5. GCS motor subscore is 6.  Skin: Skin is warm and dry. No rash noted.  Psychiatric: His speech is normal.     ED Treatments / Results  Labs (all labs ordered are listed, but only abnormal results are displayed) Labs Reviewed  RAPID STREP SCREEN (NOT AT [facility])  CULTURE, GROUP A STREP [facility])    EKG  EKG Interpretation None       Radiology No results found.  Procedures Procedures (including critical care time)  Medications Ordered in ED Medications  acetaminophen (TYLENOL) 500 MG tablet (1,000 mg  Given 01/04/17 1128)     Initial Impression / Assessment and Plan / ED Course  I have reviewed the triage vital signs and the nursing notes.  Pertinent labs & imaging results that were available  during my care of the patient were reviewed by me and considered in my medical decision making (see chart for details).     *I have reviewed nursing notes, vital signs, and all appropriate lab and imaging results for this patient.**  Final Clinical Impressions(s) / ED Diagnoses  MDM Temperature elevated. Patient treated with Tylenol.  The strep test is negative. Pulse oximetry is 99% on room air. Within normal limits by my interpretation. Recheck of temperature after Tylenol is 100.7.  I suspect the patient has a pharyngitis, sinusitis, and upper respiratory infection. The patient will be treated with Amoxil, Hycodan, and asked to use Tylenol every 4 hours for fever. I discussed the importance of good hydration. We also discussed the importance of using a mask and good handwashing.    Final diagnoses:  None    New Prescriptions New Prescriptions   No medications on file     [name], PA-C 01/04/17 1233    Donnetta Hutching, MD 01/05/17 1021

## 2017-01-04 NOTE — ED Notes (Signed)
Pt complains of migraine headache which he has taken meds for- and ha is improved Her reports headache and sore throat since Monday with a decision to come today as he feels he cannot wait for his appointment at [facility] on Tuesday

## 2017-01-04 NOTE — ED Notes (Signed)
Awaiting evaluation  

## 2017-01-04 NOTE — ED Notes (Signed)
Patient states he already took tylenol this morning at 0900.

## 2017-01-04 NOTE — ED Notes (Signed)
Pt reports headache is worse- spouse reports that tylenol usually does not help pt- And that he is a complicated patient  HB in to evaluate

## 2017-01-04 NOTE — Discharge Instructions (Signed)
Your temperature is responding to the  Tylenol. Please increase fluids. Please use Tylenol every 4 hours for temperature elevation and mild aching. Coricidin HP may be helpful for your congestion, while keeping your blood pressure under control. Please use Hycodan for cough and congestion. This medication may cause drowsiness, please use with caution.

## 2017-01-04 NOTE — ED Triage Notes (Signed)
Patient complains of sore throat and fever since Monday.

## 2017-01-06 LAB — CULTURE, GROUP A STREP (THRC)

## 2017-02-08 ENCOUNTER — Other Ambulatory Visit: Payer: Self-pay

## 2017-02-08 ENCOUNTER — Inpatient Hospital Stay (HOSPITAL_COMMUNITY)
Admission: EM | Admit: 2017-02-08 | Discharge: 2017-02-10 | DRG: 389 | Disposition: A | Discharge status: Home / Self Care | Payer: Medicare Other | Attending: Internal Medicine | Admitting: Internal Medicine

## 2017-02-08 ENCOUNTER — Encounter (HOSPITAL_COMMUNITY): Payer: Self-pay | Admitting: *Deleted

## 2017-02-08 ENCOUNTER — Emergency Department (HOSPITAL_COMMUNITY): Payer: Medicare Other

## 2017-02-08 DIAGNOSIS — Z7901 Long term (current) use of anticoagulants: Secondary | ICD-10-CM

## 2017-02-08 DIAGNOSIS — K861 Other chronic pancreatitis: Secondary | ICD-10-CM | POA: Diagnosis present

## 2017-02-08 DIAGNOSIS — K56609 Unspecified intestinal obstruction, unspecified as to partial versus complete obstruction: Secondary | ICD-10-CM | POA: Diagnosis not present

## 2017-02-08 DIAGNOSIS — Z87442 Personal history of urinary calculi: Secondary | ICD-10-CM

## 2017-02-08 DIAGNOSIS — Z95828 Presence of other vascular implants and grafts: Secondary | ICD-10-CM

## 2017-02-08 DIAGNOSIS — E860 Dehydration: Secondary | ICD-10-CM | POA: Diagnosis present

## 2017-02-08 DIAGNOSIS — I129 Hypertensive chronic kidney disease with stage 1 through stage 4 chronic kidney disease, or unspecified chronic kidney disease: Secondary | ICD-10-CM | POA: Diagnosis present

## 2017-02-08 DIAGNOSIS — Z79899 Other long term (current) drug therapy: Secondary | ICD-10-CM

## 2017-02-08 DIAGNOSIS — Z86718 Personal history of other venous thrombosis and embolism: Secondary | ICD-10-CM

## 2017-02-08 DIAGNOSIS — K565 Intestinal adhesions [bands], unspecified as to partial versus complete obstruction: Principal | ICD-10-CM | POA: Diagnosis present

## 2017-02-08 DIAGNOSIS — N183 Chronic kidney disease, stage 3 unspecified: Secondary | ICD-10-CM | POA: Diagnosis present

## 2017-02-08 DIAGNOSIS — D696 Thrombocytopenia, unspecified: Secondary | ICD-10-CM | POA: Diagnosis present

## 2017-02-08 DIAGNOSIS — I10 Essential (primary) hypertension: Secondary | ICD-10-CM | POA: Diagnosis present

## 2017-02-08 DIAGNOSIS — Z91018 Allergy to other foods: Secondary | ICD-10-CM

## 2017-02-08 DIAGNOSIS — G8929 Other chronic pain: Secondary | ICD-10-CM | POA: Diagnosis present

## 2017-02-08 DIAGNOSIS — N179 Acute kidney failure, unspecified: Secondary | ICD-10-CM | POA: Diagnosis present

## 2017-02-08 DIAGNOSIS — Z91041 Radiographic dye allergy status: Secondary | ICD-10-CM

## 2017-02-08 DIAGNOSIS — Z933 Colostomy status: Secondary | ICD-10-CM

## 2017-02-08 DIAGNOSIS — Z6835 Body mass index (BMI) 35.0-35.9, adult: Secondary | ICD-10-CM

## 2017-02-08 DIAGNOSIS — K760 Fatty (change of) liver, not elsewhere classified: Secondary | ICD-10-CM | POA: Diagnosis present

## 2017-02-08 DIAGNOSIS — Z888 Allergy status to other drugs, medicaments and biological substances status: Secondary | ICD-10-CM

## 2017-02-08 DIAGNOSIS — E669 Obesity, unspecified: Secondary | ICD-10-CM | POA: Diagnosis present

## 2017-02-08 LAB — CBC
HCT: 35.6 % — ABNORMAL LOW (ref 39.0–52.0)
Hemoglobin: 11.5 g/dL — ABNORMAL LOW (ref 13.0–17.0)
MCH: 25.8 pg — ABNORMAL LOW (ref 26.0–34.0)
MCHC: 32.3 g/dL (ref 30.0–36.0)
MCV: 79.8 fL (ref 78.0–100.0)
PLATELETS: 114 10*3/uL — AB (ref 150–400)
RBC: 4.46 MIL/uL (ref 4.22–5.81)
RDW: 19.2 % — AB (ref 11.5–15.5)
WBC: 3.8 10*3/uL — AB (ref 4.0–10.5)

## 2017-02-08 LAB — URINALYSIS, ROUTINE W REFLEX MICROSCOPIC
Bilirubin Urine: NEGATIVE
Glucose, UA: NEGATIVE mg/dL
Hgb urine dipstick: NEGATIVE
KETONES UR: NEGATIVE mg/dL
LEUKOCYTES UA: NEGATIVE
NITRITE: NEGATIVE
PROTEIN: NEGATIVE mg/dL
Specific Gravity, Urine: 1.018 (ref 1.005–1.030)
pH: 7 (ref 5.0–8.0)

## 2017-02-08 LAB — COMPREHENSIVE METABOLIC PANEL
ALK PHOS: 79 U/L (ref 38–126)
ALT: 30 U/L (ref 17–63)
AST: 29 U/L (ref 15–41)
Albumin: 4.4 g/dL (ref 3.5–5.0)
Anion gap: 8 (ref 5–15)
BILIRUBIN TOTAL: 0.5 mg/dL (ref 0.3–1.2)
BUN: 16 mg/dL (ref 6–20)
CHLORIDE: 107 mmol/L (ref 101–111)
CO2: 20 mmol/L — ABNORMAL LOW (ref 22–32)
CREATININE: 2.05 mg/dL — AB (ref 0.61–1.24)
Calcium: 9.1 mg/dL (ref 8.9–10.3)
GFR, EST AFRICAN AMERICAN: 42 mL/min — AB (ref >60)
GFR, EST NON AFRICAN AMERICAN: 37 mL/min — AB (ref >60)
Glucose, Bld: 91 mg/dL (ref 65–99)
Potassium: 4 mmol/L (ref 3.5–5.1)
Sodium: 135 mmol/L (ref 135–145)
TOTAL PROTEIN: 8.1 g/dL (ref 6.5–8.1)

## 2017-02-08 LAB — LIPASE, BLOOD: LIPASE: 15 U/L (ref 11–51)

## 2017-02-08 MED ORDER — HYDROMORPHONE HCL 1 MG/ML IJ SOLN
1.0000 mg | Freq: Once | INTRAMUSCULAR | Status: AC
  Administered 2017-02-09: 1 mg via INTRAVENOUS
  Filled 2017-02-08: qty 1

## 2017-02-08 MED ORDER — SODIUM CHLORIDE 0.9 % IV BOLUS (SEPSIS)
1000.0000 mL | Freq: Once | INTRAVENOUS | Status: AC
  Administered 2017-02-08: 1000 mL via INTRAVENOUS

## 2017-02-08 MED ORDER — ONDANSETRON HCL 4 MG/2ML IJ SOLN
4.0000 mg | Freq: Once | INTRAMUSCULAR | Status: AC
  Administered 2017-02-08: 4 mg via INTRAVENOUS
  Filled 2017-02-08: qty 2

## 2017-02-08 MED ORDER — HYDROMORPHONE HCL 1 MG/ML IJ SOLN
1.0000 mg | Freq: Once | INTRAMUSCULAR | Status: AC
  Administered 2017-02-08: 1 mg via INTRAVENOUS
  Filled 2017-02-08: qty 1

## 2017-02-08 MED ORDER — ONDANSETRON HCL 4 MG/2ML IJ SOLN
4.0000 mg | Freq: Once | INTRAMUSCULAR | Status: AC
  Administered 2017-02-09: 4 mg via INTRAVENOUS
  Filled 2017-02-08: qty 2

## 2017-02-08 NOTE — ED Notes (Signed)
To CT via stretcher

## 2017-02-08 NOTE — ED Notes (Signed)
Informed of delay and approx wait time 

## 2017-02-08 NOTE — ED Notes (Signed)
To room, ambulatory, heel to toe without stagger or drift

## 2017-02-08 NOTE — ED Notes (Signed)
Pt reports he has an appt with his gastronterologist on 5/14

## 2017-02-08 NOTE — ED Notes (Signed)
Awaiting rad read of CT

## 2017-02-08 NOTE — ED Triage Notes (Signed)
Pt comes in with epigastric pain starting around 1 week ago with worsening today. Pt has hx of pancreatitis. Denies n/v/d. Pt states he is having dizziness at present. NAD noted. Pt was ambulatory to triage with no problems noted.

## 2017-02-08 NOTE — ED Provider Notes (Signed)
[facility] DEPT Provider Note   CSN: [id number] Arrival date & time: 02/08/17  1555     History   Chief Complaint Chief Complaint  Patient presents with  . Abdominal Pain    HPI Peter [name] is a 46 y.o. male.  Patient complains of severe abdominal pain today. No vomiting. Patient has a history of small bowel obstructions numerous times   The history is provided by the patient. No language interpreter was used.  Abdominal Pain   This is a recurrent problem. The current episode started 12 to 24 hours ago. The problem occurs constantly. The problem has not changed since onset.The pain is associated with an unknown factor. The pain is located in the epigastric region. The quality of the pain is dull. Pertinent negatives include diarrhea, frequency, hematuria and headaches.    Past Medical History:  Diagnosis Date  . Bowel obstruction (HCC)   . Chronic abdominal pain   . CKD (chronic kidney disease)   . DVT (deep venous thrombosis) (HCC)    lower extremity  . Hypertension   . Ileus (HCC) 01/03/2012  . Kidney stones   . Migraine headache   . Pain management   . Pancreatitis   . Pancytopenia (HCC)   . Retroperitoneal bleed 01/04/2012    Patient Active Problem List   Diagnosis Date Noted  . SBO (small bowel obstruction) (HCC) 08/15/2016  . AKI (acute kidney injury) (HCC) 08/15/2016  . CKD (chronic kidney disease), stage III 08/15/2016  . History of DVT (deep vein thrombosis) 08/15/2016  . Thrombocytopenia (HCC) 08/15/2016  . Generalized abdominal pain   . Small bowel obstruction (HCC)   . Pneumatosis intestinalis   . Hyperglycemia 01/05/2012  . Peripheral edema 01/04/2012  . Ileus (HCC) 01/03/2012  . Anemia 01/03/2012  . Metabolic acidosis 12/30/2011  . Hypocalcemia 12/30/2011  . Hypertriglyceridemia 12/30/2011  . Chronic pancreatitis (HCC) 12/29/2011  . Hepatic steatosis 12/29/2011  . HTN (hypertension) 10/15/2011  . Alcohol abuse 10/15/2011  . Obesity  10/15/2011    Past Surgical History:  Procedure Laterality Date  . chronic pancreatitiis    . COLON RESECTION    . COLOSTOMY REVERSAL    . HERNIA REPAIR    . IVC FILTER PLACEMENT ([facility] HX)    . LITHOTRIPSY    . Mucous Fistula     2013  . PANCREAS SURGERY     half of pancreas removed  . PANCREATITIC FISTULA    . SBO         Home Medications    Prior to Admission medications   Medication Sig Start Date End Date Taking? Authorizing Provider  amitriptyline (ELAVIL) 150 MG tablet Take 150 mg by mouth at bedtime.  11/30/14  Yes Historical Provider, MD  Cholecalciferol (VITAMIN D PO) Take 1 tablet by mouth daily.   Yes Historical Provider, MD  CREON 36000 UNITS CPEP capsule Take 36,000-108,000 Units by mouth 3 (three) times daily with meals. 1 with snacks and 3 with meals 12/11/14  Yes Historical Provider, MD  docusate sodium (COLACE) 100 MG capsule Take 100 mg by mouth daily. For constipation 12/19/14  Yes Historical Provider, MD  ferrous sulfate 325 (65 FE) MG tablet Take 325 mg by mouth daily.   Yes Historical Provider, MD  folic acid (FOLVITE) 1 MG tablet Take 1 mg by mouth daily. 04/13/15  Yes Historical Provider, MD  magnesium oxide (MAG-OX) 400 (241.3 MG) MG tablet Take 1 tablet by mouth 2 (two) times daily. 01/13/15  Yes Historical Provider,  MD  Multiple Vitamin (MULTIVITAMIN WITH MINERALS) TABS tablet Take 1 tablet by mouth daily.   Yes Historical Provider, MD  oxyCODONE (OXY IR/ROXICODONE) 5 MG immediate release tablet Take 5 mg by mouth 2 (two) times daily as needed for moderate pain or severe pain.  10/10/16  Yes Historical Provider, MD  oxyCODONE-acetaminophen (PERCOCET/ROXICET) 5-325 MG tablet 1 or 2 tabs PO q6h prn pain Patient taking differently: Take 1-2 tablets by mouth every 6 (six) hours as needed.  11/07/16  Yes [name], DO  rivaroxaban (XARELTO) 10 MG TABS tablet Take 10 mg by mouth every morning. 10/30/16  Yes Historical Provider, MD  sildenafil (REVATIO) 20 MG  tablet Take 20 mg by mouth as needed (1 hour prior to sexual activity).  08/10/16  Yes Historical Provider, MD  topiramate (TOPAMAX) 50 MG tablet Take 50-100 mg by mouth 2 (two) times daily. 1 tablet in the Am and 2 tablets at night   Yes Historical Provider, MD  vitamin B-12 (CYANOCOBALAMIN) 1000 MCG tablet Take 1,000 mcg by mouth every Monday, Wednesday, and Friday.    Yes Historical Provider, MD  amoxicillin (AMOXIL) 500 MG capsule Take 1 capsule (500 mg total) by mouth 3 (three) times daily. Patient not taking: Reported on 02/08/2017 01/04/17   [name], PA-C  HYDROcodone-homatropine [facility]) 5-1.5 MG/5ML syrup Take 5 mLs by mouth every 6 (six) hours as needed. Patient not taking: Reported on 02/08/2017 01/04/17   [name], PA-C  methocarbamol (ROBAXIN) 500 MG tablet Take 2 tablets (1,000 mg total) by mouth 4 (four) times daily as needed for muscle spasms (muscle spasm/pain). Patient not taking: Reported on 02/08/2017 11/07/16   [name], DO    Family History Family History  Problem Relation Age of Onset  . Colon cancer Neg Hx     Social History Social History  Substance Use Topics  . Smoking status: Never Smoker  . Smokeless tobacco: Never Used  . Alcohol use No     Comment: denies today 07/21/2015     Allergies   Metrizamide; Ace inhibitors; Coconut flavor; Contrast media [iodinated diagnostic agents]; and Iodine   Review of Systems Review of Systems  Constitutional: Negative for appetite change and fatigue.  HENT: Negative for congestion, ear discharge and sinus pressure.   Eyes: Negative for discharge.  Respiratory: Negative for cough.   Cardiovascular: Negative for chest pain.  Gastrointestinal: Positive for abdominal pain. Negative for diarrhea.  Genitourinary: Negative for frequency and hematuria.  Musculoskeletal: Negative for back pain.  Skin: Negative for rash.  Neurological: Negative for seizures and headaches.  Psychiatric/Behavioral: Negative for  hallucinations.     Physical Exam Updated Vital Signs BP 119/86   Pulse 83   Temp 97.9 F (36.6 C) (Oral)   Resp 16   Ht 5\' 10"  (1.778 m)   Wt 245 lb (111.1 kg)   SpO2 97%   BMI 35.15 kg/m   Physical Exam  Constitutional: He is oriented to person, place, and time. He appears well-developed.  HENT:  Head: Normocephalic.  Eyes: Conjunctivae and EOM are normal. No scleral icterus.  Neck: Neck supple. No thyromegaly present.  Cardiovascular: Normal rate and regular rhythm.  Exam reveals no gallop and no friction rub.   No murmur heard. Pulmonary/Chest: No stridor. He has no wheezes. He has no rales. He exhibits no tenderness.  Abdominal: He exhibits no distension. There is tenderness. There is no rebound.  Moderate epigastric tenderness  Musculoskeletal: Normal range of motion. He exhibits no edema.  Lymphadenopathy:  He has no cervical adenopathy.  Neurological: He is oriented to person, place, and time. He exhibits normal muscle tone. Coordination normal.  Skin: No rash noted. No erythema.  Psychiatric: He has a normal mood and affect. His behavior is normal.     ED Treatments / Results  Labs (all labs ordered are listed, but only abnormal results are displayed) Labs Reviewed  COMPREHENSIVE METABOLIC PANEL - Abnormal; Notable for the following:       Result Value   CO2 20 (*)    Creatinine, Ser 2.05 (*)    GFR calc non Af Amer 37 (*)    GFR calc Af Amer 42 (*)    All other components within normal limits  CBC - Abnormal; Notable for the following:    WBC 3.8 (*)    Hemoglobin 11.5 (*)    HCT 35.6 (*)    MCH 25.8 (*)    RDW 19.2 (*)    Platelets 114 (*)    All other components within normal limits  LIPASE, BLOOD  URINALYSIS, ROUTINE W REFLEX MICROSCOPIC    EKG  EKG Interpretation None       Radiology No results found.  Procedures Procedures (including critical care time)  Medications Ordered in ED Medications  HYDROmorphone (DILAUDID)  injection 1 mg (not administered)  ondansetron (ZOFRAN) injection 4 mg (not administered)  sodium chloride 0.9 % bolus 1,000 mL (1,000 mLs Intravenous New Bag/Given 02/08/17 2035)  HYDROmorphone (DILAUDID) injection 1 mg (1 mg Intravenous Given 02/08/17 2036)  ondansetron (ZOFRAN) injection 4 mg (4 mg Intravenous Given 02/08/17 2036)     Initial Impression / Assessment and Plan / ED Course  I have reviewed the triage vital signs and the nursing notes.  Pertinent labs & imaging results that were available during my care of the patient were reviewed by me and considered in my medical decision making (see chart for details).     Patient with a small bowel obstruction. He will be admitted to medicine with surgery consult in morning  Final Clinical Impressions(s) / ED Diagnoses   Final diagnoses:  Small bowel obstruction [facility](HCC)    New Prescriptions New Prescriptions   No medications on file     Bethann BerkshireJoseph Auther Lyerly, MD 02/08/17 2359

## 2017-02-09 ENCOUNTER — Encounter (HOSPITAL_COMMUNITY): Payer: Self-pay | Admitting: *Deleted

## 2017-02-09 ENCOUNTER — Observation Stay (HOSPITAL_COMMUNITY): Payer: Medicare Other

## 2017-02-09 DIAGNOSIS — Z87442 Personal history of urinary calculi: Secondary | ICD-10-CM | POA: Diagnosis not present

## 2017-02-09 DIAGNOSIS — K861 Other chronic pancreatitis: Secondary | ICD-10-CM

## 2017-02-09 DIAGNOSIS — K565 Intestinal adhesions [bands], unspecified as to partial versus complete obstruction: Secondary | ICD-10-CM | POA: Diagnosis present

## 2017-02-09 DIAGNOSIS — Z7901 Long term (current) use of anticoagulants: Secondary | ICD-10-CM | POA: Diagnosis not present

## 2017-02-09 DIAGNOSIS — K760 Fatty (change of) liver, not elsewhere classified: Secondary | ICD-10-CM | POA: Diagnosis present

## 2017-02-09 DIAGNOSIS — Z91018 Allergy to other foods: Secondary | ICD-10-CM | POA: Diagnosis not present

## 2017-02-09 DIAGNOSIS — Z86718 Personal history of other venous thrombosis and embolism: Secondary | ICD-10-CM | POA: Diagnosis not present

## 2017-02-09 DIAGNOSIS — Z95828 Presence of other vascular implants and grafts: Secondary | ICD-10-CM | POA: Diagnosis not present

## 2017-02-09 DIAGNOSIS — Z6835 Body mass index (BMI) 35.0-35.9, adult: Secondary | ICD-10-CM | POA: Diagnosis not present

## 2017-02-09 DIAGNOSIS — E669 Obesity, unspecified: Secondary | ICD-10-CM | POA: Diagnosis present

## 2017-02-09 DIAGNOSIS — N183 Chronic kidney disease, stage 3 (moderate): Secondary | ICD-10-CM | POA: Diagnosis not present

## 2017-02-09 DIAGNOSIS — Z933 Colostomy status: Secondary | ICD-10-CM | POA: Diagnosis not present

## 2017-02-09 DIAGNOSIS — K56609 Unspecified intestinal obstruction, unspecified as to partial versus complete obstruction: Secondary | ICD-10-CM | POA: Diagnosis not present

## 2017-02-09 DIAGNOSIS — K5651 Intestinal adhesions [bands], with partial obstruction: Secondary | ICD-10-CM | POA: Diagnosis not present

## 2017-02-09 DIAGNOSIS — E860 Dehydration: Secondary | ICD-10-CM | POA: Diagnosis present

## 2017-02-09 DIAGNOSIS — Z91041 Radiographic dye allergy status: Secondary | ICD-10-CM | POA: Diagnosis not present

## 2017-02-09 DIAGNOSIS — N179 Acute kidney failure, unspecified: Secondary | ICD-10-CM | POA: Diagnosis present

## 2017-02-09 DIAGNOSIS — Z888 Allergy status to other drugs, medicaments and biological substances status: Secondary | ICD-10-CM | POA: Diagnosis not present

## 2017-02-09 DIAGNOSIS — G8929 Other chronic pain: Secondary | ICD-10-CM | POA: Diagnosis present

## 2017-02-09 DIAGNOSIS — I129 Hypertensive chronic kidney disease with stage 1 through stage 4 chronic kidney disease, or unspecified chronic kidney disease: Secondary | ICD-10-CM | POA: Diagnosis present

## 2017-02-09 DIAGNOSIS — D696 Thrombocytopenia, unspecified: Secondary | ICD-10-CM | POA: Diagnosis present

## 2017-02-09 DIAGNOSIS — Z79899 Other long term (current) drug therapy: Secondary | ICD-10-CM | POA: Diagnosis not present

## 2017-02-09 LAB — COMPREHENSIVE METABOLIC PANEL
ALBUMIN: 3.9 g/dL (ref 3.5–5.0)
ALK PHOS: 79 U/L (ref 38–126)
ALT: 26 U/L (ref 17–63)
ANION GAP: 4 — AB (ref 5–15)
AST: 24 U/L (ref 15–41)
BUN: 15 mg/dL (ref 6–20)
CALCIUM: 8.6 mg/dL — AB (ref 8.9–10.3)
CO2: 25 mmol/L (ref 22–32)
Chloride: 109 mmol/L (ref 101–111)
Creatinine, Ser: 1.99 mg/dL — ABNORMAL HIGH (ref 0.61–1.24)
GFR calc Af Amer: 44 mL/min — ABNORMAL LOW (ref >60)
GFR calc non Af Amer: 38 mL/min — ABNORMAL LOW (ref >60)
GLUCOSE: 109 mg/dL — AB (ref 65–99)
Potassium: 4.5 mmol/L (ref 3.5–5.1)
SODIUM: 138 mmol/L (ref 135–145)
Total Bilirubin: 0.5 mg/dL (ref 0.3–1.2)
Total Protein: 7.1 g/dL (ref 6.5–8.1)

## 2017-02-09 LAB — CBC
HEMATOCRIT: 35.1 % — AB (ref 39.0–52.0)
HEMOGLOBIN: 11.1 g/dL — AB (ref 13.0–17.0)
MCH: 25.7 pg — ABNORMAL LOW (ref 26.0–34.0)
MCHC: 31.6 g/dL (ref 30.0–36.0)
MCV: 81.3 fL (ref 78.0–100.0)
Platelets: 111 10*3/uL — ABNORMAL LOW (ref 150–400)
RBC: 4.32 MIL/uL (ref 4.22–5.81)
RDW: 19.8 % — ABNORMAL HIGH (ref 11.5–15.5)
WBC: 3 10*3/uL — AB (ref 4.0–10.5)

## 2017-02-09 LAB — MRSA PCR SCREENING: MRSA by PCR: NEGATIVE

## 2017-02-09 MED ORDER — SODIUM CHLORIDE 0.9 % IV SOLN
INTRAVENOUS | Status: DC
  Administered 2017-02-09 – 2017-02-10 (×4): via INTRAVENOUS

## 2017-02-09 MED ORDER — DIPHENHYDRAMINE HCL 50 MG/ML IJ SOLN
12.5000 mg | Freq: Four times a day (QID) | INTRAMUSCULAR | Status: DC | PRN
  Administered 2017-02-09 – 2017-02-10 (×4): 12.5 mg via INTRAVENOUS
  Filled 2017-02-09 (×4): qty 1

## 2017-02-09 MED ORDER — HYDROMORPHONE HCL 1 MG/ML IJ SOLN
1.0000 mg | INTRAMUSCULAR | Status: DC | PRN
  Administered 2017-02-09 – 2017-02-10 (×8): 1 mg via INTRAVENOUS
  Filled 2017-02-09 (×8): qty 1

## 2017-02-09 MED ORDER — ACETAMINOPHEN 325 MG PO TABS: 650.0000 mg | ORAL_TABLET | Freq: Four times a day (QID) | ORAL | Status: DC | PRN

## 2017-02-09 MED ORDER — RIVAROXABAN 10 MG PO TABS
10.0000 mg | ORAL_TABLET | Freq: Every morning | ORAL | Status: DC
  Administered 2017-02-09 – 2017-02-10 (×2): 10 mg via ORAL
  Filled 2017-02-09 (×3): qty 1

## 2017-02-09 MED ORDER — ACETAMINOPHEN 650 MG RE SUPP: 650.0000 mg | Freq: Four times a day (QID) | RECTAL | Status: DC | PRN

## 2017-02-09 MED ORDER — ONDANSETRON HCL 4 MG/2ML IJ SOLN: 4.0000 mg | Freq: Four times a day (QID) | INTRAMUSCULAR | Status: DC | PRN

## 2017-02-09 MED ORDER — BISACODYL 5 MG PO TBEC
10.0000 mg | DELAYED_RELEASE_TABLET | Freq: Once | ORAL | Status: AC
  Administered 2017-02-09: 10 mg via ORAL
  Filled 2017-02-09: qty 2

## 2017-02-09 MED ORDER — ONDANSETRON HCL 4 MG PO TABS: 4.0000 mg | ORAL_TABLET | Freq: Four times a day (QID) | ORAL | Status: DC | PRN

## 2017-02-09 NOTE — H&P (Addendum)
TRH H&P    Patient Demographics:    Peter [name], is a 46 y.o. male  MRN: [id number]  DOB - 07-21-1971  Admit Date - 02/08/2017  Referring MD/NP/PA: Dr Estell Harpin  Outpatient Primary MD for the patient is PROVIDER NOT IN SYSTEM  Patient coming from: home   Chief Complaint  Patient presents with  . Abdominal Pain      HPI:    Peter [name]  is a 47 y.o. 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 Patient also had multiple bouts of small bowel obstruction, underwent surgery in November 2017. Today patient came to hospital with complaints of abdominal pain. Patient says that he gets daily abdominal pain but today the pain was worse and also had dizziness. He denies nausea vomiting or diarrhea. No constipation. Patient had normal BM today. He denies chest pain or shortness of breath. No fever or dysuria.  In the ED, CT scan of the abdomen and pelvis showed adhesion in  left upper quadrant of the abdomen contributing to proximal small bowel obstruction of the proximal jejunum. Tethered appearance of small bowel with the lateral conal fascia there is  noted with tiny focus of air at the apex of the adhesion, nonspecific potentially a focus of air within bowel to a tiny fistulous connection is not excluded. General surgery was consulted by ED physician, Dr. Lovell Sheehan will see the patient in a.m.   Review of systems:      A full 10 point Review of Systems was done, except as stated above, all other Review of Systems were negative.   With Past History of the following :    Past Medical History:  Diagnosis Date  . Bowel obstruction (HCC)   . Chronic abdominal pain   . CKD (chronic kidney disease)   . DVT (deep venous thrombosis) (HCC)    lower extremity  . Hypertension   . Ileus (HCC) 01/03/2012  . Kidney  stones   . Migraine headache   . Pain management   . Pancreatitis   . Pancytopenia (HCC)   . Retroperitoneal bleed 01/04/2012      Past Surgical History:  Procedure Laterality Date  . chronic pancreatitiis    . COLON RESECTION    . COLOSTOMY REVERSAL    . HERNIA REPAIR    . IVC FILTER PLACEMENT ([facility] HX)    . LITHOTRIPSY    . Mucous Fistula     2013  . PANCREAS SURGERY     half of pancreas removed  . PANCREATITIC FISTULA    . SBO        Social History:      Social History  Substance Use Topics  . Smoking status: Never Smoker  . Smokeless tobacco: Never Used  . Alcohol use No     Comment: denies today 07/21/2015       Family History :     Family History  Problem Relation Age of Onset  . Colon cancer Neg Hx  Home Medications:   Prior to Admission medications   Medication Sig Start Date End Date Taking? Authorizing Provider  amitriptyline (ELAVIL) 150 MG tablet Take 150 mg by mouth at bedtime.  11/30/14  Yes Historical Provider, MD  Cholecalciferol (VITAMIN D PO) Take 1 tablet by mouth daily.   Yes Historical Provider, MD  CREON 36000 UNITS CPEP capsule Take 36,000-108,000 Units by mouth 3 (three) times daily with meals. 1 with snacks and 3 with meals 12/11/14  Yes Historical Provider, MD  docusate sodium (COLACE) 100 MG capsule Take 100 mg by mouth daily. For constipation 12/19/14  Yes Historical Provider, MD  ferrous sulfate 325 (65 FE) MG tablet Take 325 mg by mouth daily.   Yes Historical Provider, MD  folic acid (FOLVITE) 1 MG tablet Take 1 mg by mouth daily. 04/13/15  Yes Historical Provider, MD  magnesium oxide (MAG-OX) 400 (241.3 MG) MG tablet Take 1 tablet by mouth 2 (two) times daily. 01/13/15  Yes Historical Provider, MD  Multiple Vitamin (MULTIVITAMIN WITH MINERALS) TABS tablet Take 1 tablet by mouth daily.   Yes Historical Provider, MD  oxyCODONE (OXY IR/ROXICODONE) 5 MG immediate release tablet Take 5 mg by mouth 2 (two) times daily as needed for  moderate pain or severe pain.  10/10/16  Yes Historical Provider, MD  oxyCODONE-acetaminophen (PERCOCET/ROXICET) 5-325 MG tablet 1 or 2 tabs PO q6h prn pain Patient taking differently: Take 1-2 tablets by mouth every 6 (six) hours as needed.  11/07/16  Yes [name], DO  rivaroxaban (XARELTO) 10 MG TABS tablet Take 10 mg by mouth every morning. 10/30/16  Yes Historical Provider, MD  sildenafil (REVATIO) 20 MG tablet Take 20 mg by mouth as needed (1 hour prior to sexual activity).  08/10/16  Yes Historical Provider, MD  topiramate (TOPAMAX) 50 MG tablet Take 50-100 mg by mouth 2 (two) times daily. 1 tablet in the Am and 2 tablets at night   Yes Historical Provider, MD  vitamin B-12 (CYANOCOBALAMIN) 1000 MCG tablet Take 1,000 mcg by mouth every Monday, Wednesday, and Friday.    Yes Historical Provider, MD  amoxicillin (AMOXIL) 500 MG capsule Take 1 capsule (500 mg total) by mouth 3 (three) times daily. Patient not taking: Reported on 02/08/2017 01/04/17   [name], PA-C  HYDROcodone-homatropine [facility]) 5-1.5 MG/5ML syrup Take 5 mLs by mouth every 6 (six) hours as needed. Patient not taking: Reported on 02/08/2017 01/04/17   [name], PA-C  methocarbamol (ROBAXIN) 500 MG tablet Take 2 tablets (1,000 mg total) by mouth 4 (four) times daily as needed for muscle spasms (muscle spasm/pain). Patient not taking: Reported on 02/08/2017 11/07/16   [name], DO     Allergies:     Allergies  Allergen Reactions  . Metrizamide Anaphylaxis    Patient is allergic to IV contrast per Dr Rolland Porter.  . Ace Inhibitors Swelling    Lip swelling  . Coconut Flavor Swelling  . Contrast Media [Iodinated Diagnostic Agents]     Patient is allergic to IV contrast per Dr Rolland Porter.  . Iodine     Patient experienced shortness of breath and chest tightness. Albuterol and benadryl were required.     Physical Exam:   Vitals  Blood pressure 119/86, pulse 83, temperature 97.9 F (36.6 C), temperature  source Oral, resp. rate 16, height 5\' 10"  (1.778 m), weight 111.1 kg (245 lb), SpO2 97 %.  1.  General: Appears in no acute distress  2. Psychiatric:  Intact judgement and  insight,  awake alert, oriented x 3.  3. Neurologic: No focal neurological deficits, all cranial nerves intact.Strength 5/5 all 4 extremities, sensation intact all 4 extremities, plantars down going.  4. Eyes :  anicteric sclerae, moist conjunctivae with no lid lag. PERRLA.  5. ENMT:  Oropharynx clear with moist mucous membranes and good dentition  6. Neck:  supple, no cervical lymphadenopathy appriciated, No thyromegaly  7. Respiratory : Normal respiratory effort, good air movement bilaterally,clear to  auscultation bilaterally  8. Cardiovascular : RRR, no gallops, rubs or murmurs, no leg edema  9. Gastrointestinal:  Positive bowel sounds, generalized tenderness to palpation, positive guarding no hepatosplenomegaly, no rigidity. Mucous fistula bag in place     10. Skin:  No cyanosis, normal texture and turgor, no rash, lesions or ulcers  11.Musculoskeletal:  Good muscle tone,  joints appear normal , no effusions,  normal range of motion    Data Review:    CBC  Recent Labs Lab 02/08/17 1637  WBC 3.8*  HGB 11.5*  HCT 35.6*  PLT 114*  MCV 79.8  MCH 25.8*  MCHC 32.3  RDW 19.2*   ------------------------------------------------------------------------------------------------------------------  Chemistries   Recent Labs Lab 02/08/17 1637  NA 135  K 4.0  CL 107  CO2 20*  GLUCOSE 91  BUN 16  CREATININE 2.05*  CALCIUM 9.1  AST 29  ALT 30  ALKPHOS 79  BILITOT 0.5   ------------------------------------------------------------------------------------------------------------------  ------------------------------------------------------------------------------------------------------------------ GFR: Estimated Creatinine Clearance: 55 mL/min (A) (by C-G formula based on SCr of 2.05  mg/dL (H)). Liver Function Tests:  Recent Labs Lab 02/08/17 1637  AST 29  ALT 30  ALKPHOS 79  BILITOT 0.5  PROT 8.1  ALBUMIN 4.4    Recent Labs Lab 02/08/17 1637  LIPASE 15    --------------------------------------------------------------------------------------------------------------- Urine analysis:    Component Value Date/Time   COLORURINE YELLOW 02/08/2017 1609   APPEARANCEUR CLEAR 02/08/2017 1609   LABSPEC 1.018 02/08/2017 1609   PHURINE 7.0 02/08/2017 1609   GLUCOSEU NEGATIVE 02/08/2017 1609   HGBUR NEGATIVE 02/08/2017 1609   BILIRUBINUR NEGATIVE 02/08/2017 1609   KETONESUR NEGATIVE 02/08/2017 1609   PROTEINUR NEGATIVE 02/08/2017 1609   UROBILINOGEN 0.2 07/27/2015 2300   NITRITE NEGATIVE 02/08/2017 1609   LEUKOCYTESUR NEGATIVE 02/08/2017 1609      Imaging Results:    No results found.  My personal review of EKG: Rhythm - Sinus tachycardia   Assessment & Plan:    Active Problems:   Small bowel obstruction (HCC)   1. Small bowel obstruction- CT abdomen and pelvis shows adhesions and left upper quadrant contributing to  small bowel obstruction of proximal jejunum. We'll keep patient nothing by mouth, continue IV normal saline at 100 ML per hour. Consult general surgery in a.m. Repeat abdominal x-ray in a.m. 2. Abdominal pain- patient has chronic abdominal pain and today the pain is persistent 8/10 in  intensity, will start Dilaudid 1 mg IV every 4 hours when necessary. 3. History of DVT- continue Xarelto 4. Acute on chronic kidney stage III- patient's last creatinine from January 2018 was 1.76, today creatinine is 2.05. Likely from dehydration. Started on IV normal saline as above. Follow BMP in a.m.   DVT Prophylaxis-  Xarelto  AM Labs Ordered, also please review Full Orders  Family Communication: Admission, patients condition and plan of care including tests being ordered have been discussed with the patient and his girlfriend at bedside who indicate  understanding and agree with the plan and Code Status.  Code Status:  Full code  Admission status: Observation  Time spent in minutes : 60 minutes   Yamira Papa S M.D on 02/09/2017 at 12:39 AM  Between 7am to 7pm - Pager - [phone]. After 7pm go to [email or web address] - password [email or web address]  [facility] - Office  [phone]

## 2017-02-09 NOTE — Consult Note (Signed)
Reason for Consult: Small bowel obstruction Referring Physician: Dr. Lang Snow is an 46 y.o. male.  HPI: Patient is a 46 year old black male status post exploratory laparotomy in November 2017 at [facility] who presents with several day history of worsening upper abdominal discomfort. He states he chronically has pain in this area. He denies any nausea or vomiting. His bowel movements have been normal. He denies any diarrhea, fever, chills. 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 He states that his surgeon reluctantly operate on him in November and found significant adhesive disease. Currently, he has 2 out of 10 pain. He has no nausea or vomiting. He is passing flatus. He is hungry.  Past Medical History:  Diagnosis Date  . Bowel obstruction ([place])   . Chronic abdominal pain   . CKD (chronic kidney disease)   . DVT (deep venous thrombosis) ([place])    lower extremity  . Hypertension   . Ileus ([place]) 01/03/2012  . Kidney stones   . Migraine headache   . Pain management   . Pancreatitis   . Pancytopenia ([place])   . Retroperitoneal bleed 01/04/2012    Past Surgical History:  Procedure Laterality Date  . chronic pancreatitiis    . COLON RESECTION    . COLOSTOMY REVERSAL    . HERNIA REPAIR    . IVC FILTER PLACEMENT ([facility] HX)    . LITHOTRIPSY    . Mucous Fistula     2013  . PANCREAS SURGERY     half of pancreas removed  . PANCREATITIC FISTULA    . SBO      Family History  Problem Relation Age of Onset  . Colon cancer Neg Hx     Social History:  reports that he has never smoked. He has never used smokeless tobacco. He reports that he does not drink alcohol or use drugs.  Allergies:  Allergies  Allergen Reactions  . Metrizamide Anaphylaxis    Patient is allergic to IV contrast per Dr Tanna Furry.  . Ace Inhibitors Swelling    Lip swelling  . Coconut Flavor Swelling  . Contrast  Media [Iodinated Diagnostic Agents]     Patient is allergic to IV contrast per Dr Tanna Furry.  . Iodine     Patient experienced shortness of breath and chest tightness. Albuterol and benadryl were required.    Medications:  Prior to Admission:  Prescriptions Prior to Admission  Medication Sig Dispense Refill Last Dose  . amitriptyline (ELAVIL) 150 MG tablet Take 150 mg by mouth at bedtime.    02/07/2017 at Unknown time  . Cholecalciferol (VITAMIN D PO) Take 1 tablet by mouth daily.   02/08/2017 at Unknown time  . CREON 36000 UNITS CPEP capsule Take 36,000-108,000 Units by mouth 3 (three) times daily with meals. 1 with snacks and 3 with meals   02/08/2017 at Unknown time  . docusate sodium (COLACE) 100 MG capsule Take 100 mg by mouth daily. For constipation   02/08/2017 at Unknown time  . ferrous sulfate 325 (65 FE) MG tablet Take 325 mg by mouth daily.   02/08/2017 at Unknown time  . folic acid (FOLVITE) 1 MG tablet Take 1 mg by mouth daily.   02/08/2017 at Unknown time  . magnesium oxide (MAG-OX) 400 (241.3 MG) MG tablet Take 1 tablet by mouth 2 (two) times daily.   02/08/2017 at Unknown time  . Multiple Vitamin (MULTIVITAMIN WITH MINERALS) TABS  tablet Take 1 tablet by mouth daily.   02/08/2017 at Unknown time  . oxyCODONE (OXY IR/ROXICODONE) 5 MG immediate release tablet Take 5 mg by mouth 2 (two) times daily as needed for moderate pain or severe pain.    02/07/2017 at Unknown time  . oxyCODONE-acetaminophen (PERCOCET/ROXICET) 5-325 MG tablet 1 or 2 tabs PO q6h prn pain (Patient taking differently: Take 1-2 tablets by mouth every 6 (six) hours as needed. ) 15 tablet 0 02/07/2017 at Unknown time  . rivaroxaban (XARELTO) 10 MG TABS tablet Take 10 mg by mouth every morning.   02/08/2017 at 800a  . sildenafil (REVATIO) 20 MG tablet Take 20 mg by mouth as needed (1 hour prior to sexual activity).    unknown  . topiramate (TOPAMAX) 50 MG tablet Take 50-100 mg by mouth 2 (two) times daily. 1 tablet in the Am and 2  tablets at night   02/08/2017 at Unknown time  . vitamin B-12 (CYANOCOBALAMIN) 1000 MCG tablet Take 1,000 mcg by mouth every Monday, Wednesday, and Friday.    02/08/2017 at Unknown time  . amoxicillin (AMOXIL) 500 MG capsule Take 1 capsule (500 mg total) by mouth 3 (three) times daily. (Patient not taking: Reported on 02/08/2017) 21 capsule 0 Not Taking at Unknown time  . HYDROcodone-homatropine (HYCODAN) 5-1.5 MG/5ML syrup Take 5 mLs by mouth every 6 (six) hours as needed. (Patient not taking: Reported on 02/08/2017) 120 mL 0 Not Taking at Unknown time  . methocarbamol (ROBAXIN) 500 MG tablet Take 2 tablets (1,000 mg total) by mouth 4 (four) times daily as needed for muscle spasms (muscle spasm/pain). (Patient not taking: Reported on 02/08/2017) 25 tablet 0 Not Taking at Unknown time   Scheduled: . rivaroxaban  10 mg Oral q morning - 10a    Results for orders placed or performed during the hospital encounter of 02/08/17 (from the past 48 hour(s))  Urinalysis, Routine w reflex microscopic     Status: None   Collection Time: 02/08/17  4:09 PM  Result Value Ref Range   Color, Urine YELLOW YELLOW   APPearance CLEAR CLEAR   Specific Gravity, Urine 1.018 1.005 - 1.030   pH 7.0 5.0 - 8.0   Glucose, UA NEGATIVE NEGATIVE mg/dL   Hgb urine dipstick NEGATIVE NEGATIVE   Bilirubin Urine NEGATIVE NEGATIVE   Ketones, ur NEGATIVE NEGATIVE mg/dL   Protein, ur NEGATIVE NEGATIVE mg/dL   Nitrite NEGATIVE NEGATIVE   Leukocytes, UA NEGATIVE NEGATIVE  Lipase, blood     Status: None   Collection Time: 02/08/17  4:37 PM  Result Value Ref Range   Lipase 15 11 - 51 U/L  Comprehensive metabolic panel     Status: Abnormal   Collection Time: 02/08/17  4:37 PM  Result Value Ref Range   Sodium 135 135 - 145 mmol/L   Potassium 4.0 3.5 - 5.1 mmol/L   Chloride 107 101 - 111 mmol/L   CO2 20 (L) 22 - 32 mmol/L   Glucose, Bld 91 65 - 99 mg/dL   BUN 16 6 - 20 mg/dL   Creatinine, Ser 2.05 (H) 0.61 - 1.24 mg/dL   Calcium 9.1  8.9 - 10.3 mg/dL   Total Protein 8.1 6.5 - 8.1 g/dL   Albumin 4.4 3.5 - 5.0 g/dL   AST 29 15 - 41 U/L   ALT 30 17 - 63 U/L   Alkaline Phosphatase 79 38 - 126 U/L   Total Bilirubin 0.5 0.3 - 1.2 mg/dL   GFR calc non Af  Amer 37 (L) >60 mL/min   GFR calc Af Amer 42 (L) >60 mL/min    Comment: (NOTE) The eGFR has been calculated using the CKD EPI equation. This calculation has not been validated in all clinical situations. eGFR's persistently <60 mL/min signify possible Chronic Kidney Disease.    Anion gap 8 5 - 15  CBC     Status: Abnormal   Collection Time: 02/08/17  4:37 PM  Result Value Ref Range   WBC 3.8 (L) 4.0 - 10.5 K/uL   RBC 4.46 4.22 - 5.81 MIL/uL   Hemoglobin 11.5 (L) 13.0 - 17.0 g/dL   HCT 35.6 (L) 39.0 - 52.0 %   MCV 79.8 78.0 - 100.0 fL   MCH 25.8 (L) 26.0 - 34.0 pg   MCHC 32.3 30.0 - 36.0 g/dL   RDW 19.2 (H) 11.5 - 15.5 %   Platelets 114 (L) 150 - 400 K/uL    Comment: SPECIMEN CHECKED FOR CLOTS CONSISTENT WITH PREVIOUS RESULT   MRSA PCR Screening     Status: None   Collection Time: 02/09/17  1:38 AM  Result Value Ref Range   MRSA by PCR NEGATIVE NEGATIVE    Comment:        The GeneXpert MRSA Assay (FDA approved for NASAL specimens only), is one component of a comprehensive MRSA colonization surveillance program. It is not intended to diagnose MRSA infection nor to guide or monitor treatment for MRSA infections.   CBC     Status: Abnormal   Collection Time: 02/09/17  5:14 AM  Result Value Ref Range   WBC 3.0 (L) 4.0 - 10.5 K/uL   RBC 4.32 4.22 - 5.81 MIL/uL   Hemoglobin 11.1 (L) 13.0 - 17.0 g/dL   HCT 35.1 (L) 39.0 - 52.0 %   MCV 81.3 78.0 - 100.0 fL   MCH 25.7 (L) 26.0 - 34.0 pg   MCHC 31.6 30.0 - 36.0 g/dL   RDW 19.8 (H) 11.5 - 15.5 %   Platelets 111 (L) 150 - 400 K/uL    Comment: PLATELET COUNT CONFIRMED BY SMEAR SPECIMEN CHECKED FOR CLOTS   Comprehensive metabolic panel     Status: Abnormal   Collection Time: 02/09/17  5:14 AM  Result  Value Ref Range   Sodium 138 135 - 145 mmol/L   Potassium 4.5 3.5 - 5.1 mmol/L   Chloride 109 101 - 111 mmol/L   CO2 25 22 - 32 mmol/L   Glucose, Bld 109 (H) 65 - 99 mg/dL   BUN 15 6 - 20 mg/dL   Creatinine, Ser 1.99 (H) 0.61 - 1.24 mg/dL   Calcium 8.6 (L) 8.9 - 10.3 mg/dL   Total Protein 7.1 6.5 - 8.1 g/dL   Albumin 3.9 3.5 - 5.0 g/dL   AST 24 15 - 41 U/L   ALT 26 17 - 63 U/L   Alkaline Phosphatase 79 38 - 126 U/L   Total Bilirubin 0.5 0.3 - 1.2 mg/dL   GFR calc non Af Amer 38 (L) >60 mL/min   GFR calc Af Amer 44 (L) >60 mL/min    Comment: (NOTE) The eGFR has been calculated using the CKD EPI equation. This calculation has not been validated in all clinical situations. eGFR's persistently <60 mL/min signify possible Chronic Kidney Disease.    Anion gap 4 (L) 5 - 15    Ct Abdomen Pelvis Wo Contrast  Result Date: 02/08/2017 CLINICAL DATA:  Epigastric pain x1 week ago worsening today. History of pancreatitis. EXAM: CT ABDOMEN AND  PELVIS WITHOUT CONTRAST TECHNIQUE: Multidetector CT imaging of the abdomen and pelvis was performed following the standard protocol without IV contrast. COMPARISON:  CT exams from 08/15/2016 and 11/07/2016 FINDINGS: Lower chest: Normal size cardiac chambers. Coronary arteriosclerosis along the LAD. Aortic atherosclerosis is identified. No pericardial effusion. Left lower lobe atelectasis. Hepatobiliary: No focal liver abnormality is seen. No gallstones, gallbladder wall thickening, or biliary dilatation. Lack of IV contrast limits assessment for solid organ pathology. Pancreas: The pancreatic gland is not well visualized possibly from debridement. Spleen: Normal size spleen without apparent focal mass. Adrenals/Urinary Tract: Adrenal glands are unremarkable. Kidneys are normal, without renal calculi, focal lesion, or hydronephrosis. Bladder is unremarkable. Stomach/Bowel: Stomach is decompressed. There is dilatation of proximal jejunum seen in the left upper  quadrant up to 5.5 cm secondary to what appears to be an adhesion involving the left lateral conal fascia, series 2, image 32. A tiny dot of air is noted at the apex of this adhesion possibly within bowel or representing a tiny fistulous connection. The enterocolic anastomosis in the mid abdomen is stable in appearance. Residual contrast is noted within nonobstructed, nondistended appearing large bowel exiting the right lower quadrant ostomy. Less apparent small-bowel parastomal hernia since 08/15/2016 CT. Vascular/Lymphatic: IVC filter is in place. Infrarenal aortic atherosclerosis. A few small varices are seen as before in the upper abdomen. No lymphadenopathy. A few mildly prominent subcentimeter mesenteric lymph nodes are again visualized more so in the right lower quadrant. Reproductive: Normal size prostate.  Unremarkable seminal vesicles. Other: No abdominopelvic ascites. Musculoskeletal: Midline incisional scarring along the anterior abdomen. No acute osseous abnormality. Degenerative disc disease L5-S1. IMPRESSION: 1. Adhesion in the left upper quadrant of the abdomen contributing to small-bowel obstruction of the proximal jejunum. Tethered appearance of small bowel with the lateral conal fascia there is noted with tiny focus of air at the apex of the adhesion, nonspecific potentially a focus of air within bowel though a tiny fistulous connection is not excluded. 2. Coronary arteriosclerosis. 3. Pancreatic gland is not well visualized and may be secondary to debridement. 4. IVC filter in place.  Infrarenal aortic atherosclerosis. Electronically Signed   By: Ashley Royalty M.D.   On: 02/08/2017 22:28    ROS:  Pertinent items are noted in HPI.  Blood pressure (!) 125/91, pulse 82, temperature 97.8 F (36.6 C), temperature source Oral, resp. rate 18, height _0  (1.778 m), weight 245 lb (111.1 kg), SpO2 98 %. Physical Exam: Pleasant well-developed well-nourished black male in no acute distress. Head is  normocephalic, atraumatic. Neck is supple without lymphadenopathy. Lungs clear auscultation with equal breath sounds bilaterally. Heart examination reveals a regular rate and rhythm without S3, S4, murmurs. Abdomen is soft with bowel sounds present. A mucous fistula is present along the right side of the abdomen. A large midline surgical scars present and well-healed. No hernias are noted. No rigidity is noted. CT scan report reviewed. H&P reviewed.  Assessment/Plan: Impression: Small bowel obstruction by CT scan. Clinically, the patient does not significantly have a bowel obstruction. He has had multiple abdominal surgeries in the past and does not need surgical intervention at this time. Plan: Would start full liquid diet and advance as tolerated. Should he tolerate this, he may be discharged.  Aviva Signs 02/09/2017, 8:41 AM

## 2017-02-09 NOTE — Progress Notes (Signed)
Notified Dr. Sharl MaLama, patient requesting Benadryl for itching. Awaiting response.

## 2017-02-09 NOTE — Progress Notes (Signed)
Per pt he stated he had a large BM. Pt been ambulating all day, tolerating well.

## 2017-02-09 NOTE — Progress Notes (Signed)
Patient admitted to the hospital earlier this morning by Dr. Sharl MaLama.  Patient seen and examined. He is not having any nausea or vomiting. He's not had any significant output from his ostomy.  Patient admitted with small bowel obstruction. He was started on full liquids and appears to be tolerating this, but has not had any significant flatus or bowel movements through ostomy. Continue to treat supportively. Once he is moving his bowels and having output through his ostomy, can probably discharge home in the next 24 hours. He was encouraged to ambulate.  Peter [name]

## 2017-02-10 DIAGNOSIS — D696 Thrombocytopenia, unspecified: Secondary | ICD-10-CM

## 2017-02-10 LAB — HIV ANTIBODY (ROUTINE TESTING W REFLEX): HIV Screen 4th Generation wRfx: NONREACTIVE

## 2017-02-10 MED ORDER — POLYETHYLENE GLYCOL 3350 17 G PO PACK: 17.0000 g | PACK | Freq: Every day | ORAL | 0 refills | Status: DC | PRN

## 2017-02-10 NOTE — Discharge Summary (Signed)
Physician Discharge Summary  Peter [name] RUE:[id number] DOB: [birth year] DOA: 02/08/2017  PCP: System, Provider Not In  Admit date: 02/08/2017 Discharge date: 02/10/2017  Admitted From: home Disposition:  home  Recommendations for Outpatient Follow-up:  1. Follow up with PCP in 1-2 weeks 2. Please obtain BMP/CBC in one week   Home Health: Equipment/Devices:  Discharge Condition:stable CODE STATUS: full code Diet recommendation: Heart Healthy   Brief/Interim Summary: 46 year old male with previous history of exploratory laparotomy, presented to the hospital with complaints of abdominal discomfort. He was found to have possible small bowel obstruction. He was not having any vomiting. He was placed on bowel rest was encouraged to ambulate. He eventually started to have bowel movements. Diet was advanced to soft diet which he has tolerated. He is not having any nausea or vomiting. Abdominal pain is stable and chronic. Remainder of his medical issues remained stable. Patient is stable for discharge home. He will be continued on a bowel regimen at home.  Discharge Diagnoses:  Active Problems:   HTN (hypertension)   Chronic pancreatitis (HCC)   SBO (small bowel obstruction) (HCC)   CKD (chronic kidney disease), stage III   History of DVT (deep vein thrombosis)   Thrombocytopenia (HCC)   Small bowel obstruction [facility])    Discharge Instructions  Discharge Instructions    Diet - low sodium heart healthy    Complete by:  As directed    Increase activity slowly    Complete by:  As directed      Allergies as of 02/10/2017      Reactions   Metrizamide Anaphylaxis   Patient is allergic to IV contrast per Dr Rolland Porter.   Ace Inhibitors Swelling   Lip swelling   Coconut Flavor Swelling   Contrast Media [iodinated Diagnostic Agents]    Patient is allergic to IV contrast per Dr Rolland Porter.   Iodine    Patient experienced shortness of breath and chest tightness. Albuterol and benadryl  were required.      Medication List    STOP taking these medications   amoxicillin 500 MG capsule Commonly known as:  AMOXIL   HYDROcodone-homatropine 5-1.5 MG/5ML syrup Commonly known as:  HYCODAN   methocarbamol 500 MG tablet Commonly known as:  ROBAXIN     TAKE these medications   amitriptyline 150 MG tablet Commonly known as:  ELAVIL Take 150 mg by mouth at bedtime.   CREON 36000 UNITS Cpep capsule Generic drug:  lipase/protease/amylase Take 36,000-108,000 Units by mouth 3 (three) times daily with meals. 1 with snacks and 3 with meals   docusate sodium 100 MG capsule Commonly known as:  COLACE Take 100 mg by mouth daily. For constipation   ferrous sulfate 325 (65 FE) MG tablet Take 325 mg by mouth daily.   folic acid 1 MG tablet Commonly known as:  FOLVITE Take 1 mg by mouth daily.   magnesium oxide 400 (241.3 Mg) MG tablet Commonly known as:  MAG-OX Take 1 tablet by mouth 2 (two) times daily.   multivitamin with minerals Tabs tablet Take 1 tablet by mouth daily.   oxyCODONE 5 MG immediate release tablet Commonly known as:  Oxy IR/ROXICODONE Take 5 mg by mouth 2 (two) times daily as needed for moderate pain or severe pain.   oxyCODONE-acetaminophen 5-325 MG tablet Commonly known as:  PERCOCET/ROXICET 1 or 2 tabs PO q6h prn pain What changed:  how much to take  how to take this  when to take this  reasons  to take this  additional instructions   polyethylene glycol packet Commonly known as:  MIRALAX / GLYCOLAX Take 17 g by mouth daily as needed for mild constipation.   rivaroxaban 10 MG Tabs tablet Commonly known as:  XARELTO Take 10 mg by mouth every morning.   sildenafil 20 MG tablet Commonly known as:  REVATIO Take 20 mg by mouth as needed (1 hour prior to sexual activity).   topiramate 50 MG tablet Commonly known as:  TOPAMAX Take 50-100 mg by mouth 2 (two) times daily. 1 tablet in the Am and 2 tablets at night   vitamin B-12 1000  MCG tablet Commonly known as:  CYANOCOBALAMIN Take 1,000 mcg by mouth every Monday, Wednesday, and Friday.   VITAMIN D PO Take 1 tablet by mouth daily.       Allergies  Allergen Reactions  . Metrizamide Anaphylaxis    Patient is allergic to IV contrast per Dr Rolland Porter.  . Ace Inhibitors Swelling    Lip swelling  . Coconut Flavor Swelling  . Contrast Media [Iodinated Diagnostic Agents]     Patient is allergic to IV contrast per Dr Rolland Porter.  . Iodine     Patient experienced shortness of breath and chest tightness. Albuterol and benadryl were required.    Consultations:  General surgery   Procedures/Studies: Ct Abdomen Pelvis Wo Contrast  Result Date: 02/08/2017 CLINICAL DATA:  Epigastric pain x1 week ago worsening today. History of pancreatitis. EXAM: CT ABDOMEN AND PELVIS WITHOUT CONTRAST TECHNIQUE: Multidetector CT imaging of the abdomen and pelvis was performed following the standard protocol without IV contrast. COMPARISON:  CT exams from 08/15/2016 and 11/07/2016 FINDINGS: Lower chest: Normal size cardiac chambers. Coronary arteriosclerosis along the LAD. Aortic atherosclerosis is identified. No pericardial effusion. Left lower lobe atelectasis. Hepatobiliary: No focal liver abnormality is seen. No gallstones, gallbladder wall thickening, or biliary dilatation. Lack of IV contrast limits assessment for solid organ pathology. Pancreas: The pancreatic gland is not well visualized possibly from debridement. Spleen: Normal size spleen without apparent focal mass. Adrenals/Urinary Tract: Adrenal glands are unremarkable. Kidneys are normal, without renal calculi, focal lesion, or hydronephrosis. Bladder is unremarkable. Stomach/Bowel: Stomach is decompressed. There is dilatation of proximal jejunum seen in the left upper quadrant up to 5.5 cm secondary to what appears to be an adhesion involving the left lateral conal fascia, series 2, image 32. A tiny dot of air is noted at the apex  of this adhesion possibly within bowel or representing a tiny fistulous connection. The enterocolic anastomosis in the mid abdomen is stable in appearance. Residual contrast is noted within nonobstructed, nondistended appearing large bowel exiting the right lower quadrant ostomy. Less apparent small-bowel parastomal hernia since 08/15/2016 CT. Vascular/Lymphatic: IVC filter is in place. Infrarenal aortic atherosclerosis. A few small varices are seen as before in the upper abdomen. No lymphadenopathy. A few mildly prominent subcentimeter mesenteric lymph nodes are again visualized more so in the right lower quadrant. Reproductive: Normal size prostate.  Unremarkable seminal vesicles. Other: No abdominopelvic ascites. Musculoskeletal: Midline incisional scarring along the anterior abdomen. No acute osseous abnormality. Degenerative disc disease L5-S1. IMPRESSION: 1. Adhesion in the left upper quadrant of the abdomen contributing to small-bowel obstruction of the proximal jejunum. Tethered appearance of small bowel with the lateral conal fascia there is noted with tiny focus of air at the apex of the adhesion, nonspecific potentially a focus of air within bowel though a tiny fistulous connection is not excluded. 2. Coronary arteriosclerosis. 3. Pancreatic gland  is not well visualized and may be secondary to debridement. 4. IVC filter in place.  Infrarenal aortic atherosclerosis. Electronically Signed   By: Tollie Ethavid  Kwon M.D.   On: 02/08/2017 22:28   Abd 1 View (kub)  Result Date: 02/09/2017 CLINICAL DATA:  Severe abdominal pain, history of chronic pancreatitis, ileus and small bowel obstructions. Right lower quadrant ostomy. EXAM: ABDOMEN - 1 VIEW COMPARISON:  02/08/2017 FINDINGS: Similar moderate gaseous distention of small and large bowel throughout the abdomen. Ostomy noted in the right lower quadrant. Appearance more compatible with ileus than definite obstruction. No acute osseous finding. Benign pelvic vascular  calcifications. IVC filter noted. IMPRESSION: Similar moderate gaseous distention of small and large bowel, more compatible with ileus than obstruction. Right lower quadrant ostomy noted. Electronically Signed   By: Judie PetitM.  Shick M.D.   On: 02/09/2017 11:08       Subjective: Having bowel movements, tolerating solid food  Discharge Exam: Vitals:   02/09/17 2016 02/10/17 0430  BP: (!) 139/93 125/82  Pulse: 93 86  Resp: 18 18  Temp:  97.8 F (36.6 C)   Vitals:   02/09/17 0145 02/09/17 1421 02/09/17 2016 02/10/17 0430  BP: (!) 125/91 110/78 (!) 139/93 125/82  Pulse: 82 95 93 86  Resp: 18 18 18 18   Temp: 97.8 F (36.6 C) 98.1 F (36.7 C)  97.8 F (36.6 C)  TempSrc: Oral Oral  Oral  SpO2: 98% 97% 99% 100%  Weight:      Height:        General: Pt is alert, awake, not in acute distress Cardiovascular: RRR, S1/S2 +, no rubs, no gallops Respiratory: CTA bilaterally, no wheezing, no rhonchi Abdominal: Soft, NT, ND, bowel sounds + Extremities: no edema, no cyanosis    The results of significant diagnostics from this hospitalization (including imaging, microbiology, ancillary and laboratory) are listed below for reference.     Microbiology: Recent Results (from the past 240 hour(s))  MRSA PCR Screening     Status: None   Collection Time: 02/09/17  1:38 AM  Result Value Ref Range Status   MRSA by PCR NEGATIVE NEGATIVE Final    Comment:        The GeneXpert MRSA Assay (FDA approved for NASAL specimens only), is one component of a comprehensive MRSA colonization surveillance program. It is not intended to diagnose MRSA infection nor to guide or monitor treatment for MRSA infections.      Labs: BNP (last 3 results) No results for input(s): BNP in the last 8760 hours. Basic Metabolic Panel:  Recent Labs Lab 02/08/17 1637 02/09/17 0514  NA 135 138  K 4.0 4.5  CL 107 109  CO2 20* 25  GLUCOSE 91 109*  BUN 16 15  CREATININE 2.05* 1.99*  CALCIUM 9.1 8.6*   Liver  Function Tests:  Recent Labs Lab 02/08/17 1637 02/09/17 0514  AST 29 24  ALT 30 26  ALKPHOS 79 79  BILITOT 0.5 0.5  PROT 8.1 7.1  ALBUMIN 4.4 3.9    Recent Labs Lab 02/08/17 1637  LIPASE 15   No results for input(s): AMMONIA in the last 168 hours. CBC:  Recent Labs Lab 02/08/17 1637 02/09/17 0514  WBC 3.8* 3.0*  HGB 11.5* 11.1*  HCT 35.6* 35.1*  MCV 79.8 81.3  PLT 114* 111*   Cardiac Enzymes: No results for input(s): CKTOTAL, CKMB, CKMBINDEX, TROPONINI in the last 168 hours. BNP: Invalid input(s): POCBNP CBG: No results for input(s): GLUCAP in the last 168 hours. D-Dimer No  results for input(s): DDIMER in the last 72 hours. Hgb A1c No results for input(s): HGBA1C in the last 72 hours. Lipid Profile No results for input(s): CHOL, HDL, LDLCALC, TRIG, CHOLHDL, LDLDIRECT in the last 72 hours. Thyroid function studies No results for input(s): TSH, T4TOTAL, T3FREE, THYROIDAB in the last 72 hours.  Invalid input(s): FREET3 Anemia work up No results for input(s): VITAMINB12, FOLATE, FERRITIN, TIBC, IRON, RETICCTPCT in the last 72 hours. Urinalysis    Component Value Date/Time   COLORURINE YELLOW 02/08/2017 1609   APPEARANCEUR CLEAR 02/08/2017 1609   LABSPEC 1.018 02/08/2017 1609   PHURINE 7.0 02/08/2017 1609   GLUCOSEU NEGATIVE 02/08/2017 1609   HGBUR NEGATIVE 02/08/2017 1609   BILIRUBINUR NEGATIVE 02/08/2017 1609   KETONESUR NEGATIVE 02/08/2017 1609   PROTEINUR NEGATIVE 02/08/2017 1609   UROBILINOGEN 0.2 07/27/2015 2300   NITRITE NEGATIVE 02/08/2017 1609   LEUKOCYTESUR NEGATIVE 02/08/2017 1609   Sepsis Labs Invalid input(s): PROCALCITONIN,  WBC,  LACTICIDVEN Microbiology Recent Results (from the past 240 hour(s))  MRSA PCR Screening     Status: None   Collection Time: 02/09/17  1:38 AM  Result Value Ref Range Status   MRSA by PCR NEGATIVE NEGATIVE Final    Comment:        The GeneXpert MRSA Assay (FDA approved for NASAL specimens only), is one  component of a comprehensive MRSA colonization surveillance program. It is not intended to diagnose MRSA infection nor to guide or monitor treatment for MRSA infections.      Time coordinating discharge: Over 30 minutes  SIGNED:   Erick Blinks, MD  [facility] 02/10/2017, 11:27 AM Pager   If 7PM-7AM, please contact night-coverage [email or web address] Password [email or web address]

## 2017-02-10 NOTE — Progress Notes (Signed)
IV removed, WNL. D/C instructions given to pt. Verbalized understanding. Pt wife at bedside to transport home.  

## 2017-03-05 ENCOUNTER — Encounter (HOSPITAL_COMMUNITY): Payer: Self-pay

## 2017-03-05 ENCOUNTER — Emergency Department (HOSPITAL_COMMUNITY)
Admission: EM | Admit: 2017-03-05 | Discharge: 2017-03-05 | Disposition: A | Discharge status: Home / Self Care | Payer: Medicare Other | Attending: Emergency Medicine | Admitting: Emergency Medicine

## 2017-03-05 DIAGNOSIS — R1084 Generalized abdominal pain: Secondary | ICD-10-CM

## 2017-03-05 DIAGNOSIS — I129 Hypertensive chronic kidney disease with stage 1 through stage 4 chronic kidney disease, or unspecified chronic kidney disease: Secondary | ICD-10-CM | POA: Insufficient documentation

## 2017-03-05 DIAGNOSIS — M545 Low back pain, unspecified: Secondary | ICD-10-CM

## 2017-03-05 DIAGNOSIS — N183 Chronic kidney disease, stage 3 (moderate): Secondary | ICD-10-CM | POA: Insufficient documentation

## 2017-03-05 DIAGNOSIS — R1031 Right lower quadrant pain: Secondary | ICD-10-CM | POA: Diagnosis present

## 2017-03-05 LAB — CBC WITH DIFFERENTIAL/PLATELET
Basophils Absolute: 0 10*3/uL (ref 0.0–0.1)
Basophils Relative: 0 %
EOS PCT: 2 %
Eosinophils Absolute: 0.1 10*3/uL (ref 0.0–0.7)
HEMATOCRIT: 37.9 % — AB (ref 39.0–52.0)
Hemoglobin: 12.1 g/dL — ABNORMAL LOW (ref 13.0–17.0)
LYMPHS ABS: 0.9 10*3/uL (ref 0.7–4.0)
LYMPHS PCT: 36 %
MCH: 26.5 pg (ref 26.0–34.0)
MCHC: 31.9 g/dL (ref 30.0–36.0)
MCV: 82.9 fL (ref 78.0–100.0)
Monocytes Absolute: 0.2 10*3/uL (ref 0.1–1.0)
Monocytes Relative: 8 %
NEUTROS ABS: 1.4 10*3/uL — AB (ref 1.7–7.7)
Neutrophils Relative %: 54 %
PLATELETS: 124 10*3/uL — AB (ref 150–400)
RBC: 4.57 MIL/uL (ref 4.22–5.81)
RDW: 17.9 % — ABNORMAL HIGH (ref 11.5–15.5)
WBC: 2.6 10*3/uL — AB (ref 4.0–10.5)

## 2017-03-05 LAB — URINALYSIS, ROUTINE W REFLEX MICROSCOPIC
BILIRUBIN URINE: NEGATIVE
Glucose, UA: NEGATIVE mg/dL
HGB URINE DIPSTICK: NEGATIVE
KETONES UR: NEGATIVE mg/dL
Leukocytes, UA: NEGATIVE
NITRITE: NEGATIVE
PROTEIN: NEGATIVE mg/dL
Specific Gravity, Urine: 1.018 (ref 1.005–1.030)
pH: 5 (ref 5.0–8.0)

## 2017-03-05 LAB — COMPREHENSIVE METABOLIC PANEL
ALK PHOS: 81 U/L (ref 38–126)
ALT: 32 U/L (ref 17–63)
AST: 26 U/L (ref 15–41)
Albumin: 4.1 g/dL (ref 3.5–5.0)
Anion gap: 7 (ref 5–15)
BILIRUBIN TOTAL: 0.5 mg/dL (ref 0.3–1.2)
BUN: 14 mg/dL (ref 6–20)
CALCIUM: 9.2 mg/dL (ref 8.9–10.3)
CHLORIDE: 102 mmol/L (ref 101–111)
CO2: 28 mmol/L (ref 22–32)
CREATININE: 1.8 mg/dL — AB (ref 0.61–1.24)
GFR, EST AFRICAN AMERICAN: 50 mL/min — AB (ref >60)
GFR, EST NON AFRICAN AMERICAN: 43 mL/min — AB (ref >60)
Glucose, Bld: 110 mg/dL — ABNORMAL HIGH (ref 65–99)
Potassium: 4.1 mmol/L (ref 3.5–5.1)
Sodium: 137 mmol/L (ref 135–145)
TOTAL PROTEIN: 8 g/dL (ref 6.5–8.1)

## 2017-03-05 LAB — LIPASE, BLOOD: Lipase: 21 U/L (ref 11–51)

## 2017-03-05 MED ORDER — HYDROMORPHONE HCL 1 MG/ML IJ SOLN
1.0000 mg | Freq: Once | INTRAMUSCULAR | Status: AC
  Administered 2017-03-05: 1 mg via INTRAVENOUS
  Filled 2017-03-05: qty 1

## 2017-03-05 NOTE — ED Provider Notes (Signed)
[facility]-EMERGENCY DEPT Provider Note   CSN: [id number] Arrival date & time: 03/05/17  0704   History   Chief Complaint Chief Complaint  Patient presents with  . Abdominal Pain  . Back Pain    HPI Peter [name] is a 46 y.o. male.  HPI  Patient presenting to ED with abdominal and back pain for the last few days. PMH significant for chronic pancreatitis, HTN, CKD, h/o DVT. Patient states that he has chronic abdominal pain but it has been getting worse over the last several days. He denies any associated n/v/d. Has had some loss of appetite. No fevers. Patient with chronic pancreatitis s/p pancreatic surgery and mucous fistula. Localizes abdominal pain to central abdomin and RLQ. Has new lower back pain that is midline. Denies radiating symptoms or incontinence. Patient denies any trauma or heavy lifting. Has chronic pain and takes oxycodone 2.5mg  but hasn't taken anything today for pain. Has had some difficulty with urination intermittently; initiating stream. Complains of decreased No history of BPH.    Past Medical History:  Diagnosis Date  . Bowel obstruction (HCC)   . Chronic abdominal pain   . CKD (chronic kidney disease)   . DVT (deep venous thrombosis) (HCC)    lower extremity  . Hypertension   . Ileus (HCC) 01/03/2012  . Kidney stones   . Migraine headache   . Pain management   . Pancreatitis   . Pancytopenia (HCC)   . Retroperitoneal bleed 01/04/2012    Patient Active Problem List   Diagnosis Date Noted  . AKI (acute kidney injury) (HCC) 08/15/2016  . CKD (chronic kidney disease), stage III 08/15/2016  . History of DVT (deep vein thrombosis) 08/15/2016  . Thrombocytopenia (HCC) 08/15/2016  . Generalized abdominal pain   . Small bowel obstruction (HCC)   . Pneumatosis intestinalis   . Hyperglycemia 01/05/2012  . Peripheral edema 01/04/2012  . Anemia 01/03/2012  . Metabolic acidosis 12/30/2011  . Hypocalcemia 12/30/2011  . Hypertriglyceridemia 12/30/2011  .  Chronic pancreatitis (HCC) 12/29/2011  . Hepatic steatosis 12/29/2011  . HTN (hypertension) 10/15/2011  . Alcohol abuse 10/15/2011  . Obesity 10/15/2011    Past Surgical History:  Procedure Laterality Date  . chronic pancreatitiis    . COLON RESECTION    . COLOSTOMY REVERSAL    . HERNIA REPAIR    . IVC FILTER PLACEMENT ([facility] HX)    . LITHOTRIPSY    . Mucous Fistula     2013  . PANCREAS SURGERY     half of pancreas removed  . PANCREATITIC FISTULA    . SBO      Home Medications    Prior to Admission medications   Medication Sig Start Date End Date Taking? Authorizing Provider  amitriptyline (ELAVIL) 150 MG tablet Take 150 mg by mouth at bedtime.  11/30/14   [provider]  Cholecalciferol (VITAMIN D PO) Take 1 tablet by mouth daily.    [provider]  CREON 36000 UNITS CPEP capsule Take 36,000-108,000 Units by mouth 3 (three) times daily with meals. 1 with snacks and 3 with meals 12/11/14   [provider]  docusate sodium (COLACE) 100 MG capsule Take 100 mg by mouth daily. For constipation 12/19/14   [provider]  ferrous sulfate 325 (65 FE) MG tablet Take 325 mg by mouth daily.    [provider]  folic acid (FOLVITE) 1 MG tablet Take 1 mg by mouth daily. 04/13/15   [provider]  magnesium oxide (MAG-OX) 400 (  241.3 MG) MG tablet Take 1 tablet by mouth 2 (two) times daily. 01/13/15   [provider]  Multiple Vitamin (MULTIVITAMIN WITH MINERALS) TABS tablet Take 1 tablet by mouth daily.    [provider]  oxyCODONE (OXY IR/ROXICODONE) 5 MG immediate release tablet Take 5 mg by mouth 2 (two) times daily as needed for moderate pain or severe pain.  10/10/16   [provider]  oxyCODONE-acetaminophen (PERCOCET/ROXICET) 5-325 MG tablet 1 or 2 tabs PO q6h prn pain Patient taking differently: Take 1-2 tablets by mouth every 6 (six) hours as needed.  11/07/16   [name], DO  polyethylene glycol  [facility] / GLYCOLAX) packet Take 17 g by mouth daily as needed for mild constipation. 02/10/17   Erick Blinks, MD  rivaroxaban (XARELTO) 10 MG TABS tablet Take 10 mg by mouth every morning. 10/30/16   [provider]  sildenafil (REVATIO) 20 MG tablet Take 20 mg by mouth as needed (1 hour prior to sexual activity).  08/10/16   [provider]  topiramate (TOPAMAX) 50 MG tablet Take 50-100 mg by mouth 2 (two) times daily. 1 tablet in the Am and 2 tablets at night    [provider]  vitamin B-12 (CYANOCOBALAMIN) 1000 MCG tablet Take 1,000 mcg by mouth every Monday, Wednesday, and Friday.     [provider]    Family History Family History  Problem Relation Age of Onset  . Colon cancer Neg Hx     Social History Social History  Substance Use Topics  . Smoking status: Never Smoker  . Smokeless tobacco: Never Used  . Alcohol use No     Comment: denies today 07/21/2015     Allergies   Metrizamide; Ace inhibitors; Coconut flavor; Contrast media [iodinated diagnostic agents]; and Iodine   Review of Systems Review of Systems All systems reviewed and are negative for acute change except as noted in the HPI.  Physical Exam Updated Vital Signs BP (!) 148/98 (BP Location: Left Arm)   Pulse 96   Temp 97.8 F (36.6 C) (Oral)   Resp 20   Ht 5\' 10"  (1.778 m)   Wt 113.4 kg (250 lb)   SpO2 98%   BMI 35.87 kg/m   Physical Exam  Constitutional: He is oriented to person, place, and time. He appears well-developed and well-nourished. No distress.  HENT:  Head: Normocephalic and atraumatic.  Mouth/Throat: Oropharynx is clear and moist.  Eyes: Conjunctivae and EOM are normal.  Neck: Normal range of motion.  Cardiovascular: Normal rate, regular rhythm and normal heart sounds.   Pulmonary/Chest: Effort normal and breath sounds normal.  Abdominal: Soft. Bowel sounds are normal. There is generalized tenderness.  Abdominal adhesions felt along scars. Mucous  fistula bag in place on right.    Musculoskeletal: Normal range of motion.  Neurological: He is alert and oriented to person, place, and time.  Skin: Skin is warm and dry.  Nursing note and vitals reviewed.   ED Treatments / Results  Labs (all labs ordered are listed, but only abnormal results are displayed) Labs Reviewed  CBC WITH DIFFERENTIAL/PLATELET - Abnormal; Notable for the following:       Result Value   WBC 2.6 (*)    Hemoglobin 12.1 (*)    HCT 37.9 (*)    RDW 17.9 (*)    Platelets 124 (*)    Neutro Abs 1.4 (*)    All other components within normal limits  COMPREHENSIVE METABOLIC PANEL - Abnormal; Notable for  the following:    Glucose, Bld 110 (*)    Creatinine, Ser 1.80 (*)    GFR calc non Af Amer 43 (*)    GFR calc Af Amer 50 (*)    All other components within normal limits  URINALYSIS, ROUTINE W REFLEX MICROSCOPIC  LIPASE, BLOOD    EKG  EKG Interpretation None       Radiology No results found.  Procedures Procedures (including critical care time)  Medications Ordered in ED Medications  HYDROmorphone (DILAUDID) injection 1 mg (not administered)    Initial Impression / Assessment and Plan / ED Course  I have reviewed the triage vital signs and the nursing notes.  Pertinent labs & imaging results that were available during my care of the patient were reviewed by me and considered in my medical decision making (see chart for details).  Patient presents to ED with abdominal pain. Patient has chronic abdominal pain status post multiple abdominal surgeries and complications. Pain has not been controlled with current pain regimen at home. Has new associated back pain.  UA negative for blood or signs of infection; no concern for kidney stones or pyelonephritis. Has known adhesions from multiple surgeries. Believe this is source of chronic abdominal pain. Patient given 1 mg of Dilaudid IV with improvement in pain. Lipase unremarkable no concern for  pancreatitis flare. Has known pancytopenia which is seen on labs and stable. Should follow-up with PCP.  Back pain acute and without red flags. No associated radiation symptoms. Warning signs and return precautions given. Stable for discharge.  Final Clinical Impressions(s) / ED Diagnoses   Final diagnoses:  Generalized abdominal pain  Acute midline low back pain without sciatica    New Prescriptions New Prescriptions   No medications on file     Pincus Large, DO 03/05/17 1610    Blane Ohara, MD 03/05/17 [phone]

## 2017-03-05 NOTE — ED Triage Notes (Signed)
Pt reports abd pain, difficulty urinating, and lower back pain for the past few days.  Denies n/v/d.  Pt has mucus fistula to abd.

## 2017-03-05 NOTE — Discharge Instructions (Signed)
Believe your abdominal pain is your chronic pain from scaring and pancreatitis. Please continue current pain regimens and follow-up with PCP.  Labs were unremarkable.   Back pain acute in nature. No need for further work-up at this time follow with PCP. Do back stretching and heating pad with tylenol to help.

## 2017-07-03 ENCOUNTER — Encounter (HOSPITAL_COMMUNITY): Payer: Self-pay

## 2017-07-03 ENCOUNTER — Emergency Department (HOSPITAL_COMMUNITY): Payer: Medicare Other

## 2017-07-03 ENCOUNTER — Inpatient Hospital Stay (HOSPITAL_COMMUNITY)
Admission: EM | Admit: 2017-07-03 | Discharge: 2017-07-04 | DRG: 389 | Disposition: A | Discharge status: Other Acute Inpatient Hospital | Payer: Medicare Other | Attending: Internal Medicine | Admitting: Internal Medicine

## 2017-07-03 DIAGNOSIS — D696 Thrombocytopenia, unspecified: Secondary | ICD-10-CM | POA: Diagnosis present

## 2017-07-03 DIAGNOSIS — K5651 Intestinal adhesions [bands], with partial obstruction: Secondary | ICD-10-CM

## 2017-07-03 DIAGNOSIS — K5652 Intestinal adhesions [bands] with complete obstruction: Secondary | ICD-10-CM | POA: Diagnosis not present

## 2017-07-03 DIAGNOSIS — K560 Paralytic ileus: Secondary | ICD-10-CM | POA: Diagnosis present

## 2017-07-03 DIAGNOSIS — K5669 Other partial intestinal obstruction: Secondary | ICD-10-CM | POA: Diagnosis not present

## 2017-07-03 DIAGNOSIS — Z888 Allergy status to other drugs, medicaments and biological substances status: Secondary | ICD-10-CM

## 2017-07-03 DIAGNOSIS — K861 Other chronic pancreatitis: Secondary | ICD-10-CM | POA: Diagnosis present

## 2017-07-03 DIAGNOSIS — Z91041 Radiographic dye allergy status: Secondary | ICD-10-CM

## 2017-07-03 DIAGNOSIS — R51 Headache: Secondary | ICD-10-CM | POA: Diagnosis present

## 2017-07-03 DIAGNOSIS — G894 Chronic pain syndrome: Secondary | ICD-10-CM | POA: Diagnosis present

## 2017-07-03 DIAGNOSIS — K565 Intestinal adhesions [bands], unspecified as to partial versus complete obstruction: Secondary | ICD-10-CM | POA: Diagnosis not present

## 2017-07-03 DIAGNOSIS — R112 Nausea with vomiting, unspecified: Secondary | ICD-10-CM

## 2017-07-03 DIAGNOSIS — I129 Hypertensive chronic kidney disease with stage 1 through stage 4 chronic kidney disease, or unspecified chronic kidney disease: Secondary | ICD-10-CM | POA: Diagnosis not present

## 2017-07-03 DIAGNOSIS — Z90411 Acquired partial absence of pancreas: Secondary | ICD-10-CM

## 2017-07-03 DIAGNOSIS — Z4659 Encounter for fitting and adjustment of other gastrointestinal appliance and device: Secondary | ICD-10-CM

## 2017-07-03 DIAGNOSIS — Z79899 Other long term (current) drug therapy: Secondary | ICD-10-CM

## 2017-07-03 DIAGNOSIS — N183 Chronic kidney disease, stage 3 unspecified: Secondary | ICD-10-CM

## 2017-07-03 DIAGNOSIS — Z9102 Food additives allergy status: Secondary | ICD-10-CM

## 2017-07-03 DIAGNOSIS — D61818 Other pancytopenia: Secondary | ICD-10-CM | POA: Diagnosis not present

## 2017-07-03 DIAGNOSIS — Z87442 Personal history of urinary calculi: Secondary | ICD-10-CM

## 2017-07-03 DIAGNOSIS — I82409 Acute embolism and thrombosis of unspecified deep veins of unspecified lower extremity: Secondary | ICD-10-CM | POA: Diagnosis present

## 2017-07-03 DIAGNOSIS — R109 Unspecified abdominal pain: Secondary | ICD-10-CM

## 2017-07-03 DIAGNOSIS — Z86718 Personal history of other venous thrombosis and embolism: Secondary | ICD-10-CM | POA: Diagnosis not present

## 2017-07-03 DIAGNOSIS — E860 Dehydration: Secondary | ICD-10-CM

## 2017-07-03 DIAGNOSIS — K56609 Unspecified intestinal obstruction, unspecified as to partial versus complete obstruction: Secondary | ICD-10-CM

## 2017-07-03 HISTORY — DX: Other chronic pain: G89.29

## 2017-07-03 HISTORY — DX: Dorsalgia, unspecified: M54.9

## 2017-07-03 LAB — URINALYSIS, ROUTINE W REFLEX MICROSCOPIC
BILIRUBIN URINE: NEGATIVE
GLUCOSE, UA: NEGATIVE mg/dL
HGB URINE DIPSTICK: NEGATIVE
Ketones, ur: NEGATIVE mg/dL
Leukocytes, UA: NEGATIVE
Nitrite: NEGATIVE
PROTEIN: NEGATIVE mg/dL
Specific Gravity, Urine: 1.018 (ref 1.005–1.030)
pH: 6 (ref 5.0–8.0)

## 2017-07-03 LAB — CBC
HCT: 38.5 % — ABNORMAL LOW (ref 39.0–52.0)
HEMOGLOBIN: 12.4 g/dL — AB (ref 13.0–17.0)
MCH: 27.6 pg (ref 26.0–34.0)
MCHC: 32.2 g/dL (ref 30.0–36.0)
MCV: 85.6 fL (ref 78.0–100.0)
PLATELETS: 109 10*3/uL — AB (ref 150–400)
RBC: 4.5 MIL/uL (ref 4.22–5.81)
RDW: 15.5 % (ref 11.5–15.5)
WBC: 4.2 10*3/uL (ref 4.0–10.5)

## 2017-07-03 LAB — CBC WITH DIFFERENTIAL/PLATELET
Basophils Absolute: 0 10*3/uL (ref 0.0–0.1)
Basophils Relative: 0 %
EOS ABS: 0 10*3/uL (ref 0.0–0.7)
EOS PCT: 1 %
HCT: 39 % (ref 39.0–52.0)
Hemoglobin: 12.3 g/dL — ABNORMAL LOW (ref 13.0–17.0)
LYMPHS ABS: 0.6 10*3/uL — AB (ref 0.7–4.0)
LYMPHS PCT: 18 %
MCH: 26.9 pg (ref 26.0–34.0)
MCHC: 31.5 g/dL (ref 30.0–36.0)
MCV: 85.2 fL (ref 78.0–100.0)
MONO ABS: 0.2 10*3/uL (ref 0.1–1.0)
Monocytes Relative: 6 %
Neutro Abs: 2.7 10*3/uL (ref 1.7–7.7)
Neutrophils Relative %: 75 %
PLATELETS: 120 10*3/uL — AB (ref 150–400)
RBC: 4.58 MIL/uL (ref 4.22–5.81)
RDW: 15.5 % (ref 11.5–15.5)
WBC: 3.6 10*3/uL — AB (ref 4.0–10.5)

## 2017-07-03 LAB — COMPREHENSIVE METABOLIC PANEL
ALBUMIN: 4.5 g/dL (ref 3.5–5.0)
ALT: 45 U/L (ref 17–63)
AST: 31 U/L (ref 15–41)
Alkaline Phosphatase: 84 U/L (ref 38–126)
Anion gap: 10 (ref 5–15)
BUN: 17 mg/dL (ref 6–20)
CALCIUM: 9.6 mg/dL (ref 8.9–10.3)
CHLORIDE: 100 mmol/L — AB (ref 101–111)
CO2: 25 mmol/L (ref 22–32)
CREATININE: 2.07 mg/dL — AB (ref 0.61–1.24)
GFR calc non Af Amer: 36 mL/min — ABNORMAL LOW (ref >60)
GFR, EST AFRICAN AMERICAN: 42 mL/min — AB (ref >60)
Glucose, Bld: 127 mg/dL — ABNORMAL HIGH (ref 65–99)
Potassium: 4.1 mmol/L (ref 3.5–5.1)
SODIUM: 135 mmol/L (ref 135–145)
Total Bilirubin: 0.5 mg/dL (ref 0.3–1.2)
Total Protein: 8.3 g/dL — ABNORMAL HIGH (ref 6.5–8.1)

## 2017-07-03 LAB — LIPASE, BLOOD: LIPASE: 23 U/L (ref 11–51)

## 2017-07-03 LAB — APTT
aPTT: 25 seconds (ref 24–36)
aPTT: 35 seconds (ref 24–36)

## 2017-07-03 LAB — VITAMIN B12: VITAMIN B 12: 278 pg/mL (ref 180–914)

## 2017-07-03 LAB — HEPARIN LEVEL (UNFRACTIONATED)
HEPARIN UNFRACTIONATED: 0.39 [IU]/mL (ref 0.30–0.70)
HEPARIN UNFRACTIONATED: 0.19 [IU]/mL — AB (ref 0.30–0.70)

## 2017-07-03 MED ORDER — HEPARIN BOLUS VIA INFUSION
3000.0000 [IU] | Freq: Once | INTRAVENOUS | Status: AC
  Administered 2017-07-03: 3000 [IU] via INTRAVENOUS
  Filled 2017-07-03: qty 3000

## 2017-07-03 MED ORDER — HEPARIN (PORCINE) IN NACL 100-0.45 UNIT/ML-% IJ SOLN
1750.0000 [IU]/h | INTRAMUSCULAR | Status: DC
  Administered 2017-07-03: 1650 [IU]/h via INTRAVENOUS
  Administered 2017-07-04: 1950 [IU]/h via INTRAVENOUS
  Filled 2017-07-03 (×2): qty 250

## 2017-07-03 MED ORDER — SODIUM CHLORIDE 0.9 % IV SOLN
INTRAVENOUS | Status: DC
Start: 2017-07-03 — End: 2017-07-04
  Administered 2017-07-03 – 2017-07-04 (×2): via INTRAVENOUS

## 2017-07-03 MED ORDER — ONDANSETRON HCL 4 MG/2ML IJ SOLN
4.0000 mg | Freq: Four times a day (QID) | INTRAMUSCULAR | Status: DC | PRN
Start: 2017-07-03 — End: 2017-07-04
  Administered 2017-07-03 – 2017-07-04 (×2): 4 mg via INTRAVENOUS
  Filled 2017-07-03 (×3): qty 2

## 2017-07-03 MED ORDER — ACETAMINOPHEN 650 MG RE SUPP: 650.0000 mg | Freq: Four times a day (QID) | RECTAL | Status: DC | PRN

## 2017-07-03 MED ORDER — MORPHINE SULFATE (PF) 4 MG/ML IV SOLN
4.0000 mg | INTRAVENOUS | Status: DC | PRN
  Administered 2017-07-03 – 2017-07-04 (×7): 4 mg via INTRAVENOUS
  Filled 2017-07-03 (×8): qty 1

## 2017-07-03 MED ORDER — ACETAMINOPHEN 325 MG PO TABS: 650.0000 mg | ORAL_TABLET | Freq: Four times a day (QID) | ORAL | Status: DC | PRN

## 2017-07-03 MED ORDER — ONDANSETRON HCL 4 MG/2ML IJ SOLN
4.0000 mg | INTRAMUSCULAR | Status: AC | PRN
  Administered 2017-07-03 (×2): 4 mg via INTRAVENOUS
  Filled 2017-07-03 (×2): qty 2

## 2017-07-03 MED ORDER — SODIUM CHLORIDE 0.9 % IV SOLN
INTRAVENOUS | Status: DC
  Administered 2017-07-03: 10:00:00 via INTRAVENOUS

## 2017-07-03 MED ORDER — PROMETHAZINE HCL 25 MG/ML IJ SOLN
12.5000 mg | Freq: Once | INTRAMUSCULAR | Status: AC
  Administered 2017-07-03: 12.5 mg via INTRAVENOUS
  Filled 2017-07-03: qty 1

## 2017-07-03 MED ORDER — FENTANYL CITRATE (PF) 100 MCG/2ML IJ SOLN
50.0000 ug | INTRAMUSCULAR | Status: AC | PRN
  Administered 2017-07-03 (×2): 50 ug via INTRAVENOUS
  Filled 2017-07-03 (×2): qty 2

## 2017-07-03 MED ORDER — MORPHINE SULFATE (PF) 4 MG/ML IV SOLN
4.0000 mg | INTRAVENOUS | Status: AC | PRN
  Administered 2017-07-03 (×2): 4 mg via INTRAVENOUS
  Filled 2017-07-03 (×2): qty 1

## 2017-07-03 MED ORDER — ONDANSETRON HCL 4 MG PO TABS: 4.0000 mg | ORAL_TABLET | Freq: Four times a day (QID) | ORAL | Status: DC | PRN

## 2017-07-03 NOTE — ED Triage Notes (Addendum)
Pt c/o abd pain, bloating, and nausea since last night.  LBM was 2 days ago.

## 2017-07-03 NOTE — H&P (Signed)
History and Physical  ZACKARI RUANE ZOX:[id number] DOB: 29-Dec-1970 DOA: 07/03/2017   PCP: System, Provider Not In   Patient coming from: Home  Chief Complaint: abdominal pain with N/V  HPI:  Peter [name] is a 46 y.o. 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  Last BM on 07/01/17. The patient was recently admitted to the hospital from 02/08/2017 through 02/10/2017 for small bowel obstruction which improved with nonsurgical management. The patient also had lysis of adhesions in November 2017 and required a prolonged hospitalization for return of bowel function at that time.He has required several admissions for pSBO and then underwent extensive LOA and reduction of peristomal hernia on 08/16/2016.  Patient denies fevers, chills, headache, chest pain, dyspnea, dysuria, hematuria, hematochezia, and melena.  The patient states that he has not had any recent changes in his diet or changes in his medications. In the emergency department, the patient was afebrile and hemodynamically stable saturating 95% on room air. Patient had WBC 3.6, hemoglobin 12.3, platelets 120,000. BMP shows serum creatinine 2.07 which is slightly above his baseline. Acute abdominal series consistent with ileus versus distal colonic obstruction.  Dr. Richrd Prime discussed with general surgery, Dr. Lovell Sheehan, who felt patient did not need to be transferred as he is stable and the plan is for conservative management.     Assessment/Plan: Ileus versus colonic obstruction - secondary to adhesions -Unfortunately, CT is not available presently due to technical difficulties -CT abd/pelvis when available -Case was discussed with general surgery, Dr. Lovell Sheehan nonoperative management -Dr. Lovell Sheehan will see patient in consult -will place NG  tube if he continues to vomit -IVF -pain control -antiemetics -am AXR  Recurrent lower extremity DVT -start IV heparin until patient able to tolerate po -previous thrombophilia work up negative -IVC filter retrieval planned 08/04/17  Dehydration -Secondary to vomiting -Continue IV fluids  CKD stage III -Baseline creatinine 1.6-1.8 -Serum creatinine peaked at 2.07  Chronic pancreatitis -Resume Creon when able to tolerate po  Chronic pain syndrome -Patient is normally on Percocet 5/325 one to 2 tablets every 6 hours when necessary -IV morphine until able to tolerate po  Chronic daily headache -resume topamax when able to tolerate po  Pancytopenia -Patient follows hematology at [facility] -WBC, hemoglobin, platelets are stable          Past Medical History:  Diagnosis Date  . Bowel obstruction (HCC)   . Chronic abdominal pain   . Chronic back pain   . CKD (chronic kidney disease)   . DVT (deep venous thrombosis) (HCC)    lower extremity  . Hypertension   . Ileus (HCC) 01/03/2012  . Kidney stones   . Migraine headache   . Pain management   . Pancreatitis   . Pancytopenia (HCC)   . Retroperitoneal bleed 01/04/2012   Past Surgical History:  Procedure Laterality Date  . chronic pancreatitiis    . COLON RESECTION    . COLOSTOMY REVERSAL    . HERNIA REPAIR    . IVC FILTER PLACEMENT ([facility] HX)    . LITHOTRIPSY    . Mucous Fistula     2013  . PANCREAS SURGERY     half of pancreas removed  . PANCREATITIC FISTULA    . SBO     Social History:  reports that he has never smoked. He has never used  smokeless tobacco. He reports that he does not drink alcohol or use drugs.   Family History  Problem Relation Age of Onset  . Colon cancer Neg Hx      Allergies  Allergen Reactions  . Metrizamide Anaphylaxis    Patient is allergic to IV contrast per Dr Rolland Porter.  . Ace Inhibitors Swelling    Lip swelling  . Coconut Flavor Swelling  . Contrast Media  [Iodinated Diagnostic Agents]     Patient is allergic to IV contrast per Dr Rolland Porter.  . Iodine     Patient experienced shortness of breath and chest tightness. Albuterol and benadryl were required.     Prior to Admission medications   Medication Sig Start Date End Date Taking? Authorizing Provider  amitriptyline (ELAVIL) 150 MG tablet Take 150 mg by mouth at bedtime.  11/30/14  Yes [provider]  Cholecalciferol (VITAMIN D PO) Take 1 tablet by mouth daily.   Yes [provider]  CREON 36000 UNITS CPEP capsule Take 36,000-108,000 Units by mouth 3 (three) times daily with meals. 1 with snacks and 3 with meals 12/11/14  Yes [provider]  docusate sodium (COLACE) 100 MG capsule Take 100 mg by mouth daily. For constipation 12/19/14  Yes [provider]  ferrous sulfate 325 (65 FE) MG tablet Take 325 mg by mouth daily.   Yes [provider]  folic acid (FOLVITE) 1 MG tablet Take 1 mg by mouth daily. 04/13/15  Yes [provider]  magnesium oxide (MAG-OX) 400 (241.3 MG) MG tablet Take 1 tablet by mouth 2 (two) times daily. 01/13/15  Yes [provider]  Multiple Vitamin (MULTIVITAMIN WITH MINERALS) TABS tablet Take 1 tablet by mouth daily.   Yes [provider]  oxyCODONE (OXY IR/ROXICODONE) 5 MG immediate release tablet Take 5 mg by mouth 2 (two) times daily as needed for moderate pain or severe pain.  10/10/16  Yes [provider]  oxyCODONE-acetaminophen (PERCOCET/ROXICET) 5-325 MG tablet 1 or 2 tabs PO q6h prn pain Patient taking differently: Take 1-2 tablets by mouth every 6 (six) hours as needed.  11/07/16  Yes [name], DO  polyethylene glycol (MIRALAX / GLYCOLAX) packet Take 17 g by mouth daily as needed for mild constipation. 02/10/17  Yes Erick Blinks, MD  rivaroxaban (XARELTO) 10 MG TABS tablet Take 10 mg by mouth every morning. 10/30/16  Yes [provider]  sildenafil (REVATIO) 20 MG tablet  Take 20 mg by mouth as needed (1 hour prior to sexual activity).  08/10/16  Yes [provider]  topiramate (TOPAMAX) 50 MG tablet Take 50-100 mg by mouth 2 (two) times daily. 1 tablet in the Am and 2 tablets at night   Yes [provider]  vitamin B-12 (CYANOCOBALAMIN) 1000 MCG tablet Take 1,000 mcg by mouth every Monday, Wednesday, and Friday.    Yes [provider]    Review of Systems:  Constitutional:  No weight loss, night sweats, Fevers, chills, fatigue.  Head&Eyes: No headache.  No vision loss.  No eye pain or scotoma ENT:  No Difficulty swallowing,Tooth/dental problems,Sore throat,  No ear ache, post nasal drip,  Cardio-vascular:  No chest pain, Orthopnea, PND, swelling in lower extremities,  dizziness, palpitations  GI:  No , diarrhea, loss of appetite, hematochezia, melena, heartburn, indigestion, Resp:  No shortness of breath with exertion or at rest. No cough. No coughing up of blood .No wheezing.No chest wall deformity  Skin:  no rash or lesions.  GU:  no dysuria, change in color of urine, no urgency or frequency. No flank pain.  Musculoskeletal:  No joint pain or swelling. No decreased range of motion. No back pain.  Psych:  No change in mood or affect. No depression or anxiety. Neurologic: No headache, no dysesthesia, no focal weakness, no vision loss. No syncope  Physical Exam: Vitals:   07/03/17 1000 07/03/17 1030 07/03/17 1100 07/03/17 1130  BP: 134/86 122/83 127/70 133/70  Pulse: 94 95 97 95  Resp:      Temp:      TempSrc:      SpO2: 95% 95% 94% 94%  Weight:      Height:       General:  A&O x 3, NAD, nontoxic, pleasant/cooperative Head/Eye: No conjunctival hemorrhage, no icterus, [place]/AT, No nystagmus ENT:  No icterus,  No thrush, good dentition, no pharyngeal exudate Neck:  No masses, no lymphadenpathy, no bruits CV:  RRR, no rub, no gallop, no S3 Lung:  CTAB, good air movement, no wheeze, no rhonchi Abdomen: soft/diffusely  tender, +BS, nondistended, no peritoneal signs Ext: No cyanosis, No rashes, No petechiae, No lymphangitis, No edema Neuro: CNII-XII intact, strength 4/5 in bilateral upper and lower extremities, no dysmetria  Labs on Admission:  Basic Metabolic Panel:  Recent Labs Lab 07/03/17 0811  NA 135  K 4.1  CL 100*  CO2 25  GLUCOSE 127*  BUN 17  CREATININE 2.07*  CALCIUM 9.6   Liver Function Tests:  Recent Labs Lab 07/03/17 0811  AST 31  ALT 45  ALKPHOS 84  BILITOT 0.5  PROT 8.3*  ALBUMIN 4.5    Recent Labs Lab 07/03/17 0811  LIPASE 23   No results for input(s): AMMONIA in the last 168 hours. CBC:  Recent Labs Lab 07/03/17 0811  WBC 3.6*  NEUTROABS 2.7  HGB 12.3*  HCT 39.0  MCV 85.2  PLT 120*   Coagulation Profile: No results for input(s): INR, PROTIME in the last 168 hours. Cardiac Enzymes: No results for input(s): CKTOTAL, CKMB, CKMBINDEX, TROPONINI in the last 168 hours. BNP: Invalid input(s): POCBNP CBG: No results for input(s): GLUCAP in the last 168 hours. Urine analysis:    Component Value Date/Time   COLORURINE YELLOW 07/03/2017 0804   APPEARANCEUR CLEAR 07/03/2017 0804   LABSPEC 1.018 07/03/2017 0804   PHURINE 6.0 07/03/2017 0804   GLUCOSEU NEGATIVE 07/03/2017 0804   HGBUR NEGATIVE 07/03/2017 0804   BILIRUBINUR NEGATIVE 07/03/2017 0804   KETONESUR NEGATIVE 07/03/2017 0804   PROTEINUR NEGATIVE 07/03/2017 0804   UROBILINOGEN 0.2 07/27/2015 2300   NITRITE NEGATIVE 07/03/2017 0804   LEUKOCYTESUR NEGATIVE 07/03/2017 0804   Sepsis Labs: (procalcitonin:4,lacticidven:4) )No results found for this or any previous visit (from the past 240 hour(s)).   Radiological Exams on Admission: Dg Abd Acute W/chest  Result Date: 07/03/2017 CLINICAL DATA:  Generalized abdominal pain and nausea and diarrhea since last night. History of partial pancreatectomy, small-bowel obstruction, colonic resection and colostomy creation. EXAM: DG ABDOMEN ACUTE W/  1V CHEST COMPARISON:  Abdominal radiograph of Feb 09, 2017 FINDINGS: The lungs are adequately inflated. There is left basilar atelectasis superimposed upon scarring. The heart and pulmonary vascularity are normal. There is no significant pleural effusion. Within the abdomen there is a small amount of air within the stomach. There is moderate distention of small and large bowel loops. There is an ostomy appliance over the right lower abdomen. No definite free extraluminal gas collections are observed. There is an inferior vena caval filter present.  The bony structures exhibit no acute abnormalities. IMPRESSION: Findings compatible with an ileus or distal colonic obstruction. No evidence of perforation. Abdominal and pelvic CT scanning would be a useful next imaging step. Left basilar atelectasis with underlying scarring. Electronically Signed   By: Sanuel Ladnier  [place] M.D.   On: 07/03/2017 08:53        Time spent:60 minutes Code Status:   FULL Family Communication:  No Family at bedside Disposition Plan: expect 2-3 day hospitalization Consults called: general surgery DVT Prophylaxis: IV Heparin     Jewelene Mairena, DO  [facility] Pager [phone]  If 7PM-7AM, please contact night-coverage [email or web address] Password [email or web address] 07/03/2017, 11:50 AM

## 2017-07-03 NOTE — ED Notes (Signed)
Pt drinking contrast.   Vomited times one.  Continues to c/o nausea.

## 2017-07-03 NOTE — Progress Notes (Signed)
ANTICOAGULATION CONSULT NOTE -   Pharmacy Consult for Heparin Indication: DVT  Allergies  Allergen Reactions  . Metrizamide Anaphylaxis    Patient is allergic to IV contrast per Dr Rolland Porter.  . Ace Inhibitors Swelling    Lip swelling  . Coconut Flavor Swelling  . Contrast Media [Iodinated Diagnostic Agents]     Patient is allergic to IV contrast per Dr Rolland Porter.  . Iodine     Patient experienced shortness of breath and chest tightness. Albuterol and benadryl were required.    Patient Measurements: Height:  (177.8 cm) Weight: 270 lb (122.5 kg) IBW/kg (Calculated) : 73 HEPARIN DW (KG): 100.6  Vital Signs: Temp: 98.5 F (36.9 C) (09/26 1345) Temp Source: Oral (09/26 1345) BP: 148/87 (09/26 1345) Pulse Rate: 97 (09/26 1345)  Labs:  Recent Labs  07/03/17 0811 07/03/17 1358 07/03/17 2019  HGB 12.3* 12.4*  --   HCT 39.0 38.5*  --   PLT 120* 109*  --   APTT  --  25 35  HEPARINUNFRC  --  0.39 0.19*  CREATININE 2.07*  --   --     Estimated Creatinine Clearance: 57.3 mL/min (A) (by C-G formula based on SCr of 2.07 mg/dL (H)).   Medical History: Past Medical History:  Diagnosis Date  . Bowel obstruction (HCC)   . Chronic abdominal pain   . Chronic back pain   . CKD (chronic kidney disease)   . DVT (deep venous thrombosis) (HCC)    lower extremity  . Hypertension   . Ileus (HCC) 01/03/2012  . Kidney stones   . Migraine headache   . Pain management   . Pancreatitis   . Pancytopenia (HCC)   . Retroperitoneal bleed 01/04/2012    Medications:  Prescriptions Prior to Admission  Medication Sig Dispense Refill Last Dose  . amitriptyline (ELAVIL) 150 MG tablet Take 150 mg by mouth at bedtime.    07/02/2017 at Unknown time  . Cholecalciferol (VITAMIN D PO) Take 1 tablet by mouth daily.   07/02/2017 at Unknown time  . CREON 36000 UNITS CPEP capsule Take 36,000-108,000 Units by mouth 3 (three) times daily with meals. 1 with snacks and 3 with meals   07/02/2017 at  Unknown time  . docusate sodium (COLACE) 100 MG capsule Take 100 mg by mouth daily. For constipation   07/02/2017 at Unknown time  . ferrous sulfate 325 (65 FE) MG tablet Take 325 mg by mouth daily.   07/02/2017 at Unknown time  . folic acid (FOLVITE) 1 MG tablet Take 1 mg by mouth daily.   07/02/2017 at Unknown time  . magnesium oxide (MAG-OX) 400 (241.3 MG) MG tablet Take 1 tablet by mouth 2 (two) times daily.   07/02/2017 at Unknown time  . Multiple Vitamin (MULTIVITAMIN WITH MINERALS) TABS tablet Take 1 tablet by mouth daily.   07/02/2017 at Unknown time  . oxyCODONE (OXY IR/ROXICODONE) 5 MG immediate release tablet Take 5 mg by mouth 2 (two) times daily as needed for moderate pain or severe pain.    07/02/2017 at Unknown time  . oxyCODONE-acetaminophen (PERCOCET/ROXICET) 5-325 MG tablet 1 or 2 tabs PO q6h prn pain (Patient taking differently: Take 1-2 tablets by mouth every 6 (six) hours as needed. ) 15 tablet 0 uunknown  . polyethylene glycol (MIRALAX / GLYCOLAX) packet Take 17 g by mouth daily as needed for mild constipation. 30 each 0 unknown  . rivaroxaban (XARELTO) 10 MG TABS tablet Take 10 mg by mouth every morning.  07/02/2017 at 0800  . sildenafil (REVATIO) 20 MG tablet Take 20 mg by mouth as needed (1 hour prior to sexual activity).    unknown  . topiramate (TOPAMAX) 50 MG tablet Take 50-100 mg by mouth 2 (two) times daily. 1 tablet in the Am and 2 tablets at night   07/02/2017 at Unknown time  . vitamin B-12 (CYANOCOBALAMIN) 1000 MCG tablet Take 1,000 mcg by mouth every Monday, Wednesday, and Friday.    Past Week at Unknown time    Assessment: 46 y.o. 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 Acute abdominal series consistent with ileus versus distal colonic obstruction. Plan is to continue  anticoagulation treatment with heparin until patient able to tolerate po. Last dose of Xarelto 9/25 18 in AM. Will not give bolus. Monitor APTT and HL to ensure no residual effect from xarelto. HL and APTT are subtherapeutic  Goal of Therapy:  Heparin level 0.3-0.7 units/ml aPTT 66-102 seconds Monitor platelets by anticoagulation protocol: Yes   Plan:  Heparin bolus 2500 units Increase heparin infusion  1950 units/hr Check anti-Xa level in 6-8 hours and daily while on heparin Continue to monitor H&H and platelets  Elder Cyphers, BS Loura Back, BCPS Clinical Pharmacist Pager [phone] 07/03/2017,9:48 PM

## 2017-07-03 NOTE — ED Notes (Signed)
Pt vomited. edp aware

## 2017-07-03 NOTE — Progress Notes (Signed)
ANTICOAGULATION CONSULT NOTE - Initial Consult  Pharmacy Consult for Heparin Indication: DVT  Allergies  Allergen Reactions  . Metrizamide Anaphylaxis    Patient is allergic to IV contrast per Dr Rolland Porter.  . Ace Inhibitors Swelling    Lip swelling  . Coconut Flavor Swelling  . Contrast Media [Iodinated Diagnostic Agents]     Patient is allergic to IV contrast per Dr Rolland Porter.  . Iodine     Patient experienced shortness of breath and chest tightness. Albuterol and benadryl were required.    Patient Measurements: Height:  (180.3 cm) Weight: 274 lb 6.4 oz (124.5 kg) IBW/kg (Calculated) : 75.3 HEPARIN DW (KG): 103.2  Vital Signs: Temp: 97.8 F (36.6 C) (09/26 1324) Temp Source: Axillary (09/26 1324) BP: 144/86 (09/26 1324) Pulse Rate: 97 (09/26 1324)  Labs:  Recent Labs  07/03/17 0811  HGB 12.3*  HCT 39.0  PLT 120*  CREATININE 2.07*    Estimated Creatinine Clearance: 58.6 mL/min (A) (by C-G formula based on SCr of 2.07 mg/dL (H)).   Medical History: Past Medical History:  Diagnosis Date  . Bowel obstruction (HCC)   . Chronic abdominal pain   . Chronic back pain   . CKD (chronic kidney disease)   . DVT (deep venous thrombosis) (HCC)    lower extremity  . Hypertension   . Ileus (HCC) 01/03/2012  . Kidney stones   . Migraine headache   . Pain management   . Pancreatitis   . Pancytopenia (HCC)   . Retroperitoneal bleed 01/04/2012    Medications:  Prescriptions Prior to Admission  Medication Sig Dispense Refill Last Dose  . amitriptyline (ELAVIL) 150 MG tablet Take 150 mg by mouth at bedtime.    07/02/2017 at Unknown time  . Cholecalciferol (VITAMIN D PO) Take 1 tablet by mouth daily.   07/02/2017 at Unknown time  . CREON 36000 UNITS CPEP capsule Take 36,000-108,000 Units by mouth 3 (three) times daily with meals. 1 with snacks and 3 with meals   07/02/2017 at Unknown time  . docusate sodium (COLACE) 100 MG capsule Take 100 mg by mouth daily. For  constipation   07/02/2017 at Unknown time  . ferrous sulfate 325 (65 FE) MG tablet Take 325 mg by mouth daily.   07/02/2017 at Unknown time  . folic acid (FOLVITE) 1 MG tablet Take 1 mg by mouth daily.   07/02/2017 at Unknown time  . magnesium oxide (MAG-OX) 400 (241.3 MG) MG tablet Take 1 tablet by mouth 2 (two) times daily.   07/02/2017 at Unknown time  . Multiple Vitamin (MULTIVITAMIN WITH MINERALS) TABS tablet Take 1 tablet by mouth daily.   07/02/2017 at Unknown time  . oxyCODONE (OXY IR/ROXICODONE) 5 MG immediate release tablet Take 5 mg by mouth 2 (two) times daily as needed for moderate pain or severe pain.    07/02/2017 at Unknown time  . oxyCODONE-acetaminophen (PERCOCET/ROXICET) 5-325 MG tablet 1 or 2 tabs PO q6h prn pain (Patient taking differently: Take 1-2 tablets by mouth every 6 (six) hours as needed. ) 15 tablet 0 uunknown  . polyethylene glycol (MIRALAX / GLYCOLAX) packet Take 17 g by mouth daily as needed for mild constipation. 30 each 0 unknown  . rivaroxaban (XARELTO) 10 MG TABS tablet Take 10 mg by mouth every morning.   07/02/2017 at 0800  . sildenafil (REVATIO) 20 MG tablet Take 20 mg by mouth as needed (1 hour prior to sexual activity).    unknown  . topiramate (TOPAMAX) 50  MG tablet Take 50-100 mg by mouth 2 (two) times daily. 1 tablet in the Am and 2 tablets at night   07/02/2017 at Unknown time  . vitamin B-12 (CYANOCOBALAMIN) 1000 MCG tablet Take 1,000 mcg by mouth every Monday, Wednesday, and Friday.    Past Week at Unknown time    Assessment: 46 y.o. 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 Acute abdominal series consistent with ileus versus distal colonic obstruction. Plan is to continue anticoagulation treatment with heparin until patient able to tolerate po. Last dose of Xarelto 9/25  18 in AM. Will not give bolus. Monitor APTT and HL to ensure no residual effect from xarelto.  Goal of Therapy:  Heparin level 0.3-0.7 units/ml aPTT 66-102 seconds Monitor platelets by anticoagulation protocol: Yes   Plan:  Start heparin infusion at 1650 units/hr Check anti-Xa level in 6 hours and daily while on heparin Continue to monitor H&H and platelets  Elder Cyphers, BS Loura Back, BCPS Clinical Pharmacist Pager [phone] 07/03/2017,1:44 PM

## 2017-07-03 NOTE — ED Provider Notes (Signed)
[facility]Y DEPT Provider Note   CSN: [id number] Arrival date & time: 07/03/17  5621     History   Chief Complaint Chief Complaint  Patient presents with  . Abdominal Pain    HPI MATEUS REWERTS is a 46 y.o. male.  HPI Pt was seen at 0740.  Per pt, c/o gradual onset and persistence of constant generalized abd "pain" since yesterday.  Has been associated with multiple intermittent episodes of nausea.  Describes the abd pain as "bloating."  Denies change in ostomy output. Denies vomiting, no fevers, no back pain, no rash, no CP/SOB, no black or blood in stools.      Past Medical History:  Diagnosis Date  . Bowel obstruction (HCC)   . Chronic abdominal pain   . Chronic back pain   . CKD (chronic kidney disease)   . DVT (deep venous thrombosis) (HCC)    lower extremity  . Hypertension   . Ileus (HCC) 01/03/2012  . Kidney stones   . Migraine headache   . Pain management   . Pancreatitis   . Pancytopenia (HCC)   . Retroperitoneal bleed 01/04/2012    Patient Active Problem List   Diagnosis Date Noted  . AKI (acute kidney injury) (HCC) 08/15/2016  . CKD (chronic kidney disease), stage III 08/15/2016  . History of DVT (deep vein thrombosis) 08/15/2016  . Thrombocytopenia (HCC) 08/15/2016  . Generalized abdominal pain   . Small bowel obstruction (HCC)   . Pneumatosis intestinalis   . Hyperglycemia 01/05/2012  . Peripheral edema 01/04/2012  . Anemia 01/03/2012  . Metabolic acidosis 12/30/2011  . Hypocalcemia 12/30/2011  . Hypertriglyceridemia 12/30/2011  . Chronic pancreatitis (HCC) 12/29/2011  . Hepatic steatosis 12/29/2011  . HTN (hypertension) 10/15/2011  . Alcohol abuse 10/15/2011  . Obesity 10/15/2011    Past Surgical History:  Procedure Laterality Date  . chronic pancreatitiis    . COLON RESECTION    . COLOSTOMY REVERSAL    . HERNIA REPAIR    . IVC FILTER PLACEMENT ([facility] HX)    . LITHOTRIPSY    . Mucous Fistula     2013  . PANCREAS SURGERY     half  of pancreas removed  . PANCREATITIC FISTULA    . SBO         Home Medications    Prior to Admission medications   Medication Sig Start Date End Date Taking? Authorizing Provider  amitriptyline (ELAVIL) 150 MG tablet Take 150 mg by mouth at bedtime.  11/30/14   [provider]  Cholecalciferol (VITAMIN D PO) Take 1 tablet by mouth daily.    [provider]  CREON 36000 UNITS CPEP capsule Take 36,000-108,000 Units by mouth 3 (three) times daily with meals. 1 with snacks and 3 with meals 12/11/14   [provider]  docusate sodium (COLACE) 100 MG capsule Take 100 mg by mouth daily. For constipation 12/19/14   [provider]  ferrous sulfate 325 (65 FE) MG tablet Take 325 mg by mouth daily.    [provider]  folic acid (FOLVITE) 1 MG tablet Take 1 mg by mouth daily. 04/13/15   [provider]  magnesium oxide (MAG-OX) 400 (241.3 MG) MG tablet Take 1 tablet by mouth 2 (two) times daily. 01/13/15   [provider]  Multiple Vitamin (MULTIVITAMIN WITH MINERALS) TABS tablet Take 1 tablet by mouth daily.    [provider]  oxyCODONE (OXY IR/ROXICODONE) 5 MG immediate release tablet Take 5 mg by mouth 2 (two)  times daily as needed for moderate pain or severe pain.  10/10/16   [provider]  oxyCODONE-acetaminophen (PERCOCET/ROXICET) 5-325 MG tablet 1 or 2 tabs PO q6h prn pain Patient taking differently: Take 1-2 tablets by mouth every 6 (six) hours as needed.  11/07/16   [name], DO  polyethylene glycol [facility] / GLYCOLAX) packet Take 17 g by mouth daily as needed for mild constipation. 02/10/17   Erick Blinks, MD  rivaroxaban (XARELTO) 10 MG TABS tablet Take 10 mg by mouth every morning. 10/30/16   [provider]  sildenafil (REVATIO) 20 MG tablet Take 20 mg by mouth as needed (1 hour prior to sexual activity).  08/10/16   [provider]  topiramate (TOPAMAX) 50 MG tablet Take 50-100 mg by mouth  2 (two) times daily. 1 tablet in the Am and 2 tablets at night    [provider]  vitamin B-12 (CYANOCOBALAMIN) 1000 MCG tablet Take 1,000 mcg by mouth every Monday, Wednesday, and Friday.     [provider]    Family History Family History  Problem Relation Age of Onset  . Colon cancer Neg Hx     Social History Social History  Substance Use Topics  . Smoking status: Never Smoker  . Smokeless tobacco: Never Used  . Alcohol use No     Comment: former     Allergies   Metrizamide; Ace inhibitors; Coconut flavor; Contrast media [iodinated diagnostic agents]; and Iodine   Review of Systems Review of Systems ROS: Statement: All systems negative except as marked or noted in the HPI; Constitutional: Negative for fever and chills. ; ; Eyes: Negative for eye pain, redness and discharge. ; ; ENMT: Negative for ear pain, hoarseness, nasal congestion, sinus pressure and sore throat. ; ; Cardiovascular: Negative for chest pain, palpitations, diaphoresis, dyspnea and peripheral edema. ; ; Respiratory: Negative for cough, wheezing and stridor. ; ; Gastrointestinal: +nausea, abd pain. Negative for vomiting, diarrhea, blood in stool, hematemesis, jaundice and rectal bleeding. . ; ; Genitourinary: Negative for dysuria, flank pain and hematuria. ; ; Musculoskeletal: Negative for back pain and neck pain. Negative for swelling and trauma.; ; Skin: Negative for pruritus, rash, abrasions, blisters, bruising and skin lesion.; ; Neuro: Negative for headache, lightheadedness and neck stiffness. Negative for weakness, altered level of consciousness, altered mental status, extremity weakness, paresthesias, involuntary movement, seizure and syncope.       Physical Exam Updated Vital Signs BP (!) 129/95   Pulse 95   Temp 98 F (36.7 C) (Oral)   Resp 18   Ht  (1.778 m)   Wt 117.9 kg (260 lb)   SpO2 95%   BMI 37.31 kg/m   Physical Exam 0745: Physical examination:  Nursing notes  reviewed; Vital signs and O2 SAT reviewed;  Constitutional: Well developed, Well nourished, Well hydrated, In no acute distress; Head:  Normocephalic, atraumatic; Eyes: EOMI, PERRL, No scleral icterus; ENMT: Mouth and pharynx normal, Mucous membranes moist; Neck: Supple, Full range of motion, No lymphadenopathy; Cardiovascular: Regular rate and rhythm, No gallop; Respiratory: Breath sounds clear & equal bilaterally, No wheezes.  Speaking full sentences with ease, Normal respiratory effort/excursion; Chest: Nontender, Movement normal; Abdomen:  +diffuse tenderness to palp, +softly distended, Normal bowel sounds; Genitourinary: No CVA tenderness; Extremities: Pulses normal, No tenderness, No edema, No calf edema or asymmetry.; Neuro: AA&Ox3, Major CN grossly intact.  Speech clear. No gross focal motor or sensory deficits in extremities.; Skin: Color normal, Warm, Dry.   ED Treatments /  Results  Labs (all labs ordered are listed, but only abnormal results are displayed)   EKG  EKG Interpretation None       Radiology   Procedures Procedures (including critical care time)  Medications Ordered in ED Medications  ondansetron (ZOFRAN) injection 4 mg (4 mg Intravenous Given 07/03/17 0819)  fentaNYL (SUBLIMAZE) injection 50 mcg (50 mcg Intravenous Given 07/03/17 0819)     Initial Impression / Assessment and Plan / ED Course  I have reviewed the triage vital signs and the nursing notes.  Pertinent labs & imaging results that were available during my care of the patient were reviewed by me and considered in my medical decision making (see chart for details).  MDM Reviewed: previous chart, nursing note and vitals Reviewed previous: labs Interpretation: labs, x-ray and CT scan   Results for orders placed or performed during the hospital encounter of 07/03/17  Comprehensive metabolic panel  Result Value Ref Range   Sodium 135 135 - 145 mmol/L   Potassium 4.1 3.5 - 5.1 mmol/L   Chloride 100  (L) 101 - 111 mmol/L   CO2 25 22 - 32 mmol/L   Glucose, Bld 127 (H) 65 - 99 mg/dL   BUN 17 6 - 20 mg/dL   Creatinine, Ser 1.61 (H) 0.61 - 1.24 mg/dL   Calcium 9.6 8.9 - 09.6 mg/dL   Total Protein 8.3 (H) 6.5 - 8.1 g/dL   Albumin 4.5 3.5 - 5.0 g/dL   AST 31 15 - 41 U/L   ALT 45 17 - 63 U/L   Alkaline Phosphatase 84 38 - 126 U/L   Total Bilirubin 0.5 0.3 - 1.2 mg/dL   GFR calc non Af Amer 36 (L) >60 mL/min   GFR calc Af Amer 42 (L) >60 mL/min   Anion gap 10 5 - 15  Lipase, blood  Result Value Ref Range   Lipase 23 11 - 51 U/L  CBC with Differential  Result Value Ref Range   WBC 3.6 (L) 4.0 - 10.5 K/uL   RBC 4.58 4.22 - 5.81 MIL/uL   Hemoglobin 12.3 (L) 13.0 - 17.0 g/dL   HCT 04.5 40.9 - 81.1 %   MCV 85.2 78.0 - 100.0 fL   MCH 26.9 26.0 - 34.0 pg   MCHC 31.5 30.0 - 36.0 g/dL   RDW 91.4 78.2 - 95.6 %   Platelets 120 (L) 150 - 400 K/uL   Neutrophils Relative % 75 %   Neutro Abs 2.7 1.7 - 7.7 K/uL   Lymphocytes Relative 18 %   Lymphs Abs 0.6 (L) 0.7 - 4.0 K/uL   Monocytes Relative 6 %   Monocytes Absolute 0.2 0.1 - 1.0 K/uL   Eosinophils Relative 1 %   Eosinophils Absolute 0.0 0.0 - 0.7 K/uL   Basophils Relative 0 %   Basophils Absolute 0.0 0.0 - 0.1 K/uL  Urinalysis, Routine w reflex microscopic  Result Value Ref Range   Color, Urine YELLOW YELLOW   APPearance CLEAR CLEAR   Specific Gravity, Urine 1.018 1.005 - 1.030   pH 6.0 5.0 - 8.0   Glucose, UA NEGATIVE NEGATIVE mg/dL   Hgb urine dipstick NEGATIVE NEGATIVE   Bilirubin Urine NEGATIVE NEGATIVE   Ketones, ur NEGATIVE NEGATIVE mg/dL   Protein, ur NEGATIVE NEGATIVE mg/dL   Nitrite NEGATIVE NEGATIVE   Leukocytes, UA NEGATIVE NEGATIVE   Dg Abd Acute W/chest Result Date: 07/03/2017 CLINICAL DATA:  Generalized abdominal pain and nausea and diarrhea since last night. History of partial pancreatectomy, small-bowel  obstruction, colonic resection and colostomy creation. EXAM: DG ABDOMEN ACUTE W/ 1V CHEST COMPARISON:   Abdominal radiograph of Feb 09, 2017 FINDINGS: The lungs are adequately inflated. There is left basilar atelectasis superimposed upon scarring. The heart and pulmonary vascularity are normal. There is no significant pleural effusion. Within the abdomen there is a small amount of air within the stomach. There is moderate distention of small and large bowel loops. There is an ostomy appliance over the right lower abdomen. No definite free extraluminal gas collections are observed. There is an inferior vena caval filter present. The bony structures exhibit no acute abnormalities. IMPRESSION: Findings compatible with an ileus or distal colonic obstruction. No evidence of perforation. Abdominal and pelvic CT scanning would be a useful next imaging step. Left basilar atelectasis with underlying scarring. Electronically Signed   By: David  [place] M.D.   On: 07/03/2017 08:53    1110:  AXR as above. No CT scan available. Pt has had admit for same symptoms 4 months ago, tx conservatively with improvement.  T/C to General Surgeon Dr. Lovell Sheehan, case discussed, including:  HPI, pertinent PM/SHx, VS/PE, dx testing, ED course and treatment:  Agreeable to consult, requests to be aware that if pt worsens, he will need transfer to [facility] (where he had previous surgeries).  1130:  T/C to [facility] Dr. Arbutus Leas, case discussed, including:  HPI, pertinent PM/SHx, VS/PE, dx testing, ED course and treatment:  Agreeable to admit.   Final Clinical Impressions(s) / ED Diagnoses   Final diagnoses:  None    New Prescriptions New Prescriptions   No medications on file     [name], DO 07/06/17 2306

## 2017-07-04 ENCOUNTER — Inpatient Hospital Stay (HOSPITAL_COMMUNITY): Payer: Medicare Other

## 2017-07-04 DIAGNOSIS — K5669 Other partial intestinal obstruction: Secondary | ICD-10-CM

## 2017-07-04 DIAGNOSIS — K5652 Intestinal adhesions [bands] with complete obstruction: Secondary | ICD-10-CM

## 2017-07-04 DIAGNOSIS — R112 Nausea with vomiting, unspecified: Secondary | ICD-10-CM

## 2017-07-04 LAB — CBC
HEMATOCRIT: 38.7 % — AB (ref 39.0–52.0)
HEMOGLOBIN: 12.4 g/dL — AB (ref 13.0–17.0)
MCH: 27.4 pg (ref 26.0–34.0)
MCHC: 32 g/dL (ref 30.0–36.0)
MCV: 85.6 fL (ref 78.0–100.0)
Platelets: 116 10*3/uL — ABNORMAL LOW (ref 150–400)
RBC: 4.52 MIL/uL (ref 4.22–5.81)
RDW: 15.6 % — ABNORMAL HIGH (ref 11.5–15.5)
WBC: 4.1 10*3/uL (ref 4.0–10.5)

## 2017-07-04 LAB — HEPARIN LEVEL (UNFRACTIONATED): HEPARIN UNFRACTIONATED: 0.9 [IU]/mL — AB (ref 0.30–0.70)

## 2017-07-04 LAB — BASIC METABOLIC PANEL
ANION GAP: 9 (ref 5–15)
BUN: 17 mg/dL (ref 6–20)
CHLORIDE: 103 mmol/L (ref 101–111)
CO2: 24 mmol/L (ref 22–32)
Calcium: 8.9 mg/dL (ref 8.9–10.3)
Creatinine, Ser: 1.69 mg/dL — ABNORMAL HIGH (ref 0.61–1.24)
GFR calc Af Amer: 54 mL/min — ABNORMAL LOW (ref >60)
GFR, EST NON AFRICAN AMERICAN: 46 mL/min — AB (ref >60)
Glucose, Bld: 129 mg/dL — ABNORMAL HIGH (ref 65–99)
POTASSIUM: 4.4 mmol/L (ref 3.5–5.1)
Sodium: 136 mmol/L (ref 135–145)

## 2017-07-04 LAB — APTT: APTT: 136 s — AB (ref 24–36)

## 2017-07-04 LAB — PROTIME-INR
INR: 1.15
Prothrombin Time: 14.6 seconds (ref 11.4–15.2)

## 2017-07-04 LAB — MAGNESIUM: Magnesium: 1.8 mg/dL (ref 1.7–2.4)

## 2017-07-04 MED ORDER — MORPHINE SULFATE (PF) 4 MG/ML IV SOLN: 4.0000 mg | INTRAVENOUS | 0 refills | Status: DC | PRN

## 2017-07-04 MED ORDER — HEPARIN (PORCINE) IN NACL 100-0.45 UNIT/ML-% IJ SOLN: 1750.0000 [IU]/h | INTRAMUSCULAR | 0 refills | Status: DC

## 2017-07-04 MED ORDER — PROMETHAZINE HCL 25 MG/ML IJ SOLN: 12.5000 mg | Freq: Four times a day (QID) | INTRAMUSCULAR | Status: DC | PRN

## 2017-07-04 NOTE — Progress Notes (Signed)
ANTICOAGULATION CONSULT NOTE -   Pharmacy Consult for Heparin Indication: DVT  Allergies  Allergen Reactions  . Metrizamide Anaphylaxis    Patient is allergic to IV contrast per Dr Rolland Porter.  . Ace Inhibitors Swelling    Lip swelling  . Coconut Flavor Swelling  . Contrast Media [Iodinated Diagnostic Agents]     Patient is allergic to IV contrast per Dr Rolland Porter.  . Iodine     Patient experienced shortness of breath and chest tightness. Albuterol and benadryl were required.    Patient Measurements: Height:  (177.8 cm) Weight: 270 lb (122.5 kg) IBW/kg (Calculated) : 73 HEPARIN DW (KG): 100.6  Vital Signs: Temp: 98.4 F (36.9 C) (09/27 0450) Temp Source: Oral (09/27 0450) BP: 139/89 (09/27 0450) Pulse Rate: 95 (09/27 0450)  Labs:  Recent Labs  07/03/17 0811 07/03/17 1358 07/03/17 2019 07/04/17 0440  HGB 12.3* 12.4*  --  12.4*  HCT 39.0 38.5*  --  38.7*  PLT 120* 109*  --  116*  APTT  --  25 35 136*  LABPROT  --   --   --  14.6  INR  --   --   --  1.15  HEPARINUNFRC  --  0.39 0.19* 0.90*  CREATININE 2.07*  --   --  1.69*    Estimated Creatinine Clearance: 70.2 mL/min (A) (by C-G formula based on SCr of 1.69 mg/dL (H)).   Medical History: Past Medical History:  Diagnosis Date  . Bowel obstruction (HCC)   . Chronic abdominal pain   . Chronic back pain   . CKD (chronic kidney disease)   . DVT (deep venous thrombosis) (HCC)    lower extremity  . Hypertension   . Ileus (HCC) 01/03/2012  . Kidney stones   . Migraine headache   . Pain management   . Pancreatitis   . Pancytopenia (HCC)   . Retroperitoneal bleed 01/04/2012    Medications:  Prescriptions Prior to Admission  Medication Sig Dispense Refill Last Dose  . amitriptyline (ELAVIL) 150 MG tablet Take 150 mg by mouth at bedtime.    07/02/2017 at Unknown time  . Cholecalciferol (VITAMIN D PO) Take 1 tablet by mouth daily.   07/02/2017 at Unknown time  . CREON 36000 UNITS CPEP capsule Take  36,000-108,000 Units by mouth 3 (three) times daily with meals. 1 with snacks and 3 with meals   07/02/2017 at Unknown time  . docusate sodium (COLACE) 100 MG capsule Take 100 mg by mouth daily. For constipation   07/02/2017 at Unknown time  . ferrous sulfate 325 (65 FE) MG tablet Take 325 mg by mouth daily.   07/02/2017 at Unknown time  . folic acid (FOLVITE) 1 MG tablet Take 1 mg by mouth daily.   07/02/2017 at Unknown time  . magnesium oxide (MAG-OX) 400 (241.3 MG) MG tablet Take 1 tablet by mouth 2 (two) times daily.   07/02/2017 at Unknown time  . Multiple Vitamin (MULTIVITAMIN WITH MINERALS) TABS tablet Take 1 tablet by mouth daily.   07/02/2017 at Unknown time  . oxyCODONE (OXY IR/ROXICODONE) 5 MG immediate release tablet Take 5 mg by mouth 2 (two) times daily as needed for moderate pain or severe pain.    07/02/2017 at Unknown time  . oxyCODONE-acetaminophen (PERCOCET/ROXICET) 5-325 MG tablet 1 or 2 tabs PO q6h prn pain (Patient taking differently: Take 1-2 tablets by mouth every 6 (six) hours as needed. ) 15 tablet 0 uunknown  . polyethylene glycol (MIRALAX / GLYCOLAX)  packet Take 17 g by mouth daily as needed for mild constipation. 30 each 0 unknown  . rivaroxaban (XARELTO) 10 MG TABS tablet Take 10 mg by mouth every morning.   07/02/2017 at 0800  . sildenafil (REVATIO) 20 MG tablet Take 20 mg by mouth as needed (1 hour prior to sexual activity).    unknown  . topiramate (TOPAMAX) 50 MG tablet Take 50-100 mg by mouth 2 (two) times daily. 1 tablet in the Am and 2 tablets at night   07/02/2017 at Unknown time  . vitamin B-12 (CYANOCOBALAMIN) 1000 MCG tablet Take 1,000 mcg by mouth every Monday, Wednesday, and Friday.    Past Week at Unknown time    Assessment: 46 y.o. 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  Acute abdominal series consistent with ileus versus distal colonic obstruction. Plan is to continue anticoagulation treatment with heparin until patient able to tolerate po. Last dose of Xarelto 9/25 18 in AM. Will not give bolus. Monitor APTT and HL to ensure no residual effect from xarelto. Appears the APTT and HL are correlating, so will just follow heparin level. HL and APTT are supratherapeutic  Goal of Therapy:  Heparin level 0.3-0.7 units/ml Monitor platelets by anticoagulation protocol: Yes   Plan:  Decrease heparin infusion  1750 units/hr Check anti-Xa level in 6-8 hours and daily while on heparin Continue to monitor H&H and platelets  Elder Cyphers, BS Loura Back, BCPS Clinical Pharmacist Pager [phone] 07/04/2017,7:55 AM

## 2017-07-04 NOTE — Consult Note (Signed)
Reason for Consult: Nausea and vomiting Referring Physician: Dr. Hillery Hunter is an 46 y.o. male.  HPI: Patient is a 46 year old black male who has an extensive surgical history in the past who presents with recurrent nausea and vomiting. He started having nausea and vomiting 2 days ago. He states his last significant bowel movement was 2 days ago. He is status post exploratory laparotomy in 2017 at [facility]. 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 He currently has a pain a 4 out of 10. A KUB showed evidence of possible bowel obstruction versus ileus. Unfortunately, the CT scan is not operable at this time. He was admitted to the hospital for further management and treatment. There was difficulty getting the NG tube in, though it is in this morning. He has had 600 mL of bilious fluid aspirated.  Past Medical History:  Diagnosis Date  . Bowel obstruction ([place])   . Chronic abdominal pain   . Chronic back pain   . CKD (chronic kidney disease)   . DVT (deep venous thrombosis) ([place])    lower extremity  . Hypertension   . Ileus ([place]) 01/03/2012  . Kidney stones   . Migraine headache   . Pain management   . Pancreatitis   . Pancytopenia ([place])   . Retroperitoneal bleed 01/04/2012    Past Surgical History:  Procedure Laterality Date  . chronic pancreatitiis    . COLON RESECTION    . COLOSTOMY REVERSAL    . HERNIA REPAIR    . IVC FILTER PLACEMENT ([facility] HX)    . LITHOTRIPSY    . Mucous Fistula     2013  . PANCREAS SURGERY     half of pancreas removed  . PANCREATITIC FISTULA    . SBO      Family History  Problem Relation Age of Onset  . Colon cancer Neg Hx     Social History:  reports that he has never smoked. He has never used smokeless tobacco. He reports that he does not drink alcohol or use drugs.  Allergies:  Allergies  Allergen Reactions  . Metrizamide Anaphylaxis    Patient is allergic to  IV contrast per Dr Tanna Furry.  . Ace Inhibitors Swelling    Lip swelling  . Coconut Flavor Swelling  . Contrast Media [Iodinated Diagnostic Agents]     Patient is allergic to IV contrast per Dr Tanna Furry.  . Iodine     Patient experienced shortness of breath and chest tightness. Albuterol and benadryl were required.    Medications:  Prior to Admission:  Prescriptions Prior to Admission  Medication Sig Dispense Refill Last Dose  . amitriptyline (ELAVIL) 150 MG tablet Take 150 mg by mouth at bedtime.    07/02/2017 at Unknown time  . Cholecalciferol (VITAMIN D PO) Take 1 tablet by mouth daily.   07/02/2017 at Unknown time  . CREON 36000 UNITS CPEP capsule Take 36,000-108,000 Units by mouth 3 (three) times daily with meals. 1 with snacks and 3 with meals   07/02/2017 at Unknown time  . docusate sodium (COLACE) 100 MG capsule Take 100 mg by mouth daily. For constipation   07/02/2017 at Unknown time  . ferrous sulfate 325 (65 FE) MG tablet Take 325 mg by mouth daily.   07/02/2017 at Unknown time  . folic acid (FOLVITE) 1 MG tablet Take 1 mg by mouth daily.   07/02/2017 at Unknown time  .  magnesium oxide (MAG-OX) 400 (241.3 MG) MG tablet Take 1 tablet by mouth 2 (two) times daily.   07/02/2017 at Unknown time  . Multiple Vitamin (MULTIVITAMIN WITH MINERALS) TABS tablet Take 1 tablet by mouth daily.   07/02/2017 at Unknown time  . oxyCODONE (OXY IR/ROXICODONE) 5 MG immediate release tablet Take 5 mg by mouth 2 (two) times daily as needed for moderate pain or severe pain.    07/02/2017 at Unknown time  . oxyCODONE-acetaminophen (PERCOCET/ROXICET) 5-325 MG tablet 1 or 2 tabs PO q6h prn pain (Patient taking differently: Take 1-2 tablets by mouth every 6 (six) hours as needed. ) 15 tablet 0 uunknown  . polyethylene glycol (MIRALAX / GLYCOLAX) packet Take 17 g by mouth daily as needed for mild constipation. 30 each 0 unknown  . rivaroxaban (XARELTO) 10 MG TABS tablet Take 10 mg by mouth every morning.    07/02/2017 at 0800  . sildenafil (REVATIO) 20 MG tablet Take 20 mg by mouth as needed (1 hour prior to sexual activity).    unknown  . topiramate (TOPAMAX) 50 MG tablet Take 50-100 mg by mouth 2 (two) times daily. 1 tablet in the Am and 2 tablets at night   07/02/2017 at Unknown time  . vitamin B-12 (CYANOCOBALAMIN) 1000 MCG tablet Take 1,000 mcg by mouth every Monday, Wednesday, and Friday.    Past Week at Unknown time    Results for orders placed or performed during the hospital encounter of 07/03/17 (from the past 48 hour(s))  Urinalysis, Routine w reflex microscopic     Status: None   Collection Time: 07/03/17  8:04 AM  Result Value Ref Range   Color, Urine YELLOW YELLOW   APPearance CLEAR CLEAR   Specific Gravity, Urine 1.018 1.005 - 1.030   pH 6.0 5.0 - 8.0   Glucose, UA NEGATIVE NEGATIVE mg/dL   Hgb urine dipstick NEGATIVE NEGATIVE   Bilirubin Urine NEGATIVE NEGATIVE   Ketones, ur NEGATIVE NEGATIVE mg/dL   Protein, ur NEGATIVE NEGATIVE mg/dL   Nitrite NEGATIVE NEGATIVE   Leukocytes, UA NEGATIVE NEGATIVE  Comprehensive metabolic panel     Status: Abnormal   Collection Time: 07/03/17  8:11 AM  Result Value Ref Range   Sodium 135 135 - 145 mmol/L   Potassium 4.1 3.5 - 5.1 mmol/L   Chloride 100 (L) 101 - 111 mmol/L   CO2 25 22 - 32 mmol/L   Glucose, Bld 127 (H) 65 - 99 mg/dL   BUN 17 6 - 20 mg/dL   Creatinine, Ser 2.07 (H) 0.61 - 1.24 mg/dL   Calcium 9.6 8.9 - 10.3 mg/dL   Total Protein 8.3 (H) 6.5 - 8.1 g/dL   Albumin 4.5 3.5 - 5.0 g/dL   AST 31 15 - 41 U/L   ALT 45 17 - 63 U/L   Alkaline Phosphatase 84 38 - 126 U/L   Total Bilirubin 0.5 0.3 - 1.2 mg/dL   GFR calc non Af Amer 36 (L) >60 mL/min   GFR calc Af Amer 42 (L) >60 mL/min    Comment: (NOTE) The eGFR has been calculated using the CKD EPI equation. This calculation has not been validated in all clinical situations. eGFR's persistently <60 mL/min signify possible Chronic Kidney Disease.    Anion gap 10 5 - 15   Lipase, blood     Status: None   Collection Time: 07/03/17  8:11 AM  Result Value Ref Range   Lipase 23 11 - 51 U/L  CBC with Differential  Status: Abnormal   Collection Time: 07/03/17  8:11 AM  Result Value Ref Range   WBC 3.6 (L) 4.0 - 10.5 K/uL   RBC 4.58 4.22 - 5.81 MIL/uL   Hemoglobin 12.3 (L) 13.0 - 17.0 g/dL   HCT 39.0 39.0 - 52.0 %   MCV 85.2 78.0 - 100.0 fL   MCH 26.9 26.0 - 34.0 pg   MCHC 31.5 30.0 - 36.0 g/dL   RDW 15.5 11.5 - 15.5 %   Platelets 120 (L) 150 - 400 K/uL   Neutrophils Relative % 75 %   Neutro Abs 2.7 1.7 - 7.7 K/uL   Lymphocytes Relative 18 %   Lymphs Abs 0.6 (L) 0.7 - 4.0 K/uL   Monocytes Relative 6 %   Monocytes Absolute 0.2 0.1 - 1.0 K/uL   Eosinophils Relative 1 %   Eosinophils Absolute 0.0 0.0 - 0.7 K/uL   Basophils Relative 0 %   Basophils Absolute 0.0 0.0 - 0.1 K/uL  Vitamin B12     Status: None   Collection Time: 07/03/17  1:58 PM  Result Value Ref Range   Vitamin B-12 278 180 - 914 pg/mL    Comment: (NOTE) This assay is not validated for testing neonatal or myeloproliferative syndrome specimens for Vitamin B12 levels. Performed at [facility], [place] [address], [place], [place] [address]   Heparin level (unfractionated)     Status: None   Collection Time: 07/03/17  1:58 PM  Result Value Ref Range   Heparin Unfractionated 0.39 0.30 - 0.70 IU/mL    Comment:        IF HEPARIN RESULTS ARE BELOW EXPECTED VALUES, AND PATIENT DOSAGE HAS BEEN CONFIRMED, SUGGEST FOLLOW UP TESTING OF ANTITHROMBIN III LEVELS.   APTT     Status: None   Collection Time: 07/03/17  1:58 PM  Result Value Ref Range   aPTT 25 24 - 36 seconds  CBC     Status: Abnormal   Collection Time: 07/03/17  1:58 PM  Result Value Ref Range   WBC 4.2 4.0 - 10.5 K/uL   RBC 4.50 4.22 - 5.81 MIL/uL   Hemoglobin 12.4 (L) 13.0 - 17.0 g/dL   HCT 38.5 (L) 39.0 - 52.0 %   MCV 85.6 78.0 - 100.0 fL   MCH 27.6 26.0 - 34.0 pg   MCHC 32.2 30.0 - 36.0 g/dL   RDW 15.5 11.5 -  15.5 %   Platelets 109 (L) 150 - 400 K/uL    Comment: PLATELET COUNT CONFIRMED BY SMEAR SPECIMEN CHECKED FOR CLOTS   Heparin level (unfractionated)     Status: Abnormal   Collection Time: 07/03/17  8:19 PM  Result Value Ref Range   Heparin Unfractionated 0.19 (L) 0.30 - 0.70 IU/mL    Comment:        IF HEPARIN RESULTS ARE BELOW EXPECTED VALUES, AND PATIENT DOSAGE HAS BEEN CONFIRMED, SUGGEST FOLLOW UP TESTING OF ANTITHROMBIN III LEVELS.   APTT     Status: None   Collection Time: 07/03/17  8:19 PM  Result Value Ref Range   aPTT 35 24 - 36 seconds  Basic metabolic panel     Status: Abnormal   Collection Time: 07/04/17  4:40 AM  Result Value Ref Range   Sodium 136 135 - 145 mmol/L   Potassium 4.4 3.5 - 5.1 mmol/L   Chloride 103 101 - 111 mmol/L   CO2 24 22 - 32 mmol/L   Glucose, Bld 129 (H) 65 - 99 mg/dL  BUN 17 6 - 20 mg/dL   Creatinine, Ser 1.69 (H) 0.61 - 1.24 mg/dL   Calcium 8.9 8.9 - 10.3 mg/dL   GFR calc non Af Amer 46 (L) >60 mL/min   GFR calc Af Amer 54 (L) >60 mL/min    Comment: (NOTE) The eGFR has been calculated using the CKD EPI equation. This calculation has not been validated in all clinical situations. eGFR's persistently <60 mL/min signify possible Chronic Kidney Disease.    Anion gap 9 5 - 15  CBC     Status: Abnormal   Collection Time: 07/04/17  4:40 AM  Result Value Ref Range   WBC 4.1 4.0 - 10.5 K/uL   RBC 4.52 4.22 - 5.81 MIL/uL   Hemoglobin 12.4 (L) 13.0 - 17.0 g/dL   HCT 38.7 (L) 39.0 - 52.0 %   MCV 85.6 78.0 - 100.0 fL   MCH 27.4 26.0 - 34.0 pg   MCHC 32.0 30.0 - 36.0 g/dL   RDW 15.6 (H) 11.5 - 15.5 %   Platelets 116 (L) 150 - 400 K/uL    Comment: CONSISTENT WITH PREVIOUS RESULT SPECIMEN CHECKED FOR CLOTS   Magnesium     Status: None   Collection Time: 07/04/17  4:40 AM  Result Value Ref Range   Magnesium 1.8 1.7 - 2.4 mg/dL  Heparin level (unfractionated)     Status: Abnormal   Collection Time: 07/04/17  4:40 AM  Result Value Ref  Range   Heparin Unfractionated 0.90 (H) 0.30 - 0.70 IU/mL    Comment:        IF HEPARIN RESULTS ARE BELOW EXPECTED VALUES, AND PATIENT DOSAGE HAS BEEN CONFIRMED, SUGGEST FOLLOW UP TESTING OF ANTITHROMBIN III LEVELS.   APTT     Status: Abnormal   Collection Time: 07/04/17  4:40 AM  Result Value Ref Range   aPTT 136 (H) 24 - 36 seconds    Comment:        IF BASELINE aPTT IS ELEVATED, SUGGEST PATIENT RISK ASSESSMENT BE USED TO DETERMINE APPROPRIATE ANTICOAGULANT THERAPY.   Protime-INR     Status: None   Collection Time: 07/04/17  4:40 AM  Result Value Ref Range   Prothrombin Time 14.6 11.4 - 15.2 seconds   INR 1.15     Dg Abd Acute W/chest  Result Date: 07/04/2017 CLINICAL DATA:  Small-bowel obstruction . EXAM: DG ABDOMEN ACUTE W/ 1V CHEST COMPARISON:  07/03/2017 . FINDINGS: Mediastinum and hilar structures normal. Heart size normal. Bibasilar atelectasis/ infiltrates. No pleural effusion or pneumothorax. Distended loops of bowel are again noted. Again these findings are most consistent adynamic ileus. Again bowel obstruction cannot be excluded. Stool noted in the colon. Ostomy noted over the right lower quadrant . No free air. IVC filter noted in stable position. IMPRESSION: 1. Persistent distended loops of bowel without significant change. Findings consistent with adynamic ileus. Bowel obstruction Again cannot be excluded. Stool in the colon. Ostomy noted over the right lower quadrant. No free air. 2.  Bibasilar atelectasis.  Stable cardiomegaly . Electronically Signed   By: Marcello Moores  Register   On: 07/04/2017 08:05   Dg Abd Acute W/chest  Result Date: 07/03/2017 CLINICAL DATA:  Generalized abdominal pain and nausea and diarrhea since last night. History of partial pancreatectomy, small-bowel obstruction, colonic resection and colostomy creation. EXAM: DG ABDOMEN ACUTE W/ 1V CHEST COMPARISON:  Abdominal radiograph of Feb 09, 2017 FINDINGS: The lungs are adequately inflated. There is left  basilar atelectasis superimposed upon scarring. The heart and pulmonary vascularity  are normal. There is no significant pleural effusion. Within the abdomen there is a small amount of air within the stomach. There is moderate distention of small and large bowel loops. There is an ostomy appliance over the right lower abdomen. No definite free extraluminal gas collections are observed. There is an inferior vena caval filter present. The bony structures exhibit no acute abnormalities. IMPRESSION: Findings compatible with an ileus or distal colonic obstruction. No evidence of perforation. Abdominal and pelvic CT scanning would be a useful next imaging step. Left basilar atelectasis with underlying scarring. Electronically Signed   By: David  Martinique M.D.   On: 07/03/2017 08:53   Dg Abd Portable 1v  Result Date: 07/04/2017 CLINICAL DATA:  Nasogastric tube placement. EXAM: PORTABLE ABDOMEN - 1 VIEW COMPARISON:  Radiographs of same day. FINDINGS: The bowel gas pattern is normal. Distal portion of nasogastric tube is seen coiled in the distal esophagus. It is not seen to enter the stomach. Repositioning is recommended. IVC filter is noted. IMPRESSION: Distal portion of nasogastric tube is seen coiled in distal esophagus. Repositioning is recommended. Electronically Signed   By: Marijo Conception, M.D.   On: 07/04/2017 08:08    ROS:  Pertinent items are noted in HPI.  Blood pressure 139/89, pulse 95, temperature 98.4 F (36.9 C), temperature source Oral, resp. rate 15, height '5\' 10"'  (1.778 m), weight 270 lb (122.5 kg), SpO2 96 %. Physical Exam: Pleasant well-developed well-nourished black male in no acute distress. Head is normocephalic, atraumatic Lungs are clear auscultation with breath sounds bilaterally. Heart examination reveals a regular rate and rhythm without S3, S4, murmurs. Abdomen is distended and somewhat tense. He does not have rigidity. 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 Minimal bowel sounds appreciated.   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 Unfortunately, do not have CT scan availability at this time.  Plan: Patient requests transfer to [facility] for further management and treatment. I agree with this as his previous surgeries were done there.  Aviva Signs 07/04/2017, 9:42 AM

## 2017-07-04 NOTE — Discharge Summary (Addendum)
Physician Discharge Summary  Peter [name]:[id number] DOB: [birth year] DOA: 07/03/2017  PCP: System, Provider Not In  Admit date: 07/03/2017 Discharge date: 07/04/2017  Admitted From: Home Disposition:  Transfer to [facility]      Discharge Condition: Stable CODE STATUS: FULL Diet recommendation: NPO   Brief/Interim Summary: 46 y.o. 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  Last BM on 07/01/17. The patient was recently admitted to the hospital from 02/08/2017 through 02/10/2017 for small bowel obstruction which improved with nonsurgical management. The patient also had lysis of adhesions in November 2017 and required a prolonged hospitalization for return of bowel function at that time.He has required several admissions for pSBO and then underwent extensive LOA and reduction of peristomal hernia on 08/16/2016. Patient denies fevers, chills, headache, chest pain, dyspnea, dysuria, hematuria, hematochezia, and melena.  The patient states that he has not had any recent changes in his diet or changes in his medications, but he continues on his chronic opioids. In the emergency department, the patient was afebrile and hemodynamically stable saturating 95% on room air. Patient had WBC 3.6, hemoglobin 12.3, platelets 120,000. BMP shows serum creatinine 2.07 which is slightly above his baseline. Acute abdominal series consistent with ileus versus distal colonic obstruction.  Unfortunately, CT scanner was non-operational on 07/03/17.  Dr. Richrd Prime discussed with general surgery, Dr. Lovell Sheehan, who felt patient was stable to remain at [facility] if CT scanner is fixed and scan can be obtained within a short/reasonable time frame.  Unfortunately, the patient continued to have N/V overnight  9/26-9/27.  There was difficulty placing an NG.  Three attempts were made.  Ultimately, an NG was placed in am 9/27, but xray showed it was coiled in the distal esophagus.  CT scanner remained under repair and remained unavailable.  NG was adjusted by Dr. Lovell Sheehan, then placed to LIS-->600cc out. After repeat evaluation of patient, it was felt in his best interest to transfer to [facility].  I discussed cased with Dr. Sherlynn Stalls whom accepted the patient in transfer.  Discharge Diagnoses:  Ileus versus colonic obstruction - secondary to adhesions -Unfortunately, CT is not available presently due to technical difficulties -CT abd/pelvis remains unavailable -Case was discussed with general surgery, Dr. Lovell Sheehan -having difficulty placing NG--AXR shows it remains coiled in distal esophagus -IVF -pain control -antiemetics -case discussed with Dr. Sherlynn Stalls at [facility] who agreed to accept the patient in transfer  Recurrent lower extremity DVT -holding rivaroxaban due to bowel obstruction -start IV heparin until patient able to tolerate po -previous thrombophilia work up negative -IVC filter retrieval planned 08/04/17  Dehydration -Secondary to vomiting -Continue IV fluids  CKD stage III -Baseline creatinine 1.6-1.8 -Serum creatinine peaked at 2.07  Chronic pancreatitis -Resume Creon when able to tolerate po  Chronic pain syndrome -Patient is normally on Percocet 5/325 one to 2 tablets every 6 hours when necessary -IV morphine until able to tolerate po  Chronic daily headache -resume topamax when able to tolerate po  Pancytopenia -Patient follows hematology at [facility] -WBC, hemoglobin, platelets are stable   Discharge Instructions  Discharge Instructions    Increase activity slowly    Complete by:  As directed      Allergies as of 07/04/2017      Reactions   Metrizamide Anaphylaxis   Patient is allergic to IV contrast per  Dr Rolland Porter.   Ace Inhibitors Swelling    Lip swelling   Coconut Flavor Swelling   Contrast Media [iodinated Diagnostic Agents]    Patient is allergic to IV contrast per Dr Rolland Porter.   Iodine    Patient experienced shortness of breath and chest tightness. Albuterol and benadryl were required.      Medication List    TAKE these medications   amitriptyline 150 MG tablet Commonly known as:  ELAVIL Take 150 mg by mouth at bedtime.   CREON 36000 UNITS Cpep capsule Generic drug:  lipase/protease/amylase Take 36,000-108,000 Units by mouth 3 (three) times daily with meals. 1 with snacks and 3 with meals   docusate sodium 100 MG capsule Commonly known as:  COLACE Take 100 mg by mouth daily. For constipation   ferrous sulfate 325 (65 FE) MG tablet Take 325 mg by mouth daily.   folic acid 1 MG tablet Commonly known as:  FOLVITE Take 1 mg by mouth daily.   heparin 100-0.45 UNIT/ML-% infusion Inject 1,750 Units/hr into the vein continuous.   magnesium oxide 400 (241.3 Mg) MG tablet Commonly known as:  MAG-OX Take 1 tablet by mouth 2 (two) times daily.   morphine 4 MG/ML injection Inject 1 mL (4 mg total) into the vein every 2 (two) hours as needed for severe pain.   multivitamin with minerals Tabs tablet Take 1 tablet by mouth daily.   oxyCODONE 5 MG immediate release tablet Commonly known as:  Oxy IR/ROXICODONE Take 5 mg by mouth 2 (two) times daily as needed for moderate pain or severe pain.   oxyCODONE-acetaminophen 5-325 MG tablet Commonly known as:  PERCOCET/ROXICET 1 or 2 tabs PO q6h prn pain What changed:  how much to take  how to take this  when to take this  reasons to take this  additional instructions   polyethylene glycol packet Commonly known as:  MIRALAX / GLYCOLAX Take 17 g by mouth daily as needed for mild constipation.   rivaroxaban 10 MG Tabs tablet Commonly known as:  XARELTO Take 10 mg by mouth every morning.   sildenafil 20 MG tablet Commonly known as:  REVATIO Take 20 mg by  mouth as needed (1 hour prior to sexual activity).   topiramate 50 MG tablet Commonly known as:  TOPAMAX Take 50-100 mg by mouth 2 (two) times daily. 1 tablet in the Am and 2 tablets at night   vitamin B-12 1000 MCG tablet Commonly known as:  CYANOCOBALAMIN Take 1,000 mcg by mouth every Monday, Wednesday, and Friday.   VITAMIN D PO Take 1 tablet by mouth daily.            Discharge Care Instructions        Start     Ordered   07/04/17 0000  heparin 100-0.45 UNIT/ML-% infusion  Continuous     07/04/17 0836   07/04/17 0000  morphine 4 MG/ML injection  Every 2 hours PRN     07/04/17 0836   07/04/17 0000  Increase activity slowly     07/04/17 0836      Allergies  Allergen Reactions  . Metrizamide Anaphylaxis    Patient is allergic to IV contrast per Dr Rolland Porter.  . Ace Inhibitors Swelling    Lip swelling  . Coconut Flavor Swelling  . Contrast Media [Iodinated Diagnostic Agents]     Patient is allergic to IV contrast per Dr Rolland Porter.  . Iodine     Patient experienced shortness of breath  and chest tightness. Albuterol and benadryl were required.    Consultations:  General surgery   Procedures/Studies: Dg Abd Acute W/chest  Result Date: 07/04/2017 CLINICAL DATA:  Small-bowel obstruction . EXAM: DG ABDOMEN ACUTE W/ 1V CHEST COMPARISON:  07/03/2017 . FINDINGS: Mediastinum and hilar structures normal. Heart size normal. Bibasilar atelectasis/ infiltrates. No pleural effusion or pneumothorax. Distended loops of bowel are again noted. Again these findings are most consistent adynamic ileus. Again bowel obstruction cannot be excluded. Stool noted in the colon. Ostomy noted over the right lower quadrant . No free air. IVC filter noted in stable position. IMPRESSION: 1. Persistent distended loops of bowel without significant change. Findings consistent with adynamic ileus. Bowel obstruction Again cannot be excluded. Stool in the colon. Ostomy noted over the right lower  quadrant. No free air. 2.  Bibasilar atelectasis.  Stable cardiomegaly . Electronically Signed   By: Maisie Fus  Register   On: 07/04/2017 08:05   Dg Abd Acute W/chest  Result Date: 07/03/2017 CLINICAL DATA:  Generalized abdominal pain and nausea and diarrhea since last night. History of partial pancreatectomy, small-bowel obstruction, colonic resection and colostomy creation. EXAM: DG ABDOMEN ACUTE W/ 1V CHEST COMPARISON:  Abdominal radiograph of Feb 09, 2017 FINDINGS: The lungs are adequately inflated. There is left basilar atelectasis superimposed upon scarring. The heart and pulmonary vascularity are normal. There is no significant pleural effusion. Within the abdomen there is a small amount of air within the stomach. There is moderate distention of small and large bowel loops. There is an ostomy appliance over the right lower abdomen. No definite free extraluminal gas collections are observed. There is an inferior vena caval filter present. The bony structures exhibit no acute abnormalities. IMPRESSION: Findings compatible with an ileus or distal colonic obstruction. No evidence of perforation. Abdominal and pelvic CT scanning would be a useful next imaging step. Left basilar atelectasis with underlying scarring. Electronically Signed   By: Roderick Calo  [place] M.D.   On: 07/03/2017 08:53   Dg Abd Portable 1v  Result Date: 07/04/2017 CLINICAL DATA:  Nasogastric tube placement. EXAM: PORTABLE ABDOMEN - 1 VIEW COMPARISON:  Radiographs of same day. FINDINGS: The bowel gas pattern is normal. Distal portion of nasogastric tube is seen coiled in the distal esophagus. It is not seen to enter the stomach. Repositioning is recommended. IVC filter is noted. IMPRESSION: Distal portion of nasogastric tube is seen coiled in distal esophagus. Repositioning is recommended. Electronically Signed   By: Lupita Raider, M.D.   On: 07/04/2017 08:08        Discharge Exam: Vitals:   07/03/17 2229 07/04/17 0450  BP: (!)  143/103 139/89  Pulse: 98 95  Resp: 16 15  Temp: 98.8 F (37.1 C) 98.4 F (36.9 C)  SpO2: 95% 96%   Vitals:   07/03/17 1345 07/03/17 1950 07/03/17 2229 07/04/17 0450  BP: (!) 148/87  (!) 143/103 139/89  Pulse: 97  98 95  Resp:   16 15  Temp: 98.5 F (36.9 C)  98.8 F (37.1 C) 98.4 F (36.9 C)  TempSrc: Oral  Oral Oral  SpO2: 96% 93% 95% 96%  Weight: 122.5 kg (270 lb)     Height:  (1.778 m)       General: Pt is alert, awake, not in acute distress Cardiovascular: RRR, S1/S2 +, no rubs, no gallops Respiratory: CTA bilaterally, no wheezing, no rhonchi Abdominal: Distended, diminished bowel sounds Extremities: no edema, no cyanosis   The results of significant diagnostics from this hospitalization (including  imaging, microbiology, ancillary and laboratory) are listed below for reference.    Significant Diagnostic Studies: Dg Abd Acute W/chest  Result Date: 07/04/2017 CLINICAL DATA:  Small-bowel obstruction . EXAM: DG ABDOMEN ACUTE W/ 1V CHEST COMPARISON:  07/03/2017 . FINDINGS: Mediastinum and hilar structures normal. Heart size normal. Bibasilar atelectasis/ infiltrates. No pleural effusion or pneumothorax. Distended loops of bowel are again noted. Again these findings are most consistent adynamic ileus. Again bowel obstruction cannot be excluded. Stool noted in the colon. Ostomy noted over the right lower quadrant . No free air. IVC filter noted in stable position. IMPRESSION: 1. Persistent distended loops of bowel without significant change. Findings consistent with adynamic ileus. Bowel obstruction Again cannot be excluded. Stool in the colon. Ostomy noted over the right lower quadrant. No free air. 2.  Bibasilar atelectasis.  Stable cardiomegaly . Electronically Signed   By: Maisie Fus  Register   On: 07/04/2017 08:05   Dg Abd Acute W/chest  Result Date: 07/03/2017 CLINICAL DATA:  Generalized abdominal pain and nausea and diarrhea since last night. History of partial  pancreatectomy, small-bowel obstruction, colonic resection and colostomy creation. EXAM: DG ABDOMEN ACUTE W/ 1V CHEST COMPARISON:  Abdominal radiograph of Feb 09, 2017 FINDINGS: The lungs are adequately inflated. There is left basilar atelectasis superimposed upon scarring. The heart and pulmonary vascularity are normal. There is no significant pleural effusion. Within the abdomen there is a small amount of air within the stomach. There is moderate distention of small and large bowel loops. There is an ostomy appliance over the right lower abdomen. No definite free extraluminal gas collections are observed. There is an inferior vena caval filter present. The bony structures exhibit no acute abnormalities. IMPRESSION: Findings compatible with an ileus or distal colonic obstruction. No evidence of perforation. Abdominal and pelvic CT scanning would be a useful next imaging step. Left basilar atelectasis with underlying scarring. Electronically Signed   By: Namya Voges  [place] M.D.   On: 07/03/2017 08:53   Dg Abd Portable 1v  Result Date: 07/04/2017 CLINICAL DATA:  Nasogastric tube placement. EXAM: PORTABLE ABDOMEN - 1 VIEW COMPARISON:  Radiographs of same day. FINDINGS: The bowel gas pattern is normal. Distal portion of nasogastric tube is seen coiled in the distal esophagus. It is not seen to enter the stomach. Repositioning is recommended. IVC filter is noted. IMPRESSION: Distal portion of nasogastric tube is seen coiled in distal esophagus. Repositioning is recommended. Electronically Signed   By: Lupita Raider, M.D.   On: 07/04/2017 08:08     Microbiology: No results found for this or any previous visit (from the past 240 hour(s)).   Labs: Basic Metabolic Panel:  Recent Labs Lab 07/03/17 0811 07/04/17 0440  NA 135 136  K 4.1 4.4  CL 100* 103  CO2 25 24  GLUCOSE 127* 129*  BUN 17 17  CREATININE 2.07* 1.69*  CALCIUM 9.6 8.9  MG  --  1.8   Liver Function Tests:  Recent Labs Lab  07/03/17 0811  AST 31  ALT 45  ALKPHOS 84  BILITOT 0.5  PROT 8.3*  ALBUMIN 4.5    Recent Labs Lab 07/03/17 0811  LIPASE 23   No results for input(s): AMMONIA in the last 168 hours. CBC:  Recent Labs Lab 07/03/17 0811 07/03/17 1358 07/04/17 0440  WBC 3.6* 4.2 4.1  NEUTROABS 2.7  --   --   HGB 12.3* 12.4* 12.4*  HCT 39.0 38.5* 38.7*  MCV 85.2 85.6 85.6  PLT 120* 109* 116*   Cardiac  Enzymes: No results for input(s): CKTOTAL, CKMB, CKMBINDEX, TROPONINI in the last 168 hours. BNP: Invalid input(s): POCBNP CBG: No results for input(s): GLUCAP in the last 168 hours.  Time coordinating discharge:  Greater than 30 minutes  Signed:  Jahad Old, DO [facility] Pager: [phone] 07/04/2017, 8:36 AM

## 2017-07-04 NOTE — Progress Notes (Signed)
Report called to Vernona Rieger at [facility]. Patient has right [facility] IV and #16 NG tube, BP 117/84, T 98.2, P 88, R 20, O2 sat 97% on room air.

## 2017-07-04 NOTE — Progress Notes (Signed)
[facility] on unit to transfer patient to [facility], patient stable and report given.

## 2017-09-02 ENCOUNTER — Other Ambulatory Visit: Payer: Self-pay

## 2017-09-02 ENCOUNTER — Emergency Department (HOSPITAL_COMMUNITY): Payer: Medicare Other

## 2017-09-02 ENCOUNTER — Emergency Department (HOSPITAL_COMMUNITY)
Admission: EM | Admit: 2017-09-02 | Discharge: 2017-09-02 | Disposition: A | Discharge status: Home / Self Care | Payer: Medicare Other | Attending: Emergency Medicine | Admitting: Emergency Medicine

## 2017-09-02 ENCOUNTER — Encounter (HOSPITAL_COMMUNITY): Payer: Self-pay | Admitting: Emergency Medicine

## 2017-09-02 DIAGNOSIS — Z79899 Other long term (current) drug therapy: Secondary | ICD-10-CM | POA: Diagnosis not present

## 2017-09-02 DIAGNOSIS — Z7901 Long term (current) use of anticoagulants: Secondary | ICD-10-CM | POA: Insufficient documentation

## 2017-09-02 DIAGNOSIS — R509 Fever, unspecified: Secondary | ICD-10-CM | POA: Diagnosis not present

## 2017-09-02 DIAGNOSIS — G8929 Other chronic pain: Secondary | ICD-10-CM | POA: Insufficient documentation

## 2017-09-02 DIAGNOSIS — N183 Chronic kidney disease, stage 3 (moderate): Secondary | ICD-10-CM | POA: Insufficient documentation

## 2017-09-02 DIAGNOSIS — K9189 Other postprocedural complications and disorders of digestive system: Secondary | ICD-10-CM

## 2017-09-02 DIAGNOSIS — G8918 Other acute postprocedural pain: Secondary | ICD-10-CM | POA: Diagnosis present

## 2017-09-02 DIAGNOSIS — I129 Hypertensive chronic kidney disease with stage 1 through stage 4 chronic kidney disease, or unspecified chronic kidney disease: Secondary | ICD-10-CM | POA: Diagnosis not present

## 2017-09-02 DIAGNOSIS — K567 Ileus, unspecified: Secondary | ICD-10-CM

## 2017-09-02 DIAGNOSIS — K913 Postprocedural intestinal obstruction, unspecified as to partial versus complete: Secondary | ICD-10-CM | POA: Diagnosis not present

## 2017-09-02 DIAGNOSIS — D631 Anemia in chronic kidney disease: Secondary | ICD-10-CM | POA: Diagnosis not present

## 2017-09-02 LAB — URINALYSIS, ROUTINE W REFLEX MICROSCOPIC
BILIRUBIN URINE: NEGATIVE
Glucose, UA: NEGATIVE mg/dL
Hgb urine dipstick: NEGATIVE
Ketones, ur: 5 mg/dL — AB
Leukocytes, UA: NEGATIVE
NITRITE: NEGATIVE
Protein, ur: NEGATIVE mg/dL
SPECIFIC GRAVITY, URINE: 1.018 (ref 1.005–1.030)
pH: 6 (ref 5.0–8.0)

## 2017-09-02 LAB — CBC WITH DIFFERENTIAL/PLATELET
BASOS PCT: 0 %
Basophils Absolute: 0 10*3/uL (ref 0.0–0.1)
Eosinophils Absolute: 0.2 10*3/uL (ref 0.0–0.7)
Eosinophils Relative: 3 %
HEMATOCRIT: 24.4 % — AB (ref 39.0–52.0)
HEMOGLOBIN: 7.6 g/dL — AB (ref 13.0–17.0)
LYMPHS ABS: 0.7 10*3/uL (ref 0.7–4.0)
LYMPHS PCT: 11 %
MCH: 27.3 pg (ref 26.0–34.0)
MCHC: 31.1 g/dL (ref 30.0–36.0)
MCV: 87.8 fL (ref 78.0–100.0)
MONOS PCT: 4 %
Monocytes Absolute: 0.2 10*3/uL (ref 0.1–1.0)
NEUTROS ABS: 4.7 10*3/uL (ref 1.7–7.7)
NEUTROS PCT: 82 %
Platelets: 263 10*3/uL (ref 150–400)
RBC: 2.78 MIL/uL — ABNORMAL LOW (ref 4.22–5.81)
RDW: 15.8 % — ABNORMAL HIGH (ref 11.5–15.5)
WBC: 5.8 10*3/uL (ref 4.0–10.5)

## 2017-09-02 LAB — COMPREHENSIVE METABOLIC PANEL
ALBUMIN: 3.3 g/dL — AB (ref 3.5–5.0)
ALK PHOS: 95 U/L (ref 38–126)
ALT: 56 U/L (ref 17–63)
ANION GAP: 8 (ref 5–15)
AST: 45 U/L — ABNORMAL HIGH (ref 15–41)
BILIRUBIN TOTAL: 0.5 mg/dL (ref 0.3–1.2)
BUN: 8 mg/dL (ref 6–20)
CALCIUM: 8.5 mg/dL — AB (ref 8.9–10.3)
CO2: 28 mmol/L (ref 22–32)
Chloride: 96 mmol/L — ABNORMAL LOW (ref 101–111)
Creatinine, Ser: 1.31 mg/dL — ABNORMAL HIGH (ref 0.61–1.24)
GFR calc Af Amer: >60 mL/min (ref >60)
GFR calc non Af Amer: >60 mL/min (ref >60)
GLUCOSE: 126 mg/dL — AB (ref 65–99)
Potassium: 3.7 mmol/L (ref 3.5–5.1)
Sodium: 132 mmol/L — ABNORMAL LOW (ref 135–145)
TOTAL PROTEIN: 7.1 g/dL (ref 6.5–8.1)

## 2017-09-02 LAB — LACTIC ACID, PLASMA
LACTIC ACID, VENOUS: 1 mmol/L (ref 0.5–1.9)
Lactic Acid, Venous: 0.7 mmol/L (ref 0.5–1.9)

## 2017-09-02 MED ORDER — FENTANYL CITRATE (PF) 100 MCG/2ML IJ SOLN
100.0000 ug | Freq: Once | INTRAMUSCULAR | Status: AC
  Administered 2017-09-02: 100 ug via INTRAVENOUS
  Filled 2017-09-02: qty 2

## 2017-09-02 MED ORDER — ACETAMINOPHEN 325 MG PO TABS
650.0000 mg | ORAL_TABLET | Freq: Once | ORAL | Status: AC
  Administered 2017-09-02: 650 mg via ORAL

## 2017-09-02 MED ORDER — ACETAMINOPHEN 325 MG PO TABS
ORAL_TABLET | ORAL | Status: AC
  Filled 2017-09-02: qty 2

## 2017-09-02 MED ORDER — ONDANSETRON HCL 4 MG/2ML IJ SOLN
4.0000 mg | Freq: Once | INTRAMUSCULAR | Status: AC
  Administered 2017-09-02: 4 mg via INTRAVENOUS
  Filled 2017-09-02: qty 2

## 2017-09-02 MED ORDER — SODIUM CHLORIDE 0.9 % IV BOLUS (SEPSIS)
1000.0000 mL | Freq: Once | INTRAVENOUS | Status: AC
  Administered 2017-09-02: 1000 mL via INTRAVENOUS

## 2017-09-02 NOTE — ED Notes (Signed)
02 2l Esmond placed due to ra 90%. edp aware.

## 2017-09-02 NOTE — ED Provider Notes (Signed)
[facility]ERGENCY DEPARTMENT Provider Note   CSN: [id number] Arrival date & time: 09/02/17  1650     History   Chief Complaint Chief Complaint  Patient presents with  . Post-op Problem    HPI Peter [name] is a 46 y.o. male.  HPI Patient presents with abdominal pain.  10 days ago at [facility] he had abdominal surgery with some removal of large intestine and lysis of adhesions.  During the time in the hospital he had had some fever.  Also had to have blood transfusion and left with a hemoglobin in the sevens.  No clear cause of fever found at that time.  Had had CT scan since the surgery.  Went home yesterday and now with increased abdominal pain.  Had issue with abdominal distention and ileus while in the hospital.  Pain got worse today and came back.  He is on Xarelto for previous DVT.  Patient has been passing gas.  Did not have much of a bowel movement today.  He did have a bowel movement before he left the hospital. Past Medical History:  Diagnosis Date  . Bowel obstruction (HCC)   . Chronic abdominal pain   . Chronic back pain   . CKD (chronic kidney disease)   . DVT (deep venous thrombosis) (HCC)    lower extremity  . Hypertension   . Ileus (HCC) 01/03/2012  . Kidney stones   . Migraine headache   . Pain management   . Pancreatitis   . Pancytopenia (HCC)   . Retroperitoneal bleed 01/04/2012    Patient Active Problem List   Diagnosis Date Noted  . Intractable vomiting with nausea   . Bowel obstruction (HCC) 07/03/2017  . CKD (chronic kidney disease) stage 3, GFR 30-59 ml/min (HCC) 07/03/2017  . Dehydration 07/03/2017  . AKI (acute kidney injury) (HCC) 08/15/2016  . CKD (chronic kidney disease), stage III (HCC) 08/15/2016  . History of DVT (deep vein thrombosis) 08/15/2016  . Thrombocytopenia (HCC) 08/15/2016  . Generalized abdominal pain   . Small bowel obstruction (HCC)   . Pneumatosis intestinalis   . Hyperglycemia 01/05/2012  . Peripheral edema  01/04/2012  . Anemia 01/03/2012  . Metabolic acidosis 12/30/2011  . Hypocalcemia 12/30/2011  . Hypertriglyceridemia 12/30/2011  . Chronic pancreatitis (HCC) 12/29/2011  . Hepatic steatosis 12/29/2011  . HTN (hypertension) 10/15/2011  . Alcohol abuse 10/15/2011  . Obesity 10/15/2011    Past Surgical History:  Procedure Laterality Date  . chronic pancreatitiis    . COLON RESECTION    . colon surgery    . COLOSTOMY REVERSAL    . HERNIA REPAIR    . IVC FILTER PLACEMENT ([facility] HX)    . LITHOTRIPSY    . Mucous Fistula     2013  . PANCREAS SURGERY     half of pancreas removed  . PANCREATITIC FISTULA    . SBO         Home Medications    Prior to Admission medications   Medication Sig Start Date End Date Taking? Authorizing Provider  amitriptyline (ELAVIL) 150 MG tablet Take 150 mg by mouth at bedtime.  11/30/14   [provider]  Cholecalciferol (VITAMIN D PO) Take 1 tablet by mouth daily.    [provider]  CREON 36000 UNITS CPEP capsule Take 36,000-108,000 Units by mouth 3 (three) times daily with meals. 1 with snacks and 3 with meals 12/11/14   [provider]  docusate sodium (COLACE) 100 MG capsule Take 100  mg by mouth daily. For constipation 12/19/14   [provider]  ferrous sulfate 325 (65 FE) MG tablet Take 325 mg by mouth daily.    [provider]  folic acid (FOLVITE) 1 MG tablet Take 1 mg by mouth daily. 04/13/15   [provider]  heparin 100-0.45 UNIT/ML-% infusion Inject 1,750 Units/hr into the vein continuous. 07/04/17   Catarina Hartshorn, MD  magnesium oxide (MAG-OX) 400 (241.3 MG) MG tablet Take 1 tablet by mouth 2 (two) times daily. 01/13/15   [provider]  morphine 4 MG/ML injection Inject 1 mL (4 mg total) into the vein every 2 (two) hours as needed for severe pain. 07/04/17   Catarina Hartshorn, MD  Multiple Vitamin (MULTIVITAMIN WITH MINERALS) TABS tablet Take 1 tablet by mouth daily.    [provider]    oxyCODONE (OXY IR/ROXICODONE) 5 MG immediate release tablet Take 5 mg by mouth 2 (two) times daily as needed for moderate pain or severe pain.  10/10/16   [provider]  oxyCODONE-acetaminophen (PERCOCET/ROXICET) 5-325 MG tablet 1 or 2 tabs PO q6h prn pain Patient taking differently: Take 1-2 tablets by mouth every 6 (six) hours as needed.  11/07/16   [name], DO  polyethylene glycol [facility] / GLYCOLAX) packet Take 17 g by mouth daily as needed for mild constipation. 02/10/17   Erick Blinks, MD  rivaroxaban (XARELTO) 10 MG TABS tablet Take 10 mg by mouth every morning. 10/30/16   [provider]  sildenafil (REVATIO) 20 MG tablet Take 20 mg by mouth as needed (1 hour prior to sexual activity).  08/10/16   [provider]  topiramate (TOPAMAX) 50 MG tablet Take 50-100 mg by mouth 2 (two) times daily. 1 tablet in the Am and 2 tablets at night    [provider]  vitamin B-12 (CYANOCOBALAMIN) 1000 MCG tablet Take 1,000 mcg by mouth every Monday, Wednesday, and Friday.     [provider]    Family History Family History  Problem Relation Age of Onset  . Colon cancer Neg Hx     Social History Social History   Tobacco Use  . Smoking status: Never Smoker  . Smokeless tobacco: Never Used  Substance Use Topics  . Alcohol use: No    Alcohol/week: 4.2 oz    Types: 7 Shots of liquor per week    Comment: former  . Drug use: No     Allergies   Metrizamide; Ace inhibitors; Coconut flavor; Contrast media [iodinated diagnostic agents]; and Iodine   Review of Systems Review of Systems  Constitutional: Positive for appetite change.  HENT: Negative for congestion.   Respiratory: Negative for shortness of breath.   Gastrointestinal: Positive for nausea.  Endocrine: Negative for polyuria.  Genitourinary: Negative for flank pain.  Musculoskeletal: Negative for back pain.  Neurological: Negative for numbness.  Hematological: Negative for  adenopathy.  Psychiatric/Behavioral: Negative for agitation and confusion.     Physical Exam Updated Vital Signs BP (!) 146/87   Pulse (!) 106   Temp 98.3 F (36.8 C) (Oral)   Resp 18   Ht 5\' 10"  (1.778 m)   Wt 108.9 kg (240 lb)   SpO2 100%   BMI 34.44 kg/m   Physical Exam  Constitutional: He appears well-developed.  Uncomfortable and tearful.  HENT:  Head: Normocephalic.  Eyes: Pupils are equal, round, and reactive to light.  Neck: Neck supple.  Cardiovascular:  Tachycardia  Pulmonary/Chest: Effort normal.  Abdominal: He exhibits distension. There  is tenderness.  Diffuse tenderness.  Dressing on mid abdomen and right side are clean dry and intact.  No surrounding erythema.  No hernias palpated.  Musculoskeletal: He exhibits edema.  Neurological: He is alert.  Skin: Skin is warm. Capillary refill takes less than 2 seconds.     ED Treatments / Results  Labs (all labs ordered are listed, but only abnormal results are displayed) Labs Reviewed  CBC WITH DIFFERENTIAL/PLATELET - Abnormal; Notable for the following components:      Result Value   RBC 2.78 (*)    Hemoglobin 7.6 (*)    HCT 24.4 (*)    RDW 15.8 (*)    All other components within normal limits  COMPREHENSIVE METABOLIC PANEL - Abnormal; Notable for the following components:   Sodium 132 (*)    Chloride 96 (*)    Glucose, Bld 126 (*)    Creatinine, Ser 1.31 (*)    Calcium 8.5 (*)    Albumin 3.3 (*)    AST 45 (*)    All other components within normal limits  URINALYSIS, ROUTINE W REFLEX MICROSCOPIC - Abnormal; Notable for the following components:   Ketones, ur 5 (*)    All other components within normal limits  CULTURE, BLOOD (ROUTINE X 2)  CULTURE, BLOOD (ROUTINE X 2)  LACTIC ACID, PLASMA  LACTIC ACID, PLASMA    EKG  EKG Interpretation None       Radiology Dg Abdomen Acute W/chest  Result Date: 09/02/2017 CLINICAL DATA:  46 y/o M; patient reports partial large bowel resection discharge  yesterday. Presenting with nausea, fever, increased pain, and no bowel movement. EXAM: DG ABDOMEN ACUTE W/ 1V CHEST COMPARISON:  07/04/2017 abdomen radiograph FINDINGS: Low lung volumes accentuate pulmonary markings. Streaky bibasilar opacities likely represent atelectasis. Stable normal cardiac silhouette given projection and technique. Midline surgical skin staples are present projecting over the abdomen. Dilated loops of large and small bowel are present throughout the abdomen probably represent postoperative ileus. No acute osseous abnormality is evident. No pneumoperitoneum identified. IMPRESSION: Low lung volumes. Diffusely dilated loops of large and small bowel, probably ileus. Electronically Signed   By: Mitzi Hansen M.D.   On: 09/02/2017 18:44    Procedures Procedures (including critical care time)  Medications Ordered in ED Medications  acetaminophen (TYLENOL) 325 MG tablet (not administered)  sodium chloride 0.9 % bolus 1,000 mL (0 mLs Intravenous Stopped 09/02/17 2005)  ondansetron (ZOFRAN) injection 4 mg (4 mg Intravenous Given 09/02/17 1753)  fentaNYL (SUBLIMAZE) injection 100 mcg (100 mcg Intravenous Given 09/02/17 1753)  acetaminophen (TYLENOL) tablet 650 mg (650 mg Oral Given 09/02/17 1800)     Initial Impression / Assessment and Plan / ED Course  I have reviewed the triage vital signs and the nursing notes.  Pertinent labs & imaging results that were available during my care of the patient were reviewed by me and considered in my medical decision making (see chart for details).     Patient with abdominal distention.  Recent abdominal surgery.  Has had low-grade fever but has normal white count.  X-ray showed possible ileus.  Feels better after treatment.  Continue to watch patient and he tolerated some liquids here.  Heart rate is come down and patient does feel better.  This appears to be continuation what he has been dealing with over the last few days both at  home and in the hospital at [facility].  Still has some abdominal distention but the tenderness much improved.  Will discharge  home to follow-up with a surgeon as needed and return if symptoms return.  Final Clinical Impressions(s) / ED Diagnoses   Final diagnoses:  Ileus following gastrointestinal surgery    ED Discharge Orders    None       Benjiman CorePickering, Tyreke Kaeser, MD 09/02/17 2149

## 2017-09-02 NOTE — ED Triage Notes (Signed)
Pt wife reports pt had abdominal surgery at [facility] on 08/23/17 to remove part of his large intestine. PT was discharged yesterday and last night started having more nausea, fever and no bowel movement with increased pain.

## 2017-09-02 NOTE — Discharge Instructions (Signed)
Follow-up with your surgeon.  Return for worsening pain or increasing fevers.

## 2017-09-02 NOTE — ED Notes (Signed)
Pt taken to xray 

## 2017-09-02 NOTE — ED Notes (Signed)
Pt difficult stick. x2 by one RN. Second RN attempting now. Pt grimacing and crying at this time. Wound dressing intact with no drainage noted.

## 2017-09-07 LAB — CULTURE, BLOOD (ROUTINE X 2)
Culture: NO GROWTH
Culture: NO GROWTH
SPECIAL REQUESTS: ADEQUATE

## 2018-02-20 ENCOUNTER — Encounter (HOSPITAL_COMMUNITY): Payer: Self-pay

## 2018-02-20 ENCOUNTER — Other Ambulatory Visit: Payer: Self-pay

## 2018-02-20 ENCOUNTER — Emergency Department (HOSPITAL_COMMUNITY)
Admission: EM | Admit: 2018-02-20 | Discharge: 2018-02-20 | Disposition: A | Discharge status: Home / Self Care | Payer: Medicare Other | Attending: Emergency Medicine | Admitting: Emergency Medicine

## 2018-02-20 ENCOUNTER — Emergency Department (HOSPITAL_COMMUNITY): Payer: Medicare Other

## 2018-02-20 DIAGNOSIS — I129 Hypertensive chronic kidney disease with stage 1 through stage 4 chronic kidney disease, or unspecified chronic kidney disease: Secondary | ICD-10-CM | POA: Insufficient documentation

## 2018-02-20 DIAGNOSIS — R140 Abdominal distension (gaseous): Secondary | ICD-10-CM | POA: Insufficient documentation

## 2018-02-20 DIAGNOSIS — Z79899 Other long term (current) drug therapy: Secondary | ICD-10-CM | POA: Insufficient documentation

## 2018-02-20 DIAGNOSIS — N183 Chronic kidney disease, stage 3 (moderate): Secondary | ICD-10-CM | POA: Insufficient documentation

## 2018-02-20 DIAGNOSIS — K861 Other chronic pancreatitis: Secondary | ICD-10-CM | POA: Diagnosis not present

## 2018-02-20 DIAGNOSIS — E1122 Type 2 diabetes mellitus with diabetic chronic kidney disease: Secondary | ICD-10-CM | POA: Insufficient documentation

## 2018-02-20 DIAGNOSIS — Z7901 Long term (current) use of anticoagulants: Secondary | ICD-10-CM | POA: Insufficient documentation

## 2018-02-20 DIAGNOSIS — R1084 Generalized abdominal pain: Secondary | ICD-10-CM

## 2018-02-20 LAB — CBC WITH DIFFERENTIAL/PLATELET
Basophils Absolute: 0 10*3/uL (ref 0.0–0.1)
Basophils Relative: 0 %
EOS PCT: 2 %
Eosinophils Absolute: 0.1 10*3/uL (ref 0.0–0.7)
HEMATOCRIT: 37.3 % — AB (ref 39.0–52.0)
HEMOGLOBIN: 12 g/dL — AB (ref 13.0–17.0)
Lymphocytes Relative: 45 %
Lymphs Abs: 1.5 10*3/uL (ref 0.7–4.0)
MCH: 25.8 pg — AB (ref 26.0–34.0)
MCHC: 32.2 g/dL (ref 30.0–36.0)
MCV: 80.2 fL (ref 78.0–100.0)
MONO ABS: 0.4 10*3/uL (ref 0.1–1.0)
Monocytes Relative: 11 %
Neutro Abs: 1.4 10*3/uL — ABNORMAL LOW (ref 1.7–7.7)
Neutrophils Relative %: 42 %
Platelets: 130 10*3/uL — ABNORMAL LOW (ref 150–400)
RBC: 4.65 MIL/uL (ref 4.22–5.81)
RDW: 18.3 % — ABNORMAL HIGH (ref 11.5–15.5)
WBC: 3.3 10*3/uL — ABNORMAL LOW (ref 4.0–10.5)

## 2018-02-20 LAB — COMPREHENSIVE METABOLIC PANEL
ALT: 35 U/L (ref 17–63)
AST: 29 U/L (ref 15–41)
Albumin: 4.3 g/dL (ref 3.5–5.0)
Alkaline Phosphatase: 82 U/L (ref 38–126)
Anion gap: 9 (ref 5–15)
BUN: 18 mg/dL (ref 6–20)
CO2: 23 mmol/L (ref 22–32)
CREATININE: 2.07 mg/dL — AB (ref 0.61–1.24)
Calcium: 9.5 mg/dL (ref 8.9–10.3)
Chloride: 105 mmol/L (ref 101–111)
GFR, EST AFRICAN AMERICAN: 42 mL/min — AB (ref >60)
GFR, EST NON AFRICAN AMERICAN: 36 mL/min — AB (ref >60)
Glucose, Bld: 133 mg/dL — ABNORMAL HIGH (ref 65–99)
Potassium: 3.7 mmol/L (ref 3.5–5.1)
SODIUM: 137 mmol/L (ref 135–145)
TOTAL PROTEIN: 7.8 g/dL (ref 6.5–8.1)
Total Bilirubin: 0.5 mg/dL (ref 0.3–1.2)

## 2018-02-20 LAB — I-STAT CG4 LACTIC ACID, ED: Lactic Acid, Venous: 1.31 mmol/L (ref 0.5–1.9)

## 2018-02-20 LAB — LIPASE, BLOOD: LIPASE: 29 U/L (ref 11–51)

## 2018-02-20 MED ORDER — ONDANSETRON HCL 4 MG/2ML IJ SOLN
4.0000 mg | Freq: Once | INTRAMUSCULAR | Status: AC
  Administered 2018-02-20: 4 mg via INTRAVENOUS
  Filled 2018-02-20: qty 2

## 2018-02-20 MED ORDER — HYDROMORPHONE HCL 1 MG/ML IJ SOLN
1.0000 mg | Freq: Once | INTRAMUSCULAR | Status: AC
  Administered 2018-02-20: 1 mg via INTRAVENOUS
  Filled 2018-02-20: qty 1

## 2018-02-20 NOTE — ED Triage Notes (Signed)
Pt reports history of pancreatitis, states this onset a few days ago, worse today.  Pt admits to little diarrhea, nausea, no vomiting.

## 2018-02-20 NOTE — ED Provider Notes (Signed)
[facility] EMERGENCY DEPARTMENT Provider Note   CSN: [id number] Arrival date & time: 02/20/18  0139     History   Chief Complaint Chief Complaint  Patient presents with  . Abdominal Pain    HPI Peter [name] is a 47 y.o. male.  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  Patient has chronic abdominal pain, however this evening he started developing more generalized abdominal pain with higher intensity.  Pain is located primarily in the periumbilical region and is nonradiating.  Patient describes the pain as sharp and throbbing and he has associated nausea.  Bowel movements were normal today.  Patient thinks that his pain is because of his chronic pancreatitis.  He does not drink or smoke.  Past Medical History:  Diagnosis Date  . Bowel obstruction (HCC)   . Chronic abdominal pain   . Chronic back pain   . CKD (chronic kidney disease)   . DVT (deep venous thrombosis) (HCC)    lower extremity  . Hypertension   . Ileus (HCC) 01/03/2012  . Kidney stones   . Migraine headache   . Pain management   . Pancreatitis   . Pancytopenia (HCC)   . Retroperitoneal bleed 01/04/2012    Patient Active Problem List   Diagnosis Date Noted  . Intractable vomiting with nausea   . Bowel obstruction (HCC) 07/03/2017  . CKD (chronic kidney disease) stage 3, GFR 30-59 ml/min (HCC) 07/03/2017  . Dehydration 07/03/2017  . AKI (acute kidney injury) (HCC) 08/15/2016  . CKD (chronic kidney disease), stage III (HCC) 08/15/2016  . History of DVT (deep vein thrombosis) 08/15/2016  . Thrombocytopenia (HCC) 08/15/2016  . Generalized abdominal pain   . Small bowel obstruction (HCC)   . Pneumatosis intestinalis   . Hyperglycemia 01/05/2012  . Peripheral edema 01/04/2012  . Anemia 01/03/2012  . Metabolic acidosis 12/30/2011  . Hypocalcemia  12/30/2011  . Hypertriglyceridemia 12/30/2011  . Chronic pancreatitis (HCC) 12/29/2011  . Hepatic steatosis 12/29/2011  . HTN (hypertension) 10/15/2011  . Alcohol abuse 10/15/2011  . Obesity 10/15/2011    Past Surgical History:  Procedure Laterality Date  . chronic pancreatitiis    . COLON RESECTION    . colon surgery    . COLOSTOMY REVERSAL    . HERNIA REPAIR    . IVC FILTER PLACEMENT ([facility] HX)    . LITHOTRIPSY    . Mucous Fistula     2013  . PANCREAS SURGERY     half of pancreas removed  . PANCREATITIC FISTULA    . SBO          Home Medications    Prior to Admission medications   Medication Sig Start Date End Date Taking? Authorizing Provider  amitriptyline (ELAVIL) 150 MG tablet Take 150 mg by mouth at bedtime.  11/30/14   [provider]  Cholecalciferol (VITAMIN D PO) Take 1 tablet by mouth daily.    [provider]  CREON 36000 UNITS CPEP capsule Take 36,000-108,000 Units by mouth 3 (three) times daily with meals. 1 with snacks and 3 with meals 12/11/14   [provider]  docusate sodium (COLACE) 100 MG capsule Take 100 mg by mouth daily. For constipation 12/19/14   [provider]  ferrous sulfate 325 (65 FE) MG tablet Take 325 mg by mouth daily.    [provider]  folic acid (FOLVITE) 1 MG tablet  Take 1 mg by mouth daily. 04/13/15   [provider]  heparin 100-0.45 UNIT/ML-% infusion Inject 1,750 Units/hr into the vein continuous. 07/04/17   Catarina Hartshorn, MD  magnesium oxide (MAG-OX) 400 (241.3 MG) MG tablet Take 1 tablet by mouth 2 (two) times daily. 01/13/15   [provider]  morphine 4 MG/ML injection Inject 1 mL (4 mg total) into the vein every 2 (two) hours as needed for severe pain. 07/04/17   Catarina Hartshorn, MD  Multiple Vitamin (MULTIVITAMIN WITH MINERALS) TABS tablet Take 1 tablet by mouth daily.    [provider]  oxyCODONE (OXY IR/ROXICODONE) 5 MG immediate release tablet Take 5 mg by mouth 2  (two) times daily as needed for moderate pain or severe pain.  10/10/16   [provider]  oxyCODONE-acetaminophen (PERCOCET/ROXICET) 5-325 MG tablet 1 or 2 tabs PO q6h prn pain Patient taking differently: Take 1-2 tablets by mouth every 6 (six) hours as needed.  11/07/16   [name], DO  polyethylene glycol [facility] / GLYCOLAX) packet Take 17 g by mouth daily as needed for mild constipation. 02/10/17   Erick Blinks, MD  rivaroxaban (XARELTO) 10 MG TABS tablet Take 10 mg by mouth every morning. 10/30/16   [provider]  sildenafil (REVATIO) 20 MG tablet Take 20 mg by mouth as needed (1 hour prior to sexual activity).  08/10/16   [provider]  topiramate (TOPAMAX) 50 MG tablet Take 50-100 mg by mouth 2 (two) times daily. 1 tablet in the Am and 2 tablets at night    [provider]  vitamin B-12 (CYANOCOBALAMIN) 1000 MCG tablet Take 1,000 mcg by mouth every Monday, Wednesday, and Friday.     [provider]  diphenhydrAMINE (SOMINEX) 25 MG tablet Take 50 mg by mouth as needed. For allergic reaction  12/29/11  [provider]  nitroGLYCERIN (NITROSTAT) 0.4 MG SL tablet Place 1 tablet (0.4 mg total) under the tongue every 5 (five) minutes as needed for chest pain. 10/16/11 12/29/11  Hildred Laser, MD  omeprazole (PRILOSEC OTC) 20 MG tablet Take 1 tablet (20 mg total) by mouth daily. 10/16/11 12/29/11  Hildred Laser, MD    Family History Family History  Problem Relation Age of Onset  . Colon cancer Neg Hx     Social History Social History   Tobacco Use  . Smoking status: Never Smoker  . Smokeless tobacco: Never Used  Substance Use Topics  . Alcohol use: No    Alcohol/week: 4.2 oz    Types: 7 Shots of liquor per week    Comment: former  . Drug use: No     Allergies   Metrizamide; Ace inhibitors; Coconut flavor; Contrast media [iodinated diagnostic agents]; and Iodine   Review of Systems Review of Systems    Constitutional: Positive for activity change.  Respiratory: Negative for shortness of breath.   Cardiovascular: Negative for chest pain.  Gastrointestinal: Positive for abdominal pain and nausea. Negative for vomiting.  Allergic/Immunologic: Negative for immunocompromised state.  Hematological: Bruises/bleeds easily.  All other systems reviewed and are negative.    Physical Exam Updated Vital Signs BP 116/88 (BP Location: Left Arm)   Pulse 94   Temp 97.8 F (36.6 C) (Oral)   Resp 20   Ht  (1.803 m)   Wt 111.1 kg (245 lb)   SpO2 98%   BMI 34.17 kg/m   Physical Exam  Constitutional: He is oriented to person, place, and time. He appears well-developed.  HENT:  Head: Atraumatic.  Neck: Neck supple.  Cardiovascular: Normal rate.  Pulmonary/Chest: Effort normal.  Abdominal: Normal appearance. He exhibits distension. There is generalized tenderness. There is no rigidity, no rebound and no guarding.  Neurological: He is alert and oriented to person, place, and time.  Skin: Skin is warm.  Nursing note and vitals reviewed.    ED Treatments / Results  Labs (all labs ordered are listed, but only abnormal results are displayed) Labs Reviewed  COMPREHENSIVE METABOLIC PANEL - Abnormal; Notable for the following components:      Result Value   Glucose, Bld 133 (*)    Creatinine, Ser 2.07 (*)    GFR calc non Af Amer 36 (*)    GFR calc Af Amer 42 (*)    All other components within normal limits  CBC WITH DIFFERENTIAL/PLATELET - Abnormal; Notable for the following components:   WBC 3.3 (*)    Hemoglobin 12.0 (*)    HCT 37.3 (*)    MCH 25.8 (*)    RDW 18.3 (*)    Platelets 130 (*)    Neutro Abs 1.4 (*)    All other components within normal limits  LIPASE, BLOOD  I-STAT CG4 LACTIC ACID, ED    EKG None  Radiology Ct Abdomen Pelvis Wo Contrast  Result Date: 02/20/2018 CLINICAL DATA:  Acute onset of mild diarrhea and nausea. EXAM: CT ABDOMEN AND PELVIS WITHOUT  CONTRAST TECHNIQUE: Multidetector CT imaging of the abdomen and pelvis was performed following the standard protocol without IV contrast. COMPARISON:  CT of the abdomen and pelvis from 02/08/2017 FINDINGS: Lower chest: The visualized lung bases are grossly clear. The visualized portions of the mediastinum are unremarkable. Hepatobiliary: The liver is unremarkable in appearance. The gallbladder is unremarkable in appearance. The common bile duct remains normal in caliber. Pancreas: Chronic scarring is noted tracking about the pancreatic bed and Gerota's fascia on the left. Pancreatic tissue is not well characterized. Spleen: The spleen is unremarkable in appearance. Adrenals/Urinary Tract: The adrenal glands are unremarkable in appearance. The kidneys are within normal limits. There is no evidence of hydronephrosis. No renal or ureteral stones are identified. No perinephric stranding is seen. Stomach/Bowel: An ileocolic anastomosis is grossly unremarkable in appearance. The remaining colon is grossly unremarkable. The small bowel is unremarkable in appearance. The stomach is partially filled with contrast and is unremarkable in appearance. Vascular/Lymphatic: The abdominal aorta is unremarkable in appearance. The previously noted IVC filter has been removed. The inferior vena cava is grossly unremarkable. No retroperitoneal lymphadenopathy is seen. No pelvic sidewall lymphadenopathy is identified. Reproductive: The bladder is moderately distended and grossly unremarkable. The prostate remains normal in size. Other: Prominent postoperative scarring is noted along the paracolic gutters bilaterally, and along the anterior abdominal wall. Musculoskeletal: No acute osseous abnormalities are identified. The visualized musculature is unremarkable in appearance. IMPRESSION: 1. No acute abnormality seen to explain the patient's symptoms. 2. Chronic scarring noted tracking about the pancreatic bed and Gerota's fascia on the  left. Prominent postoperative scarring along the paracolic gutters bilaterally, and along the anterior abdominal wall. Electronically Signed   By: Roanna Raider M.D.   On: 02/20/2018 04:45    Procedures Procedures (including critical care time)  Medications Ordered in ED Medications  HYDROmorphone (DILAUDID) injection 1 mg (1 mg Intravenous Given 02/20/18 0230)  ondansetron (ZOFRAN) injection 4 mg (4 mg Intravenous Given 02/20/18 0229)  HYDROmorphone (DILAUDID) injection 1 mg (1 mg Intravenous Given 02/20/18 0525)     Initial Impression /  Assessment and Plan / ED Course  I have reviewed the triage vital signs and the nursing notes.  Pertinent labs & imaging results that were available during my care of the patient were reviewed by me and considered in my medical decision making (see chart for details).  Clinical Course as of Feb 21 648  Thu Feb 20, 2018  1610 Lipase is normal. CT scan of the abdomen is also reassuring. Outside of CKD, electrolytes are normal and there is no white count elevation. P.o. challenge has been initiated.  Patient already feels a lot better.  CT ABDOMEN PELVIS WO CONTRAST [AN]  0649 Pt reassessed. Pt's VSS and WNL. Pt's cap refill < 3 seconds. Pt has been hydrated in the ER and now passed po challenge. We will discharge with antiemetic. Strict ER return precautions have been discussed and pt will return if he is unable to tolerate fluids and symptoms are getting worse.    [AN]    Clinical Course User Index [AN] Derwood Kaplan, MD    47 year old male comes in with chief complaint of abdominal pain.  He has history of pancreatitis with multiple complications.  He also has history of DVT and is on blood thinners and is diabetic with CKD. On exam patient has abdominal distention with generalized tenderness.  Abdomen is firm. Clinical concerns are for ileus-SBO versus acute on chronic pancreatitis. Labs, fluids, pain meds, CT scan ordered.  Final Clinical  Impressions(s) / ED Diagnoses   Final diagnoses:  Chronic pancreatitis, unspecified pancreatitis type [facility])  Generalized abdominal pain    ED Discharge Orders    None       Derwood Kaplan, MD 02/20/18 [phone]

## 2018-02-20 NOTE — ED Notes (Signed)
Reminded patient that urine specimen is needed. Patient states that he does not have to go at this time. Patient given urinal and told to push call button.

## 2018-02-20 NOTE — ED Notes (Signed)
Pt given a coke to drink.

## 2018-02-20 NOTE — Discharge Instructions (Addendum)
All the results in the ER are normal, labs and imaging. We are not sure what is causing your symptoms, but suspect that it is chronic pancreatitis. The workup in the ER is not complete, and is limited to screening for life threatening and emergent conditions only, so please see a primary care doctor for further evaluation.  Please return to the ER if your symptoms worsen; you have increased pain, fevers, chills, inability to keep any medications down, confusion. Otherwise see the outpatient doctor as requested.

## 2018-04-03 ENCOUNTER — Other Ambulatory Visit: Payer: Self-pay

## 2018-04-03 ENCOUNTER — Encounter (HOSPITAL_COMMUNITY): Payer: Self-pay

## 2018-04-03 ENCOUNTER — Emergency Department (HOSPITAL_COMMUNITY): Payer: Medicare Other

## 2018-04-03 ENCOUNTER — Emergency Department (HOSPITAL_COMMUNITY)
Admission: EM | Admit: 2018-04-03 | Discharge: 2018-04-03 | Disposition: A | Discharge status: Home / Self Care | Payer: Medicare Other | Attending: Emergency Medicine | Admitting: Emergency Medicine

## 2018-04-03 DIAGNOSIS — G8929 Other chronic pain: Secondary | ICD-10-CM | POA: Diagnosis not present

## 2018-04-03 DIAGNOSIS — Z79899 Other long term (current) drug therapy: Secondary | ICD-10-CM | POA: Diagnosis not present

## 2018-04-03 DIAGNOSIS — K861 Other chronic pancreatitis: Secondary | ICD-10-CM | POA: Insufficient documentation

## 2018-04-03 DIAGNOSIS — N183 Chronic kidney disease, stage 3 (moderate): Secondary | ICD-10-CM | POA: Diagnosis not present

## 2018-04-03 DIAGNOSIS — I129 Hypertensive chronic kidney disease with stage 1 through stage 4 chronic kidney disease, or unspecified chronic kidney disease: Secondary | ICD-10-CM | POA: Diagnosis not present

## 2018-04-03 DIAGNOSIS — Z7901 Long term (current) use of anticoagulants: Secondary | ICD-10-CM | POA: Insufficient documentation

## 2018-04-03 DIAGNOSIS — R109 Unspecified abdominal pain: Secondary | ICD-10-CM

## 2018-04-03 DIAGNOSIS — R1031 Right lower quadrant pain: Secondary | ICD-10-CM | POA: Diagnosis present

## 2018-04-03 LAB — CBC WITH DIFFERENTIAL/PLATELET
Basophils Absolute: 0 10*3/uL (ref 0.0–0.1)
Basophils Relative: 0 %
EOS ABS: 0.1 10*3/uL (ref 0.0–0.7)
Eosinophils Relative: 2 %
HCT: 38.1 % — ABNORMAL LOW (ref 39.0–52.0)
Hemoglobin: 12.2 g/dL — ABNORMAL LOW (ref 13.0–17.0)
LYMPHS PCT: 32 %
Lymphs Abs: 1.2 10*3/uL (ref 0.7–4.0)
MCH: 26.9 pg (ref 26.0–34.0)
MCHC: 32 g/dL (ref 30.0–36.0)
MCV: 84.1 fL (ref 78.0–100.0)
Monocytes Absolute: 0.3 10*3/uL (ref 0.1–1.0)
Monocytes Relative: 9 %
Neutro Abs: 2.1 10*3/uL (ref 1.7–7.7)
Neutrophils Relative %: 57 %
PLATELETS: 125 10*3/uL — AB (ref 150–400)
RBC: 4.53 MIL/uL (ref 4.22–5.81)
RDW: 15.8 % — ABNORMAL HIGH (ref 11.5–15.5)
WBC: 3.6 10*3/uL — AB (ref 4.0–10.5)

## 2018-04-03 LAB — COMPREHENSIVE METABOLIC PANEL
ALT: 40 U/L (ref 0–44)
AST: 35 U/L (ref 15–41)
Albumin: 4 g/dL (ref 3.5–5.0)
Alkaline Phosphatase: 86 U/L (ref 38–126)
Anion gap: 8 (ref 5–15)
BUN: 19 mg/dL (ref 6–20)
CHLORIDE: 104 mmol/L (ref 98–111)
CO2: 27 mmol/L (ref 22–32)
CREATININE: 1.74 mg/dL — AB (ref 0.61–1.24)
Calcium: 9.3 mg/dL (ref 8.9–10.3)
GFR calc non Af Amer: 44 mL/min — ABNORMAL LOW (ref >60)
GFR, EST AFRICAN AMERICAN: 51 mL/min — AB (ref >60)
Glucose, Bld: 119 mg/dL — ABNORMAL HIGH (ref 70–99)
POTASSIUM: 4.4 mmol/L (ref 3.5–5.1)
SODIUM: 139 mmol/L (ref 135–145)
Total Bilirubin: 0.4 mg/dL (ref 0.3–1.2)
Total Protein: 7.5 g/dL (ref 6.5–8.1)

## 2018-04-03 LAB — URINALYSIS, ROUTINE W REFLEX MICROSCOPIC
Bilirubin Urine: NEGATIVE
Glucose, UA: NEGATIVE mg/dL
HGB URINE DIPSTICK: NEGATIVE
Ketones, ur: NEGATIVE mg/dL
LEUKOCYTES UA: NEGATIVE
Nitrite: NEGATIVE
PROTEIN: NEGATIVE mg/dL
Specific Gravity, Urine: 1.018 (ref 1.005–1.030)
pH: 6 (ref 5.0–8.0)

## 2018-04-03 LAB — LIPASE, BLOOD: Lipase: 28 U/L (ref 11–51)

## 2018-04-03 MED ORDER — SODIUM CHLORIDE 0.9 % IV BOLUS
1000.0000 mL | Freq: Once | INTRAVENOUS | Status: AC
  Administered 2018-04-03: 1000 mL via INTRAVENOUS

## 2018-04-03 MED ORDER — ONDANSETRON HCL 4 MG/2ML IJ SOLN
4.0000 mg | Freq: Once | INTRAMUSCULAR | Status: AC
  Administered 2018-04-03: 4 mg via INTRAVENOUS
  Filled 2018-04-03: qty 2

## 2018-04-03 MED ORDER — HYDROMORPHONE HCL 1 MG/ML IJ SOLN
1.0000 mg | Freq: Once | INTRAMUSCULAR | Status: AC
  Administered 2018-04-03: 1 mg via INTRAVENOUS
  Filled 2018-04-03: qty 1

## 2018-04-03 NOTE — ED Notes (Signed)
Patient transported to CT 

## 2018-04-03 NOTE — ED Notes (Signed)
Patient taken to CT and returned to room at this time. CT tech states that IV bag was disconnected to run CT and since bag was empty was not reconnected.

## 2018-04-03 NOTE — ED Provider Notes (Signed)
[facility] EMERGENCY DEPARTMENT Provider Note   CSN: [id number] Arrival date & time: 04/03/18  0048     History   Chief Complaint Chief Complaint  Patient presents with  . Abdominal Pain    HPI Peter [name] is a 47 y.o. male.   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  Patient states he always has some abdominal pain for which he takes oxycodone but the pain today is more severe.  It is in the right side of his abdomen and feels similar to previous episodes of pancreatitis.  He has had nausea but no vomiting.  No fever, pain with urination or blood in the urine.  No chest pain or shortness of breath.  The history is provided by the patient.  Abdominal Pain   Associated symptoms include nausea. Pertinent negatives include fever, vomiting, dysuria, hematuria, headaches and arthralgias.    Past Medical History:  Diagnosis Date  . Bowel obstruction (HCC)   . Chronic abdominal pain   . Chronic back pain   . CKD (chronic kidney disease)   . DVT (deep venous thrombosis) (HCC)    lower extremity  . Hypertension   . Ileus (HCC) 01/03/2012  . Kidney stones   . Migraine headache   . Pain management   . Pancreatitis   . Pancytopenia (HCC)   . Retroperitoneal bleed 01/04/2012    Patient Active Problem List   Diagnosis Date Noted  . Intractable vomiting with nausea   . Bowel obstruction (HCC) 07/03/2017  . CKD (chronic kidney disease) stage 3, GFR 30-59 ml/min (HCC) 07/03/2017  . Dehydration 07/03/2017  . AKI (acute kidney injury) (HCC) 08/15/2016  . CKD (chronic kidney disease), stage III (HCC) 08/15/2016  . History of DVT (deep vein thrombosis) 08/15/2016  . Thrombocytopenia (HCC) 08/15/2016  . Generalized abdominal pain   . Small bowel obstruction (HCC)   . Pneumatosis intestinalis   . Hyperglycemia 01/05/2012  . Peripheral edema 01/04/2012   . Anemia 01/03/2012  . Metabolic acidosis 12/30/2011  . Hypocalcemia 12/30/2011  . Hypertriglyceridemia 12/30/2011  . Chronic pancreatitis (HCC) 12/29/2011  . Hepatic steatosis 12/29/2011  . HTN (hypertension) 10/15/2011  . Alcohol abuse 10/15/2011  . Obesity 10/15/2011    Past Surgical History:  Procedure Laterality Date  . chronic pancreatitiis    . COLON RESECTION    . colon surgery    . COLOSTOMY REVERSAL    . HERNIA REPAIR    . IVC FILTER PLACEMENT ([facility] HX)    . LITHOTRIPSY    . Mucous Fistula     2013  . PANCREAS SURGERY     half of pancreas removed  . PANCREATITIC FISTULA    . SBO          Home Medications    Prior to Admission medications   Medication Sig Start Date End Date Taking? Authorizing Provider  amitriptyline (ELAVIL) 150 MG tablet Take 150 mg by mouth at bedtime.  11/30/14   [provider]  Cholecalciferol (VITAMIN D PO) Take 1 tablet by mouth daily.    [provider]  CREON 36000 UNITS CPEP capsule Take 36,000-108,000 Units by mouth 3 (three) times daily with meals. 1 with snacks and 3 with meals 12/11/14   [provider]  docusate sodium (COLACE) 100 MG capsule Take 100 mg by mouth daily. For constipation 12/19/14   [provider]  ferrous sulfate 325 (65 FE)  MG tablet Take 325 mg by mouth daily.    [provider]  folic acid (FOLVITE) 1 MG tablet Take 1 mg by mouth daily. 04/13/15   [provider]  heparin 100-0.45 UNIT/ML-% infusion Inject 1,750 Units/hr into the vein continuous. 07/04/17   Catarina Hartshorn, MD  magnesium oxide (MAG-OX) 400 (241.3 MG) MG tablet Take 1 tablet by mouth 2 (two) times daily. 01/13/15   [provider]  morphine 4 MG/ML injection Inject 1 mL (4 mg total) into the vein every 2 (two) hours as needed for severe pain. 07/04/17   Catarina Hartshorn, MD  Multiple Vitamin (MULTIVITAMIN WITH MINERALS) TABS tablet Take 1 tablet by mouth daily.    [provider]  oxyCODONE  (OXY IR/ROXICODONE) 5 MG immediate release tablet Take 5 mg by mouth 2 (two) times daily as needed for moderate pain or severe pain.  10/10/16   [provider]  oxyCODONE-acetaminophen (PERCOCET/ROXICET) 5-325 MG tablet 1 or 2 tabs PO q6h prn pain Patient taking differently: Take 1-2 tablets by mouth every 6 (six) hours as needed.  11/07/16   [name], DO  polyethylene glycol [facility] / GLYCOLAX) packet Take 17 g by mouth daily as needed for mild constipation. 02/10/17   Erick Blinks, MD  rivaroxaban (XARELTO) 10 MG TABS tablet Take 10 mg by mouth every morning. 10/30/16   [provider]  sildenafil (REVATIO) 20 MG tablet Take 20 mg by mouth as needed (1 hour prior to sexual activity).  08/10/16   [provider]  topiramate (TOPAMAX) 50 MG tablet Take 50-100 mg by mouth 2 (two) times daily. 1 tablet in the Am and 2 tablets at night    [provider]  vitamin B-12 (CYANOCOBALAMIN) 1000 MCG tablet Take 1,000 mcg by mouth every Monday, Wednesday, and Friday.     [provider]  diphenhydrAMINE (SOMINEX) 25 MG tablet Take 50 mg by mouth as needed. For allergic reaction  12/29/11  [provider]  nitroGLYCERIN (NITROSTAT) 0.4 MG SL tablet Place 1 tablet (0.4 mg total) under the tongue every 5 (five) minutes as needed for chest pain. 10/16/11 12/29/11  Hildred Laser, MD  omeprazole (PRILOSEC OTC) 20 MG tablet Take 1 tablet (20 mg total) by mouth daily. 10/16/11 12/29/11  Hildred Laser, MD    Family History Family History  Problem Relation Age of Onset  . Colon cancer Neg Hx     Social History Social History   Tobacco Use  . Smoking status: Never Smoker  . Smokeless tobacco: Never Used  Substance Use Topics  . Alcohol use: No    Alcohol/week: 4.2 oz    Types: 7 Shots of liquor per week    Comment: former  . Drug use: No     Allergies   Metrizamide; Ace inhibitors; Coconut flavor; Contrast media [iodinated diagnostic  agents]; and Iodine   Review of Systems Review of Systems  Constitutional: Negative for activity change, appetite change and fever.  HENT: Negative for congestion.   Eyes: Negative for visual disturbance.  Respiratory: Negative for cough, chest tightness and shortness of breath.   Gastrointestinal: Positive for abdominal pain and nausea. Negative for vomiting.  Genitourinary: Negative for dysuria, hematuria and testicular pain.  Musculoskeletal: Negative for arthralgias and back pain.  Skin: Negative for rash.  Neurological: Negative for dizziness, weakness, numbness and headaches.   all other systems are negative except as noted in the HPI and PMH.     Physical Exam Updated Vital  Signs BP (!) 137/99   Pulse (!) 103   Temp 98.2 F (36.8 C) (Oral)   Resp 20   Ht 5\' 10"  (1.778 m)   Wt 114.3 kg (252 lb)   SpO2 96%   BMI 36.16 kg/m   Physical Exam  Constitutional: He is oriented to person, place, and time. He appears well-developed and well-nourished. No distress.  HENT:  Head: Normocephalic and atraumatic.  Mouth/Throat: Oropharynx is clear and moist. No oropharyngeal exudate.  Eyes: Pupils are equal, round, and reactive to light. Conjunctivae and EOM are normal.  Neck: Normal range of motion. Neck supple.  No meningismus.  Cardiovascular: Normal rate, regular rhythm, normal heart sounds and intact distal pulses.  No murmur heard. Pulmonary/Chest: Effort normal and breath sounds normal. No respiratory distress.  Abdominal: Soft. There is tenderness. There is guarding. There is no rebound.  Multiple well-healed surgical incisions, right-sided abdominal tenderness with voluntary guarding.  Genitourinary:  Genitourinary Comments: No testicular pain  Musculoskeletal: Normal range of motion. He exhibits no edema or tenderness.  Neurological: He is alert and oriented to person, place, and time. No cranial nerve deficit. He exhibits normal muscle tone. Coordination normal.  No  ataxia on finger to nose bilaterally. No pronator drift. 5/5 strength throughout. CN 2-12 intact.Equal grip strength. Sensation intact.   Skin: Skin is warm.  Psychiatric: He has a normal mood and affect. His behavior is normal.  Nursing note and vitals reviewed.    ED Treatments / Results  Labs (all labs ordered are listed, but only abnormal results are displayed) Labs Reviewed  CBC WITH DIFFERENTIAL/PLATELET - Abnormal; Notable for the following components:      Result Value   WBC 3.6 (*)    Hemoglobin 12.2 (*)    HCT 38.1 (*)    RDW 15.8 (*)    Platelets 125 (*)    All other components within normal limits  COMPREHENSIVE METABOLIC PANEL - Abnormal; Notable for the following components:   Glucose, Bld 119 (*)    Creatinine, Ser 1.74 (*)    GFR calc non Af Amer 44 (*)    GFR calc Af Amer 51 (*)    All other components within normal limits  LIPASE, BLOOD  URINALYSIS, ROUTINE W REFLEX MICROSCOPIC    EKG None  Radiology Ct Abdomen Pelvis Wo Contrast  Result Date: 04/03/2018 CLINICAL DATA:  47 y/o M; history of pancreatitis with partial pancreatectomy. Worsening pain with nausea. EXAM: CT ABDOMEN AND PELVIS WITHOUT CONTRAST TECHNIQUE: Multidetector CT imaging of the abdomen and pelvis was performed following the standard protocol without IV contrast. COMPARISON:  02/20/2018 CT abdomen and pelvis. FINDINGS: Lower chest: No acute abnormality. Hepatobiliary: No focal liver abnormality is seen. No gallstones, gallbladder wall thickening, or biliary dilatation. Pancreas: Stable extensive scarring/postoperative change within the retroperitoneum with thickening of Gerota's fascia, pericolic fascia, and adhesion of pancreatic tail to the spleen. No acute inflammatory change or fluid collection identified. Spleen: Normal in size without focal abnormality. Adrenals/Urinary Tract: Adrenal glands are unremarkable. Kidneys are normal, without renal calculi, focal lesion, or hydronephrosis. Bladder  is unremarkable. Stomach/Bowel: Stomach is within normal limits. Appendix not identified, no pericecal inflammation. No evidence of bowel wall thickening, distention, or inflammatory changes. Vascular/Lymphatic: No significant vascular findings are present. No enlarged abdominal or pelvic lymph nodes. Reproductive: Prostate is unremarkable. Other: Postsurgical changes within ventral anterior abdominal wall with scarring of subcutaneous fat. Musculoskeletal: No fracture is seen. IMPRESSION: 1. No acute process identified on this noncontrast examination. 2.  Stable postoperative changes in the abdominal wall and extensive scarring throughout pericolic gutters, Gerota's fascia, and the pancreas bed. Electronically Signed   By: Mitzi Hansen M.D.   On: 04/03/2018 03:14    Procedures Procedures (including critical care time)  Medications Ordered in ED Medications  sodium chloride 0.9 % bolus 1,000 mL (1,000 mLs Intravenous New Bag/Given 04/03/18 0201)  HYDROmorphone (DILAUDID) injection 1 mg (1 mg Intravenous Given 04/03/18 0201)  ondansetron (ZOFRAN) injection 4 mg (4 mg Intravenous Given 04/03/18 0201)     Initial Impression / Assessment and Plan / ED Course  I have reviewed the triage vital signs and the nursing notes.  Pertinent labs & imaging results that were available during my care of the patient were reviewed by me and considered in my medical decision making (see chart for details).    \Patient with history of chronic pancreatitis and multiple previous surgeries presenting with exacerbation of his typical pain.  Patient given IV fluids, pain medication and symptom control. Labs show normal lipase normal LFTs.  No leukocytosis. Creatinine elevated but has improved from previous.  CT scan is reassuring and shows stable postsurgical changes without evidence of acute abnormality.  Patient states his pain is improved and he is able to tolerate p.o.  Abdomen is soft without  peritoneal signs and patient feels improved.  Advise follow-up with his GI specialist.  Return precautions given.  Final Clinical Impressions(s) / ED Diagnoses   Final diagnoses:  Chronic abdominal pain  Chronic pancreatitis, unspecified pancreatitis type [facility])    ED Discharge Orders    None       Glynn Octave, MD 04/03/18 [phone]

## 2018-04-03 NOTE — Discharge Instructions (Addendum)
Your testing today is reassuring.  You should follow clear liquid diet for the next several days until your pain is improved.  Take your medications as prescribed and follow-up with your doctor.  Return to the ED if you develop new or worsening symptoms.

## 2018-04-03 NOTE — ED Triage Notes (Signed)
Pt reports history of pancreatitis, states has pain most of the time, but worse recently, nausea, no vomiting

## 2018-06-14 ENCOUNTER — Emergency Department (HOSPITAL_COMMUNITY): Payer: Medicare Other

## 2018-06-14 ENCOUNTER — Other Ambulatory Visit: Payer: Self-pay

## 2018-06-14 ENCOUNTER — Emergency Department (HOSPITAL_COMMUNITY)
Admission: EM | Admit: 2018-06-14 | Discharge: 2018-06-14 | Disposition: A | Discharge status: Home / Self Care | Payer: Medicare Other | Attending: Emergency Medicine | Admitting: Emergency Medicine

## 2018-06-14 ENCOUNTER — Encounter (HOSPITAL_COMMUNITY): Payer: Self-pay | Admitting: Emergency Medicine

## 2018-06-14 DIAGNOSIS — G8929 Other chronic pain: Secondary | ICD-10-CM | POA: Diagnosis not present

## 2018-06-14 DIAGNOSIS — Z7901 Long term (current) use of anticoagulants: Secondary | ICD-10-CM | POA: Diagnosis not present

## 2018-06-14 DIAGNOSIS — K625 Hemorrhage of anus and rectum: Secondary | ICD-10-CM

## 2018-06-14 DIAGNOSIS — I129 Hypertensive chronic kidney disease with stage 1 through stage 4 chronic kidney disease, or unspecified chronic kidney disease: Secondary | ICD-10-CM | POA: Diagnosis not present

## 2018-06-14 DIAGNOSIS — Z79899 Other long term (current) drug therapy: Secondary | ICD-10-CM | POA: Diagnosis not present

## 2018-06-14 DIAGNOSIS — N183 Chronic kidney disease, stage 3 (moderate): Secondary | ICD-10-CM | POA: Insufficient documentation

## 2018-06-14 DIAGNOSIS — R109 Unspecified abdominal pain: Secondary | ICD-10-CM | POA: Insufficient documentation

## 2018-06-14 LAB — COMPREHENSIVE METABOLIC PANEL
ALT: 38 U/L (ref 0–44)
AST: 28 U/L (ref 15–41)
Albumin: 4.1 g/dL (ref 3.5–5.0)
Alkaline Phosphatase: 79 U/L (ref 38–126)
Anion gap: 9 (ref 5–15)
BILIRUBIN TOTAL: 0.5 mg/dL (ref 0.3–1.2)
BUN: 18 mg/dL (ref 6–20)
CHLORIDE: 108 mmol/L (ref 98–111)
CO2: 21 mmol/L — ABNORMAL LOW (ref 22–32)
CREATININE: 1.69 mg/dL — AB (ref 0.61–1.24)
Calcium: 9.1 mg/dL (ref 8.9–10.3)
GFR, EST AFRICAN AMERICAN: 53 mL/min — AB (ref >60)
GFR, EST NON AFRICAN AMERICAN: 46 mL/min — AB (ref >60)
Glucose, Bld: 154 mg/dL — ABNORMAL HIGH (ref 70–99)
Potassium: 3.6 mmol/L (ref 3.5–5.1)
Sodium: 138 mmol/L (ref 135–145)
TOTAL PROTEIN: 7.6 g/dL (ref 6.5–8.1)

## 2018-06-14 LAB — URINALYSIS, ROUTINE W REFLEX MICROSCOPIC
BILIRUBIN URINE: NEGATIVE
GLUCOSE, UA: NEGATIVE mg/dL
Hgb urine dipstick: NEGATIVE
Ketones, ur: NEGATIVE mg/dL
LEUKOCYTES UA: NEGATIVE
NITRITE: NEGATIVE
Protein, ur: NEGATIVE mg/dL
SPECIFIC GRAVITY, URINE: 1.019 (ref 1.005–1.030)
pH: 5 (ref 5.0–8.0)

## 2018-06-14 LAB — CBC
HCT: 39.3 % (ref 39.0–52.0)
Hemoglobin: 13.1 g/dL (ref 13.0–17.0)
MCH: 27.5 pg (ref 26.0–34.0)
MCHC: 33.3 g/dL (ref 30.0–36.0)
MCV: 82.6 fL (ref 78.0–100.0)
Platelets: 137 10*3/uL — ABNORMAL LOW (ref 150–400)
RBC: 4.76 MIL/uL (ref 4.22–5.81)
RDW: 14.8 % (ref 11.5–15.5)
WBC: 3.4 10*3/uL — ABNORMAL LOW (ref 4.0–10.5)

## 2018-06-14 LAB — POC OCCULT BLOOD, ED: Fecal Occult Bld: POSITIVE — AB

## 2018-06-14 LAB — LIPASE, BLOOD: LIPASE: 29 U/L (ref 11–51)

## 2018-06-14 MED ORDER — FENTANYL CITRATE (PF) 100 MCG/2ML IJ SOLN
100.0000 ug | Freq: Once | INTRAMUSCULAR | Status: AC
  Administered 2018-06-14: 100 ug via INTRAMUSCULAR
  Filled 2018-06-14: qty 2

## 2018-06-14 NOTE — Discharge Instructions (Signed)
Continue to take your usual prescriptions as previously directed.  Begin to take over the counter fiber products (ie: Citucel, Metamucil) or stool softener (colace), as directed on packaging, for the next several weeks.  Call your regular medical doctor or your regular General Surgeon on Monday to schedule a follow up appointment within the next 3 days.  Call your GI doctor on Monday to schedule a follow up appointment within the next week. Return to the Emergency Department immediately if worsening.

## 2018-06-14 NOTE — ED Triage Notes (Signed)
Patient c/o left upper and lower abd pain x2-3 days. Per patient nausea and diarrhea. Denies any vomiting or fevers. Patient reports hx of pancreatitis in which this feels similar. Patient also reports bright red blood in stools x5 days.

## 2018-06-14 NOTE — ED Provider Notes (Signed)
[facility] EMERGENCY DEPARTMENT Provider Note   CSN: [id number] Arrival date & time: 06/14/18  0845     History   Chief Complaint Chief Complaint  Patient presents with  . Abdominal Pain    HPI Peter [name] is a 47 y.o. male.   Abdominal Pain      Pt was seen at 0955.  Per pt, c/o gradual onset and persistence of constant acute flair of his chronic abd "pain" for the past 2 days.  Has been associated with nausea.  Describes the abd pain as "throbbing." Pt also states he has had rectal bleeding for the past 5 days.  Pt states the rectal bleeding began after he had a large/hard BM 5 days ago. Pt states the stool is "brown" and "has blood on it." Pt also sees blood on the toilet paper when he wipes. Endorses hx of similar symptoms, but unable to recall dx.  Denies vomiting/diarrhea, no fevers, no back pain, no rash, no CP/SOB, no black stools. The symptoms have been associated with no other complaints. The patient has a significant history of similar symptoms previously, recently being evaluated for this complaint and multiple prior evals for same.        Past Medical History:  Diagnosis Date  . Bowel obstruction (HCC)   . Chronic abdominal pain   . Chronic back pain   . CKD (chronic kidney disease)   . DVT (deep venous thrombosis) (HCC)    lower extremity  . Hypertension   . Ileus (HCC) 01/03/2012  . Kidney stones   . Migraine headache   . Pain management   . Pancreatitis   . Pancytopenia (HCC)   . Retroperitoneal bleed 01/04/2012    Patient Active Problem List   Diagnosis Date Noted  . Intractable vomiting with nausea   . Bowel obstruction (HCC) 07/03/2017  . CKD (chronic kidney disease) stage 3, GFR 30-59 ml/min (HCC) 07/03/2017  . Dehydration 07/03/2017  . AKI (acute kidney injury) (HCC) 08/15/2016  . CKD (chronic kidney disease), stage III (HCC) 08/15/2016  . History of DVT (deep vein thrombosis) 08/15/2016  . Thrombocytopenia (HCC) 08/15/2016  . Generalized  abdominal pain   . Small bowel obstruction (HCC)   . Pneumatosis intestinalis   . Hyperglycemia 01/05/2012  . Peripheral edema 01/04/2012  . Anemia 01/03/2012  . Metabolic acidosis 12/30/2011  . Hypocalcemia 12/30/2011  . Hypertriglyceridemia 12/30/2011  . Chronic pancreatitis (HCC) 12/29/2011  . Hepatic steatosis 12/29/2011  . HTN (hypertension) 10/15/2011  . Alcohol abuse 10/15/2011  . Obesity 10/15/2011    Past Surgical History:  Procedure Laterality Date  . chronic pancreatitiis    . COLON RESECTION    . colon surgery    . COLOSTOMY REVERSAL    . HERNIA REPAIR    . IVC FILTER PLACEMENT ([facility] HX)    . LITHOTRIPSY    . Mucous Fistula     2013  . PANCREAS SURGERY     half of pancreas removed  . PANCREATITIC FISTULA    . SBO          Home Medications    Prior to Admission medications   Medication Sig Start Date End Date Taking? Authorizing Provider  amitriptyline (ELAVIL) 150 MG tablet Take 150 mg by mouth at bedtime.  11/30/14   [provider]  Cholecalciferol (VITAMIN D PO) Take 1 tablet by mouth daily.    [provider]  CREON 36000 UNITS CPEP capsule Take 36,000-108,000 Units by mouth 3 (three) times  daily with meals. 1 with snacks and 3 with meals 12/11/14   [provider]  docusate sodium (COLACE) 100 MG capsule Take 100 mg by mouth daily. For constipation 12/19/14   [provider]  ferrous sulfate 325 (65 FE) MG tablet Take 325 mg by mouth daily.    [provider]  folic acid (FOLVITE) 1 MG tablet Take 1 mg by mouth daily. 04/13/15   [provider]  heparin 100-0.45 UNIT/ML-% infusion Inject 1,750 Units/hr into the vein continuous. 07/04/17   Catarina Hartshorn, MD  magnesium oxide (MAG-OX) 400 (241.3 MG) MG tablet Take 1 tablet by mouth 2 (two) times daily. 01/13/15   [provider]  morphine 4 MG/ML injection Inject 1 mL (4 mg total) into the vein every 2 (two) hours as needed for severe pain. 07/04/17   Catarina Hartshorn, MD  Multiple Vitamin (MULTIVITAMIN WITH MINERALS) TABS tablet Take 1 tablet by mouth daily.    [provider]  oxyCODONE (OXY IR/ROXICODONE) 5 MG immediate release tablet Take 5 mg by mouth 2 (two) times daily as needed for moderate pain or severe pain.  10/10/16   [provider]  oxyCODONE-acetaminophen (PERCOCET/ROXICET) 5-325 MG tablet 1 or 2 tabs PO q6h prn pain Patient taking differently: Take 1-2 tablets by mouth every 6 (six) hours as needed.  11/07/16   [name], DO  polyethylene glycol [facility] / GLYCOLAX) packet Take 17 g by mouth daily as needed for mild constipation. 02/10/17   Erick Blinks, MD  rivaroxaban (XARELTO) 10 MG TABS tablet Take 10 mg by mouth every morning. 10/30/16   [provider]  sildenafil (REVATIO) 20 MG tablet Take 20 mg by mouth as needed (1 hour prior to sexual activity).  08/10/16   [provider]  topiramate (TOPAMAX) 50 MG tablet Take 50-100 mg by mouth 2 (two) times daily. 1 tablet in the Am and 2 tablets at night    [provider]  vitamin B-12 (CYANOCOBALAMIN) 1000 MCG tablet Take 1,000 mcg by mouth every Monday, Wednesday, and Friday.     [provider]  diphenhydrAMINE (SOMINEX) 25 MG tablet Take 50 mg by mouth as needed. For allergic reaction  12/29/11  [provider]  nitroGLYCERIN (NITROSTAT) 0.4 MG SL tablet Place 1 tablet (0.4 mg total) under the tongue every 5 (five) minutes as needed for chest pain. 10/16/11 12/29/11  Hildred Laser, MD  omeprazole (PRILOSEC OTC) 20 MG tablet Take 1 tablet (20 mg total) by mouth daily. 10/16/11 12/29/11  Hildred Laser, MD    Family History Family History  Problem Relation Age of Onset  . Colon cancer Neg Hx     Social History Social History   Tobacco Use  . Smoking status: Never Smoker  . Smokeless tobacco: Never Used  Substance Use Topics  . Alcohol use: Not Currently  . Drug use: No     Allergies   Metrizamide;  Ace inhibitors; Coconut flavor; Contrast media [iodinated diagnostic agents]; and Iodine   Review of Systems Review of Systems  Gastrointestinal: Positive for abdominal pain.  ROS: Statement: All systems negative except as marked or noted in the HPI; Constitutional: Negative for fever and chills. ; ; Eyes: Negative for eye pain, redness and discharge. ; ; ENMT: Negative for ear pain, hoarseness, nasal congestion, sinus pressure and sore throat. ; ; Cardiovascular: Negative for chest pain, palpitations, diaphoresis, dyspnea and peripheral edema. ; ; Respiratory: Negative for cough, wheezing and stridor. ; ; Gastrointestinal:  Negative for vomiting, diarrhea, hematemesis, jaundice and +nausea, chronic abd pain, rectal bleeding. . ; ; Genitourinary: Negative for dysuria, flank pain and hematuria. ; ; Musculoskeletal: Negative for back pain and neck pain. Negative for swelling and trauma.; ; Skin: Negative for pruritus, rash, abrasions, blisters, bruising and skin lesion.; ; Neuro: Negative for headache, lightheadedness and neck stiffness. Negative for weakness, altered level of consciousness, altered mental status, extremity weakness, paresthesias, involuntary movement, seizure and syncope.        Physical Exam Updated Vital Signs BP 121/86 (BP Location: Right Arm)   Pulse (!) 105   Temp 98.1 F (36.7 C) (Oral)   Resp 16   Ht 5\' 11"  (1.803 m)   Wt 115.2 kg   SpO2 99%   BMI 35.43 kg/m    BP 121/86 (BP Location: Right Arm)   Pulse 98   Temp 98.1 F (36.7 C) (Oral)   Resp 16   Ht 5\' 11"  (1.803 m)   Wt 115.2 kg   SpO2 96%   BMI 35.43 kg/m    Physical Exam 1000: Physical examination:  Nursing notes reviewed; Vital signs and O2 SAT reviewed;  Constitutional: Well developed, Well nourished, Well hydrated, In no acute distress; Head:  Normocephalic, atraumatic; Eyes: EOMI, PERRL, No scleral icterus; ENMT: Mouth and pharynx normal, Mucous membranes moist; Neck: Supple, Full range of motion,  No lymphadenopathy; Cardiovascular: Regular rate and rhythm, No gallop; Respiratory: Breath sounds clear & equal bilaterally, No wheezes.  Speaking full sentences with ease, Normal respiratory effort/excursion; Chest: Nontender, Movement normal; Abdomen: Soft, Nontender when distracted. Then TTP LLQ and mid-epigastric areas. No rebound or guarding (see note below). Nondistended, Normal bowel sounds. Rectal exam performed w/permission of pt and ED RN chaperone present.  Anal tone normal.  Non-tender, soft brown stool in rectal vault, heme positive.  No obvious fissures, no external hemorrhoids.; Genitourinary: No CVA tenderness; Extremities: Peripheral pulses normal, No tenderness, No edema, No calf edema or asymmetry.; Neuro: AA&Ox3, Major CN grossly intact.  Speech clear. No gross focal motor or sensory deficits in extremities. Climbs on and off stretcher easily by himself. Gait steady..; Skin: Color normal, Warm, Dry.   ED Treatments / Results  Labs (all labs ordered are listed, but only abnormal results are displayed)   EKG None  Radiology   Procedures Procedures (including critical care time)  Medications Ordered in ED Medications  fentaNYL (SUBLIMAZE) injection 100 mcg (has no administration in time range)     Initial Impression / Assessment and Plan / ED Course  I have reviewed the triage vital signs and the nursing notes.  Pertinent labs & imaging results that were available during my care of the patient were reviewed by me and considered in my medical decision making (see chart for details).  MDM Reviewed: previous chart, nursing note and vitals Reviewed previous: labs Interpretation: labs, x-ray and CT scan    Results for orders placed or performed during the hospital encounter of 06/14/18  Lipase, blood  Result Value Ref Range   Lipase 29 11 - 51 U/L  Comprehensive metabolic panel  Result Value Ref Range   Sodium 138 135 - 145 mmol/L   Potassium 3.6 3.5 - 5.1  mmol/L   Chloride 108 98 - 111 mmol/L   CO2 21 (L) 22 - 32 mmol/L   Glucose, Bld 154 (H) 70 - 99 mg/dL   BUN 18 6 - 20 mg/dL   Creatinine, Ser 1.61 (H) 0.61 - 1.24 mg/dL   Calcium 9.1  8.9 - 10.3 mg/dL   Total Protein 7.6 6.5 - 8.1 g/dL   Albumin 4.1 3.5 - 5.0 g/dL   AST 28 15 - 41 U/L   ALT 38 0 - 44 U/L   Alkaline Phosphatase 79 38 - 126 U/L   Total Bilirubin 0.5 0.3 - 1.2 mg/dL   GFR calc non Af Amer 46 (L) >60 mL/min   GFR calc Af Amer 53 (L) >60 mL/min   Anion gap 9 5 - 15  CBC  Result Value Ref Range   WBC 3.4 (L) 4.0 - 10.5 K/uL   RBC 4.76 4.22 - 5.81 MIL/uL   Hemoglobin 13.1 13.0 - 17.0 g/dL   HCT 21.3 08.6 - 57.8 %   MCV 82.6 78.0 - 100.0 fL   MCH 27.5 26.0 - 34.0 pg   MCHC 33.3 30.0 - 36.0 g/dL   RDW 46.9 62.9 - 52.8 %   Platelets 137 (L) 150 - 400 K/uL  Urinalysis, Routine w reflex microscopic  Result Value Ref Range   Color, Urine YELLOW YELLOW   APPearance CLEAR CLEAR   Specific Gravity, Urine 1.019 1.005 - 1.030   pH 5.0 5.0 - 8.0   Glucose, UA NEGATIVE NEGATIVE mg/dL   Hgb urine dipstick NEGATIVE NEGATIVE   Bilirubin Urine NEGATIVE NEGATIVE   Ketones, ur NEGATIVE NEGATIVE mg/dL   Protein, ur NEGATIVE NEGATIVE mg/dL   Nitrite NEGATIVE NEGATIVE   Leukocytes, UA NEGATIVE NEGATIVE  POC occult blood, ED  Result Value Ref Range   Fecal Occult Bld POSITIVE (A) NEGATIVE   Ct Abdomen Pelvis Wo Contrast Result Date: 06/14/2018 CLINICAL DATA:  Left-sided abdominal pain for 2-3 days. Nausea and diarrhea. EXAM: CT ABDOMEN AND PELVIS WITHOUT CONTRAST TECHNIQUE: Multidetector CT imaging of the abdomen and pelvis was performed following the standard protocol without IV contrast. COMPARISON:  04/03/2018 FINDINGS: Lower chest: Left middle lobe atelectasis Hepatobiliary: No focal liver abnormality is seen. No gallstones, gallbladder wall thickening, or biliary dilatation. Pancreas: Stable extensive scarring/ postoperative changes within the retroperitoneal with thickening of  the Gerota's fascia, left pericolic fascia and adhesion of the pancreatic tail to the spleen. No acute inflammatory changes. The pancreatic tissue is not well characterized. Spleen: Normal in size without focal abnormality. Adrenals/Urinary Tract: Adrenal glands are unremarkable. Kidneys are without focal lesion, or hydronephrosis. Tiny nonobstructive left lower pole renal calculus. Probable subcentimeter cyst in the midpole region of the left kidney. The urinary bladder is decompressed with lobulated nodular contour of its wall. Stomach/Bowel: Stomach is within normal limits. No evidence of appendicitis. No evidence of bowel obstruction. Stable postsurgical changes with bulbous appearance of the left upper quadrant ileo colic anastomosis. Vascular/Lymphatic: Aortic atherosclerosis. No enlarged abdominal or pelvic lymph nodes. Reproductive: Prostate is unremarkable. Other: No abdominal wall hernia or acute abnormality. No abdominopelvic ascites. Stable postsurgical scarring of the anterior abdominal wall. Musculoskeletal: No acute or significant osseous findings. IMPRESSION: No acute abnormality seen to explain patient's left abdominal pain. Extensive but stable in appearance postsurgical changes in the abdomen with scarring along the pancreatic bed, pericolic fascia and anterior abdominal wall. Stable appearance of the ileo-colic anastomosis. Micro lobular appearance of the decompressed urinary bladder. Query neurogenic bladder. Electronically Signed   By: Ted Mcalpine M.D.   On: 06/14/2018 11:22   Dg Chest 2 View Result Date: 06/14/2018 CLINICAL DATA:  Upper abd pain, hx of pancreatitis EXAM: CHEST - 2 VIEW COMPARISON:  09/02/2017 FINDINGS: Improved aeration. Chronic linear atelectasis/consolidation in the left lower lobe. Chronic blunting of  left lateral costophrenic angle. Heart size and mediastinal contours are within normal limits. No effusion.  No pneumothorax. Visualized bones unremarkable.  IMPRESSION: 1. No acute findings. Chronic linear atelectasis or or scarring in the left lower lobe. Electronically Signed   By: Corlis Leak M.D.   On: 06/14/2018 11:23     1000:  On my initial exam, when pt was distracted by ED staff coming in/out of exam room; pt's abd exam was benign. I then asked him again where his pain was, and re-examined him. He then reacted when I re-examined him, c/o pain LUQ and mid-epigastric areas.   1220:  Labs per baseline. Workup otherwise reassuring. H/H stable after pt's reported 5 days of rectal bleeding and VS are stable. Pt has tol PO well while in the ED without N/V.  No stooling while in the ED. No rectal bleeding while in the ED. Abd benign, VSS. Feels better and wants to go home now. No clear indication for admission at this time. Dx and testing d/w pt.  Questions answered.  Verb understanding, agreeable to d/c home with outpt f/u.      Final Clinical Impressions(s) / ED Diagnoses   Final diagnoses:  None    ED Discharge Orders    None       [name], DO 06/17/18 1610

## 2018-09-11 ENCOUNTER — Emergency Department (HOSPITAL_COMMUNITY)
Admission: EM | Admit: 2018-09-11 | Discharge: 2018-09-11 | Disposition: A | Discharge status: Home / Self Care | Payer: Medicare Other | Attending: Emergency Medicine | Admitting: Emergency Medicine

## 2018-09-11 ENCOUNTER — Encounter (HOSPITAL_COMMUNITY): Payer: Self-pay | Admitting: Emergency Medicine

## 2018-09-11 ENCOUNTER — Other Ambulatory Visit: Payer: Self-pay

## 2018-09-11 ENCOUNTER — Emergency Department (HOSPITAL_COMMUNITY): Payer: Medicare Other

## 2018-09-11 DIAGNOSIS — K529 Noninfective gastroenteritis and colitis, unspecified: Secondary | ICD-10-CM | POA: Diagnosis not present

## 2018-09-11 DIAGNOSIS — I129 Hypertensive chronic kidney disease with stage 1 through stage 4 chronic kidney disease, or unspecified chronic kidney disease: Secondary | ICD-10-CM | POA: Diagnosis not present

## 2018-09-11 DIAGNOSIS — Z7901 Long term (current) use of anticoagulants: Secondary | ICD-10-CM | POA: Insufficient documentation

## 2018-09-11 DIAGNOSIS — N183 Chronic kidney disease, stage 3 (moderate): Secondary | ICD-10-CM | POA: Insufficient documentation

## 2018-09-11 DIAGNOSIS — K625 Hemorrhage of anus and rectum: Secondary | ICD-10-CM | POA: Diagnosis present

## 2018-09-11 DIAGNOSIS — Z79899 Other long term (current) drug therapy: Secondary | ICD-10-CM | POA: Insufficient documentation

## 2018-09-11 LAB — CBC WITH DIFFERENTIAL/PLATELET
ABS IMMATURE GRANULOCYTES: 0.01 10*3/uL (ref 0.00–0.07)
Basophils Absolute: 0 10*3/uL (ref 0.0–0.1)
Basophils Relative: 1 %
Eosinophils Absolute: 0.1 10*3/uL (ref 0.0–0.5)
Eosinophils Relative: 3 %
HEMATOCRIT: 44.7 % (ref 39.0–52.0)
Hemoglobin: 13.9 g/dL (ref 13.0–17.0)
IMMATURE GRANULOCYTES: 0 %
LYMPHS ABS: 1.6 10*3/uL (ref 0.7–4.0)
LYMPHS PCT: 39 %
MCH: 26 pg (ref 26.0–34.0)
MCHC: 31.1 g/dL (ref 30.0–36.0)
MCV: 83.6 fL (ref 80.0–100.0)
Monocytes Absolute: 0.4 10*3/uL (ref 0.1–1.0)
Monocytes Relative: 9 %
NEUTROS ABS: 2 10*3/uL (ref 1.7–7.7)
NEUTROS PCT: 48 %
Platelets: 162 10*3/uL (ref 150–400)
RBC: 5.35 MIL/uL (ref 4.22–5.81)
RDW: 15.1 % (ref 11.5–15.5)
WBC: 4.1 10*3/uL (ref 4.0–10.5)
nRBC: 0 % (ref 0.0–0.2)

## 2018-09-11 LAB — COMPREHENSIVE METABOLIC PANEL
ALT: 44 U/L (ref 0–44)
ANION GAP: 8 (ref 5–15)
AST: 40 U/L (ref 15–41)
Albumin: 4.8 g/dL (ref 3.5–5.0)
Alkaline Phosphatase: 92 U/L (ref 38–126)
BUN: 15 mg/dL (ref 6–20)
CHLORIDE: 107 mmol/L (ref 98–111)
CO2: 22 mmol/L (ref 22–32)
Calcium: 10.1 mg/dL (ref 8.9–10.3)
Creatinine, Ser: 1.96 mg/dL — ABNORMAL HIGH (ref 0.61–1.24)
GFR calc Af Amer: 45 mL/min — ABNORMAL LOW (ref >60)
GFR, EST NON AFRICAN AMERICAN: 39 mL/min — AB (ref >60)
GLUCOSE: 91 mg/dL (ref 70–99)
POTASSIUM: 3.9 mmol/L (ref 3.5–5.1)
Sodium: 137 mmol/L (ref 135–145)
TOTAL PROTEIN: 8.9 g/dL — AB (ref 6.5–8.1)
Total Bilirubin: 0.5 mg/dL (ref 0.3–1.2)

## 2018-09-11 LAB — POC OCCULT BLOOD, ED: Fecal Occult Bld: POSITIVE — AB

## 2018-09-11 LAB — TYPE AND SCREEN
ABO/RH(D): A POS
Antibody Screen: NEGATIVE

## 2018-09-11 LAB — LIPASE, BLOOD: LIPASE: 35 U/L (ref 11–51)

## 2018-09-11 LAB — I-STAT CG4 LACTIC ACID, ED: LACTIC ACID, VENOUS: 1.08 mmol/L (ref 0.5–1.9)

## 2018-09-11 MED ORDER — SODIUM CHLORIDE 0.9 % IV BOLUS
1000.0000 mL | Freq: Once | INTRAVENOUS | Status: AC
  Administered 2018-09-11: 1000 mL via INTRAVENOUS

## 2018-09-11 MED ORDER — HYDROMORPHONE HCL 1 MG/ML IJ SOLN
0.5000 mg | Freq: Once | INTRAMUSCULAR | Status: AC
  Administered 2018-09-11: 0.5 mg via INTRAVENOUS
  Filled 2018-09-11: qty 1

## 2018-09-11 MED ORDER — AMOXICILLIN-POT CLAVULANATE 875-125 MG PO TABS
1.0000 | ORAL_TABLET | Freq: Once | ORAL | Status: AC
  Administered 2018-09-11: 1 via ORAL
  Filled 2018-09-11: qty 1

## 2018-09-11 MED ORDER — AMOXICILLIN-POT CLAVULANATE 875-125 MG PO TABS: 1.0000 | ORAL_TABLET | Freq: Two times a day (BID) | ORAL | 0 refills | Status: AC

## 2018-09-11 MED ORDER — SODIUM CHLORIDE 0.9 % IV SOLN
INTRAVENOUS | Status: DC
  Administered 2018-09-11: 21:00:00 via INTRAVENOUS

## 2018-09-11 NOTE — ED Triage Notes (Signed)
Pt c/o generalized abd pain with bloody stools x 1 week, pt reports started as bright red blood and has transitioned to dark red blood, pt reports stool as become loose x 2 days and reports dizziness, pt reports he has had bloody stools before as he takes xarelto but has never been this bad, denies n/v/fever

## 2018-09-11 NOTE — Discharge Instructions (Signed)
[facility] or go to the emergency department if you should develop severe or worsening symptoms, otherwise you [facility] the next week as long as you are getting better.  If you are getting worse with increasing pain, vomiting, fever, bleeding or any other severe or worsening symptoms come back to the hospital immediately.  Take Augmentin by mouth twice a day for the next 10 days

## 2018-09-11 NOTE — ED Provider Notes (Signed)
[facility] EMERGENCY DEPARTMENT Provider Note   CSN: [id number] Arrival date & time: 09/11/18  1848     History   Chief Complaint Chief Complaint  Patient presents with  . Abdominal Pain  . Rectal Bleeding    HPI Peter [name] is a 47 y.o. male.  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  The patient reports that he started having rectal bleeding a couple of years ago, it has been intermittent and only in very small amounts however over the last week he has had some progressive bleeding, it has now become more voluminous, it has been bright red and now is becoming more dark red and having larger volumes of blood.  It is not just on the paper.  No pain with bowel movements.  He has not been vomiting but has had some progressive lower abdominal pain as well.  There has been some mild lightheadedness but no shortness of breath, no nausea, no syncope.  Past Medical History:  Diagnosis Date  . Bowel obstruction (HCC)   . Chronic abdominal pain   . Chronic back pain   . CKD (chronic kidney disease)   . DVT (deep venous thrombosis) (HCC)    lower extremity  . Hypertension   . Ileus (HCC) 01/03/2012  . Kidney stones   . Migraine headache   . Pain management   . Pancreatitis   . Pancytopenia (HCC)   . Retroperitoneal bleed 01/04/2012     Patient Active Problem List   Diagnosis Date Noted  . Intractable vomiting with nausea   . Bowel obstruction (HCC) 07/03/2017  . CKD (chronic kidney disease) stage 3, GFR 30-59 ml/min (HCC) 07/03/2017  . Dehydration 07/03/2017  . AKI (acute kidney injury) (HCC) 08/15/2016  . CKD (chronic kidney disease), stage III (HCC) 08/15/2016  . History of DVT (deep vein thrombosis) 08/15/2016  . Thrombocytopenia (HCC) 08/15/2016  . Generalized abdominal pain   . Small bowel obstruction (HCC)   . Pneumatosis intestinalis   . Hyperglycemia 01/05/2012  . Peripheral edema 01/04/2012  . Anemia 01/03/2012  . Metabolic acidosis 12/30/2011  . Hypocalcemia 12/30/2011  . Hypertriglyceridemia 12/30/2011  . Chronic pancreatitis (HCC) 12/29/2011  . Hepatic steatosis 12/29/2011  . HTN (hypertension) 10/15/2011  . Alcohol abuse 10/15/2011  . Obesity 10/15/2011    Past Surgical History:  Procedure Laterality Date  . chronic pancreatitiis    . COLON RESECTION    . colon surgery    . COLOSTOMY REVERSAL    . HERNIA REPAIR    . IVC FILTER PLACEMENT ([facility] HX)    . LITHOTRIPSY    . Mucous Fistula     2013  . PANCREAS SURGERY     half of pancreas removed  . PANCREATITIC FISTULA    . SBO          Home  Medications    Prior to Admission medications   Medication Sig Start Date End Date Taking? Authorizing Provider  amitriptyline (ELAVIL) 150 MG tablet Take 150 mg by mouth at bedtime.  11/30/14  Yes [provider]  cholecalciferol (VITAMIN D3) 25 MCG (1000 UT) tablet Take 3,000 Units by mouth every morning.   Yes [provider]  CREON 36000 UNITS CPEP capsule Take 36,000-108,000 Units by mouth 3 (three) times daily with meals. 1 with snacks and 3 with meals 12/11/14  Yes [provider]  docusate sodium (COLACE) 100 MG capsule Take 100 mg by mouth 2 (two) times daily. For constipation 12/19/14  Yes [provider]  ferrous sulfate 325 (65 FE) MG tablet Take 325  mg by mouth daily.   Yes [provider]  folic acid (FOLVITE) 1 MG tablet Take 1 mg by mouth every morning.  04/13/15  Yes [provider]  magnesium oxide (MAG-OX) 400 (241.3 MG) MG tablet Take 1 tablet by mouth 2 (two) times daily. 01/13/15  Yes [provider]  Multiple Vitamin (MULTIVITAMIN WITH MINERALS) TABS tablet Take 1 tablet by mouth every morning.    Yes [provider]  polyethylene glycol (MIRALAX / GLYCOLAX) packet Take 17 g by mouth daily as needed for mild constipation. 02/10/17  Yes Erick Blinks, MD  rivaroxaban (XARELTO) 20 MG TABS tablet Take 20 mg by mouth every morning.  10/30/16  Yes [provider]  topiramate (TOPAMAX) 50 MG tablet Take 50-100 mg by mouth 2 (two) times daily. 1 tablet in the Am and 2 tablets at night   Yes [provider]  vitamin B-12 (CYANOCOBALAMIN) 1000 MCG tablet Take 1,000 mcg by mouth every Monday, Wednesday, and Friday.    Yes [provider]  amoxicillin-clavulanate (AUGMENTIN) 875-125 MG tablet Take 1 tablet by mouth every 12 (twelve) hours for 10 days. 09/11/18 09/21/18  Eber Hong, MD  diphenhydrAMINE (SOMINEX) 25 MG tablet Take 50 mg by mouth as needed. For allergic reaction  12/29/11  [provider]  nitroGLYCERIN (NITROSTAT) 0.4 MG SL tablet Place 1 tablet (0.4 mg total) under the tongue every 5 (five) minutes as needed for chest pain. 10/16/11 12/29/11  Hildred Laser, MD  omeprazole (PRILOSEC OTC) 20 MG tablet Take 1 tablet (20 mg total) by mouth daily. 10/16/11 12/29/11  Hildred Laser, MD    Family History Family History  Problem Relation Age of Onset  . Colon cancer Neg Hx     Social History Social History   Tobacco Use  . Smoking status: Never Smoker  . Smokeless tobacco: Never Used  Substance Use Topics  . Alcohol use: Not Currently  . Drug use: No     Allergies   Metrizamide; Ace inhibitors; Coconut oil; Coconut flavor; Contrast media [iodinated  diagnostic agents]; and Iodine   Review of Systems Review of Systems  All other systems reviewed and are negative.    Physical Exam Updated Vital Signs BP (!) 132/92   Pulse (!) 106   Temp 97.9 F (36.6 C) (Oral)   Resp 16   Ht 1.778 m (5\' 10" )   Wt 117 kg   SpO2 96%   BMI 37.02 kg/m   Physical Exam  Constitutional: He appears well-developed and well-nourished. No distress.  HENT:  Head: Normocephalic and atraumatic.  Mouth/Throat: Oropharynx is clear and moist. No oropharyngeal exudate.  Eyes: Pupils are equal, round, and reactive to light. Conjunctivae and EOM are normal. Right eye exhibits no discharge. Left  eye exhibits no discharge. No scleral icterus.  Neck: Normal range of motion. Neck supple. No JVD present. No thyromegaly present.  Cardiovascular: Normal rate, regular rhythm, normal heart sounds and intact distal pulses. Exam reveals no gallop and no friction rub.  No murmur heard. Pulmonary/Chest: Effort normal and breath sounds normal. No respiratory distress. He has no wheezes. He has no rales.  Abdominal: Soft. Bowel sounds are normal. He exhibits no distension and no mass. There is no tenderness.  The patient has a nondistended abdomen, multiple surgical scars which are all well-healed, he has mild to moderate tenderness in the left lower quadrant and suprapubic area, moderate tenderness in the epigastrium and minimal if any tenderness on the right side.  Genitourinary:  Genitourinary Comments: Rectal exam performed, no palpable hemorrhoids or fissures, no gross blood, endoscopy results below.  Hemoccult positive immediately  Musculoskeletal: Normal range of motion. He exhibits no edema or tenderness.  Lymphadenopathy:    He has no cervical adenopathy.  Neurological: He is alert. Coordination normal.  Skin: Skin is warm and dry. No rash noted. No erythema.  Psychiatric: He has a normal mood and affect. His behavior is normal.  Nursing note and vitals  reviewed.    ED Treatments / Results  Labs (all labs ordered are listed, but only abnormal results are displayed) Labs Reviewed  COMPREHENSIVE METABOLIC PANEL - Abnormal; Notable for the following components:      Result Value   Creatinine, Ser 1.96 (*)    Total Protein 8.9 (*)    GFR calc non Af Amer 39 (*)    GFR calc Af Amer 45 (*)    All other components within normal limits  POC OCCULT BLOOD, ED - Abnormal; Notable for the following components:   Fecal Occult Bld POSITIVE (*)    All other components within normal limits  CBC WITH DIFFERENTIAL/PLATELET  LIPASE, BLOOD  I-STAT CG4 LACTIC ACID, ED  I-STAT CG4 LACTIC ACID, ED  TYPE AND SCREEN    EKG None  Radiology Ct Abdomen Pelvis Wo Contrast  Result Date: 09/11/2018 CLINICAL DATA:  Diffuse abdominal pain and bloody stools for the past week. The blood was initially bright red and transitioned to dark red. Currently taking Xarelto. EXAM: CT ABDOMEN AND PELVIS WITHOUT CONTRAST TECHNIQUE: Multidetector CT imaging of the abdomen and pelvis was performed following the standard protocol without IV contrast. COMPARISON:  06/14/2018. FINDINGS: Lower chest: Stable mild scarring at the left lung base. Hepatobiliary: No focal liver abnormality is seen. No gallstones, gallbladder wall thickening, or biliary dilatation. Pancreas: Stable retroperitoneal scarring with fusion of the tail of the pancreas to the spleen. The pancreas continues to have a thin, stretched appearance. Spleen: Unremarkable except for the adjacent scarring described above. Adrenals/Urinary Tract: Stable small left renal cyst tiny left renal calculus. Normal appearing right kidney, adrenal glands, ureters and urinary bladder. Stomach/Bowel: Stable again demonstrated is an ileocolic anastomosis in the left mid abdomen. The previously demonstrated fluid distention of that portion of the bowel is no longer seen. Currently, there is diffuse wall thickening at the anastomosis with  a maximum thickness of 1.7 cm. Surgically absent right colon and proximal transverse colon. Unremarkable stomach Vascular/Lymphatic: Minimal atheromatous arterial calcifications. A mildly prominent left inguinal lymph node is unchanged, left abdominal and pelvic subcutaneous varices are again demonstrated. Reproductive: Prostate is unremarkable. Other: Left paracolic gutter soft tissue thickening compatible with scarring without significant change. Midline surgical scars. Old right anterior ostomy scar. Musculoskeletal: Mild lumbar and lower thoracic  spine degenerative changes. IMPRESSION: 1. Interval diffuse wall thickening at the ileocolic anastomosis with a maximum thickness of 1.7 cm. This could be due to scarring. A neoplastic process could also have this appearance. These possibilities could be resolved with direct colonoscopic visualization. 2. Otherwise, stable postoperative changes, as described above. Electronically Signed   By: Beckie SaltsSteven  Reid M.D.   On: 09/11/2018 21:37    Procedures Procedures (including critical care time)  Procedure Note:  Anoscopy  Risks benefits alternatives of the procedure given to the patient Verbal Consent obtained Patient placed in the lateral decubitus position Anoscopy performed Findings  No hemorrhoids appreciated, no fissures, no masses, orange / red mucoid stool in vault. Patient tolerated procedure without any complaints   Medications Ordered in ED Medications  0.9 %  sodium chloride infusion ( Intravenous Stopped 09/11/18 2224)  sodium chloride 0.9 % bolus 1,000 mL (has no administration in time range)  amoxicillin-clavulanate (AUGMENTIN) 875-125 MG per tablet 1 tablet (has no administration in time range)  HYDROmorphone (DILAUDID) injection 0.5 mg (0.5 mg Intravenous Given 09/11/18 2158)     Initial Impression / Assessment and Plan / ED Course  I have reviewed the triage vital signs and the nursing notes.  Pertinent labs & imaging results that  were available during my care of the patient were reviewed by me and considered in my medical decision making (see chart for details).  The patient has essentially an abnormal CT scan showing an area of thickening of the bowel wall at the ileocolic junction, I have discussed his care with a general surgeon, this [facility] Dr. Drue DunAshburn, she recommends that the patient be placed on antibiotics and follow-up in the clinic or in the emergency department if symptoms worsen but agrees with no need for inpatient treatment without fever tachycardia hypotension leukocytosis or any other high risk features.  I have informed the patient of this, I have given him IV fluids and antibiotics and repeat his abdominal exam which was rather unremarkable overall.  He does have some tenderness in the lower abdomen which is consistent with his prior exam however after getting the medications as above he has improved.  The patient is agreeable to return should symptoms worsen and agreeable to follow-up in the outpatient setting.  He will be given Augmentin twice daily as an outpatient.       Final Clinical Impressions(s) / ED Diagnoses   Final diagnoses:  Ileocolitis    ED Discharge Orders         Ordered    amoxicillin-clavulanate (AUGMENTIN) 875-125 MG tablet  Every 12 hours     09/11/18 2220           Eber HongMiller, Julien Berryman, MD 09/11/18 2226

## 2018-10-18 ENCOUNTER — Encounter (HOSPITAL_COMMUNITY): Payer: Self-pay | Admitting: Emergency Medicine

## 2018-10-18 ENCOUNTER — Emergency Department (HOSPITAL_COMMUNITY)
Admission: EM | Admit: 2018-10-18 | Discharge: 2018-10-18 | Disposition: A | Discharge status: Home / Self Care | Payer: Medicare Other | Attending: Emergency Medicine | Admitting: Emergency Medicine

## 2018-10-18 ENCOUNTER — Emergency Department (HOSPITAL_COMMUNITY): Payer: Medicare Other

## 2018-10-18 ENCOUNTER — Other Ambulatory Visit: Payer: Self-pay

## 2018-10-18 DIAGNOSIS — M25572 Pain in left ankle and joints of left foot: Secondary | ICD-10-CM | POA: Diagnosis present

## 2018-10-18 DIAGNOSIS — Z7901 Long term (current) use of anticoagulants: Secondary | ICD-10-CM | POA: Diagnosis not present

## 2018-10-18 DIAGNOSIS — N183 Chronic kidney disease, stage 3 (moderate): Secondary | ICD-10-CM | POA: Diagnosis not present

## 2018-10-18 DIAGNOSIS — Z79899 Other long term (current) drug therapy: Secondary | ICD-10-CM | POA: Insufficient documentation

## 2018-10-18 DIAGNOSIS — I129 Hypertensive chronic kidney disease with stage 1 through stage 4 chronic kidney disease, or unspecified chronic kidney disease: Secondary | ICD-10-CM | POA: Diagnosis not present

## 2018-10-18 DIAGNOSIS — F1010 Alcohol abuse, uncomplicated: Secondary | ICD-10-CM | POA: Insufficient documentation

## 2018-10-18 LAB — BASIC METABOLIC PANEL
Anion gap: 5 (ref 5–15)
BUN: 17 mg/dL (ref 6–20)
CO2: 27 mmol/L (ref 22–32)
Calcium: 9.4 mg/dL (ref 8.9–10.3)
Chloride: 105 mmol/L (ref 98–111)
Creatinine, Ser: 1.8 mg/dL — ABNORMAL HIGH (ref 0.61–1.24)
GFR calc Af Amer: 50 mL/min — ABNORMAL LOW (ref >60)
GFR calc non Af Amer: 43 mL/min — ABNORMAL LOW (ref >60)
Glucose, Bld: 103 mg/dL — ABNORMAL HIGH (ref 70–99)
Potassium: 4.4 mmol/L (ref 3.5–5.1)
Sodium: 137 mmol/L (ref 135–145)

## 2018-10-18 LAB — CBC WITH DIFFERENTIAL/PLATELET
Abs Immature Granulocytes: 0.01 10*3/uL (ref 0.00–0.07)
Basophils Absolute: 0 10*3/uL (ref 0.0–0.1)
Basophils Relative: 0 %
EOS ABS: 0.1 10*3/uL (ref 0.0–0.5)
Eosinophils Relative: 2 %
HCT: 41.3 % (ref 39.0–52.0)
Hemoglobin: 12.5 g/dL — ABNORMAL LOW (ref 13.0–17.0)
IMMATURE GRANULOCYTES: 0 %
Lymphocytes Relative: 25 %
Lymphs Abs: 1.1 10*3/uL (ref 0.7–4.0)
MCH: 25.8 pg — ABNORMAL LOW (ref 26.0–34.0)
MCHC: 30.3 g/dL (ref 30.0–36.0)
MCV: 85.3 fL (ref 80.0–100.0)
Monocytes Absolute: 0.4 10*3/uL (ref 0.1–1.0)
Monocytes Relative: 10 %
Neutro Abs: 2.7 10*3/uL (ref 1.7–7.7)
Neutrophils Relative %: 63 %
Platelets: 133 10*3/uL — ABNORMAL LOW (ref 150–400)
RBC: 4.84 MIL/uL (ref 4.22–5.81)
RDW: 14.9 % (ref 11.5–15.5)
WBC: 4.3 10*3/uL (ref 4.0–10.5)
nRBC: 0 % (ref 0.0–0.2)

## 2018-10-18 LAB — URIC ACID: Uric Acid, Serum: 7.8 mg/dL (ref 3.7–8.6)

## 2018-10-18 MED ORDER — HYDROCODONE-ACETAMINOPHEN 5-325 MG PO TABS: ORAL_TABLET | ORAL | 0 refills | Status: DC

## 2018-10-18 NOTE — ED Notes (Signed)
Here for eval of tender L medial ankle Hx Of DVT No pain to calf when leg flexed  Tender to medial ankle area

## 2018-10-18 NOTE — ED Triage Notes (Signed)
Patient complains of left ankle pain that started this morning. Patient states history of DVT in bother legs. Patient denies injury or trauma to ankle.

## 2018-10-18 NOTE — Discharge Instructions (Addendum)
Elevate and apply ice packs on and off to your ankle.  Wear the brace as needed for weightbearing.  Follow-up with your primary doctor for recheck or return here for any worsening symptoms such as swelling or pain progressing up your calf

## 2018-10-19 NOTE — ED Provider Notes (Signed)
[facility] EMERGENCY DEPARTMENT Provider Note   CSN: [id number] Arrival date & time: 10/18/18  1041     History   Chief Complaint Chief Complaint  Patient presents with  . Ankle Pain    HPI Maceo ProJohn C Hume is a 48 y.o. male.  HPI   Maceo ProJohn C Speakman is a 48 y.o. male who presents to the Emergency Department complaining of pain to his medial left ankle.  States he woke with throbbing pain that worsens with movement and weight bearing.  Pain does not radiate.  He denies known trauma, swelling of ankle, calf pain or swelling, excessive warmth or redness of the skin, and pain proximal to the ankle.  He reports having a hx of DVT's and concerned whether this is related.  He is anticoagulated on Xarelto    Past Medical History:  Diagnosis Date  . Bowel obstruction (HCC)   . Chronic abdominal pain   . Chronic back pain   . CKD (chronic kidney disease)   . DVT (deep venous thrombosis) (HCC)    lower extremity  . Hypertension   . Ileus (HCC) 01/03/2012  . Kidney stones   . Migraine headache   . Pain management   . Pancreatitis   . Pancytopenia (HCC)   . Retroperitoneal bleed 01/04/2012    Patient Active Problem List   Diagnosis Date Noted  . Intractable vomiting with nausea   . Bowel obstruction (HCC) 07/03/2017  . CKD (chronic kidney disease) stage 3, GFR 30-59 ml/min (HCC) 07/03/2017  . Dehydration 07/03/2017  . AKI (acute kidney injury) (HCC) 08/15/2016  . CKD (chronic kidney disease), stage III (HCC) 08/15/2016  . History of DVT (deep vein thrombosis) 08/15/2016  . Thrombocytopenia (HCC) 08/15/2016  . Generalized abdominal pain   . Small bowel obstruction (HCC)   . Pneumatosis intestinalis   . Hyperglycemia 01/05/2012  . Peripheral edema 01/04/2012  . Anemia 01/03/2012  . Metabolic acidosis 12/30/2011  . Hypocalcemia 12/30/2011  . Hypertriglyceridemia 12/30/2011  . Chronic pancreatitis (HCC) 12/29/2011  . Hepatic steatosis 12/29/2011  . HTN (hypertension)  10/15/2011  . Alcohol abuse 10/15/2011  . Obesity 10/15/2011    Past Surgical History:  Procedure Laterality Date  . chronic pancreatitiis    . COLON RESECTION    . colon surgery    . COLOSTOMY REVERSAL    . HERNIA REPAIR    . IVC FILTER PLACEMENT ([facility] HX)    . LITHOTRIPSY    . Mucous Fistula     2013  . PANCREAS SURGERY     half of pancreas removed  . PANCREATITIC FISTULA    . SBO          Home Medications    Prior to Admission medications   Medication Sig Start Date End Date Taking? Authorizing Provider  amitriptyline (ELAVIL) 150 MG tablet Take 150 mg by mouth at bedtime.  11/30/14  Yes [provider]  cholecalciferol (VITAMIN D3) 25 MCG (1000 UT) tablet Take 3,000 Units by mouth every morning.   Yes [provider]  CREON 36000 UNITS CPEP capsule Take 36,000-108,000 Units by mouth 3 (three) times daily with meals. 1 with snacks and 3 with meals 12/11/14  Yes [provider]  docusate sodium (COLACE) 100 MG capsule Take 100 mg by mouth 2 (two) times daily. For constipation 12/19/14  Yes [provider]  ferrous sulfate 325 (65 FE) MG tablet Take 325 mg by mouth daily.   Yes [provider]  folic acid (FOLVITE) 1  MG tablet Take 1 mg by mouth every morning.  04/13/15  Yes [provider]  magnesium oxide (MAG-OX) 400 (241.3 MG) MG tablet Take 1 tablet by mouth 2 (two) times daily. 01/13/15  Yes [provider]  Multiple Vitamin (MULTIVITAMIN WITH MINERALS) TABS tablet Take 1 tablet by mouth every morning.    Yes [provider]  polyethylene glycol (MIRALAX / GLYCOLAX) packet Take 17 g by mouth daily as needed for mild constipation. 02/10/17  Yes Erick BlinksMemon, Jehanzeb, MD  rivaroxaban (XARELTO) 20 MG TABS tablet Take 20 mg by mouth every morning.  10/30/16  Yes [provider]  topiramate (TOPAMAX) 50 MG tablet Take 50-100 mg by mouth 2 (two) times daily. 1 tablet in the Am and 2 tablets at night   Yes  [provider]  vitamin B-12 (CYANOCOBALAMIN) 1000 MCG tablet Take 1,000 mcg by mouth every Monday, Wednesday, and Friday.    Yes [provider]  HYDROcodone-acetaminophen (NORCO/VICODIN) 5-325 MG tablet Take one tab po q 4 hrs prn pain 10/18/18   Waverly Chavarria, PA-C  diphenhydrAMINE (SOMINEX) 25 MG tablet Take 50 mg by mouth as needed. For allergic reaction  12/29/11  [provider]  nitroGLYCERIN (NITROSTAT) 0.4 MG SL tablet Place 1 tablet (0.4 mg total) under the tongue every 5 (five) minutes as needed for chest pain. 10/16/11 12/29/11  Hildred LaserNwaokocha, Charles N, MD  omeprazole (PRILOSEC OTC) 20 MG tablet Take 1 tablet (20 mg total) by mouth daily. 10/16/11 12/29/11  Hildred LaserNwaokocha, Charles N, MD    Family History Family History  Problem Relation Age of Onset  . Colon cancer Neg Hx     Social History Social History   Tobacco Use  . Smoking status: Never Smoker  . Smokeless tobacco: Never Used  Substance Use Topics  . Alcohol use: Not Currently  . Drug use: No     Allergies   Metrizamide; Ace inhibitors; Coconut oil; Coconut flavor; Contrast media [iodinated diagnostic agents]; and Iodine   Review of Systems Review of Systems  Constitutional: Negative for chills and fever.  Respiratory: Negative for shortness of breath.   Cardiovascular: Negative for chest pain and leg swelling.  Musculoskeletal: Positive for arthralgias (left ankle pain). Negative for joint swelling.  Skin: Negative for color change and wound.  Neurological: Negative for weakness and numbness.     Physical Exam Updated Vital Signs BP (!) 120/91 (BP Location: Left Arm)   Pulse (!) 103   Temp 98.1 F (36.7 C) (Temporal)   Resp 16   Ht 5\' 10"  (1.778 m)   Wt 118.4 kg   SpO2 97%   BMI 37.45 kg/m   Physical Exam Vitals signs and nursing note reviewed.  Constitutional:      General: He is not in acute distress.    Appearance: Normal appearance. He is not toxic-appearing.  HENT:      Head: Atraumatic.  Neck:     Musculoskeletal: Normal range of motion and neck supple.  Cardiovascular:     Rate and Rhythm: Normal rate and regular rhythm.     Pulses: Normal pulses.     Comments: DP and PT pulses are strong and palpable bilaterally Pulmonary:     Effort: Pulmonary effort is normal. No respiratory distress.  Musculoskeletal: Normal range of motion.        General: Tenderness present. No swelling, deformity or signs of injury.     Comments: Mild ttp of the joint of the medial left ankle.  No skin changes,  edema, or excessive warmth.  No calf pain or edema.  No palpable cord.    Lymphadenopathy:     Cervical: No cervical adenopathy.  Skin:    General: Skin is warm.     Capillary Refill: Capillary refill takes less than 2 seconds.     Findings: No erythema or rash.  Neurological:     General: No focal deficit present.     Mental Status: He is alert.     Sensory: No sensory deficit.     Motor: No weakness.  Psychiatric:        Mood and Affect: Mood normal.      ED Treatments / Results  Labs (all labs ordered are listed, but only abnormal results are displayed) Labs Reviewed  BASIC METABOLIC PANEL - Abnormal; Notable for the following components:      Result Value   Glucose, Bld 103 (*)    Creatinine, Ser 1.80 (*)    GFR calc non Af Amer 43 (*)    GFR calc Af Amer 50 (*)    All other components within normal limits  CBC WITH DIFFERENTIAL/PLATELET - Abnormal; Notable for the following components:   Hemoglobin 12.5 (*)    MCH 25.8 (*)    Platelets 133 (*)    All other components within normal limits  URIC ACID    EKG None  Radiology Dg Ankle Complete Left  Result Date: 10/18/2018 CLINICAL DATA:  Left ankle pain and swelling.  No injury. EXAM: LEFT ANKLE COMPLETE - 3+ VIEW COMPARISON:  Left ankle x-rays dated March 17, 2008. FINDINGS: No acute fracture or dislocation. The ankle mortise is symmetric. The talar dome is intact. No tibiotalar joint effusion.  Joint spaces are preserved. Bone mineralization is normal. Soft tissues are unremarkable. IMPRESSION: Negative. Electronically Signed   By: Obie Dredge M.D.   On: 10/18/2018 12:05    Procedures Procedures (including critical care time)  Medications Ordered in ED Medications - No data to display   Initial Impression / Assessment and Plan / ED Course  I have reviewed the triage vital signs and the nursing notes.  Pertinent labs & imaging results that were available during my care of the patient were reviewed by me and considered in my medical decision making (see chart for details).     Pt with localized pain to medial ankle.  No concerning sx's for septic joint.  No sx's proximal to the ankle.  NV intact  ASO applied for support, he agrees to RICE therapy and close out pt f/u.  Strict return precautions discussed.     Final Clinical Impressions(s) / ED Diagnoses   Final diagnoses:  Acute left ankle pain    ED Discharge Orders         Ordered    HYDROcodone-acetaminophen (NORCO/VICODIN) 5-325 MG tablet     10/18/18 1221           Pauline Aus, PA-C 10/19/18 2127    Bethann Berkshire, MD 10/20/18 [phone]

## 2019-02-26 ENCOUNTER — Telehealth: Payer: Self-pay | Admitting: *Deleted

## 2019-02-26 NOTE — Telephone Encounter (Signed)
Error

## 2019-06-16 ENCOUNTER — Other Ambulatory Visit: Payer: Self-pay

## 2019-06-16 ENCOUNTER — Emergency Department (HOSPITAL_COMMUNITY)
Admission: EM | Admit: 2019-06-16 | Discharge: 2019-06-16 | Disposition: A | Discharge status: Home / Self Care | Payer: Medicare Other | Attending: Emergency Medicine | Admitting: Emergency Medicine

## 2019-06-16 ENCOUNTER — Emergency Department (HOSPITAL_COMMUNITY): Payer: Medicare Other

## 2019-06-16 ENCOUNTER — Encounter (HOSPITAL_COMMUNITY): Payer: Self-pay | Admitting: Emergency Medicine

## 2019-06-16 DIAGNOSIS — Y92000 Kitchen of unspecified non-institutional (private) residence as  the place of occurrence of the external cause: Secondary | ICD-10-CM | POA: Diagnosis not present

## 2019-06-16 DIAGNOSIS — N183 Chronic kidney disease, stage 3 (moderate): Secondary | ICD-10-CM | POA: Diagnosis not present

## 2019-06-16 DIAGNOSIS — S99911A Unspecified injury of right ankle, initial encounter: Secondary | ICD-10-CM | POA: Diagnosis present

## 2019-06-16 DIAGNOSIS — Z7901 Long term (current) use of anticoagulants: Secondary | ICD-10-CM | POA: Insufficient documentation

## 2019-06-16 DIAGNOSIS — Z79899 Other long term (current) drug therapy: Secondary | ICD-10-CM | POA: Insufficient documentation

## 2019-06-16 DIAGNOSIS — I129 Hypertensive chronic kidney disease with stage 1 through stage 4 chronic kidney disease, or unspecified chronic kidney disease: Secondary | ICD-10-CM | POA: Diagnosis not present

## 2019-06-16 DIAGNOSIS — Y939 Activity, unspecified: Secondary | ICD-10-CM | POA: Insufficient documentation

## 2019-06-16 DIAGNOSIS — W2209XA Striking against other stationary object, initial encounter: Secondary | ICD-10-CM | POA: Diagnosis not present

## 2019-06-16 DIAGNOSIS — Z86718 Personal history of other venous thrombosis and embolism: Secondary | ICD-10-CM | POA: Diagnosis not present

## 2019-06-16 DIAGNOSIS — S93401A Sprain of unspecified ligament of right ankle, initial encounter: Secondary | ICD-10-CM | POA: Insufficient documentation

## 2019-06-16 DIAGNOSIS — Y999 Unspecified external cause status: Secondary | ICD-10-CM | POA: Diagnosis not present

## 2019-06-16 MED ORDER — HYDROCODONE-ACETAMINOPHEN 5-325 MG PO TABS: ORAL_TABLET | ORAL | 0 refills | Status: DC

## 2019-06-16 MED ORDER — OXYCODONE-ACETAMINOPHEN 5-325 MG PO TABS
1.0000 | ORAL_TABLET | Freq: Once | ORAL | Status: AC
  Administered 2019-06-16: 1 via ORAL
  Filled 2019-06-16: qty 1

## 2019-06-16 NOTE — Discharge Instructions (Addendum)
Elevate and apply ice packs on and off to your ankle.  Wear the brace and use the crutches as needed for support.  Follow-up with 1 of the orthopedic providers listed in a few days if your symptoms are not improving.

## 2019-06-16 NOTE — ED Provider Notes (Signed)
[facility] EMERGENCY DEPARTMENT Provider Note   CSN: [id number] Arrival date & time: 06/16/19  0935     History   Chief Complaint Chief Complaint  Patient presents with  . Ankle Pain    HPI Maceo ProJohn C Petrosino is a 48 y.o. male.     HPI   Maceo ProJohn C Spivak is a 48 y.o. male who presents to the Emergency Department complaining of pain to his right ankle and heel area that started early this morning.  He states that he got up to go to the restroom during the night and accidentally struck his foot on the refrigerator.  He complains of pain along the heel that radiates up to his ankle and lower leg.  Pain is worse with flexion of his foot and with weightbearing.  Improves at rest.  He denies numbness or tingling of his toes, swelling, or open wound.  He has not tried any therapies for pain relief prior to arrival.  He denies fall.    Past Medical History:  Diagnosis Date  . Bowel obstruction (HCC)   . Chronic abdominal pain   . Chronic back pain   . CKD (chronic kidney disease)   . DVT (deep venous thrombosis) (HCC)    lower extremity  . Hypertension   . Ileus (HCC) 01/03/2012  . Kidney stones   . Migraine headache   . Pain management   . Pancreatitis   . Pancytopenia (HCC)   . Retroperitoneal bleed 01/04/2012    Patient Active Problem List   Diagnosis Date Noted  . Intractable vomiting with nausea   . Bowel obstruction (HCC) 07/03/2017  . CKD (chronic kidney disease) stage 3, GFR 30-59 ml/min (HCC) 07/03/2017  . Dehydration 07/03/2017  . AKI (acute kidney injury) (HCC) 08/15/2016  . CKD (chronic kidney disease), stage III (HCC) 08/15/2016  . History of DVT (deep vein thrombosis) 08/15/2016  . Thrombocytopenia (HCC) 08/15/2016  . Generalized abdominal pain   . Small bowel obstruction (HCC)   . Pneumatosis intestinalis   . Hyperglycemia 01/05/2012  . Peripheral edema 01/04/2012  . Anemia 01/03/2012  . Metabolic acidosis 12/30/2011  . Hypocalcemia 12/30/2011  .  Hypertriglyceridemia 12/30/2011  . Chronic pancreatitis (HCC) 12/29/2011  . Hepatic steatosis 12/29/2011  . HTN (hypertension) 10/15/2011  . Alcohol abuse 10/15/2011  . Obesity 10/15/2011    Past Surgical History:  Procedure Laterality Date  . chronic pancreatitiis    . COLON RESECTION    . colon surgery    . COLOSTOMY REVERSAL    . HERNIA REPAIR    . IVC FILTER PLACEMENT ([facility] HX)    . LITHOTRIPSY    . Mucous Fistula     2013  . PANCREAS SURGERY     half of pancreas removed  . PANCREATITIC FISTULA    . SBO          Home Medications    Prior to Admission medications   Medication Sig Start Date End Date Taking? Authorizing Provider  amitriptyline (ELAVIL) 150 MG tablet Take 150 mg by mouth at bedtime.  11/30/14   [provider]  cholecalciferol (VITAMIN D3) 25 MCG (1000 UT) tablet Take 3,000 Units by mouth every morning.    [provider]  CREON 36000 UNITS CPEP capsule Take 36,000-108,000 Units by mouth 3 (three) times daily with meals. 1 with snacks and 3 with meals 12/11/14   [provider]  docusate sodium (COLACE) 100 MG capsule Take 100 mg by mouth 2 (two) times daily. For  constipation 12/19/14   [provider]  ferrous sulfate 325 (65 FE) MG tablet Take 325 mg by mouth daily.    [provider]  folic acid (FOLVITE) 1 MG tablet Take 1 mg by mouth every morning.  04/13/15   [provider]  HYDROcodone-acetaminophen (NORCO/VICODIN) 5-325 MG tablet Take one tab po q 4 hrs prn pain 10/18/18   Coren Sagan, PA-C  magnesium oxide (MAG-OX) 400 (241.3 MG) MG tablet Take 1 tablet by mouth 2 (two) times daily. 01/13/15   [provider]  Multiple Vitamin (MULTIVITAMIN WITH MINERALS) TABS tablet Take 1 tablet by mouth every morning.     [provider]  polyethylene glycol (MIRALAX / GLYCOLAX) packet Take 17 g by mouth daily as needed for mild constipation. 02/10/17   Kathie Dike, MD  rivaroxaban (XARELTO)  20 MG TABS tablet Take 20 mg by mouth every morning.  10/30/16   [provider]  topiramate (TOPAMAX) 50 MG tablet Take 50-100 mg by mouth 2 (two) times daily. 1 tablet in the Am and 2 tablets at night    [provider]  vitamin B-12 (CYANOCOBALAMIN) 1000 MCG tablet Take 1,000 mcg by mouth every Monday, Wednesday, and Friday.     [provider]  diphenhydrAMINE (SOMINEX) 25 MG tablet Take 50 mg by mouth as needed. For allergic reaction  12/29/11  [provider]  nitroGLYCERIN (NITROSTAT) 0.4 MG SL tablet Place 1 tablet (0.4 mg total) under the tongue every 5 (five) minutes as needed for chest pain. 10/16/11 12/29/11  Lanier Ensign, MD  omeprazole (PRILOSEC OTC) 20 MG tablet Take 1 tablet (20 mg total) by mouth daily. 10/16/11 12/29/11  Lanier Ensign, MD    Family History Family History  Problem Relation Age of Onset  . Colon cancer Neg Hx     Social History Social History   Tobacco Use  . Smoking status: Never Smoker  . Smokeless tobacco: Never Used  Substance Use Topics  . Alcohol use: Not Currently  . Drug use: No     Allergies   Metrizamide, Ace inhibitors, Coconut oil, Coconut flavor, Contrast media [iodinated diagnostic agents], and Iodine   Review of Systems Review of Systems  Constitutional: Negative for chills and fever.  Musculoskeletal: Positive for arthralgias (Right ankle and foot pain). Negative for joint swelling.  Skin: Negative for color change and wound.  Neurological: Negative for weakness and numbness.     Physical Exam Updated Vital Signs BP 130/90 (BP Location: Right Arm)   Pulse 100   Temp 98.1 F (36.7 C) (Oral)   Resp 18   Ht 5\' 10"  (1.778 m)   Wt 113.4 kg   SpO2 100%   BMI 35.87 kg/m   Physical Exam Vitals signs and nursing note reviewed.  Constitutional:      General: He is not in acute distress.    Appearance: Normal appearance. He is well-developed.  HENT:     Head: Atraumatic.   Cardiovascular:     Rate and Rhythm: Normal rate and regular rhythm.     Pulses: Normal pulses.  Pulmonary:     Effort: Pulmonary effort is normal.     Breath sounds: Normal breath sounds.  Musculoskeletal:        General: Tenderness and signs of injury present. No swelling or deformity.     Comments: Focal tenderness to palpation of the right hindfoot, lateral and medial ankle.  No edema or ecchymosis.  Dorsal foot and toes are nontender.  Achilles tendon appears intact.  Compartments are soft.  Skin:    General: Skin is warm and dry.     Capillary Refill: Capillary refill takes less than 2 seconds.     Findings: No rash.  Neurological:     General: No focal deficit present.     Mental Status: He is alert.     Sensory: No sensory deficit.     Motor: No weakness or abnormal muscle tone.      ED Treatments / Results  Labs (all labs ordered are listed, but only abnormal results are displayed) Labs Reviewed - No data to display  EKG None  Radiology Dg Ankle Complete Right  Result Date: 06/16/2019 CLINICAL DATA:  Acute right ankle pain which began this morning. No known injury. EXAM: RIGHT ANKLE - COMPLETE 3+ VIEW COMPARISON:  None. FINDINGS: There is no evidence of fracture, dislocation, or joint effusion. There is no evidence of arthropathy or other focal bone abnormality. Soft tissues are unremarkable. IMPRESSION: Negative. Electronically Signed   By: Francene Boyers M.D.   On: 06/16/2019 11:32    Procedures Procedures (including critical care time)  Medications Ordered in ED Medications  oxyCODONE-acetaminophen (PERCOCET/ROXICET) 5-325 MG per tablet 1 tablet (has no administration in time range)     Initial Impression / Assessment and Plan / ED Course  I have reviewed the triage vital signs and the nursing notes.  Pertinent labs & imaging results that were available during my care of the patient were reviewed by me and considered in my medical decision making (see chart  for details).        Patient with direct blow of the right foot.  X-ray is negative for fracture dislocation.  Remains neurovascularly intact.  No open wounds or skin changes.  No obvious edema.  Doubt Achilles tendon rupture.   likely sprain, patient agrees to treatment plan with elevation, ice, and close orthopedic follow-up.  Crutches and ASO applied, pain improved.  He agrees to treatment plan  Final Clinical Impressions(s) / ED Diagnoses   Final diagnoses:  Sprain of right ankle, unspecified ligament, initial encounter    ED Discharge Orders    None       Pauline Aus, PA-C 06/16/19 1226    Bethann Berkshire, MD 06/19/19 1051

## 2019-06-16 NOTE — ED Triage Notes (Signed)
Patient complains of right ankle pain that started this morning. Pt denies injury; states pain started when he woke up.

## 2019-10-21 ENCOUNTER — Emergency Department (HOSPITAL_COMMUNITY)
Admission: EM | Admit: 2019-10-21 | Discharge: 2019-10-21 | Disposition: A | Discharge status: Home / Self Care | Payer: Medicare HMO | Attending: Emergency Medicine | Admitting: Emergency Medicine

## 2019-10-21 ENCOUNTER — Encounter (HOSPITAL_COMMUNITY): Payer: Self-pay | Admitting: Emergency Medicine

## 2019-10-21 ENCOUNTER — Other Ambulatory Visit: Payer: Self-pay

## 2019-10-21 ENCOUNTER — Emergency Department (HOSPITAL_COMMUNITY): Payer: Medicare HMO

## 2019-10-21 DIAGNOSIS — N183 Chronic kidney disease, stage 3 unspecified: Secondary | ICD-10-CM | POA: Diagnosis not present

## 2019-10-21 DIAGNOSIS — I129 Hypertensive chronic kidney disease with stage 1 through stage 4 chronic kidney disease, or unspecified chronic kidney disease: Secondary | ICD-10-CM | POA: Insufficient documentation

## 2019-10-21 DIAGNOSIS — Z79899 Other long term (current) drug therapy: Secondary | ICD-10-CM | POA: Insufficient documentation

## 2019-10-21 DIAGNOSIS — R1013 Epigastric pain: Secondary | ICD-10-CM | POA: Diagnosis present

## 2019-10-21 LAB — CBC
HCT: 43.9 % (ref 39.0–52.0)
Hemoglobin: 13.7 g/dL (ref 13.0–17.0)
MCH: 26.7 pg (ref 26.0–34.0)
MCHC: 31.2 g/dL (ref 30.0–36.0)
MCV: 85.6 fL (ref 80.0–100.0)
Platelets: 145 10*3/uL — ABNORMAL LOW (ref 150–400)
RBC: 5.13 MIL/uL (ref 4.22–5.81)
RDW: 14.5 % (ref 11.5–15.5)
WBC: 4.1 10*3/uL (ref 4.0–10.5)
nRBC: 0 % (ref 0.0–0.2)

## 2019-10-21 LAB — BASIC METABOLIC PANEL
Anion gap: 10 (ref 5–15)
BUN: 22 mg/dL — ABNORMAL HIGH (ref 6–20)
CO2: 23 mmol/L (ref 22–32)
Calcium: 9.7 mg/dL (ref 8.9–10.3)
Chloride: 107 mmol/L (ref 98–111)
Creatinine, Ser: 1.78 mg/dL — ABNORMAL HIGH (ref 0.61–1.24)
GFR calc Af Amer: 50 mL/min — ABNORMAL LOW (ref >60)
GFR calc non Af Amer: 44 mL/min — ABNORMAL LOW (ref >60)
Glucose, Bld: 143 mg/dL — ABNORMAL HIGH (ref 70–99)
Potassium: 3.9 mmol/L (ref 3.5–5.1)
Sodium: 140 mmol/L (ref 135–145)

## 2019-10-21 LAB — LIPASE, BLOOD: Lipase: 30 U/L (ref 11–51)

## 2019-10-21 MED ORDER — OXYCODONE-ACETAMINOPHEN 5-325 MG PO TABS: 1.0000 | ORAL_TABLET | Freq: Four times a day (QID) | ORAL | 0 refills | Status: DC | PRN

## 2019-10-21 MED ORDER — HYDROMORPHONE HCL 1 MG/ML IJ SOLN
0.5000 mg | Freq: Once | INTRAMUSCULAR | Status: AC
  Administered 2019-10-21: 0.5 mg via INTRAVENOUS
  Filled 2019-10-21: qty 1

## 2019-10-21 MED ORDER — HYDROMORPHONE HCL 1 MG/ML IJ SOLN
1.0000 mg | Freq: Once | INTRAMUSCULAR | Status: AC
  Administered 2019-10-21: 22:00:00 1 mg via INTRAVENOUS
  Filled 2019-10-21: qty 1

## 2019-10-21 MED ORDER — PANTOPRAZOLE SODIUM 40 MG IV SOLR
40.0000 mg | INTRAVENOUS | Status: AC
  Administered 2019-10-21: 40 mg via INTRAVENOUS
  Filled 2019-10-21: qty 40

## 2019-10-21 MED ORDER — ONDANSETRON HCL 4 MG/2ML IJ SOLN
4.0000 mg | Freq: Once | INTRAMUSCULAR | Status: AC
  Administered 2019-10-21: 4 mg via INTRAVENOUS
  Filled 2019-10-21: qty 2

## 2019-10-21 MED ORDER — PANTOPRAZOLE SODIUM 40 MG PO TBEC: 40.0000 mg | DELAYED_RELEASE_TABLET | Freq: Every day | ORAL | 2 refills | Status: DC

## 2019-10-21 MED ORDER — SODIUM CHLORIDE 0.9 % IV BOLUS
1000.0000 mL | Freq: Once | INTRAVENOUS | Status: AC
  Administered 2019-10-21: 1000 mL via INTRAVENOUS

## 2019-10-21 NOTE — ED Triage Notes (Signed)
Patient complaining of abdominal pain that started today. Patient states that the pain became worse tonight. Patient denies any nausea or vomiting but states that pain is severe. Hx of pancreatitis.

## 2019-10-21 NOTE — Discharge Instructions (Signed)
Your testing today reveals essentially normal blood work.  Your kidneys appear similar to prior testing.  Your blood counts are normal and your CT scan did not show any acute findings.  I would recommend that you start taking the following medications  Firstly avoid anti-inflammatory medications with your history of abdominal surgery, this can make stomach ulcers and gastritis worse, instead you may use either Tylenol or the oxycodone which I have prescribed, take 1 tablet every 6 hours as needed only for severe pain.  If you are going to take this medication take a stool softener with it  Secondly take Protonix once a day to help with stomach acid and gastritis.  If you should develop severe or worsening symptoms please return to the emergency department immediately otherwise follow-up with your gastroenterologist within 1 week

## 2019-10-21 NOTE — ED Provider Notes (Signed)
[facility] EMERGENCY DEPARTMENT Provider Note   CSN: [id number] Arrival date & time: 10/21/19  2033     History Chief Complaint  Patient presents with  . Abdominal Pain    Peter [name] is a 49 y.o. male.  HPI   This patient is a 49 year old male, he has a history of recurrent pancreatitis, unfortunately has had DVTs and is on blood thinners, has a history of chronic kidney disease, chronic abdominal pain and unfortunately has had recurrent bowel obstructions as well. He has had multiple surgical procedures including bowel resection, pancreatic partial resection, ostomies with takedown's bilaterally. He has been in a very good state of health recently however over the last 24 hours he has developed some epigastric and left upper and right mid abdominal pain, this has been persistent, has worsened today and has become severe. He does not have any pain medicine at home though he has been on oxycodone in the past. Review of the medical record shows that the patient has a history of iron deficiency anemia, he is followed by hematology and oncology, has a history of being followed by [facility] gastroenterology Dr. Chestine Spore. At this time his pain is severe  Past Medical History:  Diagnosis Date  . Bowel obstruction (HCC)   . Chronic abdominal pain   . Chronic back pain   . CKD (chronic kidney disease)   . DVT (deep venous thrombosis) (HCC)    lower extremity  . Hypertension   . Ileus (HCC) 01/03/2012  . Kidney stones   . Migraine headache   . Pain management   . Pancreatitis   . Pancytopenia (HCC)   . Retroperitoneal bleed 01/04/2012    Patient Active Problem List   Diagnosis Date Noted  . Intractable vomiting with nausea   . Bowel obstruction (HCC) 07/03/2017  . CKD (chronic kidney disease) stage 3, GFR 30-59 ml/min 07/03/2017  . Dehydration 07/03/2017  . AKI (acute kidney injury) (HCC) 08/15/2016  . CKD (chronic kidney disease), stage III 08/15/2016  . History of  DVT (deep vein thrombosis) 08/15/2016  . Thrombocytopenia (HCC) 08/15/2016  . Generalized abdominal pain   . Small bowel obstruction (HCC)   . Pneumatosis intestinalis   . Hyperglycemia 01/05/2012  . Peripheral edema 01/04/2012  . Anemia 01/03/2012  . Metabolic acidosis 12/30/2011  . Hypocalcemia 12/30/2011  . Hypertriglyceridemia 12/30/2011  . Chronic pancreatitis (HCC) 12/29/2011  . Hepatic steatosis 12/29/2011  . HTN (hypertension) 10/15/2011  . Alcohol abuse 10/15/2011  . Obesity 10/15/2011    Past Surgical History:  Procedure Laterality Date  . chronic pancreatitiis    . COLON RESECTION    . colon surgery    . COLOSTOMY REVERSAL    . HERNIA REPAIR    . IVC FILTER PLACEMENT ([facility] HX)    . LITHOTRIPSY    . Mucous Fistula     2013  . PANCREAS SURGERY     half of pancreas removed  . PANCREATITIC FISTULA    . SBO         Family History  Problem Relation Age of Onset  . Colon cancer Neg Hx     Social History   Tobacco Use  . Smoking status: Never Smoker  . Smokeless tobacco: Never Used  Substance Use Topics  . Alcohol use: Not Currently  . Drug use: No    Home Medications Prior to Admission medications   Medication Sig Start Date End Date Taking? Authorizing Provider  amitriptyline (ELAVIL) 150 MG tablet Take  150 mg by mouth at bedtime.  11/30/14   [provider]  cholecalciferol (VITAMIN D3) 25 MCG (1000 UT) tablet Take 3,000 Units by mouth every morning.    [provider]  CREON 36000 UNITS CPEP capsule Take 36,000-108,000 Units by mouth 3 (three) times daily with meals. 1 with snacks and 3 with meals 12/11/14   [provider]  docusate sodium (COLACE) 100 MG capsule Take 100 mg by mouth 2 (two) times daily. For constipation 12/19/14   [provider]  ferrous sulfate 325 (65 FE) MG tablet Take 325 mg by mouth daily.    [provider]  folic acid (FOLVITE) 1 MG tablet Take 1 mg by mouth every morning.  04/13/15    [provider]  HYDROcodone-acetaminophen (NORCO/VICODIN) 5-325 MG tablet Take one tab po q 4 hrs prn pain 06/16/19   Triplett, Tammy, PA-C  magnesium oxide (MAG-OX) 400 (241.3 MG) MG tablet Take 1 tablet by mouth 2 (two) times daily. 01/13/15   [provider]  Multiple Vitamin (MULTIVITAMIN WITH MINERALS) TABS tablet Take 1 tablet by mouth every morning.     [provider]  oxyCODONE-acetaminophen (PERCOCET/ROXICET) 5-325 MG tablet Take 1 tablet by mouth every 6 (six) hours as needed for severe pain. 10/21/19   Eber Hong, MD  pantoprazole (PROTONIX) 40 MG tablet Take 1 tablet (40 mg total) by mouth daily. 10/21/19   Eber Hong, MD  polyethylene glycol [facility] Ethelene Hal) packet Take 17 g by mouth daily as needed for mild constipation. 02/10/17   Erick Blinks, MD  rivaroxaban (XARELTO) 20 MG TABS tablet Take 20 mg by mouth every morning.  10/30/16   [provider]  topiramate (TOPAMAX) 50 MG tablet Take 50-100 mg by mouth 2 (two) times daily. 1 tablet in the Am and 2 tablets at night    [provider]  vitamin B-12 (CYANOCOBALAMIN) 1000 MCG tablet Take 1,000 mcg by mouth every Monday, Wednesday, and Friday.     [provider]  diphenhydrAMINE (SOMINEX) 25 MG tablet Take 50 mg by mouth as needed. For allergic reaction  12/29/11  [provider]  nitroGLYCERIN (NITROSTAT) 0.4 MG SL tablet Place 1 tablet (0.4 mg total) under the tongue every 5 (five) minutes as needed for chest pain. 10/16/11 12/29/11  Hildred Laser, MD  omeprazole (PRILOSEC OTC) 20 MG tablet Take 1 tablet (20 mg total) by mouth daily. 10/16/11 12/29/11  Hildred Laser, MD    Allergies    Metrizamide, Ace inhibitors, Coconut oil, Coconut flavor, Contrast media [iodinated diagnostic agents], and Iodine  Review of Systems   Review of Systems  All other systems reviewed and are negative.   Physical Exam Updated Vital Signs BP 122/90 (BP Location: Left  Arm)   Pulse 94   Temp 98.6 F (37 C) (Oral)   Resp 18   Ht 1.778 m (5\' 10" )   Wt 108.9 kg   SpO2 95%   BMI 34.44 kg/m   Physical Exam Vitals and nursing note reviewed.  Constitutional:      General: He is not in acute distress.    Appearance: He is well-developed.     Comments: Uncomfortable appearing  HENT:     Head: Normocephalic and atraumatic.     Mouth/Throat:     Pharynx: No oropharyngeal exudate.  Eyes:     General: No scleral icterus.       Right eye: No discharge.        Left eye:  No discharge.     Conjunctiva/sclera: Conjunctivae normal.     Pupils: Pupils are equal, round, and reactive to light.  Neck:     Thyroid: No thyromegaly.     Vascular: No JVD.  Cardiovascular:     Rate and Rhythm: Regular rhythm. Tachycardia present.     Heart sounds: Normal heart sounds. No murmur. No friction rub. No gallop.   Pulmonary:     Effort: Pulmonary effort is normal. No respiratory distress.     Breath sounds: Normal breath sounds. No wheezing or rales.  Abdominal:     General: Bowel sounds are normal. There is distension.     Palpations: Abdomen is soft. There is no mass.     Tenderness: There is abdominal tenderness.     Comments: The patient has tabetic sounds to percussion across the upper and mid abdomen, there is mild tenderness to moderate tenderness in the upper abdomen, no lower abdominal tenderness  Musculoskeletal:        General: No tenderness. Normal range of motion.     Cervical back: Normal range of motion and neck supple.  Lymphadenopathy:     Cervical: No cervical adenopathy.  Skin:    General: Skin is warm and dry.     Findings: No erythema or rash.  Neurological:     Mental Status: He is alert.     Coordination: Coordination normal.  Psychiatric:        Behavior: Behavior normal.     ED Results / Procedures / Treatments   Labs (all labs ordered are listed, but only abnormal results are displayed) Labs Reviewed  CBC - Abnormal; Notable for  the following components:      Result Value   Platelets 145 (*)    All other components within normal limits  BASIC METABOLIC PANEL - Abnormal; Notable for the following components:   Glucose, Bld 143 (*)    BUN 22 (*)    Creatinine, Ser 1.78 (*)    GFR calc non Af Amer 44 (*)    GFR calc Af Amer 50 (*)    All other components within normal limits  LIPASE, BLOOD    EKG None  Radiology CT ABDOMEN PELVIS WO CONTRAST  Result Date: 10/21/2019 CLINICAL DATA:  49 year old male with acute abdominal pain today. History of pancreatitis. EXAM: CT ABDOMEN AND PELVIS WITHOUT CONTRAST TECHNIQUE: Multidetector CT imaging of the abdomen and pelvis was performed following the standard protocol without IV contrast. COMPARISON:  Noncontrast CT Abdomen and Pelvis 09/11/2018 and earlier. CT Abdomen and Pelvis with contrast 12/29/2011. FINDINGS: Lower chest: Lower lung volumes. No pericardial or pleural effusion. Mild lung base atelectasis. Additionally, there is chronic elevation of the left hemidiaphragm versus a chronic diaphragmatic hernia containing the spleen (series 2, image 20). This is stable since 2019 but was not present in 2013. Hepatobiliary: Hepatic steatosis with progression since 2019. Negative noncontrast liver and gallbladder otherwise. Pancreas: Chronic pancreatic atrophy and scarring inseparable from the splenic hilum and left lateral conal fascia. No peripancreatic inflammation. Spleen: Chronically involved in a left diaphragmatic hernia or elevated hemidiaphragm as stated above (series 2, image 21). Adrenals/Urinary Tract: Negative adrenal glands. Nonobstructed kidneys with no nephrolithiasis. Decompressed proximal ureters. Chronic retroperitoneal architectural distortion. Mildly distended urinary bladder with chronic bladder wall trabeculation. No acute perivesical stranding. Chronic pelvic phleboliths. Stomach/Bowel: Negative rectum. Redundant sigmoid colon with occasional diverticula. Mildly  gas distended mid sigmoid. Mild retained stool otherwise which continues into the left colon. In the left upper  quadrant there is what appears to be a small to large bowel anastomosis (coronal image 28). Evidence of a small bowel to small bowel anastomosis also in the left abdomen nearby on series 2, image 38. Previous ostomy site in the right abdomen on image 44. Chronic architectural distortion of the bowel mesentery, and some bowel loops might be chronically adhesed to the ventral abdominal wall. However, no dilated or inflamed loops are identified. Stable and negative stomach. No free air, free fluid. Vascular/Lymphatic: Vascular patency is not evaluated in the absence of IV contrast. Mild Aortoiliac calcified atherosclerosis. There are chronic left body wall venous collaterals. These may be related to chronic left iliac vein occlusion, uncertain. No lymphadenopathy identified. Reproductive: Negative. Other: No pelvic free fluid. Chronic postoperative changes to the ventral abdominal wall. Musculoskeletal: No acute osseous abnormality identified. IMPRESSION: 1. No acute or inflammatory process identified in the noncontrast abdomen or pelvis. 2. 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 3. Progressed hepatic steatosis since 2019. 4. 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 Electronically Signed   By: Genevie Ann M.D.   On: 10/21/2019 22:08    Procedures Procedures (including critical care time)  Medications Ordered in ED Medications  pantoprazole (PROTONIX) injection 40 mg (has no administration in time range)  HYDROmorphone (DILAUDID) injection 0.5 mg (has no administration in time range)  HYDROmorphone (DILAUDID) injection 1 mg (1 mg Intravenous Given 10/21/19 2142)   sodium chloride 0.9 % bolus 1,000 mL (1,000 mLs Intravenous New Bag/Given 10/21/19 2141)  ondansetron (ZOFRAN) injection 4 mg (4 mg Intravenous Given 10/21/19 2142)    ED Course  I have reviewed the triage vital signs and the nursing notes.  Pertinent labs & imaging results that were available during my care of the patient were reviewed by me and considered in my medical decision making (see chart for details).    MDM Rules/Calculators/A&P                      The patient has multiple surgical scars which are well-healed across the abdomen. Unfortunately he has multiple surgical procedures which is likely left him with multiple areas for scar tissue and adhesions. His exam and history could be related to recurrent pancreatitis but I would also consider recurrent bowel obstruction. At this time the patient will need imaging with a CT scan but has an IV contrast allergy so this will be done noncontrast. Labs of been ordered, pain medicine and IV fluids, n.p.o. status.  The patient is agreeable to the plan, his CT scan is unremarkable, labs are reassuring, creatinine seems to be baseline, no leukocytosis and normal lipase.  CT scan reviewed, the patient has been informed that there was no acute findings, no signs of obstruction  Final Clinical Impression(s) / ED Diagnoses Final diagnoses:  Epigastric pain    Rx / DC Orders ED Discharge Orders         Ordered    oxyCODONE-acetaminophen (PERCOCET/ROXICET) 5-325 MG tablet  Every 6 hours PRN     10/21/19 2236    pantoprazole (PROTONIX) 40 MG tablet  Daily     10/21/19 2236           Noemi Chapel, MD 10/21/19 2238

## 2019-11-24 ENCOUNTER — Encounter (HOSPITAL_COMMUNITY): Payer: Self-pay

## 2019-11-24 ENCOUNTER — Emergency Department (HOSPITAL_COMMUNITY): Payer: Medicare HMO

## 2019-11-24 ENCOUNTER — Other Ambulatory Visit: Payer: Self-pay

## 2019-11-24 ENCOUNTER — Emergency Department (HOSPITAL_COMMUNITY)
Admission: EM | Admit: 2019-11-24 | Discharge: 2019-11-24 | Disposition: A | Discharge status: Home / Self Care | Payer: Medicare HMO | Attending: Emergency Medicine | Admitting: Emergency Medicine

## 2019-11-24 DIAGNOSIS — Z86718 Personal history of other venous thrombosis and embolism: Secondary | ICD-10-CM | POA: Insufficient documentation

## 2019-11-24 DIAGNOSIS — N183 Chronic kidney disease, stage 3 unspecified: Secondary | ICD-10-CM | POA: Insufficient documentation

## 2019-11-24 DIAGNOSIS — Z79899 Other long term (current) drug therapy: Secondary | ICD-10-CM | POA: Insufficient documentation

## 2019-11-24 DIAGNOSIS — I129 Hypertensive chronic kidney disease with stage 1 through stage 4 chronic kidney disease, or unspecified chronic kidney disease: Secondary | ICD-10-CM | POA: Diagnosis not present

## 2019-11-24 DIAGNOSIS — M25571 Pain in right ankle and joints of right foot: Secondary | ICD-10-CM | POA: Diagnosis not present

## 2019-11-24 DIAGNOSIS — M79604 Pain in right leg: Secondary | ICD-10-CM | POA: Diagnosis present

## 2019-11-24 DIAGNOSIS — Z7901 Long term (current) use of anticoagulants: Secondary | ICD-10-CM | POA: Insufficient documentation

## 2019-11-24 MED ORDER — ACETAMINOPHEN 325 MG PO TABS
650.0000 mg | ORAL_TABLET | Freq: Once | ORAL | Status: AC
  Administered 2019-11-24: 18:00:00 650 mg via ORAL
  Filled 2019-11-24: qty 2

## 2019-11-24 MED ORDER — PREDNISONE 50 MG PO TABS
60.0000 mg | ORAL_TABLET | Freq: Once | ORAL | Status: AC
  Administered 2019-11-24: 20:00:00 60 mg via ORAL
  Filled 2019-11-24: qty 1

## 2019-11-24 MED ORDER — PREDNISONE 10 MG PO TABS: 40.0000 mg | ORAL_TABLET | Freq: Every day | ORAL | 0 refills | Status: AC

## 2019-11-24 MED ORDER — HYDROCODONE-ACETAMINOPHEN 5-325 MG PO TABS
2.0000 | ORAL_TABLET | Freq: Once | ORAL | Status: AC
  Administered 2019-11-24: 2 via ORAL
  Filled 2019-11-24: qty 2

## 2019-11-24 MED ORDER — HYDROCODONE-ACETAMINOPHEN 5-325 MG PO TABS: 1.0000 | ORAL_TABLET | Freq: Four times a day (QID) | ORAL | 0 refills | Status: DC | PRN

## 2019-11-24 NOTE — ED Provider Notes (Signed)
[facility] EMERGENCY DEPARTMENT Provider Note   CSN: [id number] Arrival date & time: 11/24/19  1557     History Chief Complaint  Patient presents with  . Leg Pain    Peter [name] is a 49 y.o. male.  HPI      Peter [name] is a 49 y.o. male, with a history of DVT, chronic back pain, presenting to the ED with right ankle pain present upon waking yesterday morning.  His pain is aching, located in the posterior ankle, radiating superiorly into the calf, 8/10.  Pain is worse with palpation or ambulation. He states he experienced similar pain with his DVT, for which patient was treated with Xarelto.  Voices compliance with this medication.  Denies fever/chills, no known trauma, swelling, color change, numbness, weakness, back pain, other leg pain, abdominal pain, chest pain, shortness of breath, cough, or any other complaints.     Past Medical History:  Diagnosis Date  . Bowel obstruction (HCC)   . Chronic abdominal pain   . Chronic back pain   . CKD (chronic kidney disease)   . DVT (deep venous thrombosis) (HCC)    lower extremity  . Hypertension   . Ileus (HCC) 01/03/2012  . Kidney stones   . Migraine headache   . Pain management   . Pancreatitis   . Pancytopenia (HCC)   . Retroperitoneal bleed 01/04/2012    Patient Active Problem List   Diagnosis Date Noted  . Intractable vomiting with nausea   . Bowel obstruction (HCC) 07/03/2017  . CKD (chronic kidney disease) stage 3, GFR 30-59 ml/min 07/03/2017  . Dehydration 07/03/2017  . AKI (acute kidney injury) (HCC) 08/15/2016  . CKD (chronic kidney disease), stage III 08/15/2016  . History of DVT (deep vein thrombosis) 08/15/2016  . Thrombocytopenia (HCC) 08/15/2016  . Generalized abdominal pain   . Small bowel obstruction (HCC)   . Pneumatosis intestinalis   . Hyperglycemia 01/05/2012  . Peripheral edema 01/04/2012  . Anemia 01/03/2012  . Metabolic acidosis 12/30/2011  . Hypocalcemia 12/30/2011  .  Hypertriglyceridemia 12/30/2011  . Chronic pancreatitis (HCC) 12/29/2011  . Hepatic steatosis 12/29/2011  . HTN (hypertension) 10/15/2011  . Alcohol abuse 10/15/2011  . Obesity 10/15/2011    Past Surgical History:  Procedure Laterality Date  . chronic pancreatitiis    . COLON RESECTION    . colon surgery    . COLOSTOMY REVERSAL    . HERNIA REPAIR    . IVC FILTER PLACEMENT ([facility] HX)    . LITHOTRIPSY    . Mucous Fistula     2013  . PANCREAS SURGERY     half of pancreas removed  . PANCREATITIC FISTULA    . SBO         Family History  Problem Relation Age of Onset  . Colon cancer Neg Hx     Social History   Tobacco Use  . Smoking status: Never Smoker  . Smokeless tobacco: Never Used  Substance Use Topics  . Alcohol use: Not Currently  . Drug use: No    Home Medications Prior to Admission medications   Medication Sig Start Date End Date Taking? Authorizing Provider  amitriptyline (ELAVIL) 150 MG tablet Take 150 mg by mouth at bedtime.  11/30/14   [provider]  cholecalciferol (VITAMIN D3) 25 MCG (1000 UT) tablet Take 3,000 Units by mouth every morning.    [provider]  CREON 36000 UNITS CPEP capsule Take 36,000-108,000 Units by mouth 3 (three) times daily  with meals. 1 with snacks and 3 with meals 12/11/14   [provider]  docusate sodium (COLACE) 100 MG capsule Take 100 mg by mouth 2 (two) times daily. For constipation 12/19/14   [provider]  ferrous sulfate 325 (65 FE) MG tablet Take 325 mg by mouth daily.    [provider]  folic acid (FOLVITE) 1 MG tablet Take 1 mg by mouth every morning.  04/13/15   [provider]  HYDROcodone-acetaminophen (NORCO/VICODIN) 5-325 MG tablet Take 1-2 tablets by mouth every 6 (six) hours as needed for severe pain. 11/24/19   Criss Pallone C, PA-C  magnesium oxide (MAG-OX) 400 (241.3 MG) MG tablet Take 1 tablet by mouth 2 (two) times daily. 01/13/15   [provider]    Multiple Vitamin (MULTIVITAMIN WITH MINERALS) TABS tablet Take 1 tablet by mouth every morning.     [provider]  oxyCODONE-acetaminophen (PERCOCET/ROXICET) 5-325 MG tablet Take 1 tablet by mouth every 6 (six) hours as needed for severe pain. 10/21/19   Noemi Chapel, MD  pantoprazole (PROTONIX) 40 MG tablet Take 1 tablet (40 mg total) by mouth daily. 10/21/19   Noemi Chapel, MD  polyethylene glycol [facility] / Floria Raveling) packet Take 17 g by mouth daily as needed for mild constipation. 02/10/17   Kathie Dike, MD  predniSONE (DELTASONE) 10 MG tablet Take 4 tablets (40 mg total) by mouth daily for 4 days. 11/24/19 11/28/19  Tonja Jezewski, Helane Gunther, PA-C  rivaroxaban (XARELTO) 20 MG TABS tablet Take 20 mg by mouth every morning.  10/30/16   [provider]  topiramate (TOPAMAX) 50 MG tablet Take 50-100 mg by mouth 2 (two) times daily. 1 tablet in the Am and 2 tablets at night    [provider]  vitamin B-12 (CYANOCOBALAMIN) 1000 MCG tablet Take 1,000 mcg by mouth every Monday, Wednesday, and Friday.     [provider]  diphenhydrAMINE (SOMINEX) 25 MG tablet Take 50 mg by mouth as needed. For allergic reaction  12/29/11  [provider]  nitroGLYCERIN (NITROSTAT) 0.4 MG SL tablet Place 1 tablet (0.4 mg total) under the tongue every 5 (five) minutes as needed for chest pain. 10/16/11 12/29/11  Lanier Ensign, MD  omeprazole (PRILOSEC OTC) 20 MG tablet Take 1 tablet (20 mg total) by mouth daily. 10/16/11 12/29/11  Lanier Ensign, MD    Allergies    Metrizamide, Ace inhibitors, Coconut oil, Coconut flavor, Contrast media [iodinated diagnostic agents], and Iodine  Review of Systems   Review of Systems  Constitutional: Negative for chills, diaphoresis and fever.  Respiratory: Negative for cough and shortness of breath.   Cardiovascular: Negative for chest pain.  Gastrointestinal: Negative for abdominal pain, nausea and vomiting.  Musculoskeletal: Positive for  arthralgias. Negative for joint swelling.  Skin: Negative for color change and wound.  Neurological: Negative for weakness and numbness.  All other systems reviewed and are negative.   Physical Exam Updated Vital Signs BP (!) 142/92 (BP Location: Left Wrist)   Pulse (!) 121   Temp 99.3 F (37.4 C) (Oral)   Resp 18   Ht 5\' 10"  (1.778 m)   Wt 117.9 kg   BMI 37.31 kg/m   Physical Exam Vitals and nursing note reviewed.  Constitutional:      General: He is not in acute distress.    Appearance: He is well-developed. He is not diaphoretic.  HENT:     Head: Normocephalic and atraumatic.     Mouth/Throat:  Mouth: Mucous membranes are moist.     Pharynx: Oropharynx is clear.  Eyes:     Conjunctiva/sclera: Conjunctivae normal.  Cardiovascular:     Rate and Rhythm: Regular rhythm. Tachycardia present.     Pulses: Normal pulses.          Radial pulses are 2+ on the right side and 2+ on the left side.       Dorsalis pedis pulses are 2+ on the right side and 2+ on the left side.       Posterior tibial pulses are 2+ on the right side and 2+ on the left side.     Heart sounds: Normal heart sounds.     Comments: Tactile temperature in the extremities appropriate and equal bilaterally.  Pulmonary:     Effort: Pulmonary effort is normal. No respiratory distress.     Breath sounds: Normal breath sounds.  Abdominal:     Palpations: Abdomen is soft.     Tenderness: There is no abdominal tenderness. There is no guarding.  Musculoskeletal:     Cervical back: Neck supple.     Right lower leg: No edema.     Left lower leg: No edema.     Comments: Tenderness to the right ankle, both malleoli, and posterior ankle along the Achilles tendon.  There is appropriate tension in the Achilles tendon.  Patient is able to plantar and dorsiflex the foot, though this is painful. No noted swelling, increased warmth, color change, deformity, or instability.  Lymphadenopathy:     Cervical: No cervical  adenopathy.  Skin:    General: Skin is warm and dry.  Neurological:     Mental Status: He is alert.     Comments: Sensation to light touch grossly intact in the bilateral feet and ankles. Strength intact against resistance with flexion and extension of the big toes bilaterally. Strength 5/5 with plantar and dorsiflexion at the ankles.  Psychiatric:        Mood and Affect: Mood and affect normal.        Speech: Speech normal.        Behavior: Behavior normal.     ED Results / Procedures / Treatments   Labs (all labs ordered are listed, but only abnormal results are displayed) Labs Reviewed - No data to display  EKG None  Radiology DG Ankle Complete Right  Result Date: 11/24/2019 CLINICAL DATA:  Ankle pain EXAM: RIGHT ANKLE - COMPLETE 3+ VIEW COMPARISON:  06/16/2019 FINDINGS: There is no evidence of fracture, dislocation, or joint effusion. There is no evidence of arthropathy or other focal bone abnormality. Soft tissues are unremarkable. IMPRESSION: Negative. Electronically Signed   By: Jasmine Pang M.D.   On: 11/24/2019 19:18   US Venous Img Lower Unilateral Right  Result Date: 11/24/2019 CLINICAL DATA:  Pain and swelling EXAM: RIGHT LOWER EXTREMITY VENOUS DOPPLER ULTRASOUND TECHNIQUE: Gray-scale sonography with graded compression, as well as color Doppler and duplex ultrasound were performed to evaluate the lower extremity deep venous systems from the level of the common femoral vein and including the common femoral, femoral, profunda femoral, popliteal and calf veins including the posterior tibial, peroneal and gastrocnemius veins when visible. The superficial great saphenous vein was also interrogated. Spectral Doppler was utilized to evaluate flow at rest and with distal augmentation maneuvers in the common femoral, femoral and popliteal veins. COMPARISON:  None. FINDINGS: Contralateral Common Femoral Vein: Respiratory phasicity is normal and symmetric with the symptomatic side. No  evidence of thrombus. Normal compressibility.  Common Femoral Vein: No evidence of thrombus. Normal compressibility, respiratory phasicity and response to augmentation. Saphenofemoral Junction: No evidence of thrombus. Normal compressibility and flow on color Doppler imaging. Profunda Femoral Vein: No evidence of thrombus. Normal compressibility and flow on color Doppler imaging. Femoral Vein: No evidence of thrombus. Normal compressibility, respiratory phasicity and response to augmentation. Popliteal Vein: No evidence of thrombus. Normal compressibility, respiratory phasicity and response to augmentation. Calf Veins: No evidence of thrombus. Normal compressibility and flow on color Doppler imaging. Superficial Great Saphenous Vein: No evidence of thrombus. Normal compressibility. Venous Reflux:  None. Other Findings:  None. IMPRESSION: No evidence of deep venous thrombosis. Electronically Signed   By: Katherine Mantle M.D.   On: 11/24/2019 18:29    Procedures Procedures (including critical care time)  Medications Ordered in ED Medications  HYDROcodone-acetaminophen (NORCO/VICODIN) 5-325 MG per tablet 2 tablet (has no administration in time range)  predniSONE (DELTASONE) tablet 60 mg (has no administration in time range)  acetaminophen (TYLENOL) tablet 650 mg (650 mg Oral Given 11/24/19 1828)    ED Course  I have reviewed the triage vital signs and the nursing notes.  Pertinent labs & imaging results that were available during my care of the patient were reviewed by me and considered in my medical decision making (see chart for details).    MDM Rules/Calculators/A&P                      Patient presents with atraumatic right ankle pain.  Patient is nontoxic appearing, afebrile, not tachypneic, not hypotensive.  Mildly tachycardic on my exam. No evidence of neurovascular compromise.  No swelling, color change, increased warmth on exam.  Septic arthritis was considered, but suspicion was low  due to lack of risk factors and exam is not suggestive. Patient mild tachycardia thought to be due to his discomfort.  Initially, stronger analgesia options were avoided since patient drove himself to the ED.  However, once patient stated he would have his wife drive him home, we were able to offer additional options. No acute abnormality on x-ray or ultrasound. The patient was given instructions for home care as well as return precautions. Patient voices understanding of these instructions, accepts the plan, and is comfortable with discharge.  Findings and plan of care discussed with Cathi Roan, MD.   Final Clinical Impression(s) / ED Diagnoses Final diagnoses:  Acute right ankle pain    Rx / DC Orders ED Discharge Orders         Ordered    HYDROcodone-acetaminophen (NORCO/VICODIN) 5-325 MG tablet  Every 6 hours PRN     11/24/19 1956    predniSONE (DELTASONE) 10 MG tablet  Daily    Note to Pharmacy: May dispense as 40mg  tablets, if equally economical for the patient or if patient preference.   11/24/19 1956           11/26/19, PA-C 11/24/19 2003    11/26/19, MD 11/25/19 2108

## 2019-11-24 NOTE — ED Triage Notes (Signed)
Pt reports right ankle /leg pain and swelling since yesterday. Pt has hx of DVT and is on xarelto.

## 2019-11-24 NOTE — Discharge Instructions (Signed)
You have been seen today for ankle pain. There were no acute abnormalities on the x-rays, including no sign of fracture or dislocation, however, there could be injuries to the soft tissues, such as the ligaments or tendons that are not seen on xrays.  There was no evidence of DVT on the ultrasound. Acetaminophen: May take acetaminophen (generic for Tylenol), as needed, for pain. Your daily total maximum amount of acetaminophen from all sources should be limited to 4000mg /day for persons without liver problems, or 2000mg /day for those with liver problems. Vicodin: May take Vicodin (hydrocodone-acetaminophen) as needed for severe pain.   Do not drive or perform other dangerous activities while taking this medication as it can cause drowsiness as well as changes in reaction time and judgement.   Please note that each pill of Vicodin contains 325 mg of acetaminophen (generic for Tylenol) and the above dosage limits apply. Prednisone: Take the prednisone, as prescribed, until finished. If you are a diabetic, please know prednisone can raise your blood sugar temporarily. Ice: May apply ice to the area over the next 24 hours for 15 minutes at a time to reduce swelling. Elevation: Keep the extremity elevated as often as possible to reduce pain and inflammation. Follow up: If symptoms are improving, you may follow up with your primary care provider for any continued management. If symptoms are not starting to improve within a week, you should follow up with the orthopedic specialist within two weeks. Return: Return to the ED for numbness, weakness, increasing pain, overall worsening symptoms, loss of function, or if symptoms are not improving, you have tried to follow up with the orthopedic specialist, and have been unable to do so.  For prescription assistance, may try using prescription discount sites or apps, such as goodrx.com

## 2020-01-04 ENCOUNTER — Emergency Department (HOSPITAL_COMMUNITY): Payer: Medicare HMO

## 2020-01-04 ENCOUNTER — Emergency Department (HOSPITAL_COMMUNITY)
Admission: EM | Admit: 2020-01-04 | Discharge: 2020-01-04 | Disposition: A | Discharge status: Home / Self Care | Payer: Medicare HMO | Attending: Emergency Medicine | Admitting: Emergency Medicine

## 2020-01-04 ENCOUNTER — Other Ambulatory Visit: Payer: Self-pay

## 2020-01-04 ENCOUNTER — Encounter (HOSPITAL_COMMUNITY): Payer: Self-pay

## 2020-01-04 DIAGNOSIS — R1032 Left lower quadrant pain: Secondary | ICD-10-CM | POA: Insufficient documentation

## 2020-01-04 DIAGNOSIS — R109 Unspecified abdominal pain: Secondary | ICD-10-CM | POA: Diagnosis not present

## 2020-01-04 DIAGNOSIS — Z7901 Long term (current) use of anticoagulants: Secondary | ICD-10-CM | POA: Diagnosis not present

## 2020-01-04 DIAGNOSIS — I129 Hypertensive chronic kidney disease with stage 1 through stage 4 chronic kidney disease, or unspecified chronic kidney disease: Secondary | ICD-10-CM | POA: Insufficient documentation

## 2020-01-04 DIAGNOSIS — Z79899 Other long term (current) drug therapy: Secondary | ICD-10-CM | POA: Insufficient documentation

## 2020-01-04 DIAGNOSIS — G8929 Other chronic pain: Secondary | ICD-10-CM | POA: Insufficient documentation

## 2020-01-04 DIAGNOSIS — R739 Hyperglycemia, unspecified: Secondary | ICD-10-CM | POA: Diagnosis not present

## 2020-01-04 DIAGNOSIS — R1031 Right lower quadrant pain: Secondary | ICD-10-CM | POA: Diagnosis present

## 2020-01-04 DIAGNOSIS — N183 Chronic kidney disease, stage 3 unspecified: Secondary | ICD-10-CM | POA: Diagnosis not present

## 2020-01-04 LAB — COMPREHENSIVE METABOLIC PANEL
ALT: 44 U/L (ref 0–44)
AST: 31 U/L (ref 15–41)
Albumin: 4.5 g/dL (ref 3.5–5.0)
Alkaline Phosphatase: 106 U/L (ref 38–126)
Anion gap: 9 (ref 5–15)
BUN: 22 mg/dL — ABNORMAL HIGH (ref 6–20)
CO2: 26 mmol/L (ref 22–32)
Calcium: 9.3 mg/dL (ref 8.9–10.3)
Chloride: 94 mmol/L — ABNORMAL LOW (ref 98–111)
Creatinine, Ser: 1.88 mg/dL — ABNORMAL HIGH (ref 0.61–1.24)
GFR calc Af Amer: 47 mL/min — ABNORMAL LOW (ref >60)
GFR calc non Af Amer: 40 mL/min — ABNORMAL LOW (ref >60)
Glucose, Bld: 454 mg/dL — ABNORMAL HIGH (ref 70–99)
Potassium: 4.6 mmol/L (ref 3.5–5.1)
Sodium: 129 mmol/L — ABNORMAL LOW (ref 135–145)
Total Bilirubin: 0.8 mg/dL (ref 0.3–1.2)
Total Protein: 8.1 g/dL (ref 6.5–8.1)

## 2020-01-04 LAB — URINALYSIS, ROUTINE W REFLEX MICROSCOPIC
Bacteria, UA: NONE SEEN
Bilirubin Urine: NEGATIVE
Glucose, UA: >=500 mg/dL — AB
Hgb urine dipstick: NEGATIVE
Ketones, ur: NEGATIVE mg/dL
Leukocytes,Ua: NEGATIVE
Nitrite: NEGATIVE
Protein, ur: NEGATIVE mg/dL
Specific Gravity, Urine: 1.025 (ref 1.005–1.030)
pH: 6 (ref 5.0–8.0)

## 2020-01-04 LAB — LIPASE, BLOOD: Lipase: 32 U/L (ref 11–51)

## 2020-01-04 LAB — CBC
HCT: 42.7 % (ref 39.0–52.0)
Hemoglobin: 13.7 g/dL (ref 13.0–17.0)
MCH: 26.3 pg (ref 26.0–34.0)
MCHC: 32.1 g/dL (ref 30.0–36.0)
MCV: 82.1 fL (ref 80.0–100.0)
Platelets: 144 10*3/uL — ABNORMAL LOW (ref 150–400)
RBC: 5.2 MIL/uL (ref 4.22–5.81)
RDW: 13.7 % (ref 11.5–15.5)
WBC: 3.5 10*3/uL — ABNORMAL LOW (ref 4.0–10.5)
nRBC: 0 % (ref 0.0–0.2)

## 2020-01-04 MED ORDER — DICYCLOMINE HCL 20 MG PO TABS: 20.0000 mg | ORAL_TABLET | Freq: Two times a day (BID) | ORAL | 0 refills | Status: DC

## 2020-01-04 MED ORDER — SODIUM CHLORIDE 0.9 % IV BOLUS
1000.0000 mL | Freq: Once | INTRAVENOUS | Status: AC
  Administered 2020-01-04: 1000 mL via INTRAVENOUS

## 2020-01-04 MED ORDER — ONDANSETRON 4 MG PO TBDP: 4.0000 mg | ORAL_TABLET | Freq: Three times a day (TID) | ORAL | 0 refills | Status: DC | PRN

## 2020-01-04 MED ORDER — FENTANYL CITRATE (PF) 100 MCG/2ML IJ SOLN
50.0000 ug | Freq: Once | INTRAMUSCULAR | Status: AC
  Administered 2020-01-04: 50 ug via INTRAVENOUS
  Filled 2020-01-04: qty 2

## 2020-01-04 NOTE — ED Provider Notes (Signed)
[facility] EMERGENCY DEPARTMENT Provider Note   CSN: [id number] Arrival date & time: 01/04/20  1502     History Chief Complaint  Patient presents with  . Abdominal Pain    Peter [name] is a 49 y.o. male.  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  He has intermittent abdominal pain but over the last 3 days has had a persistent abdominal pain in the lower right and left lower quadrants which is gradually worsened and is now become severe.  He is nauseated but has not been vomiting, endorses having intermittent bright red blood per rectum over time, states his stools have been very hard.  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 Bowel obstruction (HCC)   . Chronic abdominal pain   . Chronic back pain   . CKD (chronic kidney disease)   . DVT (deep venous thrombosis) (HCC)    lower extremity  . Hypertension   . Ileus (HCC) 01/03/2012  . Kidney stones   . Migraine headache   . Pain management   . Pancreatitis   . Pancytopenia (HCC)   . Retroperitoneal bleed 01/04/2012    Patient Active Problem List   Diagnosis Date Noted  . Intractable vomiting with nausea   . Bowel obstruction (HCC) 07/03/2017  . CKD (chronic kidney disease) stage 3, GFR 30-59 ml/min 07/03/2017  . Dehydration 07/03/2017  . AKI (acute kidney injury) (HCC) 08/15/2016  . CKD (chronic kidney disease), stage III 08/15/2016  . History of DVT (deep vein thrombosis) 08/15/2016  . Thrombocytopenia (HCC) 08/15/2016  . Generalized abdominal  pain   . Small bowel obstruction (HCC)   . Pneumatosis intestinalis   . Hyperglycemia 01/05/2012  . Peripheral edema 01/04/2012  . Anemia 01/03/2012  . Metabolic acidosis 12/30/2011  . Hypocalcemia 12/30/2011  . Hypertriglyceridemia 12/30/2011  . Chronic pancreatitis (HCC) 12/29/2011  . Hepatic steatosis 12/29/2011  . HTN (hypertension) 10/15/2011  . Alcohol abuse 10/15/2011  . Obesity 10/15/2011    Past Surgical History:  Procedure Laterality Date  . chronic pancreatitiis    . COLON RESECTION    . colon surgery    . COLOSTOMY REVERSAL    . HERNIA REPAIR    . IVC FILTER PLACEMENT ([facility] HX)    . LITHOTRIPSY    . Mucous Fistula     2013  . PANCREAS SURGERY     half of pancreas removed  . PANCREATITIC FISTULA    . SBO         Family History  Problem Relation Age of Onset  . Colon cancer Neg Hx     Social History   Tobacco Use  . Smoking status: Never Smoker  . Smokeless tobacco: Never Used  Substance Use Topics  . Alcohol use: Not Currently  . Drug use: No    Home Medications Prior to Admission medications   Medication Sig Start Date End Date Taking? Authorizing Provider  amitriptyline (ELAVIL) 150 MG tablet Take 150 mg by  mouth at bedtime.  11/30/14   [provider]  cholecalciferol (VITAMIN D3) 25 MCG (1000 UT) tablet Take 3,000 Units by mouth every morning.    [provider]  CREON 36000 UNITS CPEP capsule Take 36,000-108,000 Units by mouth 3 (three) times daily with meals. 1 with snacks and 3 with meals 12/11/14   [provider]  dicyclomine (BENTYL) 20 MG tablet Take 1 tablet (20 mg total) by mouth 2 (two) times daily. 01/04/20   Noemi Chapel, MD  docusate sodium (COLACE) 100 MG capsule Take 100 mg by mouth 2 (two) times daily. For constipation 12/19/14   [provider]  ferrous sulfate 325 (65 FE) MG tablet Take 325 mg by mouth daily.    [provider]  folic acid (FOLVITE) 1 MG tablet Take 1 mg by mouth every  morning.  04/13/15   [provider]  HYDROcodone-acetaminophen (NORCO/VICODIN) 5-325 MG tablet Take 1-2 tablets by mouth every 6 (six) hours as needed for severe pain. 11/24/19   Joy, Shawn C, PA-C  magnesium oxide (MAG-OX) 400 (241.3 MG) MG tablet Take 1 tablet by mouth 2 (two) times daily. 01/13/15   [provider]  Multiple Vitamin (MULTIVITAMIN WITH MINERALS) TABS tablet Take 1 tablet by mouth every morning.     [provider]  ondansetron (ZOFRAN ODT) 4 MG disintegrating tablet Take 1 tablet (4 mg total) by mouth every 8 (eight) hours as needed for nausea. 01/04/20   Noemi Chapel, MD  oxyCODONE-acetaminophen (PERCOCET/ROXICET) 5-325 MG tablet Take 1 tablet by mouth every 6 (six) hours as needed for severe pain. 10/21/19   Noemi Chapel, MD  pantoprazole (PROTONIX) 40 MG tablet Take 1 tablet (40 mg total) by mouth daily. 10/21/19   Noemi Chapel, MD  polyethylene glycol [facility] / Floria Raveling) packet Take 17 g by mouth daily as needed for mild constipation. 02/10/17   Kathie Dike, MD  rivaroxaban (XARELTO) 20 MG TABS tablet Take 20 mg by mouth every morning.  10/30/16   [provider]  topiramate (TOPAMAX) 50 MG tablet Take 50-100 mg by mouth 2 (two) times daily. 1 tablet in the Am and 2 tablets at night    [provider]  vitamin B-12 (CYANOCOBALAMIN) 1000 MCG tablet Take 1,000 mcg by mouth every Monday, Wednesday, and Friday.     [provider]  diphenhydrAMINE (SOMINEX) 25 MG tablet Take 50 mg by mouth as needed. For allergic reaction  12/29/11  [provider]  nitroGLYCERIN (NITROSTAT) 0.4 MG SL tablet Place 1 tablet (0.4 mg total) under the tongue every 5 (five) minutes as needed for chest pain. 10/16/11 12/29/11  Lanier Ensign, MD  omeprazole (PRILOSEC OTC) 20 MG tablet Take 1 tablet (20 mg total) by mouth daily. 10/16/11 12/29/11  Lanier Ensign, MD    Allergies    Metrizamide, Ace inhibitors, Coconut oil, Coconut flavor,  Contrast media [iodinated diagnostic agents], and Iodine  Review of Systems   Review of Systems  All other systems reviewed and are negative.   Physical Exam Updated Vital Signs BP 132/87   Pulse 99   Temp 98.2 F (36.8 C) (Oral)   Resp 17   Ht 1.778 m (5\' 10" )   Wt 117.9 kg   SpO2 99%   BMI 37.31 kg/m   Physical Exam Vitals and nursing note reviewed.  Constitutional:      General: He is not in acute distress.    Appearance: He is well-developed.  HENT:  Head: Normocephalic and atraumatic.     Mouth/Throat:     Pharynx: No oropharyngeal exudate.  Eyes:     General: No scleral icterus.       Right eye: No discharge.        Left eye: No discharge.     Conjunctiva/sclera: Conjunctivae normal.     Pupils: Pupils are equal, round, and reactive to light.  Neck:     Thyroid: No thyromegaly.     Vascular: No JVD.  Cardiovascular:     Rate and Rhythm: Regular rhythm. Tachycardia present.     Heart sounds: Normal heart sounds. No murmur. No friction rub. No gallop.   Pulmonary:     Effort: Pulmonary effort is normal. No respiratory distress.     Breath sounds: Normal breath sounds. No wheezing or rales.  Abdominal:     General: Bowel sounds are normal. There is distension.     Palpations: Abdomen is soft. There is no mass.     Tenderness: There is abdominal tenderness.     Comments: Bowel sounds are slightly hypoactive to normal, he has mild distention no tympanic sounds to percussion and diffuse abdominal tenderness but mostly across the upper abdomen and the lower abdomen.  There is mild guarding but no peritoneal signs  Musculoskeletal:        General: No tenderness. Normal range of motion.     Cervical back: Normal range of motion and neck supple.  Lymphadenopathy:     Cervical: No cervical adenopathy.  Skin:    General: Skin is warm and dry.     Findings: No erythema or rash.  Neurological:     Mental Status: He is alert.     Coordination: Coordination normal.   Psychiatric:        Behavior: Behavior normal.     ED Results / Procedures / Treatments   Labs (all labs ordered are listed, but only abnormal results are displayed) Labs Reviewed  COMPREHENSIVE METABOLIC PANEL - Abnormal; Notable for the following components:      Result Value   Sodium 129 (*)    Chloride 94 (*)    Glucose, Bld 454 (*)    BUN 22 (*)    Creatinine, Ser 1.88 (*)    GFR calc non Af Amer 40 (*)    GFR calc Af Amer 47 (*)    All other components within normal limits  CBC - Abnormal; Notable for the following components:   WBC 3.5 (*)    Platelets 144 (*)    All other components within normal limits  URINALYSIS, ROUTINE W REFLEX MICROSCOPIC - Abnormal; Notable for the following components:   Color, Urine STRAW (*)    Glucose, UA >=500 (*)    All other components within normal limits  LIPASE, BLOOD    EKG None  Radiology CT ABDOMEN PELVIS WO CONTRAST  Result Date: 01/04/2020 CLINICAL DATA:  Lower abdominal pain for 3 days, history of partial pancreatectomy for pancreatitis EXAM: CT ABDOMEN AND PELVIS WITHOUT CONTRAST TECHNIQUE: Multidetector CT imaging of the abdomen and pelvis was performed following the standard protocol without IV contrast. COMPARISON:  10/21/2019 FINDINGS: Lower chest: No acute pleural or parenchymal lung disease. Chronic eventration left hemidiaphragm. Hepatobiliary: Stable fatty infiltration of the liver. The gallbladder is unremarkable. Pancreas: Stable postsurgical changes from partial pancreatectomy. No acute inflammatory changes. Spleen: Normal in size without focal abnormality. Adrenals/Urinary Tract: No urinary tract calculi or obstructive uropathy. Bladder is moderately distended without focal abnormality. The adrenals  are normal. Stomach/Bowel: No bowel obstruction or ileus. No bowel wall thickening or inflammatory changes. Vascular/Lymphatic: Stable venous collaterals are seen within the left upper quadrant and left abdominal wall.  Minimal atherosclerosis of the aorta. No enlarged abdominal or pelvic lymph nodes. Reproductive: Prostate is unremarkable. Other: Postsurgical changes are again seen within the anterior abdominal wall, stable. No free fluid or free gas. Musculoskeletal: No acute or destructive bony lesions. Reconstructed images demonstrate no additional findings. Evaluation limited by the lack of intravenous and oral contrast. IMPRESSION: 1. No urinary tract calculi or obstructive uropathy. 2. Stable postsurgical changes. 3. Stable fatty infiltration of the liver. Electronically Signed   By: Sharlet Salina M.D.   On: 01/04/2020 20:25    Procedures Procedures (including critical care time)  Medications Ordered in ED Medications  sodium chloride 0.9 % bolus 1,000 mL (has no administration in time range)  fentaNYL (SUBLIMAZE) injection 50 mcg (50 mcg Intravenous Given 01/04/20 1840)    ED Course  I have reviewed the triage vital signs and the nursing notes.  Pertinent labs & imaging results that were available during my care of the patient were reviewed by me and considered in my medical decision making (see chart for details).  Clinical Course as of Jan 04 2051  Mon Jan 04, 2020  1928 Creatinine is at baseline, WBC are slightly low, UA without acute findings - no signs of DKA   [BM]    Clinical Course User Index [BM] Eber Hong, MD   MDM Rules/Calculators/A&P                      Frustratingly this patient unfortunately needs another work-up for his abdominal pain, with his history he is high risk for recurrent obstruction and other ischemic problems of his bowel.  Additionally he is on blood thinners with worsening lower abdominal pain.  He will need another CT scan.  He is mildly tachycardic.  The patient is agreeable to the plan.  CT without acute findings  Non acute abdomen without leukocytosis -  Pt informed of results - able to void without difficulty No UTI, no SBO, no free air,  No  tachycardia by time of d/c Educated pt about his blood sugar,   Pt states he is not a diabetic, he has not had any food since around 1 PM - so with a CBG > 400 and with his history of pancreatic resection, I suspect there is some insulin problems / resistance or shortage / ie diabetic leanings.  I have informed the patient if this - he will get IVF prior to d/c and has f/u in [facility] with [facility] to have formal fasting CBG.  Final Clinical Impression(s) / ED Diagnoses Final diagnoses:  Chronic abdominal pain  Hyperglycemia    Rx / DC Orders ED Discharge Orders         Ordered    dicyclomine (BENTYL) 20 MG tablet  2 times daily     01/04/20 2043    ondansetron (ZOFRAN ODT) 4 MG disintegrating tablet  Every 8 hours PRN     01/04/20 2044           Eber Hong, MD 01/04/20 2052

## 2020-01-04 NOTE — ED Triage Notes (Signed)
Pt presents to ED with complaints of lower abdominal pain x 3 days. Pt denies N/V/D.

## 2020-01-04 NOTE — Discharge Instructions (Addendum)
See the follow up list below for local family doctors if you do not have one Your CT scan didn't show any pathological or surgical findings  Your blood work was reassuring - but did show that you have a very elevated blood sugar - this may mean that you have diabetes, but to know for sure you will need to have your doctor do "fasting blood work" - please call them in the morning to let them know about this - your blood sugar was 450 today.  Try Bentyl every 6 hours as needed for pain Zofran for nausea  ER for worsening symptoms,

## 2020-03-19 IMAGING — CT CT ABD-PELV W/O CM
2 of 4 series · 16 of 46 positions shown, 18 images · non-contrast
Comparison: CT of the abdomen and pelvis from 02/08/2017

CLINICAL DATA: Acute onset of mild diarrhea and nausea.

EXAM:
CT ABDOMEN AND PELVIS WITHOUT CONTRAST
TECHNIQUE: Multidetector CT imaging of the abdomen and pelvis was performed
following the standard protocol without IV contrast.

[Series 2: axial st · axial · 0.98mm/px · z∈[-310,+130]mm · 13 of 98 slices shown, 15 images]
[im 5/98  soft-tissue]
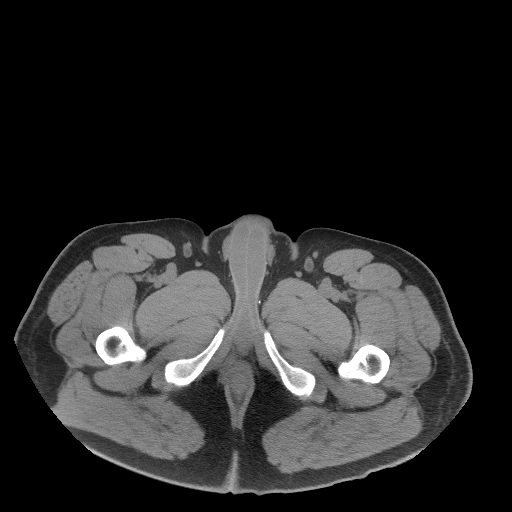
[im 5/98  bone]
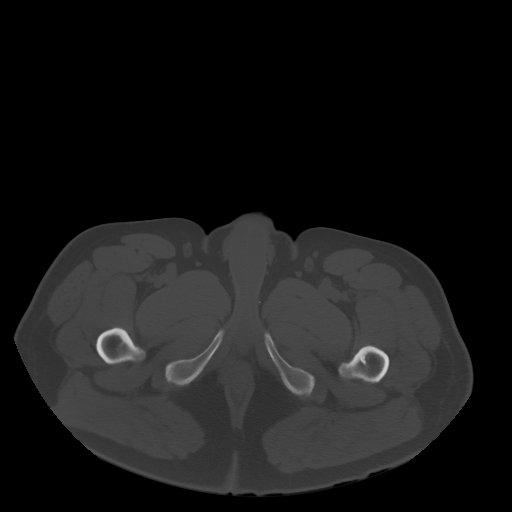
[im 14/98  soft-tissue]
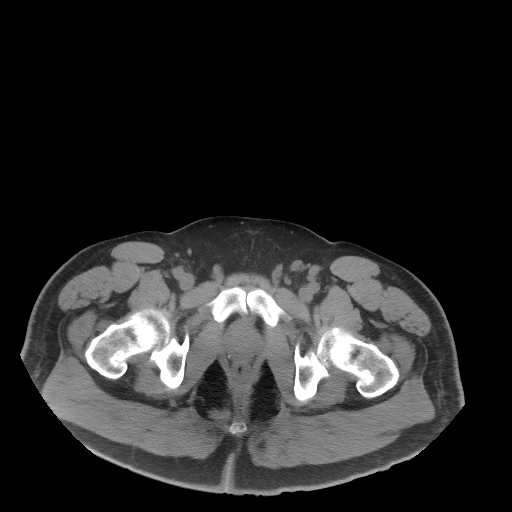
[im 19/98  soft-tissue]
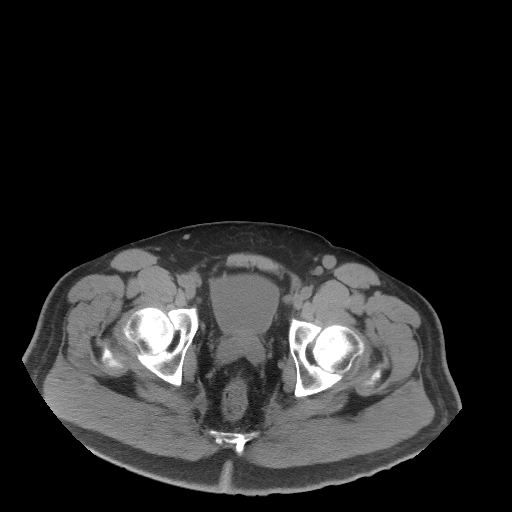
[im 28/98  soft-tissue]
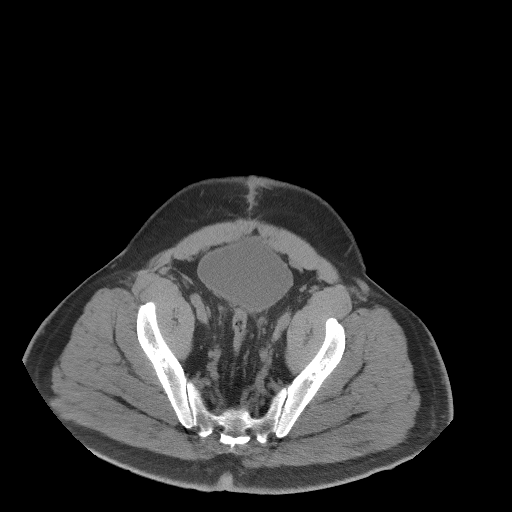
[im 33/98  soft-tissue]
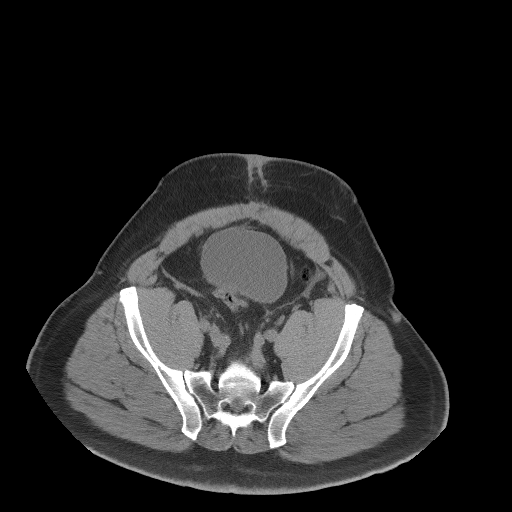
[im 42/98  soft-tissue]
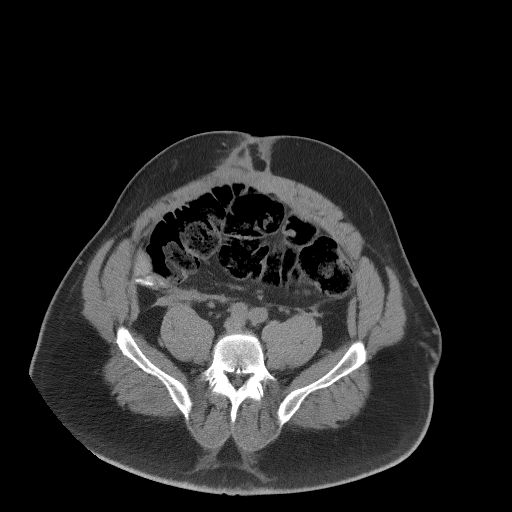
[im 51/98  soft-tissue]
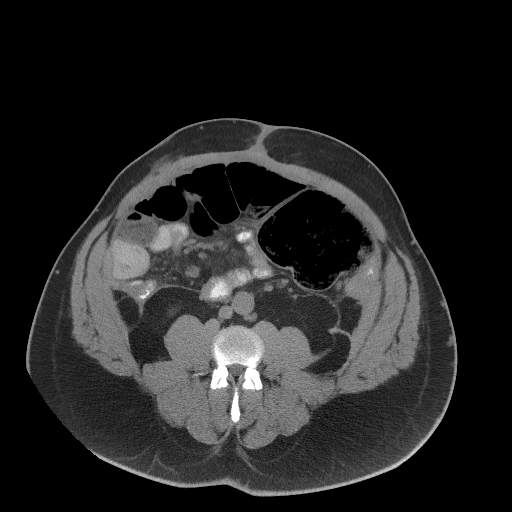
[im 56/98  soft-tissue]
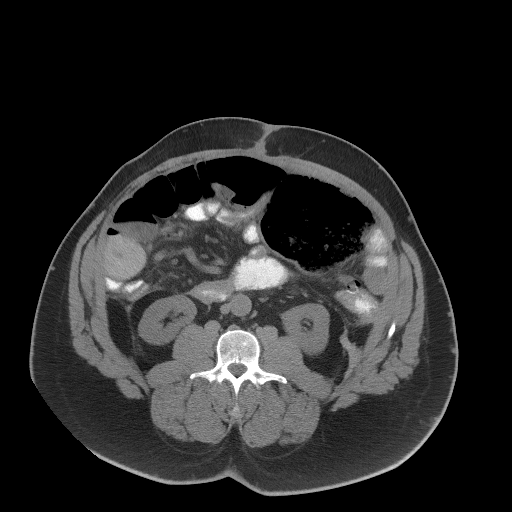
[im 65/98  soft-tissue]
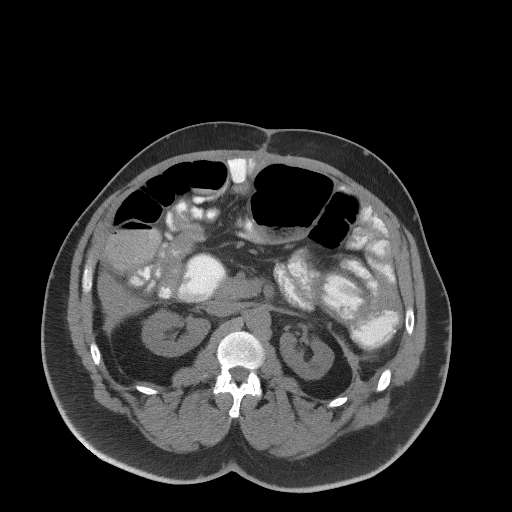
[im 65/98  bone]
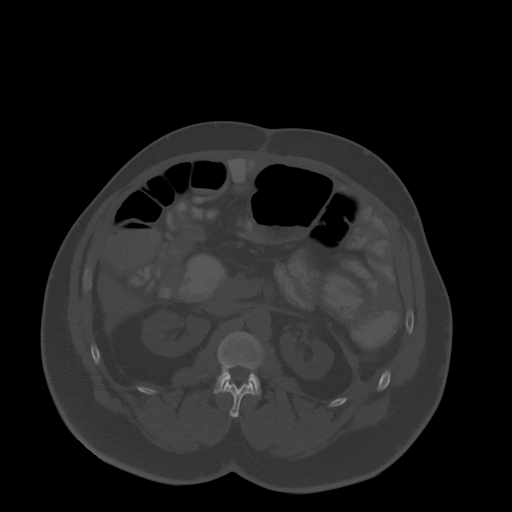
[im 70/98  soft-tissue]
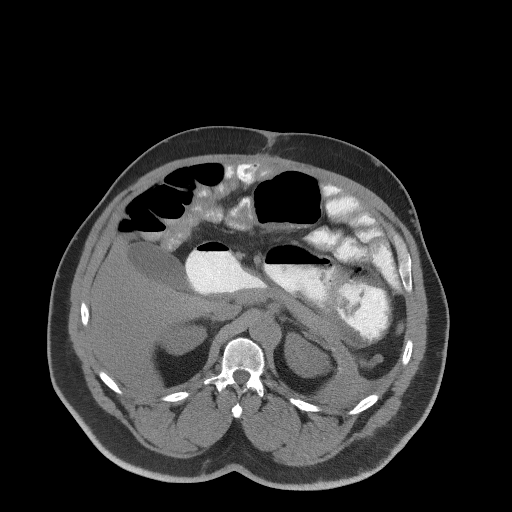
[im 79/98  soft-tissue]
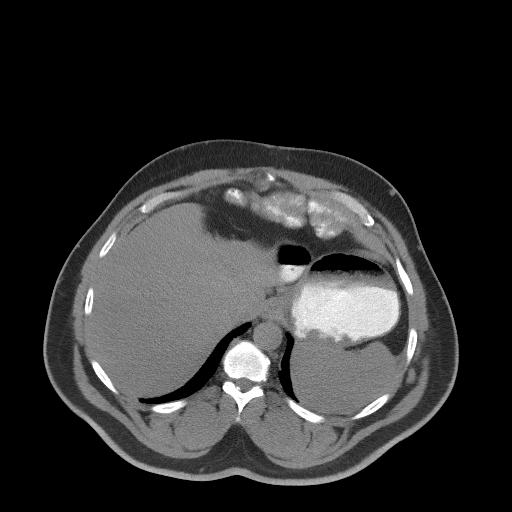
[im 84/98  soft-tissue]
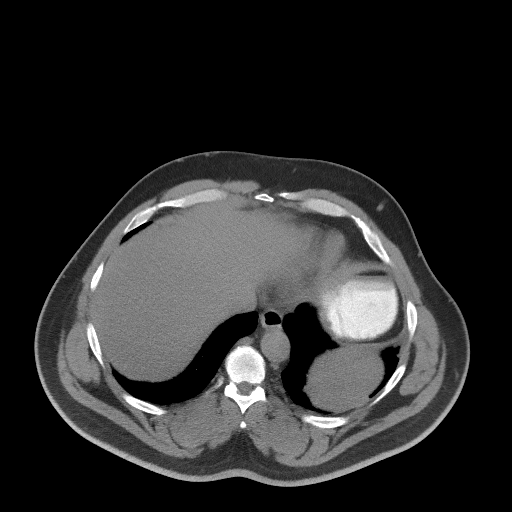
[im 93/98  soft-tissue]
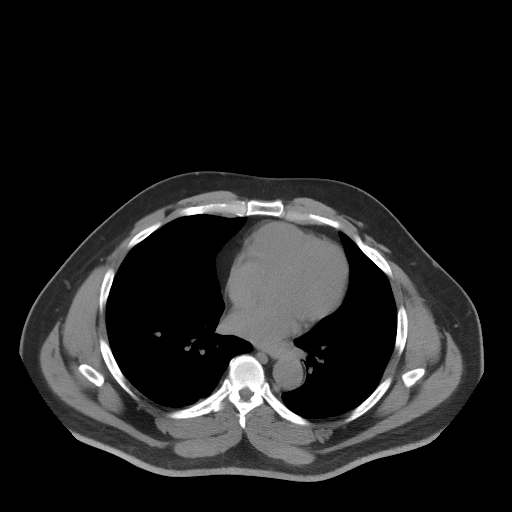

[Series 5: coronal st · coronal · 0.95mm/px · 3 of 112 slices shown]
[im 38/112  soft-tissue]
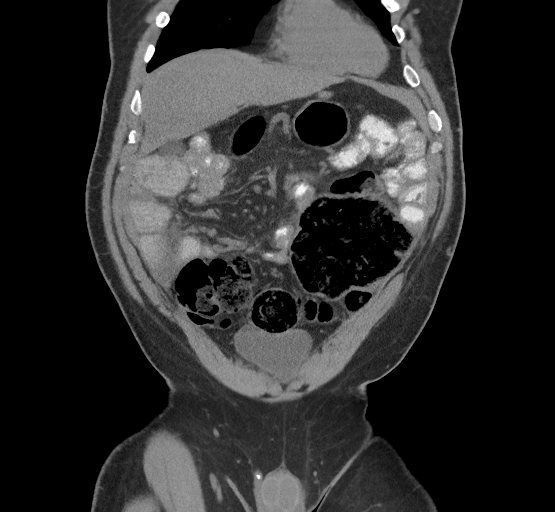
[im 50/112  soft-tissue]
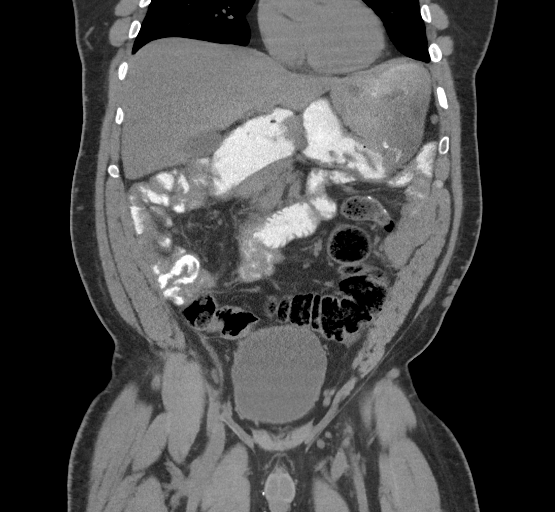
[im 62/112  soft-tissue]
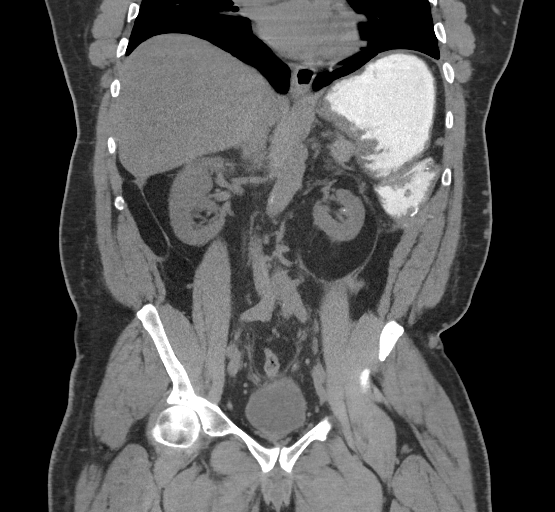

[16 of 46 positions shown; findings below may reference images not displayed]

FINDINGS: Lower chest: The visualized lung bases are grossly clear. The
visualized portions of the mediastinum are unremarkable.

Hepatobiliary: The liver is unremarkable in appearance. The
gallbladder is unremarkable in appearance. The common bile duct
remains normal in caliber.

Pancreas: Chronic scarring is noted tracking about the pancreatic
bed and Gerota's fascia on the left. Pancreatic tissue is not well
characterized.

Spleen: The spleen is unremarkable in appearance.

Adrenals/Urinary Tract: The adrenal glands are unremarkable in
appearance. The kidneys are within normal limits. There is no
evidence of hydronephrosis. No renal or ureteral stones are
identified. No perinephric stranding is seen.

Stomach/Bowel: An ileocolic anastomosis is grossly unremarkable in
appearance. The remaining colon is grossly unremarkable. The small
bowel is unremarkable in appearance. The stomach is partially filled
with contrast and is unremarkable in appearance.

Vascular/Lymphatic: The abdominal aorta is unremarkable in
appearance. The previously noted IVC filter has been removed. The
inferior vena cava is grossly unremarkable. No retroperitoneal
lymphadenopathy is seen. No pelvic sidewall lymphadenopathy is
identified.

Reproductive: The bladder is moderately distended and grossly
unremarkable. The prostate remains normal in size.

Other: Prominent postoperative scarring is noted along the paracolic
gutters bilaterally, and along the anterior abdominal wall.

Musculoskeletal: No acute osseous abnormalities are identified. The
visualized musculature is unremarkable in appearance.
IMPRESSION: 1. No acute abnormality seen to explain the patient's symptoms.
2. Chronic scarring noted tracking about the pancreatic bed and
Gerota's fascia on the left. Prominent postoperative scarring along
the paracolic gutters bilaterally, and along the anterior abdominal
wall.

## 2020-03-25 ENCOUNTER — Emergency Department (HOSPITAL_COMMUNITY)
Admission: EM | Admit: 2020-03-25 | Discharge: 2020-03-25 | Disposition: A | Discharge status: Home / Self Care | Payer: Medicare HMO | Attending: Emergency Medicine | Admitting: Emergency Medicine

## 2020-03-25 ENCOUNTER — Emergency Department (HOSPITAL_COMMUNITY): Payer: Medicare HMO

## 2020-03-25 ENCOUNTER — Encounter (HOSPITAL_COMMUNITY): Payer: Self-pay | Admitting: Emergency Medicine

## 2020-03-25 ENCOUNTER — Other Ambulatory Visit: Payer: Self-pay

## 2020-03-25 DIAGNOSIS — Z79899 Other long term (current) drug therapy: Secondary | ICD-10-CM | POA: Insufficient documentation

## 2020-03-25 DIAGNOSIS — N183 Chronic kidney disease, stage 3 unspecified: Secondary | ICD-10-CM | POA: Diagnosis not present

## 2020-03-25 DIAGNOSIS — Z888 Allergy status to other drugs, medicaments and biological substances status: Secondary | ICD-10-CM | POA: Insufficient documentation

## 2020-03-25 DIAGNOSIS — M25572 Pain in left ankle and joints of left foot: Secondary | ICD-10-CM | POA: Diagnosis not present

## 2020-03-25 DIAGNOSIS — I129 Hypertensive chronic kidney disease with stage 1 through stage 4 chronic kidney disease, or unspecified chronic kidney disease: Secondary | ICD-10-CM | POA: Diagnosis not present

## 2020-03-25 MED ORDER — COLCHICINE 0.6 MG PO TABS: 0.6000 mg | ORAL_TABLET | Freq: Every day | ORAL | 0 refills | Status: AC

## 2020-03-25 MED ORDER — IBUPROFEN 800 MG PO TABS
800.0000 mg | ORAL_TABLET | Freq: Once | ORAL | Status: AC
  Administered 2020-03-25: 800 mg via ORAL
  Filled 2020-03-25: qty 1

## 2020-03-25 NOTE — Discharge Instructions (Signed)
Your ankle pain may be due to gout.  Take medication prescribed.  Your x-ray did not show any bony injury.  Due to lack of injury, I have low suspicion for ankle sprain.  Follow-up with your doctor for further care.

## 2020-03-25 NOTE — ED Triage Notes (Signed)
Patient complains of left ankle pain but denies any know injury. Patient states that he woke up with his ankle throbbing.

## 2020-03-25 NOTE — ED Provider Notes (Signed)
[name] Provider Note   CSN: [id number] Arrival date & time: 03/25/20  2013     History Chief Complaint  Patient presents with  . Ankle Pain    left    Peter [name] is a 49 y.o. male.  The history is provided by the patient and medical records. No language interpreter was used.  Ankle Pain    49 year old male with history of chronic pain presenting for evaluation of ankle pain.  Patient report acute onset of left ankle pain that started earlier today.  Pain is sharp, throbbing, intense, worse with any movement or with ambulation.  No associated injury.  No numbness or weakness.  No fever or chills.  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  He has not missed any dosage.  He does not complain of any chest pain or shortness of breath.  He does not have any history of gout.  Past Medical History:  Diagnosis Date  . Bowel obstruction ([place])   . Chronic abdominal pain   . Chronic back pain   . CKD (chronic kidney disease)   . DVT (deep venous thrombosis) ([place])    lower extremity  . Hypertension   . Ileus ([place]) 01/03/2012  . Kidney stones   . Migraine headache   . Pain management   . Pancreatitis   . Pancytopenia ([place])   . Retroperitoneal bleed 01/04/2012    Patient Active Problem List   Diagnosis Date Noted  . Intractable vomiting with nausea   . Bowel obstruction ([place]) 07/03/2017  . CKD (chronic kidney disease) stage 3, GFR 30-59 ml/min 07/03/2017  . Dehydration 07/03/2017  . AKI (acute kidney injury) ([place]) 08/15/2016  . CKD (chronic kidney disease), stage III 08/15/2016  . History of DVT (deep vein thrombosis) 08/15/2016  . Thrombocytopenia ([place]) 08/15/2016  . Generalized abdominal pain   . Small bowel obstruction ([place])   . Pneumatosis intestinalis   . Hyperglycemia 01/05/2012  . Peripheral edema 01/04/2012  . Anemia 01/03/2012  . Metabolic acidosis 35/46/5681  . Hypocalcemia 12/30/2011   . Hypertriglyceridemia 12/30/2011  . Chronic pancreatitis ([place]) 12/29/2011  . Hepatic steatosis 12/29/2011  . HTN (hypertension) 10/15/2011  . Alcohol abuse 10/15/2011  . Obesity 10/15/2011    Past Surgical History:  Procedure Laterality Date  . chronic pancreatitiis    . COLON RESECTION    . colon surgery    . COLOSTOMY REVERSAL    . HERNIA REPAIR    . IVC FILTER PLACEMENT ([facility] HX)    . LITHOTRIPSY    . Mucous Fistula     2013  . PANCREAS SURGERY     half of pancreas removed  . PANCREATITIC FISTULA    . SBO         Family History  Problem Relation Age of Onset  . Colon cancer Neg Hx     Social History   Tobacco Use  . Smoking status: Never Smoker  . Smokeless tobacco: Never Used  Vaping Use  . Vaping Use: Never used  Substance Use Topics  . Alcohol use: Not Currently  . Drug use: No    Home Medications Prior to Admission medications   Medication Sig Start Date End Date Taking? Authorizing Provider  amitriptyline (ELAVIL) 150 MG tablet Take 150 mg by mouth at bedtime.  11/30/14   [provider]  cholecalciferol (VITAMIN D3) 25 MCG (1000 UT) tablet Take 3,000 Units by mouth every morning.  [provider]  CREON 36000 UNITS CPEP capsule Take 36,000-108,000 Units by mouth 3 (three) times daily with meals. 1 with snacks and 3 with meals 12/11/14   [provider]  dicyclomine (BENTYL) 20 MG tablet Take 1 tablet (20 mg total) by mouth 2 (two) times daily. 01/04/20   Eber Hong, MD  docusate sodium (COLACE) 100 MG capsule Take 100 mg by mouth 2 (two) times daily. For constipation 12/19/14   [provider]  ferrous sulfate 325 (65 FE) MG tablet Take 325 mg by mouth daily.    [provider]  folic acid (FOLVITE) 1 MG tablet Take 1 mg by mouth every morning.  04/13/15   [provider]  HYDROcodone-acetaminophen (NORCO/VICODIN) 5-325 MG tablet Take 1-2 tablets by mouth every 6 (six) hours as needed for severe  pain. 11/24/19   Joy, Shawn C, PA-C  magnesium oxide (MAG-OX) 400 (241.3 MG) MG tablet Take 1 tablet by mouth 2 (two) times daily. 01/13/15   [provider]  Multiple Vitamin (MULTIVITAMIN WITH MINERALS) TABS tablet Take 1 tablet by mouth every morning.     [provider]  ondansetron (ZOFRAN ODT) 4 MG disintegrating tablet Take 1 tablet (4 mg total) by mouth every 8 (eight) hours as needed for nausea. 01/04/20   Eber Hong, MD  oxyCODONE-acetaminophen (PERCOCET/ROXICET) 5-325 MG tablet Take 1 tablet by mouth every 6 (six) hours as needed for severe pain. 10/21/19   Eber Hong, MD  pantoprazole (PROTONIX) 40 MG tablet Take 1 tablet (40 mg total) by mouth daily. 10/21/19   Eber Hong, MD  polyethylene glycol [facility] / Ethelene Hal) packet Take 17 g by mouth daily as needed for mild constipation. 02/10/17   Erick Blinks, MD  rivaroxaban (XARELTO) 20 MG TABS tablet Take 20 mg by mouth every morning.  10/30/16   [provider]  topiramate (TOPAMAX) 50 MG tablet Take 50-100 mg by mouth 2 (two) times daily. 1 tablet in the Am and 2 tablets at night    [provider]  vitamin B-12 (CYANOCOBALAMIN) 1000 MCG tablet Take 1,000 mcg by mouth every Monday, Wednesday, and Friday.     [provider]  diphenhydrAMINE (SOMINEX) 25 MG tablet Take 50 mg by mouth as needed. For allergic reaction  12/29/11  [provider]  nitroGLYCERIN (NITROSTAT) 0.4 MG SL tablet Place 1 tablet (0.4 mg total) under the tongue every 5 (five) minutes as needed for chest pain. 10/16/11 12/29/11  Hildred Laser, MD  omeprazole (PRILOSEC OTC) 20 MG tablet Take 1 tablet (20 mg total) by mouth daily. 10/16/11 12/29/11  Hildred Laser, MD    Allergies    Metrizamide, Ace inhibitors, Coconut oil, Coconut flavor, Contrast media [iodinated diagnostic agents], and Iodine  Review of Systems   Review of Systems  All other systems reviewed and are negative.   Physical  Exam Updated Vital Signs BP (!) 124/95 (BP Location: Left Arm)   Pulse (!) 102   Temp 98.7 F (37.1 C) (Oral)   Resp 14   Ht 5\' 10"  (1.778 m)   Wt 114.3 kg   SpO2 97%   BMI 36.16 kg/m   Physical Exam Vitals and nursing note reviewed.  Constitutional:      General: He is not in acute distress.    Appearance: He is well-developed.  HENT:     Head: Atraumatic.  Eyes:     Conjunctiva/sclera: Conjunctivae normal.  Musculoskeletal:        General: Tenderness (Left  ankle: Exquisite tenderness to palpation of ankle throughout.  Tenderness to medial and lateral region with minimal warmth noted.  Decreased range of motion secondary to pain.  No pain at great toe.  Dorsalis pedis pulse palpable.  Brisk cap refill.  ) present.     Cervical back: Neck supple.  Skin:    Findings: No rash.  Neurological:     Mental Status: He is alert.     ED Results / Procedures / Treatments   Labs (all labs ordered are listed, but only abnormal results are displayed) Labs Reviewed - No data to display  EKG None  Radiology DG Ankle Complete Left  Result Date: 03/25/2020 CLINICAL DATA:  Atraumatic left ankle pain. EXAM: LEFT ANKLE COMPLETE - 3+ VIEW COMPARISON:  None. FINDINGS: There is no evidence of fracture, dislocation, or joint effusion. There is no evidence of arthropathy or other focal bone abnormality. There is mild diffuse soft tissue swelling. IMPRESSION: Mild diffuse soft tissue swelling without acute osseous abnormality. Electronically Signed   By: Aram Candela M.D.   On: 03/25/2020 21:38    Procedures Procedures (including critical care time)  Medications Ordered in ED Medications  ibuprofen (ADVIL) tablet 800 mg (800 mg Oral Given 03/25/20 2226)    ED Course  I have reviewed the triage vital signs and the nursing notes.  Pertinent labs & imaging results that were available during my care of the patient were reviewed by me and considered in my medical decision making (see  chart for details).    MDM Rules/Calculators/A&P                          BP (!) 124/95 (BP Location: Left Arm)   Pulse (!) 102   Temp 98.7 F (37.1 C) (Oral)   Resp 14   Ht 5\' 10"  (1.778 m)   Wt 114.3 kg   SpO2 97%   BMI 36.16 kg/m   Final Clinical Impression(s) / ED Diagnoses Final diagnoses:  Acute left ankle pain    Rx / DC Orders ED Discharge Orders         Ordered    colchicine 0.6 MG tablet  Daily     Discontinue  Reprint     03/25/20 2221         10:18 PM Patient with history of chronic pain here complaining of atraumatic left ankle pain that started earlier today.He does have exquisite tenderness to palpation of the ankle with minimal warmth but no erythema or signs of infection.  Left ankle x-ray shows mild diffuse soft tissue swelling without acute osseous abnormality.  I suspect this may be due to potential gout given the presentation.  I doubt ankle sprain or dislocation.  Will give 1 dose of ibuprofen here but will discharge home with colchicine.  Plan to stay away from opiate medication.  He does have history of DVT but has been compliant with his anticoagulant, Xarelto.  Doubt DVT.   2222, PA-C 03/25/20 2231    03/27/20, MD 03/27/20 1024

## 2020-04-07 ENCOUNTER — Telehealth: Payer: Self-pay | Admitting: *Deleted

## 2020-04-07 NOTE — Telephone Encounter (Signed)
Pharmacy called regarding refills.  RNCM informed pharmacy that pt Rx was prescribed/refilled by EDP who does not follow care as PCP.

## 2020-06-19 ENCOUNTER — Other Ambulatory Visit: Payer: Self-pay | Admitting: Family Medicine

## 2020-07-13 ENCOUNTER — Emergency Department (HOSPITAL_COMMUNITY)
Admission: EM | Admit: 2020-07-13 | Discharge: 2020-07-13 | Disposition: A | Discharge status: Home / Self Care | Payer: Medicare HMO | Attending: Emergency Medicine | Admitting: Emergency Medicine

## 2020-07-13 ENCOUNTER — Emergency Department (HOSPITAL_COMMUNITY): Payer: Medicare HMO

## 2020-07-13 ENCOUNTER — Encounter (HOSPITAL_COMMUNITY): Payer: Self-pay | Admitting: *Deleted

## 2020-07-13 ENCOUNTER — Other Ambulatory Visit: Payer: Self-pay

## 2020-07-13 DIAGNOSIS — I129 Hypertensive chronic kidney disease with stage 1 through stage 4 chronic kidney disease, or unspecified chronic kidney disease: Secondary | ICD-10-CM | POA: Diagnosis not present

## 2020-07-13 DIAGNOSIS — U071 COVID-19: Secondary | ICD-10-CM | POA: Insufficient documentation

## 2020-07-13 DIAGNOSIS — N183 Chronic kidney disease, stage 3 unspecified: Secondary | ICD-10-CM | POA: Insufficient documentation

## 2020-07-13 DIAGNOSIS — R509 Fever, unspecified: Secondary | ICD-10-CM | POA: Diagnosis present

## 2020-07-13 LAB — COMPREHENSIVE METABOLIC PANEL
ALT: 36 U/L (ref 0–44)
AST: 38 U/L (ref 15–41)
Albumin: 3.9 g/dL (ref 3.5–5.0)
Alkaline Phosphatase: 77 U/L (ref 38–126)
Anion gap: 11 (ref 5–15)
BUN: 11 mg/dL (ref 6–20)
CO2: 25 mmol/L (ref 22–32)
Calcium: 8.8 mg/dL — ABNORMAL LOW (ref 8.9–10.3)
Chloride: 102 mmol/L (ref 98–111)
Creatinine, Ser: 1.96 mg/dL — ABNORMAL HIGH (ref 0.61–1.24)
GFR calc non Af Amer: 38 mL/min — ABNORMAL LOW (ref >60)
Glucose, Bld: 89 mg/dL (ref 70–99)
Potassium: 4.2 mmol/L (ref 3.5–5.1)
Sodium: 138 mmol/L (ref 135–145)
Total Bilirubin: 0.6 mg/dL (ref 0.3–1.2)
Total Protein: 7.3 g/dL (ref 6.5–8.1)

## 2020-07-13 LAB — CBC WITH DIFFERENTIAL/PLATELET
Abs Immature Granulocytes: 0.01 10*3/uL (ref 0.00–0.07)
Basophils Absolute: 0 10*3/uL (ref 0.0–0.1)
Basophils Relative: 0 %
Eosinophils Absolute: 0.1 10*3/uL (ref 0.0–0.5)
Eosinophils Relative: 2 %
HCT: 38.7 % — ABNORMAL LOW (ref 39.0–52.0)
Hemoglobin: 12 g/dL — ABNORMAL LOW (ref 13.0–17.0)
Immature Granulocytes: 0 %
Lymphocytes Relative: 19 %
Lymphs Abs: 0.6 10*3/uL — ABNORMAL LOW (ref 0.7–4.0)
MCH: 25.4 pg — ABNORMAL LOW (ref 26.0–34.0)
MCHC: 31 g/dL (ref 30.0–36.0)
MCV: 82 fL (ref 80.0–100.0)
Monocytes Absolute: 0.5 10*3/uL (ref 0.1–1.0)
Monocytes Relative: 16 %
Neutro Abs: 1.9 10*3/uL (ref 1.7–7.7)
Neutrophils Relative %: 63 %
Platelets: 100 10*3/uL — ABNORMAL LOW (ref 150–400)
RBC: 4.72 MIL/uL (ref 4.22–5.81)
RDW: 16.9 % — ABNORMAL HIGH (ref 11.5–15.5)
WBC: 3 10*3/uL — ABNORMAL LOW (ref 4.0–10.5)
nRBC: 0 % (ref 0.0–0.2)

## 2020-07-13 LAB — RESPIRATORY PANEL BY RT PCR (FLU A&B, COVID)
Influenza A by PCR: NEGATIVE
Influenza B by PCR: NEGATIVE
SARS Coronavirus 2 by RT PCR: POSITIVE — AB

## 2020-07-13 MED ORDER — IBUPROFEN 800 MG PO TABS: 800.0000 mg | ORAL_TABLET | Freq: Once | ORAL | Status: DC

## 2020-07-13 MED ORDER — SODIUM CHLORIDE 0.9 % IV BOLUS
1000.0000 mL | Freq: Once | INTRAVENOUS | Status: AC
  Administered 2020-07-13: 1000 mL via INTRAVENOUS

## 2020-07-13 MED ORDER — ONDANSETRON HCL 4 MG/2ML IJ SOLN
4.0000 mg | Freq: Once | INTRAMUSCULAR | Status: AC
  Administered 2020-07-13: 4 mg via INTRAVENOUS
  Filled 2020-07-13: qty 2

## 2020-07-13 MED ORDER — ACETAMINOPHEN 500 MG PO TABS
500.0000 mg | ORAL_TABLET | Freq: Once | ORAL | Status: AC
  Administered 2020-07-13: 500 mg via ORAL
  Filled 2020-07-13: qty 1

## 2020-07-13 NOTE — Discharge Instructions (Signed)
Thank you for allowing [place] to care for you today.   Please return to the emergency department if you have any new or worsening symptoms.  You tested positive for covid-19 today.   Medications- You can take medications to help treat your symptoms: -Tylenol for fever and body aches. Please take as prescribed on the bottle. -Over the coutner cough medicine such as mucinex, robitussin, or other brands. -Flonase or saline nasal spray for nasal congestion -Vitamins as recommended by CDC  Treatment- This is a virus and unfortunately there are no antibitotics approved to treat this virus at this time. It is important to monitor your symptoms closely: -You should have a theremometer at home to check your temperature when feeling feverish. -Use a pulse ox meter to measure your oxygen when feeling short of breath.  -If your fever is over 100.4 despite taking tylenol or if your oxygen level drops below 94% these are reasons to rteurn to the emergency department for further evaluation. Please call the emergency department before you come to make [place] aware.    You need to quarantine for 10 days.  You can return to work when you are fever free on day 10 for 24 hours without any Tylenol.  Again: symptoms of shortness of breath, chest pain, difficulty breathing, new onset of confusion, any symptoms that are concerning. If any of these symptoms you should come to emergency department for evaluation.   I hope you feel better soon

## 2020-07-13 NOTE — ED Triage Notes (Signed)
Pt c/o cough, body aches, SOB that started Monday night, fever started today. Tylenol last taken at 1645 .

## 2020-07-13 NOTE — ED Notes (Signed)
Entered room and introduced self to patient at this time. Pt appears to be resting in bed, shivering but with no obvious signs of distress. Respirations are even and unlabored with equal chest rise and fall. Bed is locked in the lowest position, side rails x1 per request and call light within reach. Pt educated on call light and hourly rounding and is in agreement at this time.

## 2020-07-13 NOTE — ED Provider Notes (Signed)
[facility] EMERGENCY DEPARTMENT Provider Note   CSN: [id number] Arrival date & time: 07/13/20  1751     History Chief Complaint  Patient presents with  . Fever    Peter [name] is a 49 y.o. male with past medical history significant for CKD, hypertension, pancreatitis, pancytopenia.   HPI Patient presents to emergency department today with chief complaint of fever x1 day.  He reports T-max of 102 prior to arrival.  He took one Tylenol pill at 4:45 PM.  He is also endorsing nonproductive cough, body aches and shortness of breath, nausea without emesis.  No known Covid exposures.  He denies headache, neck pain, congestion, chest pain, abdominal pain, urinary symptoms, diarrhea, rash. He does not take NSAIDs because of his CKD.    Past Medical History:  Diagnosis Date  . Bowel obstruction ([facility])   . Chronic abdominal pain   . Chronic back pain   . CKD (chronic kidney disease)   . DVT (deep venous thrombosis) ([facility])    lower extremity  . Hypertension   . Ileus ([facility]) 01/03/2012  . Kidney stones   . Migraine headache   . Pain management   . Pancreatitis   . Pancytopenia ([facility])   . Retroperitoneal bleed 01/04/2012    Patient Active Problem List   Diagnosis Date Noted  . Intractable vomiting with nausea   . Bowel obstruction (H[facility]) 07/03/2017  . CKD (chronic kidney disease) stage 3, GFR 30-59 ml/min (H[facility]) 07/03/2017  . Dehydration 07/03/2017  . AKI (acute kidney injury) (H[facility]) 08/15/2016  . CKD (chronic kidney disease), stage III (H[facility]) 08/15/2016  . History of DVT (deep vein thrombosis) 08/15/2016  . Thrombocytopenia (H[facility]) 08/15/2016  . Generalized abdominal pain   . Small bowel obstruction (H[facility])   . Pneumatosis intestinalis   . Hyperglycemia 01/05/2012  . Peripheral edema 01/04/2012  . Anemia 01/03/2012  . Metabolic acidosis 12/30/2011  . Hypocalcemia 12/30/2011  . Hypertriglyceridemia 12/30/2011  . Chronic pancreatitis (H[facility]) 12/29/2011  . Hepatic steatosis 12/29/2011    . HTN (hypertension) 10/15/2011  . Alcohol abuse 10/15/2011  . Obesity 10/15/2011    Past Surgical History:  Procedure Laterality Date  . chronic pancreatitiis    . COLON RESECTION    . colon surgery    . COLOSTOMY REVERSAL    . HERNIA REPAIR    . IVC FILTER PLACEMENT ([facility] HX)    . LITHOTRIPSY    . Mucous Fistula     2013  . PANCREAS SURGERY     half of pancreas removed  . PANCREATITIC FISTULA    . SBO         Family History  Problem Relation Age of Onset  . Colon cancer Neg Hx     Social History   Tobacco Use  . Smoking status: Never Smoker  . Smokeless tobacco: Never Used  Vaping Use  . Vaping Use: Never used  Substance Use Topics  . Alcohol use: Not Currently  . Drug use: No    Home Medications Prior to Admission medications   Medication Sig Start Date End Date Taking? Authorizing Provider  amitriptyline (ELAVIL) 150 MG tablet Take 150 mg by mouth at bedtime.  11/30/14   [provider]  cholecalciferol (VITAMIN D3) 25 MCG (1000 UT) tablet Take 3,000 Units by mouth every morning.    [provider]  colchicine 0.6 MG tablet Take 1 tablet (0.6 mg total) by mouth daily. 03/25/20   Fayrene Helperran, [place], PA-C  CREON 36000 UNITS CPEP capsule  Take 36,000-108,000 Units by mouth 3 (three) times daily with meals. 1 with snacks and 3 with meals 12/11/14   [provider]  dicyclomine (BENTYL) 20 MG tablet Take 1 tablet (20 mg total) by mouth 2 (two) times daily. 01/04/20   Eber Hong, MD  docusate sodium (COLACE) 100 MG capsule Take 100 mg by mouth 2 (two) times daily. For constipation 12/19/14   [provider]  ferrous sulfate 325 (65 FE) MG tablet Take 325 mg by mouth daily.    [provider]  folic acid (FOLVITE) 1 MG tablet Take 1 mg by mouth every morning.  04/13/15   [provider]  HYDROcodone-acetaminophen (NORCO/VICODIN) 5-325 MG tablet Take 1-2 tablets by mouth every 6 (six) hours as needed for severe pain. 11/24/19    Joy, Shawn C, PA-C  magnesium oxide (MAG-OX) 400 (241.3 MG) MG tablet Take 1 tablet by mouth 2 (two) times daily. 01/13/15   [provider]  Multiple Vitamin (MULTIVITAMIN WITH MINERALS) TABS tablet Take 1 tablet by mouth every morning.     [provider]  ondansetron (ZOFRAN ODT) 4 MG disintegrating tablet Take 1 tablet (4 mg total) by mouth every 8 (eight) hours as needed for nausea. 01/04/20   Eber Hong, MD  oxyCODONE-acetaminophen (PERCOCET/ROXICET) 5-325 MG tablet Take 1 tablet by mouth every 6 (six) hours as needed for severe pain. 10/21/19   Eber Hong, MD  pantoprazole (PROTONIX) 40 MG tablet Take 1 tablet (40 mg total) by mouth daily. 10/21/19   Eber Hong, MD  polyethylene glycol [facility] / Ethe[facility] Hal) packet Take 17 g by mouth daily as needed for mild constipation. 02/10/17   Erick Blinks, MD  rivaroxaban (XARELTO) 20 MG TABS tablet Take 20 mg by mouth every morning.  10/30/16   [provider]  topiramate (TOPAMAX) 50 MG tablet Take 50-100 mg by mouth 2 (two) times daily. 1 tablet in the Am and 2 tablets at night    [provider]  vitamin B-12 (CYANOCOBALAMIN) 1000 MCG tablet Take 1,000 mcg by mouth every Monday, Wednesday, and Friday.     [provider]  diphenhydrAMINE (SOMINEX) 25 MG tablet Take 50 mg by mouth as needed. For allergic reaction  12/29/11  [provider]  nitroGLYCERIN (NITROSTAT) 0.4 MG SL tablet Place 1 tablet (0.4 mg total) under the tongue every 5 (five) minutes as needed for chest pain. 10/16/11 12/29/11  Hildred Laser, MD  omeprazole (PRILOSEC OTC) 20 MG tablet Take 1 tablet (20 mg total) by mouth daily. 10/16/11 12/29/11  Hildred Laser, MD    Allergies    Metrizamide, Ace inhibitors, Coconut oil, Coconut flavor, Contrast media [iodinated diagnostic agents], and Iodine  Review of Systems   Review of Systems All other systems are reviewed and are negative for acute change except as noted in the  HPI.  Physical Exam Updated Vital Signs BP 127/84 (BP Location: Right Arm)   Pulse (!) 126   Temp (!) 101.3 F (38.5 C) (Oral)   Resp 18   Ht  (1.778 m)   Wt 111.1 kg   SpO2 95%   BMI 35.15 kg/m   Physical Exam Vitals and nursing note reviewed.  Constitutional:      General: He is not in acute distress.    Appearance: He is not ill-appearing.  HENT:     Head: Normocephalic and atraumatic.     Right Ear: Tympanic membrane and external ear normal.     Left Ear: Tympanic  membrane and external ear normal.     Nose: Nose normal.     Mouth/Throat:     Mouth: Mucous membranes are moist.     Pharynx: Oropharynx is clear.  Eyes:     General: No scleral icterus.       Right eye: No discharge.        Left eye: No discharge.     Extraocular Movements: Extraocular movements intact.     Conjunctiva/sclera: Conjunctivae normal.     Pupils: Pupils are equal, round, and reactive to light.  Neck:     Vascular: No JVD.  Cardiovascular:     Rate and Rhythm: Regular rhythm. Tachycardia present.     Pulses: Normal pulses.          Radial pulses are 2+ on the right side and 2+ on the left side.     Heart sounds: Normal heart sounds.  Pulmonary:     Comments: Lungs clear to auscultation in all fields. Symmetric chest rise. No wheezing, rales, or rhonchi. Abdominal:     Comments: Abdomen is soft, non-distended, and non-tender in all quadrants. No rigidity, no guarding. No peritoneal signs.  Musculoskeletal:        General: Normal range of motion.     Cervical back: Normal range of motion.  Skin:    General: Skin is warm and dry.     Capillary Refill: Capillary refill takes less than 2 seconds.  Neurological:     Mental Status: He is oriented to person, place, and time.     GCS: GCS eye subscore is 4. GCS verbal subscore is 5. GCS motor subscore is 6.     Comments: Fluent speech, no facial droop.  Psychiatric:        Behavior: Behavior normal.     ED Results / Procedures /  Treatments   Labs (all labs ordered are listed, but only abnormal results are displayed) Labs Reviewed  RESPIRATORY PANEL BY RT PCR (FLU A&B, COVID) - Abnormal; Notable for the following components:      Result Value   SARS Coronavirus 2 by RT PCR POSITIVE (*)    All other components within normal limits  CBC WITH DIFFERENTIAL/PLATELET - Abnormal; Notable for the following components:   WBC 3.0 (*)    Hemoglobin 12.0 (*)    HCT 38.7 (*)    MCH 25.4 (*)    RDW 16.9 (*)    Platelets 100 (*)    Lymphs Abs 0.6 (*)    All other components within normal limits  COMPREHENSIVE METABOLIC PANEL - Abnormal; Notable for the following components:   Creatinine, Ser 1.96 (*)    Calcium 8.8 (*)    GFR calc non Af Amer 38 (*)    All other components within normal limits    EKG None  Radiology DG Chest Portable 1 View  Result Date: 07/13/2020 CLINICAL DATA:  Fever and shortness of breath EXAM: PORTABLE CHEST 1 VIEW COMPARISON:  None. FINDINGS: The heart size and mediastinal contours are within normal limits. Again noted is chronic subsegmental atelectasis or scarring at the left lung base. No large airspace consolidation or pleural effusion. The visualized skeletal structures are unremarkable. IMPRESSION: No active disease. Electronically Signed   By: Jonna Clark M.D.   On: 07/13/2020 19:29    Procedures Procedures (including critical care time)  Medications Ordered in ED Medications  sodium chloride 0.9 % bolus 1,000 mL (0 mLs Intravenous Stopped 07/13/20 2245)  ondansetron (ZOFRAN) injection 4 mg (  4 mg Intravenous Given 07/13/20 1952)  acetaminophen (TYLENOL) tablet 500 mg (500 mg Oral Given 07/13/20 1954)    ED Course  I have reviewed the triage vital signs and the nursing notes.  Pertinent labs & imaging results that were available during my care of the patient were reviewed by me and considered in my medical decision making (see chart for details).  Clinical Course as of Jul 13 2349    Wed Jul 13, 2020  2330 I checked patient's vital signs just prior to discharge.  His temp is 100.  Heart rate is 100 and oxygen saturation is 100% on room air.   [KA]    Clinical Course User Index [KA] [place], Caroleen Hamman, PA-C   MDM Rules/Calculators/A&P                          History provided by patient with additional history obtained from chart review.    49 year old male presenting with fever x1 day.  He was febrile to 101.3 and tachycardic to 126 in triage.  Normotensive, no hypoxia.  Patient looks not to feel well however is nontoxic appearing.  His lungs are clear to auscultation all fields.  He has normal work of breathing.  No abdominal tenderness. Patient tested positive for Covid. Basic labs were checked.  No significant electrolyte derangement.  Creatinine is 1.96, looks slightly up from his baseline.  Will give liter of IV fluids.  CBC shows hemoglobin of 12 and pancytopenia which is again consistent with his baseline. I viewed pt's chest xray and it does not suggest acute infectious processes  I ambulated patient and he did so without hypoxia.  Oxygen saturation stayed above 94%.  He was tachycardic to the 110s.  Patient p.o. challenge and is tolerating p.o. fluids without difficulty.  I engaged in shared decision-making with patient regarding his borderline tachycardia. He feels improved after IVF. He does not wish to have any further work-up while here in the emergency department.  He thinks he can manage his symptoms at home.  He is aware his next dose of Tylenol is due at midnight and plans to take it at home. Very strict return precautions discussed with patient.  Also recommend follow-up with PCP to have creatinine rechecked within 1 week.  Patient encouraged to increase fluid intake.  Also discussed symptomatic care with patient.  The patient appears reasonably screened and/or stabilized for discharge and I doubt any other medical condition or other [facility] requiring further  screening, evaluation, or treatment in the ED at this time prior to discharge. The patient is safe for discharge with strict return precautions discussed.   Peter [name] was evaluated in Emergency Department on 07/13/2020 for the symptoms described in the history of present illness. He was evaluated in the context of the global COVID-19 pandemic, which necessitated consideration that the patient might be at risk for infection with the SARS-CoV-2 virus that causes COVID-19. Institutional protocols and algorithms that pertain to the evaluation of patients at risk for COVID-19 are in a state of rapid change based on information released by regulatory bodies including the CDC and federal and state organizations. These policies and algorithms were followed during the patient's care in the ED.   Portions of this note were generated with Scientist, clinical (histocompatibility and immunogenetics). Dictation errors may occur despite best attempts at proofreading.    Final Clinical Impression(s) / ED Diagnoses Final diagnoses:  COVID    Rx / DC Orders  ED Discharge Orders    None       Kathyrn Lass 07/13/20 2350    Vanetta Mulders, MD 07/15/20 1538

## 2020-07-14 ENCOUNTER — Telehealth: Payer: Self-pay | Admitting: Infectious Diseases

## 2020-07-14 ENCOUNTER — Telehealth (HOSPITAL_COMMUNITY): Payer: Self-pay | Admitting: Nurse Practitioner

## 2020-07-14 NOTE — Telephone Encounter (Signed)
Called to Discuss with patient about Covid symptoms and the use of the monoclonal antibody infusion for those with mild to moderate Covid symptoms and at a high risk of hospitalization.     Pt appears to qualify for this infusion due to co-morbid conditions and/or a member of an at-risk group in accordance with the FDA Emergency Use Authorization.    Unable to reach pt - unable to leave a message. Text message sent.  He has multiple comorbidities for severe symptoms r/t covid. Early in symptoms as they just started 10/05 per chart review.    Rexene Alberts, MSN, NP-C [facility]  [email or web address] Pager: [phone] Office: [phone] [facility] Main Line: [phone]

## 2020-07-14 NOTE — Telephone Encounter (Signed)
Called to Discuss with patient about Covid symptoms and the use of the monoclonal antibody infusion for those with mild to moderate Covid symptoms and at a high risk of hospitalization.     Pt is qualified for this infusion due to co-morbid conditions and/or a member of an at-risk group.     Patient Active Problem List   Diagnosis Date Noted  . Intractable vomiting with nausea   . Bowel obstruction (HCC) 07/03/2017  . CKD (chronic kidney disease) stage 3, GFR 30-59 ml/min (HCC) 07/03/2017  . Dehydration 07/03/2017  . AKI (acute kidney injury) (HCC) 08/15/2016  . CKD (chronic kidney disease), stage III (HCC) 08/15/2016  . History of DVT (deep vein thrombosis) 08/15/2016  . Thrombocytopenia (HCC) 08/15/2016  . Generalized abdominal pain   . Small bowel obstruction (HCC)   . Pneumatosis intestinalis   . Hyperglycemia 01/05/2012  . Peripheral edema 01/04/2012  . Anemia 01/03/2012  . Metabolic acidosis 12/30/2011  . Hypocalcemia 12/30/2011  . Hypertriglyceridemia 12/30/2011  . Chronic pancreatitis (HCC) 12/29/2011  . Hepatic steatosis 12/29/2011  . HTN (hypertension) 10/15/2011  . Alcohol abuse 10/15/2011  . Obesity 10/15/2011    Patient declines infusion at this time. Symptoms tier reviewed as well as criteria for ending isolation. Preventative practices reviewed. Patient verbalized understanding.    Patient advised to call back if he/she decides that he/she does want to get infusion. Callback number to the infusion center given. Patient advised to go to Urgent care or ED with severe symptoms.

## 2021-09-14 ENCOUNTER — Emergency Department (HOSPITAL_COMMUNITY)
Admission: EM | Admit: 2021-09-14 | Discharge: 2021-09-14 | Disposition: A | Discharge status: Home / Self Care | Payer: Medicare HMO | Attending: Emergency Medicine | Admitting: Emergency Medicine

## 2021-09-14 ENCOUNTER — Other Ambulatory Visit: Payer: Self-pay

## 2021-09-14 ENCOUNTER — Encounter (HOSPITAL_COMMUNITY): Payer: Self-pay

## 2021-09-14 ENCOUNTER — Emergency Department (HOSPITAL_COMMUNITY): Payer: Medicare HMO

## 2021-09-14 DIAGNOSIS — Z28310 Unvaccinated for covid-19: Secondary | ICD-10-CM | POA: Diagnosis not present

## 2021-09-14 DIAGNOSIS — N183 Chronic kidney disease, stage 3 unspecified: Secondary | ICD-10-CM | POA: Insufficient documentation

## 2021-09-14 DIAGNOSIS — Z79899 Other long term (current) drug therapy: Secondary | ICD-10-CM | POA: Insufficient documentation

## 2021-09-14 DIAGNOSIS — Z8616 Personal history of COVID-19: Secondary | ICD-10-CM | POA: Diagnosis not present

## 2021-09-14 DIAGNOSIS — Z20822 Contact with and (suspected) exposure to covid-19: Secondary | ICD-10-CM | POA: Insufficient documentation

## 2021-09-14 DIAGNOSIS — J069 Acute upper respiratory infection, unspecified: Secondary | ICD-10-CM | POA: Insufficient documentation

## 2021-09-14 DIAGNOSIS — I129 Hypertensive chronic kidney disease with stage 1 through stage 4 chronic kidney disease, or unspecified chronic kidney disease: Secondary | ICD-10-CM | POA: Insufficient documentation

## 2021-09-14 DIAGNOSIS — Z7901 Long term (current) use of anticoagulants: Secondary | ICD-10-CM | POA: Diagnosis not present

## 2021-09-14 DIAGNOSIS — R059 Cough, unspecified: Secondary | ICD-10-CM | POA: Diagnosis present

## 2021-09-14 LAB — RESP PANEL BY RT-PCR (FLU A&B, COVID) ARPGX2
Influenza A by PCR: NEGATIVE
Influenza B by PCR: NEGATIVE
SARS Coronavirus 2 by RT PCR: NEGATIVE

## 2021-09-14 NOTE — Discharge Instructions (Signed)
Likely a viral infection, recommend over-the-counter pain medications like ibuprofen Tylenol for fever and pain control, nasal decongestions like Flonase and Zyrtec, Mucinex for cough.  If not eating recommend supplementing with Gatorade to help with electrolyte supplementation.  Follow-up PCP for further evaluation.  Come back to the emergency department if you develop chest pain, shortness of breath, severe abdominal pain, uncontrolled nausea, vomiting, diarrhea.  

## 2021-09-14 NOTE — ED Provider Notes (Signed)
[facility] EMERGENCY DEPARTMENT Provider Note   CSN: [id number] Arrival date & time: 09/14/21  0841     History Chief Complaint  Patient presents with   Cough    Peter [name] is a 50 y.o. male.  HPI  Patient with medical history including CKD, DVT currently on Eliquis, hypertension, pancreatitis presents with complaints of a cough.  Patient states cough started 1 week ago, states it is productive, he has associated fevers and chills, a slight headache, denies nasal congestion, sore throat that is since resolved, chest pain, shortness of breath, pleuritic chest pain, stomach pain, nausea, vomiting, diarrhea, admits to decrease in appetite, denies general body aches.  He is not vaccine against COVID-19 or influenza, he endorses that a family member was sick with a viral URI.  He has no other complaints this time, denies any alleviating or aggravating factors.  He is still tolerating p.o, has been compliant with all of his medications has not missed his Eliquis.  Past Medical History:  Diagnosis Date   Bowel obstruction (HCC)    Chronic abdominal pain    Chronic back pain    CKD (chronic kidney disease)    DVT (deep venous thrombosis) (HCC)    lower extremity   Hypertension    Ileus ([place]) 01/03/2012   Kidney stones    Migraine headache    Pain management    Pancreatitis    Pancytopenia ([place])    Retroperitoneal bleed 01/04/2012    Patient Active Problem List   Diagnosis Date Noted   Intractable vomiting with nausea    Bowel obstruction ([place]) 07/03/2017   CKD (chronic kidney disease) stage 3, GFR 30-59 ml/min ([place]) 07/03/2017   Dehydration 07/03/2017   AKI (acute kidney injury) ([place]) 08/15/2016   CKD (chronic kidney disease), stage III ([place]) 08/15/2016   History of DVT (deep vein thrombosis) 08/15/2016   Thrombocytopenia ([place]) 08/15/2016   Generalized abdominal pain    Small bowel obstruction ([place])    Pneumatosis intestinalis    Hyperglycemia 01/05/2012   Peripheral  edema 01/04/2012   Anemia [id number]   Metabolic acidosis [id number]   Hypocalcemia 12/30/2011   Hypertriglyceridemia 12/30/2011   Chronic pancreatitis ([place]) 12/29/2011   Hepatic steatosis 12/29/2011   HTN (hypertension) 10/15/2011   Alcohol abuse 10/15/2011   Obesity 10/15/2011    Past Surgical History:  Procedure Laterality Date   chronic pancreatitiis     COLON RESECTION     colon surgery     COLOSTOMY REVERSAL     HERNIA REPAIR     IVC FILTER PLACEMENT ([place] HX)     LITHOTRIPSY     Mucous Fistula     2013   PANCREAS SURGERY     half of pancreas removed   PANCREATITIC FISTULA     SBO         Family History  Problem Relation Age of Onset   Colon cancer Neg Hx     Social History   Tobacco Use   Smoking status: Never   Smokeless tobacco: Never  Vaping Use   Vaping Use: Never used  Substance Use Topics   Alcohol use: Not Currently   Drug use: No    Home Medications Prior to Admission medications   Medication Sig Start Date End Date Taking? Authorizing Provider  amitriptyline (ELAVIL) 150 MG tablet Take 150 mg by mouth at bedtime.  11/30/14   [provider]  cholecalciferol (VITAMIN D3) 25 MCG (1000 UT) tablet Take 3,000 Units by mouth every  morning.    [provider]  colchicine 0.6 MG tablet Take 1 tablet (0.6 mg total) by mouth daily. 03/25/20   Domenic Moras, PA-C  CREON 36000 UNITS CPEP capsule Take 36,000-108,000 Units by mouth 3 (three) times daily with meals. 1 with snacks and 3 with meals 12/11/14   [provider]  dicyclomine (BENTYL) 20 MG tablet Take 1 tablet (20 mg total) by mouth 2 (two) times daily. 01/04/20   Noemi Chapel, MD  docusate sodium (COLACE) 100 MG capsule Take 100 mg by mouth 2 (two) times daily. For constipation 12/19/14   [provider]  ferrous sulfate 325 (65 FE) MG tablet Take 325 mg by mouth daily.    [provider]  folic acid (FOLVITE) 1 MG tablet Take 1 mg by mouth every morning.   04/13/15   [provider]  HYDROcodone-acetaminophen (NORCO/VICODIN) 5-325 MG tablet Take 1-2 tablets by mouth every 6 (six) hours as needed for severe pain. 11/24/19   Joy, Shawn C, PA-C  magnesium oxide (MAG-OX) 400 (241.3 MG) MG tablet Take 1 tablet by mouth 2 (two) times daily. 01/13/15   [provider]  Multiple Vitamin (MULTIVITAMIN WITH MINERALS) TABS tablet Take 1 tablet by mouth every morning.     [provider]  ondansetron (ZOFRAN ODT) 4 MG disintegrating tablet Take 1 tablet (4 mg total) by mouth every 8 (eight) hours as needed for nausea. 01/04/20   Noemi Chapel, MD  oxyCODONE-acetaminophen (PERCOCET/ROXICET) 5-325 MG tablet Take 1 tablet by mouth every 6 (six) hours as needed for severe pain. 10/21/19   Noemi Chapel, MD  pantoprazole (PROTONIX) 40 MG tablet Take 1 tablet (40 mg total) by mouth daily. 10/21/19   Noemi Chapel, MD  polyethylene glycol [facility] / Floria Raveling) packet Take 17 g by mouth daily as needed for mild constipation. 02/10/17   Kathie Dike, MD  rivaroxaban (XARELTO) 20 MG TABS tablet Take 20 mg by mouth every morning.  10/30/16   [provider]  topiramate (TOPAMAX) 50 MG tablet Take 50-100 mg by mouth 2 (two) times daily. 1 tablet in the Am and 2 tablets at night    [provider]  vitamin B-12 (CYANOCOBALAMIN) 1000 MCG tablet Take 1,000 mcg by mouth every Monday, Wednesday, and Friday.     [provider]  diphenhydrAMINE (SOMINEX) 25 MG tablet Take 50 mg by mouth as needed. For allergic reaction  12/29/11  [provider]  nitroGLYCERIN (NITROSTAT) 0.4 MG SL tablet Place 1 tablet (0.4 mg total) under the tongue every 5 (five) minutes as needed for chest pain. 10/16/11 12/29/11  Lanier Ensign, MD  omeprazole (PRILOSEC OTC) 20 MG tablet Take 1 tablet (20 mg total) by mouth daily. 10/16/11 12/29/11  Lanier Ensign, MD    Allergies    Metrizamide, Ace inhibitors, Coconut oil, Coconut flavor, Contrast  media [iodinated diagnostic agents], and Iodine  Review of Systems   Review of Systems  Constitutional:  Positive for chills and fever.  HENT:  Negative for congestion and sore throat.   Respiratory:  Positive for cough. Negative for shortness of breath.   Cardiovascular:  Negative for chest pain.  Gastrointestinal:  Negative for abdominal pain, diarrhea, nausea and vomiting.  Genitourinary:  Negative for enuresis.  Musculoskeletal:  Negative for neck pain.  Skin:  Negative for rash.  Neurological:  Negative for dizziness and headaches.  Hematological:  Does not bruise/bleed easily.   Physical Exam Updated Vital Signs BP (!) 129/97 (BP Location: Left  Arm)   Pulse 85   Temp 98.3 F (36.8 C) (Oral)   Resp 18   Ht 5\' 10"  (1.778 m)   Wt 117.9 kg   SpO2 97%   BMI 37.31 kg/m   Physical Exam Vitals and nursing note reviewed.  Constitutional:      General: He is not in acute distress.    Appearance: He is not ill-appearing.  HENT:     Head: Normocephalic and atraumatic.     Right Ear: Tympanic membrane, ear canal and external ear normal.     Left Ear: Tympanic membrane, ear canal and external ear normal.     Nose: No congestion.     Mouth/Throat:     Mouth: Mucous membranes are moist.     Pharynx: Oropharynx is clear. No oropharyngeal exudate or posterior oropharyngeal erythema.  Eyes:     Conjunctiva/sclera: Conjunctivae normal.  Cardiovascular:     Rate and Rhythm: Normal rate and regular rhythm.     Pulses: Normal pulses.     Heart sounds: No murmur heard.   No friction rub. No gallop.  Pulmonary:     Effort: No respiratory distress.     Breath sounds: No wheezing, rhonchi or rales.  Abdominal:     Palpations: Abdomen is soft.     Tenderness: There is no abdominal tenderness. There is no right CVA tenderness or left CVA tenderness.  Musculoskeletal:     Right lower leg: No edema.     Left lower leg: No edema.  Skin:    General: Skin is warm and dry.   Neurological:     Mental Status: He is alert.  Psychiatric:        Mood and Affect: Mood normal.    ED Results / Procedures / Treatments   Labs (all labs ordered are listed, but only abnormal results are displayed) Labs Reviewed  RESP PANEL BY RT-PCR (FLU A&B, COVID) [id number]    EKG None  Radiology DG Chest 2 View  Result Date: 09/14/2021 CLINICAL DATA:  Cough EXAM: CHEST - 2 VIEW COMPARISON:  07/13/2020 FINDINGS: Heart and mediastinal contours are within normal limits. No focal opacities or effusions. No acute bony abnormality. IMPRESSION: No active cardiopulmonary disease. Electronically Signed   By: Rolm Baptise M.D.   On: 09/14/2021 09:59    Procedures Procedures   Medications Ordered in ED Medications - No data to display  ED Course  I have reviewed the triage vital signs and the nursing notes.  Pertinent labs & imaging results that were available during my care of the patient were reviewed by me and considered in my medical decision making (see chart for details).    MDM Rules/Calculators/A&P                          Initial impression-presents with a cough, alert, no acute distress, vital signs reassuring.  History is obtained respiratory panel as well as a chest x-ray.  Work-up-chest x-ray unremarkable, respiratory panel pending at this time.  Rule out- Low suspicion for systemic infection as patient is nontoxic-appearing, vital signs reassuring, no obvious source infection noted on exam.  Low suspicion for pneumonia as lung sounds are clear bilaterally, x-ray did not reveal any acute findings.  I have low suspicion for PE as patient denies pleuritic chest pain, shortness of breath, patient is PERC. low suspicion for strep throat as oropharynx was visualized, no erythema or exudates noted.  Low suspicion patient would need  hospitalized due to viral infection or Covid as vital signs reassuring, patient is not in respiratory distress.    Plan-  URI-likely viral  nature, will recommend symptom management, will defer on antiviral treatment as he is outside the treatment window.  Gave him strict return precautions.  Vital signs have remained stable, no indication for hospital admission.   Patient given at home care as well strict return precautions.  Patient verbalized that they understood agreed to said plan.  Final Clinical Impression(s) / ED Diagnoses Final diagnoses:  Viral URI with cough    Rx / DC Orders ED Discharge Orders     None        Carroll Sage, PA-C 09/14/21 1045    Tanda Rockers A, DO 09/19/21 [phone]

## 2021-09-14 NOTE — ED Triage Notes (Signed)
Pt complaining of cough for 7 days

## 2023-08-20 ENCOUNTER — Other Ambulatory Visit: Payer: Self-pay

## 2023-08-20 ENCOUNTER — Emergency Department (HOSPITAL_COMMUNITY)
Admission: EM | Admit: 2023-08-20 | Discharge: 2023-08-20 | Disposition: A | Discharge status: Home / Self Care | Payer: Medicare HMO | Attending: Emergency Medicine | Admitting: Emergency Medicine

## 2023-08-20 ENCOUNTER — Encounter (HOSPITAL_COMMUNITY): Payer: Self-pay | Admitting: Emergency Medicine

## 2023-08-20 DIAGNOSIS — M545 Low back pain, unspecified: Secondary | ICD-10-CM | POA: Insufficient documentation

## 2023-08-20 DIAGNOSIS — N189 Chronic kidney disease, unspecified: Secondary | ICD-10-CM | POA: Diagnosis not present

## 2023-08-20 DIAGNOSIS — Z7901 Long term (current) use of anticoagulants: Secondary | ICD-10-CM | POA: Insufficient documentation

## 2023-08-20 DIAGNOSIS — X500XXA Overexertion from strenuous movement or load, initial encounter: Secondary | ICD-10-CM | POA: Diagnosis not present

## 2023-08-20 MED ORDER — LIDOCAINE 5 % EX PTCH
1.0000 | MEDICATED_PATCH | CUTANEOUS | Status: DC
  Administered 2023-08-20: 1 via TRANSDERMAL

## 2023-08-20 MED ORDER — ACETAMINOPHEN 500 MG PO TABS
1000.0000 mg | ORAL_TABLET | Freq: Once | ORAL | Status: AC
  Administered 2023-08-20: 1000 mg via ORAL
  Filled 2023-08-20: qty 2

## 2023-08-20 MED ORDER — LIDOCAINE 5 % EX PTCH: 1.0000 | MEDICATED_PATCH | CUTANEOUS | 0 refills | Status: AC

## 2023-08-20 MED ORDER — CYCLOBENZAPRINE HCL 10 MG PO TABS: 10.0000 mg | ORAL_TABLET | Freq: Two times a day (BID) | ORAL | 0 refills | Status: AC | PRN

## 2023-08-20 NOTE — ED Provider Notes (Signed)
[place] EMERGENCY DEPARTMENT AT [facility] Provider Note   CSN: [id number] Arrival date & time: 08/20/23  4098     History  Chief Complaint  Patient presents with   Back Pain   HPI Peter [name] is a 52 y.o. male with history of DVTs, CKD presenting for lower back pain since this past Friday.  Patient was lifting some bricks for several hours helping a friend.  Later that evening started to feel pain in his lower left back.  Denies any saddle anesthesia, denies any urinary or bowel changes.  It is worse with movement and worse in the morning.  Has not tried anything to help his symptoms.  Still able to ambulate and bear weight.  Taking his Xarelto as prescribed for DVTs.   Back Pain      Home Medications Prior to Admission medications   Medication Sig Start Date End Date Taking? Authorizing Provider  cyclobenzaprine (FLEXERIL) 10 MG tablet Take 1 tablet (10 mg total) by mouth 2 (two) times daily as needed for muscle spasms. 08/20/23  Yes Riki Sheer K, PA-C  lidocaine (LIDODERM) 5 % Place 1 patch onto the skin daily. Remove & Discard patch within 12 hours or as directed by MD 08/20/23  Yes Gareth Eagle, PA-C  amitriptyline (ELAVIL) 150 MG tablet Take 150 mg by mouth at bedtime.  11/30/14   [provider]  cholecalciferol (VITAMIN D3) 25 MCG (1000 UT) tablet Take 3,000 Units by mouth every morning.    [provider]  colchicine 0.6 MG tablet Take 1 tablet (0.6 mg total) by mouth daily. 03/25/20   Fayrene Helper, PA-C  CREON 36000 UNITS CPEP capsule Take 36,000-108,000 Units by mouth 3 (three) times daily with meals. 1 with snacks and 3 with meals 12/11/14   [provider]  dicyclomine (BENTYL) 20 MG tablet Take 1 tablet (20 mg total) by mouth 2 (two) times daily. 01/04/20   Eber Hong, MD  docusate sodium (COLACE) 100 MG capsule Take 100 mg by mouth 2 (two) times daily. For constipation 12/19/14   [provider]  ferrous  sulfate 325 (65 FE) MG tablet Take 325 mg by mouth daily.    [provider]  folic acid (FOLVITE) 1 MG tablet Take 1 mg by mouth every morning.  04/13/15   [provider]  HYDROcodone-acetaminophen (NORCO/VICODIN) 5-325 MG tablet Take 1-2 tablets by mouth every 6 (six) hours as needed for severe pain. 11/24/19   Joy, Shawn C, PA-C  magnesium oxide (MAG-OX) 400 (241.3 MG) MG tablet Take 1 tablet by mouth 2 (two) times daily. 01/13/15   [provider]  Multiple Vitamin (MULTIVITAMIN WITH MINERALS) TABS tablet Take 1 tablet by mouth every morning.     [provider]  ondansetron (ZOFRAN ODT) 4 MG disintegrating tablet Take 1 tablet (4 mg total) by mouth every 8 (eight) hours as needed for nausea. 01/04/20   Eber Hong, MD  oxyCODONE-acetaminophen (PERCOCET/ROXICET) 5-325 MG tablet Take 1 tablet by mouth every 6 (six) hours as needed for severe pain. 10/21/19   Eber Hong, MD  pantoprazole (PROTONIX) 40 MG tablet Take 1 tablet (40 mg total) by mouth daily. 10/21/19   Eber Hong, MD  polyethylene glycol [facility] / Ethelene Hal) packet Take 17 g by mouth daily as needed for mild constipation. 02/10/17   Erick Blinks, MD  rivaroxaban (XARELTO) 20 MG TABS tablet Take 20 mg by mouth every morning.  10/30/16   [provider]  topiramate (  TOPAMAX) 50 MG tablet Take 50-100 mg by mouth 2 (two) times daily. 1 tablet in the Am and 2 tablets at night    [provider]  vitamin B-12 (CYANOCOBALAMIN) 1000 MCG tablet Take 1,000 mcg by mouth every Monday, Wednesday, and Friday.     [provider]  diphenhydrAMINE (SOMINEX) 25 MG tablet Take 50 mg by mouth as needed. For allergic reaction  12/29/11  [provider]  nitroGLYCERIN (NITROSTAT) 0.4 MG SL tablet Place 1 tablet (0.4 mg total) under the tongue every 5 (five) minutes as needed for chest pain. 10/16/11 12/29/11  Hildred Laser, MD  omeprazole (PRILOSEC OTC) 20 MG tablet Take 1 tablet (20  mg total) by mouth daily. 10/16/11 12/29/11  Hildred Laser, MD      Allergies    Metrizamide, Ace inhibitors, Coconut (cocos nucifera), Coconut flavor, Contrast media [iodinated contrast media], and Iodine    Review of Systems   Review of Systems  Musculoskeletal:  Positive for back pain.    Physical Exam Updated Vital Signs BP (!) 124/92   Pulse 64   Temp 98.8 F (37.1 C) (Oral)   Resp 20   Ht 5\' 11"  (1.803 m)   Wt 110.7 kg   SpO2 100%   BMI 34.03 kg/m  Physical Exam Constitutional:      Appearance: Normal appearance.  HENT:     Head: Normocephalic.     Nose: Nose normal.  Eyes:     Conjunctiva/sclera: Conjunctivae normal.  Pulmonary:     Effort: Pulmonary effort is normal.  Abdominal:     Tenderness: There is no abdominal tenderness. There is no right CVA tenderness.  Musculoskeletal:     Lumbar back: Tenderness present. No swelling, edema or signs of trauma. Normal range of motion.       Back:  Neurological:     Mental Status: He is alert.  Psychiatric:        Mood and Affect: Mood normal.     ED Results / Procedures / Treatments   Labs (all labs ordered are listed, but only abnormal results are displayed) Labs Reviewed - No data to display  EKG None  Radiology No results found.  Procedures Procedures    Medications Ordered in ED Medications  acetaminophen (TYLENOL) tablet 1,000 mg (has no administration in time range)  lidocaine (LIDODERM) 5 % 1 patch (has no administration in time range)    ED Course/ Medical Decision Making/ A&P                                 Medical Decision Making  52 year old well-appearing male presenting for back pain.  Exam notable for tenderness in the left lower back.  Patient also expressed some tenderness with range of motion exercises of the back as well.  Suspect this is a muscle injury secondary to lifting heavy objects on Friday.  Considered renal pathology but unlikely given lack of symptoms and no CVA  tenderness.  Treated with Tylenol and Lidoderm patch.  Sent Lidoderm patches and Flexeril at patient's request to his pharmacy.  Discussed return precautions.  Vital stable.  Discharged in good condition.  Advised to follow-up with PCP.        Final Clinical Impression(s) / ED Diagnoses Final diagnoses:  Low back pain, unspecified back pain laterality, unspecified chronicity, unspecified whether sciatica present    Rx / DC Orders ED Discharge Orders  Ordered    lidocaine (LIDODERM) 5 %  Every 24 hours        08/20/23 0938    cyclobenzaprine (FLEXERIL) 10 MG tablet  2 times daily PRN        08/20/23 0938              Gareth Eagle, PA-C 08/20/23 0938    Sloan Leiter, DO 08/21/23 [phone]

## 2023-08-20 NOTE — Discharge Instructions (Addendum)
Evaluation for your back pain is overall reassuring.  Suspect this is a muscle injury sustained on Friday while lifting the bricks.  Recommend you follow-up with PCP.  Sent lidocaine and Flexeril to your pharmacy.  You can also use ice.  Would recommend icing 3-4 times a day for 15 minutes each time for the next 4 days.

## 2023-08-20 NOTE — ED Triage Notes (Signed)
Pt presents with left lower back pain since Friday, per pt he was doing some heavy lifting before having back pain.

## 2023-09-28 ENCOUNTER — Inpatient Hospital Stay (HOSPITAL_COMMUNITY)
Admission: EM | Admit: 2023-09-28 | Discharge: 2023-10-01 | DRG: 389 | Disposition: A | Discharge status: Home / Self Care | Payer: Medicare HMO | Attending: Internal Medicine | Admitting: Internal Medicine

## 2023-09-28 ENCOUNTER — Other Ambulatory Visit: Payer: Self-pay

## 2023-09-28 ENCOUNTER — Encounter (HOSPITAL_COMMUNITY): Payer: Self-pay

## 2023-09-28 ENCOUNTER — Emergency Department (HOSPITAL_COMMUNITY): Payer: Medicare HMO

## 2023-09-28 DIAGNOSIS — E669 Obesity, unspecified: Secondary | ICD-10-CM | POA: Diagnosis present

## 2023-09-28 DIAGNOSIS — Z7901 Long term (current) use of anticoagulants: Secondary | ICD-10-CM | POA: Diagnosis not present

## 2023-09-28 DIAGNOSIS — K567 Ileus, unspecified: Principal | ICD-10-CM | POA: Diagnosis present

## 2023-09-28 DIAGNOSIS — K219 Gastro-esophageal reflux disease without esophagitis: Secondary | ICD-10-CM | POA: Diagnosis present

## 2023-09-28 DIAGNOSIS — E872 Acidosis, unspecified: Secondary | ICD-10-CM | POA: Diagnosis present

## 2023-09-28 DIAGNOSIS — R112 Nausea with vomiting, unspecified: Secondary | ICD-10-CM | POA: Diagnosis present

## 2023-09-28 DIAGNOSIS — R1032 Left lower quadrant pain: Secondary | ICD-10-CM | POA: Diagnosis not present

## 2023-09-28 DIAGNOSIS — Z87892 Personal history of anaphylaxis: Secondary | ICD-10-CM

## 2023-09-28 DIAGNOSIS — K861 Other chronic pancreatitis: Secondary | ICD-10-CM | POA: Diagnosis present

## 2023-09-28 DIAGNOSIS — D696 Thrombocytopenia, unspecified: Secondary | ICD-10-CM | POA: Diagnosis present

## 2023-09-28 DIAGNOSIS — F32A Depression, unspecified: Secondary | ICD-10-CM | POA: Diagnosis present

## 2023-09-28 DIAGNOSIS — A0811 Acute gastroenteropathy due to Norwalk agent: Secondary | ICD-10-CM | POA: Diagnosis present

## 2023-09-28 DIAGNOSIS — I82503 Chronic embolism and thrombosis of unspecified deep veins of lower extremity, bilateral: Secondary | ICD-10-CM | POA: Diagnosis present

## 2023-09-28 DIAGNOSIS — N183 Chronic kidney disease, stage 3 unspecified: Secondary | ICD-10-CM | POA: Diagnosis present

## 2023-09-28 DIAGNOSIS — Z87442 Personal history of urinary calculi: Secondary | ICD-10-CM

## 2023-09-28 DIAGNOSIS — E1165 Type 2 diabetes mellitus with hyperglycemia: Secondary | ICD-10-CM | POA: Diagnosis present

## 2023-09-28 DIAGNOSIS — Z91041 Radiographic dye allergy status: Secondary | ICD-10-CM

## 2023-09-28 DIAGNOSIS — K760 Fatty (change of) liver, not elsewhere classified: Secondary | ICD-10-CM | POA: Diagnosis present

## 2023-09-28 DIAGNOSIS — E1169 Type 2 diabetes mellitus with other specified complication: Secondary | ICD-10-CM | POA: Diagnosis present

## 2023-09-28 DIAGNOSIS — Z79899 Other long term (current) drug therapy: Secondary | ICD-10-CM | POA: Diagnosis not present

## 2023-09-28 DIAGNOSIS — R197 Diarrhea, unspecified: Secondary | ICD-10-CM | POA: Diagnosis not present

## 2023-09-28 DIAGNOSIS — E1122 Type 2 diabetes mellitus with diabetic chronic kidney disease: Secondary | ICD-10-CM | POA: Diagnosis present

## 2023-09-28 DIAGNOSIS — E785 Hyperlipidemia, unspecified: Secondary | ICD-10-CM | POA: Diagnosis present

## 2023-09-28 DIAGNOSIS — Z90411 Acquired partial absence of pancreas: Secondary | ICD-10-CM

## 2023-09-28 DIAGNOSIS — N1832 Chronic kidney disease, stage 3b: Secondary | ICD-10-CM | POA: Diagnosis present

## 2023-09-28 DIAGNOSIS — I129 Hypertensive chronic kidney disease with stage 1 through stage 4 chronic kidney disease, or unspecified chronic kidney disease: Secondary | ICD-10-CM | POA: Diagnosis present

## 2023-09-28 DIAGNOSIS — Z91018 Allergy to other foods: Secondary | ICD-10-CM

## 2023-09-28 DIAGNOSIS — R109 Unspecified abdominal pain: Secondary | ICD-10-CM | POA: Diagnosis present

## 2023-09-28 DIAGNOSIS — I82403 Acute embolism and thrombosis of unspecified deep veins of lower extremity, bilateral: Secondary | ICD-10-CM

## 2023-09-28 DIAGNOSIS — I82409 Acute embolism and thrombosis of unspecified deep veins of unspecified lower extremity: Secondary | ICD-10-CM

## 2023-09-28 DIAGNOSIS — I824Y3 Acute embolism and thrombosis of unspecified deep veins of proximal lower extremity, bilateral: Secondary | ICD-10-CM | POA: Diagnosis not present

## 2023-09-28 DIAGNOSIS — Z888 Allergy status to other drugs, medicaments and biological substances status: Secondary | ICD-10-CM

## 2023-09-28 DIAGNOSIS — Z6834 Body mass index (BMI) 34.0-34.9, adult: Secondary | ICD-10-CM

## 2023-09-28 LAB — CBC
HCT: 49.7 % (ref 39.0–52.0)
Hemoglobin: 15.7 g/dL (ref 13.0–17.0)
MCH: 26.9 pg (ref 26.0–34.0)
MCHC: 31.6 g/dL (ref 30.0–36.0)
MCV: 85.1 fL (ref 80.0–100.0)
Platelets: 146 10*3/uL — ABNORMAL LOW (ref 150–400)
RBC: 5.84 MIL/uL — ABNORMAL HIGH (ref 4.22–5.81)
RDW: 15.4 % (ref 11.5–15.5)
WBC: 6 10*3/uL (ref 4.0–10.5)
nRBC: 0 % (ref 0.0–0.2)

## 2023-09-28 LAB — URINALYSIS, ROUTINE W REFLEX MICROSCOPIC
Bacteria, UA: NONE SEEN
Bilirubin Urine: NEGATIVE
Glucose, UA: >=500 mg/dL — AB
Hgb urine dipstick: NEGATIVE
Ketones, ur: NEGATIVE mg/dL
Leukocytes,Ua: NEGATIVE
Nitrite: NEGATIVE
Protein, ur: 100 mg/dL — AB
Specific Gravity, Urine: 1.033 — ABNORMAL HIGH (ref 1.005–1.030)
pH: 6 (ref 5.0–8.0)

## 2023-09-28 LAB — COMPREHENSIVE METABOLIC PANEL
ALT: 25 U/L (ref 0–44)
AST: 28 U/L (ref 15–41)
Albumin: 5 g/dL (ref 3.5–5.0)
Alkaline Phosphatase: 85 U/L (ref 38–126)
Anion gap: 13 (ref 5–15)
BUN: 18 mg/dL (ref 6–20)
CO2: 23 mmol/L (ref 22–32)
Calcium: 9.9 mg/dL (ref 8.9–10.3)
Chloride: 101 mmol/L (ref 98–111)
Creatinine, Ser: 1.82 mg/dL — ABNORMAL HIGH (ref 0.61–1.24)
GFR, Estimated: 44 mL/min — ABNORMAL LOW (ref >60)
Glucose, Bld: 139 mg/dL — ABNORMAL HIGH (ref 70–99)
Potassium: 4.3 mmol/L (ref 3.5–5.1)
Sodium: 137 mmol/L (ref 135–145)
Total Bilirubin: 1.5 mg/dL — ABNORMAL HIGH (ref <1.2)
Total Protein: 8.6 g/dL — ABNORMAL HIGH (ref 6.5–8.1)

## 2023-09-28 LAB — GLUCOSE, CAPILLARY: Glucose-Capillary: 114 mg/dL — ABNORMAL HIGH (ref 70–99)

## 2023-09-28 LAB — LIPASE, BLOOD: Lipase: 33 U/L (ref 11–51)

## 2023-09-28 MED ORDER — MORPHINE SULFATE (PF) 4 MG/ML IV SOLN
4.0000 mg | Freq: Once | INTRAVENOUS | Status: AC
  Administered 2023-09-28: 4 mg via INTRAVENOUS
  Filled 2023-09-28: qty 1

## 2023-09-28 MED ORDER — ONDANSETRON HCL 4 MG/2ML IJ SOLN
4.0000 mg | Freq: Once | INTRAMUSCULAR | Status: AC
  Administered 2023-09-28: 4 mg via INTRAVENOUS
  Filled 2023-09-28: qty 2

## 2023-09-28 MED ORDER — SODIUM CHLORIDE 0.9 % IV SOLN: INTRAVENOUS | Status: AC

## 2023-09-28 MED ORDER — MORPHINE SULFATE (PF) 2 MG/ML IV SOLN
2.0000 mg | INTRAVENOUS | Status: DC | PRN
  Administered 2023-09-28 – 2023-09-30 (×4): 2 mg via INTRAVENOUS
  Filled 2023-09-28 (×4): qty 1

## 2023-09-28 MED ORDER — INSULIN ASPART 100 UNIT/ML IJ SOLN
0.0000 [IU] | INTRAMUSCULAR | Status: DC
Start: 2023-09-28 — End: 2023-09-29
  Administered 2023-09-29 (×4): 2 [IU] via SUBCUTANEOUS

## 2023-09-28 MED ORDER — ONDANSETRON HCL 4 MG/2ML IJ SOLN
4.0000 mg | Freq: Four times a day (QID) | INTRAMUSCULAR | Status: DC | PRN
Start: 2023-09-28 — End: 2023-10-01

## 2023-09-28 MED ORDER — ONDANSETRON HCL 4 MG PO TABS: 4.0000 mg | ORAL_TABLET | Freq: Four times a day (QID) | ORAL | Status: DC | PRN

## 2023-09-28 NOTE — ED Notes (Signed)
Patient transported to CT 

## 2023-09-28 NOTE — ED Provider Notes (Signed)
[place] [facility] AT [facility] Provider Note   CSN: [id number] Arrival date & time: 09/28/23  1532     History  Chief Complaint  Patient presents with   Abdominal Pain    Peter [name] is a 52 y.o. 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  He denies hematemesis, reports a small soft, nonbloody stool this am which did not improve or worsen his pain.  He denies fever, denies chest pain, back pain.  He has been unable to keep any PO intake down today.    Other past medical hx including Type 2 DM,  chronic kidney disease, HTN.  The history is provided by the patient.       Home Medications Prior to Admission medications   Medication Sig Start Date End Date Taking? Authorizing Provider  amitriptyline (ELAVIL) 150 MG tablet Take 150 mg by mouth at bedtime.  11/30/14   [provider]  cholecalciferol (VITAMIN D3) 25 MCG (1000 UT) tablet Take 3,000 Units by mouth every morning.    [provider]  colchicine 0.6 MG tablet Take 1 tablet (0.6 mg total) by mouth daily. 03/25/20   Fayrene Helper, PA-C  CREON 36000 UNITS CPEP capsule Take 36,000-108,000 Units by mouth 3 (three) times daily with meals. 1 with snacks and 3 with meals 12/11/14   [provider]  cyclobenzaprine (FLEXERIL) 10 MG tablet Take 1 tablet (10 mg total) by mouth 2 (two) times daily as needed for muscle spasms. 08/20/23   Gareth Eagle, PA-C  dicyclomine (BENTYL) 20 MG tablet Take 1 tablet (20 mg total) by mouth 2 (two) times daily. 01/04/20   Eber Hong, MD  docusate sodium (COLACE) 100 MG capsule Take 100 mg by mouth 2 (two) times daily. For constipation  12/19/14   [provider]  ferrous sulfate 325 (65 FE) MG tablet Take 325 mg by mouth daily.    [provider]  folic acid (FOLVITE) 1 MG tablet Take 1 mg by mouth every morning.  04/13/15   [provider]  HYDROcodone-acetaminophen (NORCO/VICODIN) 5-325 MG tablet Take 1-2 tablets by mouth every 6 (six) hours as needed for severe pain. 11/24/19   Joy, Shawn C, PA-C  lidocaine (LIDODERM) 5 % Place 1 patch onto the skin daily. Remove & Discard patch within 12 hours or as directed by MD 08/20/23   Gareth Eagle, PA-C  magnesium oxide (MAG-OX) 400 (241.3 MG) MG tablet Take 1 tablet by mouth 2 (two) times daily. 01/13/15   [provider]  Multiple Vitamin (MULTIVITAMIN WITH MINERALS) TABS tablet Take 1 tablet by mouth every morning.     [provider]  ondansetron (ZOFRAN ODT) 4 MG disintegrating tablet Take 1 tablet (4 mg total) by mouth every 8 (eight) hours as needed for nausea. 01/04/20   Eber Hong, MD  oxyCODONE-acetaminophen (PERCOCET/ROXICET) 5-325 MG tablet Take 1 tablet by mouth every 6 (six) hours as needed for severe pain. 10/21/19   Eber Hong, MD  pantoprazole (PROTONIX) 40 MG tablet Take 1 tablet (40 mg total) by mouth daily. 10/21/19   Eber Hong, MD  polyethylene glycol [facility] Ethelene Hal) packet Take 17 g by mouth daily as needed for mild constipation. 02/10/17  Erick Blinks, MD  rivaroxaban (XARELTO) 20 MG TABS tablet Take 20 mg by mouth every morning.  10/30/16   [provider]  topiramate (TOPAMAX) 50 MG tablet Take 50-100 mg by mouth 2 (two) times daily. 1 tablet in the Am and 2 tablets at night    [provider]  vitamin B-12 (CYANOCOBALAMIN) 1000 MCG tablet Take 1,000 mcg by mouth every Monday, Wednesday, and Friday.     [provider]  diphenhydrAMINE (SOMINEX) 25 MG tablet Take 50 mg by mouth as needed. For allergic reaction  12/29/11  [provider]  nitroGLYCERIN (NITROSTAT) 0.4 MG SL  tablet Place 1 tablet (0.4 mg total) under the tongue every 5 (five) minutes as needed for chest pain. 10/16/11 12/29/11  Hildred Laser, MD  omeprazole (PRILOSEC OTC) 20 MG tablet Take 1 tablet (20 mg total) by mouth daily. 10/16/11 12/29/11  Hildred Laser, MD      Allergies    Metrizamide, Ace inhibitors, Coconut (cocos nucifera), Coconut flavoring agent (non-screening), Contrast media [iodinated contrast media], and Iodine    Review of Systems   Review of Systems  Constitutional:  Negative for fever.  HENT:  Negative for congestion and sore throat.   Eyes: Negative.   Respiratory:  Negative for chest tightness and shortness of breath.   Cardiovascular:  Negative for chest pain.  Gastrointestinal:  Positive for abdominal pain, nausea and vomiting.  Genitourinary: Negative.   Musculoskeletal:  Negative for arthralgias, joint swelling and neck pain.  Skin: Negative.  Negative for rash and wound.  Neurological:  Negative for dizziness, weakness, light-headedness, numbness and headaches.  Psychiatric/Behavioral: Negative.    All other systems reviewed and are negative.   Physical Exam Updated Vital Signs BP (!) 139/91 (BP Location: Right Arm)   Pulse 97   Temp 100 F (37.8 C) (Oral)   Resp 16   Ht 5\' 11"  (1.803 m)   Wt 111.1 kg   SpO2 97%   BMI 34.17 kg/m  Physical Exam Vitals and nursing note reviewed.  Constitutional:      Appearance: He is well-developed.  HENT:     Head: Normocephalic and atraumatic.  Eyes:     Conjunctiva/sclera: Conjunctivae normal.  Cardiovascular:     Rate and Rhythm: Normal rate and regular rhythm.     Heart sounds: Normal heart sounds.  Pulmonary:     Effort: Pulmonary effort is normal.     Breath sounds: Normal breath sounds. No wheezing.  Abdominal:     General: Bowel sounds are decreased. There is distension.     Palpations: Abdomen is soft.     Tenderness: There is abdominal tenderness in the left lower quadrant. There is no  guarding or rebound.  Musculoskeletal:        General: Normal range of motion.     Cervical back: Normal range of motion.  Skin:    General: Skin is warm and dry.  Neurological:     Mental Status: He is alert.     ED Results / Procedures / Treatments   Labs (all labs ordered are listed, but only abnormal results are displayed) Labs Reviewed  COMPREHENSIVE METABOLIC PANEL - Abnormal; Notable for the following components:      Result Value   Glucose, Bld 139 (*)    Creatinine, Ser 1.82 (*)    Total Protein 8.6 (*)    Total Bilirubin 1.5 (*)    GFR, Estimated 44 (*)    All other components within normal  limits  CBC - Abnormal; Notable for the following components:   RBC 5.84 (*)    Platelets 146 (*)    All other components within normal limits  URINALYSIS, ROUTINE W REFLEX MICROSCOPIC - Abnormal; Notable for the following components:   Specific Gravity, Urine 1.033 (*)    Glucose, UA >=500 (*)    Protein, ur 100 (*)    All other components within normal limits  LIPASE, BLOOD    EKG None  Radiology CT ABDOMEN PELVIS WO CONTRAST Result Date: 09/28/2023 CLINICAL DATA:  Diverticulitis. Abdominal pain. Nausea vomiting and diarrhea. EXAM: CT ABDOMEN AND PELVIS WITHOUT CONTRAST TECHNIQUE: Multidetector CT imaging of the abdomen and pelvis was performed following the standard protocol without IV contrast. RADIATION DOSE REDUCTION: This exam was performed according to the departmental dose-optimization program which includes automated exposure control, adjustment of the mA and/or kV according to patient size and/or use of iterative reconstruction technique. COMPARISON:  CT abdomen pelvis dated 01/04/2020. FINDINGS: Evaluation of this exam is limited in the absence of intravenous contrast. Lower chest: The visualized lung bases are clear. No intra-abdominal free air or free fluid. Hepatobiliary: There is fatty infiltration of the liver. No biliary dilatation. The gallbladder is  unremarkable. Pancreas: The pancreas is atrophic.  No active inflammatory changes. Spleen: The spleen is unremarkable. Adrenals/Urinary Tract: The adrenal glands are unremarkable. There is no hydronephrosis or nephrolithiasis on either side. The visualized ureters appear unremarkable. The urinary bladder is minimally distended. There is trabeculated bladder wall suggestive of chronic bladder outlet obstruction. Stomach/Bowel: Evaluation of the bowel is limited in the absence of oral contrast. There is postsurgical changes of the bowel with anastomotic staple lines. Multiple fluid-filled loops of small bowel measure up to 4 cm in diameter in the right lateral abdomen and may represent ileus or developing obstruction. There is loose stool in the colon. There is sigmoid diverticulosis without active inflammatory changes. Thickened and nodular appearance of posterior gastric wall, not evaluated on this CT. Further evaluation with endoscopy is recommended. Appendectomy. Vascular/Lymphatic: Mild aortoiliac atherosclerotic disease. The IVC is unremarkable. No portal venous gas. There is no adenopathy. Reproductive: The prostate and seminal vesicles are grossly unremarkable. Other: Linear scarring within the abdomen as well as adhesion of the tail of the pancreas, spleen, and posterior body of the stomach. Midline anterior abdominal wall incisional scar. There is abutment of loops of small bowel to the anterior peritoneal wall compatible with adhesions. Musculoskeletal: No acute osseous pathology. IMPRESSION: 1. Small bowel ileus or developing obstruction. 2. Postsurgical changes with linear adhesion/scarring throughout the abdomen. 3. Sigmoid diverticulosis. 4. Thickened and nodular appearance of posterior gastric wall. Further evaluation with endoscopy is recommended. 5. Fatty liver. 6.  Aortic Atherosclerosis (ICD10-I70.0). Electronically Signed   By: Elgie Collard M.D.   On: 09/28/2023 17:36     Procedures Procedures    Medications Ordered in ED Medications  ondansetron (ZOFRAN) injection 4 mg (4 mg Intravenous Given 09/28/23 1710)  morphine (PF) 4 MG/ML injection 4 mg (4 mg Intravenous Given 09/28/23 1711)    ED Course/ Medical Decision Making/ A&P                                 Medical Decision Making Patient with a complicated past abdominal surgical history, colectomy, partial pancreatectomy, history of small bowel obstructions presenting with abdominal pain, distention, nausea and vomiting consistent with prior similar episodes.  He has been unable to  tolerate any p.o. intake today.  He does have some modest distention on his exam, he is quite tender in his left lower quadrant without guarding.  He has reduced bowel sounds throughout his abdomen.  He was able to tolerate a small trial of gingerale here.  We discussed admission vs trial at home, pt would prefer admission given sx.     Amount and/or Complexity of Data Reviewed Labs: ordered.    Details: See metastable, glucose 139, creatinine 1.82 which is his baseline.  He has a normal WBC count at 6.0, hemoglobin of 15.7. Radiology: ordered.    Details: CT  ileus vs early sbo.  Discussion of management or test interpretation with external provider(s): Call placed to Dr Lovell Sheehan with general surgery.  He will see pt in the am,  hospitalist to admit.  Call placed to the hospitalist service. Discussed with Dr. Thomes Dinning who agrees with admission.  Risk Decision regarding hospitalization.           Final Clinical Impression(s) / ED Diagnoses Final diagnoses:  Left lower quadrant abdominal pain  Ileus [facility])    Rx / DC Orders ED Discharge Orders     None         Victoriano Lain 09/28/23 Serita Grammes, MD 09/28/23 [phone]

## 2023-09-28 NOTE — ED Triage Notes (Signed)
LLQ ABD pain  N/V/D since this morning Denies blood in vomit or stools Denies fever

## 2023-09-28 NOTE — H&P (Signed)
History and Physical    Patient: Peter [name] NWG:[id number] DOB: [birth year] DOA: 09/28/2023 DOS: the patient was seen and examined on 09/28/2023 PCP: System, Provider Not In  Patient coming from: Home  Chief Complaint:  Chief Complaint  Patient presents with   Abdominal Pain   HPI: BRAYAM GARSEE is a 52 y.o. 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  Last bowel movement was this morning and it was a small soft, nonbloody stool.  Patient denies vomiting of blood, chest pain, shortness of breath, fever, chills, back pain.  He has been unable to to tolerate any oral intake due to vomiting.  ED Course:  In the emergency department, BP was 139/91, and other vital signs were within normal range.  Workup in the ED showed normal CBC except for platelets of 146.  BMP was normal except for blood glucose of 139, creatinine 1.82 (creatinine is within baseline range), lipase 33, urinalysis was positive for glycosuria CT abdomen pelvis showed small bowel ileus or developing obstruction.  Postsurgical changes with linear additions/scarring throughout the abdomen. General surgery (Dr. Lovell Sheehan) was consulted and recommended admitting patient with plan to consult on patient in the morning per ED PA IV morphine 4 mg x 1 was given, IV Zofran 4 mg x 1 was given.  Hospitalist was asked to admit patient for further evaluation and management.   Review of Systems: Review of systems as noted in the HPI. All other systems reviewed and are negative.   Past Medical History:  Diagnosis  Date   Bowel obstruction (HCC)    Chronic abdominal pain    Chronic back pain    CKD (chronic kidney disease)    DVT (deep venous thrombosis) (HCC)    lower extremity   Hypertension    Ileus (HCC) 01/03/2012   Kidney stones    Migraine headache    Pain management    Pancreatitis    Pancytopenia (HCC)    Retroperitoneal bleed 01/04/2012   Past Surgical History:  Procedure Laterality Date   chronic pancreatitiis     COLON RESECTION     colon surgery     COLOSTOMY REVERSAL     HERNIA REPAIR     IVC FILTER PLACEMENT ([facility] HX)     LITHOTRIPSY     Mucous Fistula     2013   PANCREAS SURGERY     half of pancreas removed   PANCREATITIC FISTULA     SBO      Social History:  reports that he has never smoked. He has never used smokeless tobacco. He reports that he does not currently use alcohol. He reports that he does not use drugs.   Allergies  Allergen Reactions   Metrizamide Anaphylaxis    Patient is allergic to IV contrast per Dr Rolland Porter.   Ace Inhibitors Swelling    Lip swelling Lip swelling Lip swelling    Coconut (Cocos Nucifera) Swelling   Coconut Flavoring Agent (Non-Screening) Swelling   Contrast Media [Iodinated Contrast Media]     Patient is allergic to IV contrast per Dr Rolland Porter.   Iodine     Patient experienced shortness of  breath and chest tightness. Albuterol and benadryl were required.    Family History  Problem Relation Age of Onset   Colon cancer Neg Hx      Prior to Admission medications   Medication Sig Start Date End Date Taking? Authorizing Provider  amitriptyline (ELAVIL) 150 MG tablet Take 150 mg by mouth at bedtime.  11/30/14   [provider]  cholecalciferol (VITAMIN D3) 25 MCG (1000 UT) tablet Take 3,000 Units by mouth every morning.    [provider]  colchicine 0.6 MG tablet Take 1 tablet (0.6 mg total) by mouth daily. 03/25/20   Fayrene Helper, PA-C  CREON 36000 UNITS CPEP capsule Take 36,000-108,000 Units by mouth  3 (three) times daily with meals. 1 with snacks and 3 with meals 12/11/14   [provider]  cyclobenzaprine (FLEXERIL) 10 MG tablet Take 1 tablet (10 mg total) by mouth 2 (two) times daily as needed for muscle spasms. 08/20/23   Gareth Eagle, PA-C  dicyclomine (BENTYL) 20 MG tablet Take 1 tablet (20 mg total) by mouth 2 (two) times daily. 01/04/20   Eber Hong, MD  docusate sodium (COLACE) 100 MG capsule Take 100 mg by mouth 2 (two) times daily. For constipation 12/19/14   [provider]  ferrous sulfate 325 (65 FE) MG tablet Take 325 mg by mouth daily.    [provider]  folic acid (FOLVITE) 1 MG tablet Take 1 mg by mouth every morning.  04/13/15   [provider]  HYDROcodone-acetaminophen (NORCO/VICODIN) 5-325 MG tablet Take 1-2 tablets by mouth every 6 (six) hours as needed for severe pain. 11/24/19   Joy, Shawn C, PA-C  lidocaine (LIDODERM) 5 % Place 1 patch onto the skin daily. Remove & Discard patch within 12 hours or as directed by MD 08/20/23   Gareth Eagle, PA-C  magnesium oxide (MAG-OX) 400 (241.3 MG) MG tablet Take 1 tablet by mouth 2 (two) times daily. 01/13/15   [provider]  Multiple Vitamin (MULTIVITAMIN WITH MINERALS) TABS tablet Take 1 tablet by mouth every morning.     [provider]  ondansetron (ZOFRAN ODT) 4 MG disintegrating tablet Take 1 tablet (4 mg total) by mouth every 8 (eight) hours as needed for nausea. 01/04/20   Eber Hong, MD  oxyCODONE-acetaminophen (PERCOCET/ROXICET) 5-325 MG tablet Take 1 tablet by mouth every 6 (six) hours as needed for severe pain. 10/21/19   Eber Hong, MD  pantoprazole (PROTONIX) 40 MG tablet Take 1 tablet (40 mg total) by mouth daily. 10/21/19   Eber Hong, MD  polyethylene glycol [facility] / Ethelene Hal) packet Take 17 g by mouth daily as needed for mild constipation. 02/10/17   Erick Blinks, MD  rivaroxaban (XARELTO) 20 MG TABS tablet Take 20 mg by mouth every morning.  10/30/16    [provider]  topiramate (TOPAMAX) 50 MG tablet Take 50-100 mg by mouth 2 (two) times daily. 1 tablet in the Am and 2 tablets at night    [provider]  vitamin B-12 (CYANOCOBALAMIN) 1000 MCG tablet Take 1,000 mcg by mouth every Monday, Wednesday, and Friday.     [provider]  diphenhydrAMINE (SOMINEX) 25 MG tablet Take 50 mg by mouth as needed. For allergic reaction  12/29/11  [provider]  nitroGLYCERIN (NITROSTAT) 0.4 MG SL tablet Place 1 tablet (0.4 mg total) under the tongue every 5 (five) minutes as needed for chest pain. 10/16/11 12/29/11  Hildred Laser, MD  omeprazole (PRILOSEC OTC) 20 MG  tablet Take 1 tablet (20 mg total) by mouth daily. 10/16/11 12/29/11  Hildred Laser, MD    Physical Exam: BP (!) 136/103 (BP Location: Left Arm)   Pulse 97   Temp 99.2 F (37.3 C) (Oral)   Resp 18   Ht 5\' 11"  (1.803 m)   Wt 109 kg   SpO2 100%   BMI 33.52 kg/m   General: 52 y.o. year-old male well developed well nourished in no acute distress.  Alert and oriented x3. HEENT: NCAT, EOMI Neck: Supple, trachea medial Cardiovascular: Regular rate and rhythm with no rubs or gallops.  No thyromegaly or JVD noted.  No lower extremity edema. 2/4 pulses in all 4 extremities. Respiratory: Clear to auscultation with no wheezes or rales. Good inspiratory effort. Abdomen: Soft, tenderness to palpation of LLQ without guarding.  Decreased bowel sounds x4 quadrants. Muskuloskeletal: No cyanosis, clubbing or edema noted bilaterally Neuro: CN II-XII intact, strength 5/5 x 4, sensation, reflexes intact Skin: No ulcerative lesions noted or rashes Psychiatry: Judgement and insight appear normal. Mood is appropriate for condition and setting          Labs on Admission:  Basic Metabolic Panel: Recent Labs  Lab 09/28/23 1600  NA 137  K 4.3  CL 101  CO2 23  GLUCOSE 139*  BUN 18  CREATININE 1.82*  CALCIUM 9.9   Liver Function Tests: Recent Labs  Lab  09/28/23 1600  AST 28  ALT 25  ALKPHOS 85  BILITOT 1.5*  PROT 8.6*  ALBUMIN 5.0   Recent Labs  Lab 09/28/23 1600  LIPASE 33   No results for input(s): "AMMONIA" in the last 168 hours. CBC: Recent Labs  Lab 09/28/23 1600  WBC 6.0  HGB 15.7  HCT 49.7  MCV 85.1  PLT 146*   Cardiac Enzymes: No results for input(s): "CKTOTAL", "CKMB", "CKMBINDEX", "TROPONINI" in the last 168 hours.  BNP (last 3 results) No results for input(s): "BNP" in the last 8760 hours.  ProBNP (last 3 results) No results for input(s): "PROBNP" in the last 8760 hours.  CBG: Recent Labs  Lab 09/28/23 2139  GLUCAP 114*    Radiological Exams on Admission: CT ABDOMEN PELVIS WO CONTRAST Result Date: 09/28/2023 CLINICAL DATA:  Diverticulitis. Abdominal pain. Nausea vomiting and diarrhea. EXAM: CT ABDOMEN AND PELVIS WITHOUT CONTRAST TECHNIQUE: Multidetector CT imaging of the abdomen and pelvis was performed following the standard protocol without IV contrast. RADIATION DOSE REDUCTION: This exam was performed according to the departmental dose-optimization program which includes automated exposure control, adjustment of the mA and/or kV according to patient size and/or use of iterative reconstruction technique. COMPARISON:  CT abdomen pelvis dated 01/04/2020. FINDINGS: Evaluation of this exam is limited in the absence of intravenous contrast. Lower chest: The visualized lung bases are clear. No intra-abdominal free air or free fluid. Hepatobiliary: There is fatty infiltration of the liver. No biliary dilatation. The gallbladder is unremarkable. Pancreas: The pancreas is atrophic.  No active inflammatory changes. Spleen: The spleen is unremarkable. Adrenals/Urinary Tract: The adrenal glands are unremarkable. There is no hydronephrosis or nephrolithiasis on either side. The visualized ureters appear unremarkable. The urinary bladder is minimally distended. There is trabeculated bladder wall suggestive of chronic  bladder outlet obstruction. Stomach/Bowel: Evaluation of the bowel is limited in the absence of oral contrast. There is postsurgical changes of the bowel with anastomotic staple lines. Multiple fluid-filled loops of small bowel measure up to 4 cm in diameter in the right lateral abdomen and may represent ileus or  developing obstruction. There is loose stool in the colon. There is sigmoid diverticulosis without active inflammatory changes. Thickened and nodular appearance of posterior gastric wall, not evaluated on this CT. Further evaluation with endoscopy is recommended. Appendectomy. Vascular/Lymphatic: Mild aortoiliac atherosclerotic disease. The IVC is unremarkable. No portal venous gas. There is no adenopathy. Reproductive: The prostate and seminal vesicles are grossly unremarkable. Other: Linear scarring within the abdomen as well as adhesion of the tail of the pancreas, spleen, and posterior body of the stomach. Midline anterior abdominal wall incisional scar. There is abutment of loops of small bowel to the anterior peritoneal wall compatible with adhesions. Musculoskeletal: No acute osseous pathology. IMPRESSION: 1. Small bowel ileus or developing obstruction. 2. Postsurgical changes with linear adhesion/scarring throughout the abdomen. 3. Sigmoid diverticulosis. 4. Thickened and nodular appearance of posterior gastric wall. Further evaluation with endoscopy is recommended. 5. Fatty liver. 6.  Aortic Atherosclerosis (ICD10-I70.0). Electronically Signed   By: Elgie Collard M.D.   On: 09/28/2023 17:36    EKG: I independently viewed the EKG done and my findings are as followed: EKG was not done in the ED  Assessment/Plan Present on Admission:  Ileus (HCC)  Abdominal pain  Nausea & vomiting  Thrombocytopenia (HCC)  Obesity  CKD (chronic kidney disease) stage 3, GFR 30-59 ml/min (HCC)  Active Problems:   Obesity   Thrombocytopenia (HCC)   Abdominal pain   CKD (chronic kidney disease) stage  3, GFR 30-59 ml/min (HCC)   Nausea & vomiting   Ileus (HCC)   Type 2 diabetes mellitus with hyperglycemia (HCC)   Acute diarrhea   DVT (deep venous thrombosis) (HCC)   Abdominal pain due to ileus vs SBO No indication for NG tube at this time, but continue to monitor patient and consider NGT when appropriate Continue NPO at this time with plan to advance diet as tolerated Continue IV hydration Continue IV morphine 2 mg q.4h p.r.n. for moderate/severe pain Continue Zofran p.r.n. for nausea/vomiting General Surgery (Dr. Lovell Sheehan) was consulted and will consult on patient in the morning per ED PA  Nausea and vomiting Continue Zofran as needed  Acute diarrhea Patient endorsed 4-6 watery diarrhea C. difficile and GI stool panel will be checked  Type 2 diabetes mellitus with hyperglycemia Hemoglobin A1c on 06/17/2023 was 6.9 (Care Everywhere) Continue ISS and hypoglycemia protocol  CKD stage IIIa/IIIb Creatinine 1.82 (creatinine is within baseline range) Renally adjust medications, avoid nephrotoxic agents/dehydration/hypotension  GERD Continue Protonix  Chronic thrombocytopenia Platelets 146, stable Continue to monitor platelet levels  History of necrotizing pancreatitis status post pancreatectomy Continue Creon  Obesity (BMI 34.17) Diet and lifestyle modification  Bilateral lower extremity DVT  Patient states that Xarelto was changed to Eliquis, and this was not noted on med rec Anticoagulant will be temporarily held at this time pending surgical consult in the morning   DVT prophylaxis: SCDs  Code Status: Full code  Family Communication: None at bedside  Consults: General Surgery  Severity of Illness: The appropriate patient status for this patient is INPATIENT. Inpatient status is judged to be reasonable and necessary in order to provide the required intensity of service to ensure the patient's safety. The patient's presenting symptoms, physical exam findings, and  initial radiographic and laboratory data in the context of their chronic comorbidities is felt to place them at high risk for further clinical deterioration. Furthermore, it is not anticipated that the patient will be medically stable for discharge from the hospital within 2 midnights of admission.   * I  certify that at the point of admission it is my clinical judgment that the patient will require inpatient hospital care spanning beyond 2 midnights from the point of admission due to high intensity of service, high risk for further deterioration and high frequency of surveillance required.*  Author: Frankey Shown, DO 09/28/2023 9:48 PM  For on call review [email or web address].

## 2023-09-28 NOTE — ED Notes (Signed)
Pt returned from CT °

## 2023-09-29 DIAGNOSIS — K567 Ileus, unspecified: Secondary | ICD-10-CM | POA: Diagnosis not present

## 2023-09-29 DIAGNOSIS — R197 Diarrhea, unspecified: Secondary | ICD-10-CM | POA: Diagnosis not present

## 2023-09-29 LAB — CBC
HCT: 48.3 % (ref 39.0–52.0)
Hemoglobin: 15 g/dL (ref 13.0–17.0)
MCH: 26 pg (ref 26.0–34.0)
MCHC: 31.1 g/dL (ref 30.0–36.0)
MCV: 83.7 fL (ref 80.0–100.0)
Platelets: 146 10*3/uL — ABNORMAL LOW (ref 150–400)
RBC: 5.77 MIL/uL (ref 4.22–5.81)
RDW: 15.2 % (ref 11.5–15.5)
WBC: 4 10*3/uL (ref 4.0–10.5)
nRBC: 0 % (ref 0.0–0.2)

## 2023-09-29 LAB — COMPREHENSIVE METABOLIC PANEL
ALT: 27 U/L (ref 0–44)
AST: 32 U/L (ref 15–41)
Albumin: 4.3 g/dL (ref 3.5–5.0)
Alkaline Phosphatase: 79 U/L (ref 38–126)
Anion gap: 12 (ref 5–15)
BUN: 22 mg/dL — ABNORMAL HIGH (ref 6–20)
CO2: 19 mmol/L — ABNORMAL LOW (ref 22–32)
Calcium: 9.3 mg/dL (ref 8.9–10.3)
Chloride: 106 mmol/L (ref 98–111)
Creatinine, Ser: 1.74 mg/dL — ABNORMAL HIGH (ref 0.61–1.24)
GFR, Estimated: 46 mL/min — ABNORMAL LOW (ref >60)
Glucose, Bld: 124 mg/dL — ABNORMAL HIGH (ref 70–99)
Potassium: 4.4 mmol/L (ref 3.5–5.1)
Sodium: 137 mmol/L (ref 135–145)
Total Bilirubin: 1.3 mg/dL — ABNORMAL HIGH (ref <1.2)
Total Protein: 8.1 g/dL (ref 6.5–8.1)

## 2023-09-29 LAB — GLUCOSE, CAPILLARY
Glucose-Capillary: 120 mg/dL — ABNORMAL HIGH (ref 70–99)
Glucose-Capillary: 121 mg/dL — ABNORMAL HIGH (ref 70–99)
Glucose-Capillary: 126 mg/dL — ABNORMAL HIGH (ref 70–99)
Glucose-Capillary: 126 mg/dL — ABNORMAL HIGH (ref 70–99)
Glucose-Capillary: 132 mg/dL — ABNORMAL HIGH (ref 70–99)
Glucose-Capillary: 86 mg/dL (ref 70–99)

## 2023-09-29 LAB — HEMOGLOBIN A1C
Hgb A1c MFr Bld: 6.9 % — ABNORMAL HIGH (ref 4.8–5.6)
Mean Plasma Glucose: 151.33 mg/dL

## 2023-09-29 LAB — C DIFFICILE QUICK SCREEN W PCR REFLEX
C Diff antigen: NEGATIVE
C Diff interpretation: NOT DETECTED
C Diff toxin: NEGATIVE

## 2023-09-29 LAB — MAGNESIUM: Magnesium: 2 mg/dL (ref 1.7–2.4)

## 2023-09-29 LAB — PHOSPHORUS: Phosphorus: 3.9 mg/dL (ref 2.5–4.6)

## 2023-09-29 MED ORDER — INSULIN ASPART 100 UNIT/ML IJ SOLN
0.0000 [IU] | Freq: Three times a day (TID) | INTRAMUSCULAR | Status: DC
  Administered 2023-09-30: 2 [IU] via SUBCUTANEOUS
  Administered 2023-09-30: 3 [IU] via SUBCUTANEOUS

## 2023-09-29 MED ORDER — SODIUM CHLORIDE 0.9 % IV SOLN: INTRAVENOUS | Status: DC

## 2023-09-29 MED ORDER — PANTOPRAZOLE SODIUM 40 MG PO TBEC
40.0000 mg | DELAYED_RELEASE_TABLET | Freq: Every day | ORAL | Status: DC
Start: 2023-09-29 — End: 2023-10-01
  Administered 2023-09-29 – 2023-10-01 (×3): 40 mg via ORAL
  Filled 2023-09-29 (×3): qty 1

## 2023-09-29 MED ORDER — PANCRELIPASE (LIP-PROT-AMYL) 12000-38000 UNITS PO CPEP
36000.0000 [IU] | ORAL_CAPSULE | Freq: Three times a day (TID) | ORAL | Status: DC
  Administered 2023-09-29 – 2023-10-01 (×6): 36000 [IU] via ORAL
  Filled 2023-09-29 (×7): qty 3

## 2023-09-29 NOTE — Progress Notes (Signed)
   09/29/23 1458  TOC Brief Assessment  Insurance and Status Reviewed  Patient has primary care physician No (No provider listed/in system)  Home environment has been reviewed From home, married  Prior level of function: Independent  Prior/Current Home Services No current home services  Social Drivers of Health Review SDOH reviewed no interventions necessary  Readmission risk has been reviewed Yes  Transition of care needs no transition of care needs at this time     Transition of Care Department [facility]) has reviewed patient and no TOC needs have been identified at this time. We will continue to monitor patient advancement through interdisciplinary progression rounds. If new patient transition needs arise, please place a TOC consult.

## 2023-09-29 NOTE — Progress Notes (Signed)
PROGRESS NOTE  Peter [name]  FIE:[id number] DOB: 1970-11-15 DOA: 09/28/2023 PCP: System, Provider Not In   Brief Narrative: Patient is a 52 year old male with history of diabetes type 2, CKD stage IIIa/IIIb, GERD, thrombocytopenia, partial pancreatectomy due to necrotizing peritonitis, colon resection/fistula formation, prior history of small bowel obstruction status post colostomy followed by reversal presented with right-sided abdominal pain, nausea, vomiting.  CT abdomen/pelvis showed small bowel ileus or developing obstruction.  General surgery consulted and following  Assessment & Plan:  Active Problems:   Obesity   Thrombocytopenia (HCC)   Abdominal pain   CKD (chronic kidney disease) stage 3, GFR 30-59 ml/min (HCC)   Nausea & vomiting   Ileus (HCC)   Type 2 diabetes mellitus with hyperglycemia (HCC)   Acute diarrhea   DVT (deep venous thrombosis) (HCC)   SBO versus ileus: Presented with abdomen pain, nausea, vomiting, diarrhea.  History of SBO in the past.  History of partial pancreatectomy/colon resection/colostomy followed by reversal.   General surgery following.  Continue IV fluid.  Not put on NG tube.  Continue pain management ,supportive care. Denies any abdominal pain today, had 2 bowel movements, has good bowel sounds.  Started on full liquid diet  Thickened and nodular appearance of posterior gastric wall.:  As seen on the imaging.  We recommend endoscopy as an outpatient, follow-up with GI  Diarrhea: GI pathogen panel pending,negative C. difficile pending  Type 2 diabetes: Last A1c of 6.9.  Continue sliding scale  CKD stage IIIb: Baseline creatinine around 1.8.  Currently kidney function at baseline  GERD: Continue Protonix  Chronic thrombocytopenia: Currently stable.  Fatty liver seen on the imaging  History of necrotizing parotiditis status post pancreatectomy: Continue creon  History of bilateral DVT: Was taking Eliquis at home.  Currently on  hold  Obesity: BMI of 33          DVT prophylaxis:SCDs Start: 09/28/23 2031     Code Status: Full Code  Family Communication: None at bedside  Patient status:Inpatient  Patient is from :home  Anticipated discharge ZY:SAYT  Estimated DC date:1-2 days   Consultants: Surgery  Procedures:None  Antimicrobials:  Anti-infectives (From admission, onward)    None       Subjective: Patient seen and examined at bedside today.  Looks comfortable.   had 2 bowel movements today.  Denies any abdomen pain, nausea or vomiting.  Has good bowel sounds.  Objective: Vitals:   09/28/23 1940 09/28/23 2040 09/29/23 0018 09/29/23 0435  BP: 139/86 (!) 136/103 112/80 113/81  Pulse: 91 97 97 92  Resp: 17 18 16 14   Temp: 99.2 F (37.3 C) 99.2 F (37.3 C) (!) 100.4 F (38 C) 99 F (37.2 C)  TempSrc: Oral Oral Oral Oral  SpO2: 97% 100% 97% 97%  Weight:  109 kg    Height:  5\' 11"  (1.803 m)      Intake/Output Summary (Last 24 hours) at 09/29/2023 0737 Last data filed at 09/29/2023 0500 Gross per 24 hour  Intake 650.21 ml  Output --  Net 650.21 ml   Filed Weights   09/28/23 1537 09/28/23 2040  Weight: 111.1 kg 109 kg    Examination:  General exam: Overall comfortable, not in distress,obese HEENT: PERRL Respiratory system:  no wheezes or crackles  Cardiovascular system: S1 & S2 heard, RRR.  Gastrointestinal system: Abdomen is nondistended, soft and nontender. Central nervous system: Alert and oriented Extremities: No edema, no clubbing ,no cyanosis Skin: No rashes, no ulcers,no icterus  Data Reviewed: I have personally reviewed following labs and imaging studies  CBC: Recent Labs  Lab 09/28/23 1600 09/29/23 0628  WBC 6.0 4.0  HGB 15.7 15.0  HCT 49.7 48.3  MCV 85.1 83.7  PLT 146* 146*   Basic Metabolic Panel: Recent Labs  Lab 09/28/23 1600  NA 137  K 4.3  CL 101  CO2 23  GLUCOSE 139*  BUN 18  CREATININE 1.82*  CALCIUM 9.9     No results  found for this or any previous visit (from the past 240 hours).   Radiology Studies: CT ABDOMEN PELVIS WO CONTRAST Result Date: 09/28/2023 CLINICAL DATA:  Diverticulitis. Abdominal pain. Nausea vomiting and diarrhea. EXAM: CT ABDOMEN AND PELVIS WITHOUT CONTRAST TECHNIQUE: Multidetector CT imaging of the abdomen and pelvis was performed following the standard protocol without IV contrast. RADIATION DOSE REDUCTION: This exam was performed according to the departmental dose-optimization program which includes automated exposure control, adjustment of the mA and/or kV according to patient size and/or use of iterative reconstruction technique. COMPARISON:  CT abdomen pelvis dated 01/04/2020. FINDINGS: Evaluation of this exam is limited in the absence of intravenous contrast. Lower chest: The visualized lung bases are clear. No intra-abdominal free air or free fluid. Hepatobiliary: There is fatty infiltration of the liver. No biliary dilatation. The gallbladder is unremarkable. Pancreas: The pancreas is atrophic.  No active inflammatory changes. Spleen: The spleen is unremarkable. Adrenals/Urinary Tract: The adrenal glands are unremarkable. There is no hydronephrosis or nephrolithiasis on either side. The visualized ureters appear unremarkable. The urinary bladder is minimally distended. There is trabeculated bladder wall suggestive of chronic bladder outlet obstruction. Stomach/Bowel: Evaluation of the bowel is limited in the absence of oral contrast. There is postsurgical changes of the bowel with anastomotic staple lines. Multiple fluid-filled loops of small bowel measure up to 4 cm in diameter in the right lateral abdomen and may represent ileus or developing obstruction. There is loose stool in the colon. There is sigmoid diverticulosis without active inflammatory changes. Thickened and nodular appearance of posterior gastric wall, not evaluated on this CT. Further evaluation with endoscopy is recommended.  Appendectomy. Vascular/Lymphatic: Mild aortoiliac atherosclerotic disease. The IVC is unremarkable. No portal venous gas. There is no adenopathy. Reproductive: The prostate and seminal vesicles are grossly unremarkable. Other: Linear scarring within the abdomen as well as adhesion of the tail of the pancreas, spleen, and posterior body of the stomach. Midline anterior abdominal wall incisional scar. There is abutment of loops of small bowel to the anterior peritoneal wall compatible with adhesions. Musculoskeletal: No acute osseous pathology. IMPRESSION: 1. Small bowel ileus or developing obstruction. 2. Postsurgical changes with linear adhesion/scarring throughout the abdomen. 3. Sigmoid diverticulosis. 4. Thickened and nodular appearance of posterior gastric wall. Further evaluation with endoscopy is recommended. 5. Fatty liver. 6.  Aortic Atherosclerosis (ICD10-I70.0). Electronically Signed   By: Elgie Collard M.D.   On: 09/28/2023 17:36    Scheduled Meds:  insulin aspart  0-15 Units Subcutaneous Q4H   lipase/protease/amylase  36,000-108,000 Units Oral TID WC   pantoprazole  40 mg Oral Daily   Continuous Infusions:   LOS: 1 day   Burnadette Pop, MD [facility] P12/22/2024, 7:37 AM

## 2023-09-29 NOTE — Consult Note (Signed)
Reason for Consult: Ileus versus early bowel obstruction Referring Physician: Dr. Carlyle Dolly is an 52 y.o. male.  HPI: Patient is a 52 year old black male with an extensive abdominal surgical history including colon resection, partial pancreatectomy, and bowel obstruction who presents with nausea, vomiting, and diarrhea.  Patient states he had pizza and chicken on Friday night and yesterday morning started to having episodes of nausea, vomiting, and diarrhea.  A CT scan of the abdomen pelvis revealed possible ileus versus early small bowel obstruction.  He was admitted to the hospital and has had 4 watery bowel movements since his admission and 1 episode of emesis.  He currently does not feel nauseated and denies any significant abdominal pain.  He denies any fever or chills.  Past Medical History:  Diagnosis Date   Bowel obstruction (HCC)    Chronic abdominal pain    Chronic back pain    CKD (chronic kidney disease)    DVT (deep venous thrombosis) (HCC)    lower extremity   Hypertension    Ileus (HCC) 01/03/2012   Kidney stones    Migraine headache    Pain management    Pancreatitis    Pancytopenia (HCC)    Retroperitoneal bleed 01/04/2012    Past Surgical History:  Procedure Laterality Date   chronic pancreatitiis     COLON RESECTION     colon surgery     COLOSTOMY REVERSAL     HERNIA REPAIR     IVC FILTER PLACEMENT ([facility] HX)     LITHOTRIPSY     Mucous Fistula     2013   PANCREAS SURGERY     half of pancreas removed   PANCREATITIC FISTULA     SBO      Family History  Problem Relation Age of Onset   Colon cancer Neg Hx     Social History:  reports that he has never smoked. He has never used smokeless tobacco. He reports that he does not currently use alcohol. He reports that he does not use drugs.  Allergies:  Allergies  Allergen Reactions   Metrizamide Anaphylaxis    Patient is allergic to IV contrast per Dr Rolland Porter.   Ace Inhibitors Swelling     Lip swelling Lip swelling Lip swelling    Coconut (Cocos Nucifera) Swelling   Coconut Flavoring Agent (Non-Screening) Swelling   Contrast Media [Iodinated Contrast Media]     Patient is allergic to IV contrast per Dr Rolland Porter.   Iodine     Patient experienced shortness of breath and chest tightness. Albuterol and benadryl were required.    Medications: I have reviewed the patient's current medications. Prior to Admission:  Medications Prior to Admission  Medication Sig Dispense Refill Last Dose/Taking   amitriptyline (ELAVIL) 150 MG tablet Take 150 mg by mouth at bedtime.       cholecalciferol (VITAMIN D3) 25 MCG (1000 UT) tablet Take 3,000 Units by mouth every morning.      colchicine 0.6 MG tablet Take 1 tablet (0.6 mg total) by mouth daily. 10 tablet 0    CREON 36000 UNITS CPEP capsule Take 36,000-108,000 Units by mouth 3 (three) times daily with meals. 1 with snacks and 3 with meals      cyclobenzaprine (FLEXERIL) 10 MG tablet Take 1 tablet (10 mg total) by mouth 2 (two) times daily as needed for muscle spasms. 20 tablet 0    dicyclomine (BENTYL) 20 MG tablet Take 1 tablet (20 mg total) by mouth  2 (two) times daily. 20 tablet 0    docusate sodium (COLACE) 100 MG capsule Take 100 mg by mouth 2 (two) times daily. For constipation      ferrous sulfate 325 (65 FE) MG tablet Take 325 mg by mouth daily.      folic acid (FOLVITE) 1 MG tablet Take 1 mg by mouth every morning.       HYDROcodone-acetaminophen (NORCO/VICODIN) 5-325 MG tablet Take 1-2 tablets by mouth every 6 (six) hours as needed for severe pain. 10 tablet 0    lidocaine (LIDODERM) 5 % Place 1 patch onto the skin daily. Remove & Discard patch within 12 hours or as directed by MD 30 patch 0    magnesium oxide (MAG-OX) 400 (241.3 MG) MG tablet Take 1 tablet by mouth 2 (two) times daily.      Multiple Vitamin (MULTIVITAMIN WITH MINERALS) TABS tablet Take 1 tablet by mouth every morning.       ondansetron (ZOFRAN ODT) 4 MG  disintegrating tablet Take 1 tablet (4 mg total) by mouth every 8 (eight) hours as needed for nausea. 10 tablet 0    oxyCODONE-acetaminophen (PERCOCET/ROXICET) 5-325 MG tablet Take 1 tablet by mouth every 6 (six) hours as needed for severe pain. 10 tablet 0    pantoprazole (PROTONIX) 40 MG tablet Take 1 tablet (40 mg total) by mouth daily. 30 tablet 2    polyethylene glycol (MIRALAX / GLYCOLAX) packet Take 17 g by mouth daily as needed for mild constipation. 30 each 0    rivaroxaban (XARELTO) 20 MG TABS tablet Take 20 mg by mouth every morning.       topiramate (TOPAMAX) 50 MG tablet Take 50-100 mg by mouth 2 (two) times daily. 1 tablet in the Am and 2 tablets at night      vitamin B-12 (CYANOCOBALAMIN) 1000 MCG tablet Take 1,000 mcg by mouth every Monday, Wednesday, and Friday.        Results for orders placed or performed during the hospital encounter of 09/28/23 (from the past 48 hours)  Urinalysis, Routine w reflex microscopic -Urine, Clean Catch     Status: Abnormal   Collection Time: 09/28/23  3:39 PM  Result Value Ref Range   Color, Urine YELLOW YELLOW   APPearance CLEAR CLEAR   Specific Gravity, Urine 1.033 (H) 1.005 - 1.030   pH 6.0 5.0 - 8.0   Glucose, UA >=500 (A) NEGATIVE mg/dL   Hgb urine dipstick NEGATIVE NEGATIVE   Bilirubin Urine NEGATIVE NEGATIVE   Ketones, ur NEGATIVE NEGATIVE mg/dL   Protein, ur 161 (A) NEGATIVE mg/dL   Nitrite NEGATIVE NEGATIVE   Leukocytes,Ua NEGATIVE NEGATIVE   RBC / HPF 0-5 0 - 5 RBC/hpf   WBC, UA 0-5 0 - 5 WBC/hpf   Bacteria, UA NONE SEEN NONE SEEN   Squamous Epithelial / HPF 0-5 0 - 5 /HPF   Mucus PRESENT     Comment: Performed at [facility], [address] [place], [place] [address]  Lipase, blood     Status: None   Collection Time: 09/28/23  4:00 PM  Result Value Ref Range   Lipase 33 11 - 51 U/L    Comment: Performed at [facility], [address] [place], [place] [address]  Comprehensive metabolic panel     Status: Abnormal    Collection Time: 09/28/23  4:00 PM  Result Value Ref Range   Sodium 137 135 - 145 mmol/L   Potassium 4.3 3.5 - 5.1 mmol/L   Chloride 101  98 - 111 mmol/L   CO2 23 22 - 32 mmol/L   Glucose, Bld 139 (H) 70 - 99 mg/dL    Comment: Glucose reference range applies only to samples taken after fasting for at least 8 hours.   BUN 18 6 - 20 mg/dL   Creatinine, Ser 1.61 (H) 0.61 - 1.24 mg/dL   Calcium 9.9 8.9 - 09.6 mg/dL   Total Protein 8.6 (H) 6.5 - 8.1 g/dL   Albumin 5.0 3.5 - 5.0 g/dL   AST 28 15 - 41 U/L   ALT 25 0 - 44 U/L   Alkaline Phosphatase 85 38 - 126 U/L   Total Bilirubin 1.5 (H) <1.2 mg/dL   GFR, Estimated 44 (L) >60 mL/min    Comment: (NOTE) Calculated using the CKD-EPI Creatinine Equation (2021)    Anion gap 13 5 - 15    Comment: Performed at [facility], [address] [place], [place] [address]  CBC     Status: Abnormal   Collection Time: 09/28/23  4:00 PM  Result Value Ref Range   WBC 6.0 4.0 - 10.5 K/uL   RBC 5.84 (H) 4.22 - 5.81 MIL/uL   Hemoglobin 15.7 13.0 - 17.0 g/dL   HCT 98.1 19.1 - 47.8 %   MCV 85.1 80.0 - 100.0 fL   MCH 26.9 26.0 - 34.0 pg   MCHC 31.6 30.0 - 36.0 g/dL   RDW 29.5 62.1 - 30.8 %   Platelets 146 (L) 150 - 400 K/uL   nRBC 0.0 0.0 - 0.2 %    Comment: Performed at [facility], [address] [place], [place] [address]  Glucose, capillary     Status: Abnormal   Collection Time: 09/28/23  9:39 PM  Result Value Ref Range   Glucose-Capillary 114 (H) 70 - 99 mg/dL    Comment: Glucose reference range applies only to samples taken after fasting for at least 8 hours.  Glucose, capillary     Status: Abnormal   Collection Time: 09/29/23 12:17 AM  Result Value Ref Range   Glucose-Capillary 120 (H) 70 - 99 mg/dL    Comment: Glucose reference range applies only to samples taken after fasting for at least 8 hours.  Glucose, capillary     Status: Abnormal   Collection Time: 09/29/23  4:37 AM  Result Value Ref Range   Glucose-Capillary 132 (H) 70 - 99  mg/dL    Comment: Glucose reference range applies only to samples taken after fasting for at least 8 hours.  Comprehensive metabolic panel     Status: Abnormal   Collection Time: 09/29/23  6:28 AM  Result Value Ref Range   Sodium 137 135 - 145 mmol/L   Potassium 4.4 3.5 - 5.1 mmol/L   Chloride 106 98 - 111 mmol/L   CO2 19 (L) 22 - 32 mmol/L   Glucose, Bld 124 (H) 70 - 99 mg/dL    Comment: Glucose reference range applies only to samples taken after fasting for at least 8 hours.   BUN 22 (H) 6 - 20 mg/dL   Creatinine, Ser 6.96 (H) 0.61 - 1.24 mg/dL   Calcium 9.3 8.9 - 29.5 mg/dL   Total Protein 8.1 6.5 - 8.1 g/dL   Albumin 4.3 3.5 - 5.0 g/dL   AST 32 15 - 41 U/L   ALT 27 0 - 44 U/L   Alkaline Phosphatase 79 38 - 126 U/L   Total Bilirubin 1.3 (H) <1.2 mg/dL   GFR, Estimated 46 (L) >60 mL/min  Comment: (NOTE) Calculated using the CKD-EPI Creatinine Equation (2021)    Anion gap 12 5 - 15    Comment: Performed at [facility], [address] [place], [place] [address]  CBC     Status: Abnormal   Collection Time: 09/29/23  6:28 AM  Result Value Ref Range   WBC 4.0 4.0 - 10.5 K/uL   RBC 5.77 4.22 - 5.81 MIL/uL   Hemoglobin 15.0 13.0 - 17.0 g/dL   HCT 40.1 02.7 - 25.3 %   MCV 83.7 80.0 - 100.0 fL   MCH 26.0 26.0 - 34.0 pg   MCHC 31.1 30.0 - 36.0 g/dL   RDW 66.4 40.3 - 47.4 %   Platelets 146 (L) 150 - 400 K/uL   nRBC 0.0 0.0 - 0.2 %    Comment: Performed at [facility], [address], [place], [place] [address]  Magnesium     Status: None   Collection Time: 09/29/23  6:28 AM  Result Value Ref Range   Magnesium 2.0 1.7 - 2.4 mg/dL    Comment: Performed at [facility], [address], [place], [place] [address]  Phosphorus     Status: None   Collection Time: 09/29/23  6:28 AM  Result Value Ref Range   Phosphorus 3.9 2.5 - 4.6 mg/dL    Comment: Performed at [facility], [address] [place], [place] [address]  Glucose, capillary     Status: Abnormal   Collection Time:  09/29/23  7:19 AM  Result Value Ref Range   Glucose-Capillary 126 (H) 70 - 99 mg/dL    Comment: Glucose reference range applies only to samples taken after fasting for at least 8 hours.    CT ABDOMEN PELVIS WO CONTRAST Result Date: 09/28/2023 CLINICAL DATA:  Diverticulitis. Abdominal pain. Nausea vomiting and diarrhea. EXAM: CT ABDOMEN AND PELVIS WITHOUT CONTRAST TECHNIQUE: Multidetector CT imaging of the abdomen and pelvis was performed following the standard protocol without IV contrast. RADIATION DOSE REDUCTION: This exam was performed according to the departmental dose-optimization program which includes automated exposure control, adjustment of the mA and/or kV according to patient size and/or use of iterative reconstruction technique. COMPARISON:  CT abdomen pelvis dated 01/04/2020. FINDINGS: Evaluation of this exam is limited in the absence of intravenous contrast. Lower chest: The visualized lung bases are clear. No intra-abdominal free air or free fluid. Hepatobiliary: There is fatty infiltration of the liver. No biliary dilatation. The gallbladder is unremarkable. Pancreas: The pancreas is atrophic.  No active inflammatory changes. Spleen: The spleen is unremarkable. Adrenals/Urinary Tract: The adrenal glands are unremarkable. There is no hydronephrosis or nephrolithiasis on either side. The visualized ureters appear unremarkable. The urinary bladder is minimally distended. There is trabeculated bladder wall suggestive of chronic bladder outlet obstruction. Stomach/Bowel: Evaluation of the bowel is limited in the absence of oral contrast. There is postsurgical changes of the bowel with anastomotic staple lines. Multiple fluid-filled loops of small bowel measure up to 4 cm in diameter in the right lateral abdomen and may represent ileus or developing obstruction. There is loose stool in the colon. There is sigmoid diverticulosis without active inflammatory changes. Thickened and nodular appearance  of posterior gastric wall, not evaluated on this CT. Further evaluation with endoscopy is recommended. Appendectomy. Vascular/Lymphatic: Mild aortoiliac atherosclerotic disease. The IVC is unremarkable. No portal venous gas. There is no adenopathy. Reproductive: The prostate and seminal vesicles are grossly unremarkable. Other: Linear scarring within the abdomen as well as adhesion of the tail of the pancreas, spleen,  and posterior body of the stomach. Midline anterior abdominal wall incisional scar. There is abutment of loops of small bowel to the anterior peritoneal wall compatible with adhesions. Musculoskeletal: No acute osseous pathology. IMPRESSION: 1. Small bowel ileus or developing obstruction. 2. Postsurgical changes with linear adhesion/scarring throughout the abdomen. 3. Sigmoid diverticulosis. 4. Thickened and nodular appearance of posterior gastric wall. Further evaluation with endoscopy is recommended. 5. Fatty liver. 6.  Aortic Atherosclerosis (ICD10-I70.0). Electronically Signed   By: Elgie Collard M.D.   On: 09/28/2023 17:36    ROS:  Pertinent items are noted in HPI.  Blood pressure 113/81, pulse 92, temperature 99 F (37.2 C), temperature source Oral, resp. rate 14, height 5\' 11"  (1.803 m), weight 109 kg, SpO2 97%. Physical Exam: Pleasant black male in no acute distress Head is normocephalic, atraumatic Abdomen is soft, nontender.  Multiple surgical scars present.  CT scan images personally reviewed  Assessment/Plan: Impression: Diarrhea with episodes of nausea and vomiting.  In reviewing the CT scan, there is no transition zone and complete bowel obstruction is less likely.  Workup of the etiology of the diarrhea is pending with stool cultures.  No need for acute surgical intervention at the present time.  Will start patient on a full liquid diet.  Will follow with you.  Franky Macho 09/29/2023, 8:23 AM

## 2023-09-29 NOTE — Plan of Care (Signed)
  Problem: Education: Goal: Knowledge of General Education information will improve Description: Including pain rating scale, medication(s)/side effects and non-pharmacologic comfort measures Outcome: Progressing   Problem: Health Behavior/Discharge Planning: Goal: Ability to manage health-related needs will improve Outcome: Progressing   Problem: Nutrition: Goal: Adequate nutrition will be maintained Outcome: Progressing   Problem: Elimination: Goal: Will not experience complications related to bowel motility Outcome: Progressing   Problem: Pain Management: Goal: General experience of comfort will improve Outcome: Progressing   Problem: Safety: Goal: Ability to remain free from injury will improve Outcome: Progressing

## 2023-09-30 DIAGNOSIS — F32A Depression, unspecified: Secondary | ICD-10-CM

## 2023-09-30 DIAGNOSIS — I824Y3 Acute embolism and thrombosis of unspecified deep veins of proximal lower extremity, bilateral: Secondary | ICD-10-CM

## 2023-09-30 DIAGNOSIS — R197 Diarrhea, unspecified: Secondary | ICD-10-CM | POA: Diagnosis not present

## 2023-09-30 DIAGNOSIS — K567 Ileus, unspecified: Secondary | ICD-10-CM | POA: Diagnosis not present

## 2023-09-30 DIAGNOSIS — N1832 Chronic kidney disease, stage 3b: Secondary | ICD-10-CM | POA: Diagnosis not present

## 2023-09-30 DIAGNOSIS — K861 Other chronic pancreatitis: Secondary | ICD-10-CM

## 2023-09-30 DIAGNOSIS — E1165 Type 2 diabetes mellitus with hyperglycemia: Secondary | ICD-10-CM | POA: Diagnosis not present

## 2023-09-30 LAB — GASTROINTESTINAL PANEL BY PCR, STOOL (REPLACES STOOL CULTURE)

## 2023-09-30 LAB — GLUCOSE, CAPILLARY
Glucose-Capillary: 124 mg/dL — ABNORMAL HIGH (ref 70–99)
Glucose-Capillary: 151 mg/dL — ABNORMAL HIGH (ref 70–99)
Glucose-Capillary: 110 mg/dL — ABNORMAL HIGH (ref 70–99)
Glucose-Capillary: 152 mg/dL — ABNORMAL HIGH (ref 70–99)

## 2023-09-30 LAB — HIV ANTIBODY (ROUTINE TESTING W REFLEX): HIV Screen 4th Generation wRfx: NONREACTIVE

## 2023-09-30 MED ORDER — FERROUS SULFATE 325 (65 FE) MG PO TABS
325.0000 mg | ORAL_TABLET | Freq: Every day | ORAL | Status: DC
  Administered 2023-10-01: 325 mg via ORAL
  Filled 2023-09-30: qty 1

## 2023-09-30 MED ORDER — FOLIC ACID 1 MG PO TABS
1.0000 mg | ORAL_TABLET | Freq: Every morning | ORAL | Status: DC
Start: 2023-10-01 — End: 2023-10-01
  Administered 2023-10-01: 1 mg via ORAL
  Filled 2023-09-30: qty 1

## 2023-09-30 MED ORDER — AMITRIPTYLINE HCL 25 MG PO TABS
150.0000 mg | ORAL_TABLET | Freq: Every day | ORAL | Status: DC
Start: 2023-09-30 — End: 2023-09-30

## 2023-09-30 MED ORDER — ATORVASTATIN CALCIUM 40 MG PO TABS
80.0000 mg | ORAL_TABLET | Freq: Every day | ORAL | Status: DC
  Administered 2023-09-30 – 2023-10-01 (×2): 80 mg via ORAL
  Filled 2023-09-30 (×2): qty 2

## 2023-09-30 MED ORDER — CYCLOBENZAPRINE HCL 10 MG PO TABS: 10.0000 mg | ORAL_TABLET | Freq: Two times a day (BID) | ORAL | Status: DC | PRN

## 2023-09-30 MED ORDER — APIXABAN 5 MG PO TABS
5.0000 mg | ORAL_TABLET | Freq: Two times a day (BID) | ORAL | Status: DC
Start: 2023-09-30 — End: 2023-10-01
  Administered 2023-09-30 – 2023-10-01 (×2): 5 mg via ORAL
  Filled 2023-09-30 (×2): qty 1

## 2023-09-30 MED ORDER — ORAL CARE MOUTH RINSE: 15.0000 mL | OROMUCOSAL | Status: DC | PRN

## 2023-09-30 MED ORDER — TOPIRAMATE 25 MG PO TABS
50.0000 mg | ORAL_TABLET | Freq: Every day | ORAL | Status: DC
Start: 2023-10-01 — End: 2023-10-01
  Administered 2023-10-01: 50 mg via ORAL
  Filled 2023-09-30: qty 2

## 2023-09-30 MED ORDER — TOPIRAMATE 100 MG PO TABS
100.0000 mg | ORAL_TABLET | Freq: Every day | ORAL | Status: DC
  Administered 2023-09-30: 100 mg via ORAL
  Filled 2023-09-30: qty 1

## 2023-09-30 MED ORDER — SODIUM BICARBONATE 650 MG PO TABS
650.0000 mg | ORAL_TABLET | Freq: Two times a day (BID) | ORAL | Status: DC
Start: 2023-09-30 — End: 2023-10-01
  Administered 2023-09-30 – 2023-10-01 (×2): 650 mg via ORAL
  Filled 2023-09-30 (×2): qty 1

## 2023-09-30 MED ORDER — RIVAROXABAN 20 MG PO TABS: 20.0000 mg | ORAL_TABLET | Freq: Every morning | ORAL | Status: DC

## 2023-09-30 NOTE — Hospital Course (Signed)
Mr. Derda was admitted to the hospital with the working  diagnosis of ileus due to norovirus.   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  CT abdomen/pelvis showed small bowel ileus or developing obstruction.  General surgery consulted and following

## 2023-09-30 NOTE — Assessment & Plan Note (Signed)
Continue with amitriptyline and topiramate.

## 2023-09-30 NOTE — Assessment & Plan Note (Addendum)
Continue anticoagulation with apixaban.  ?

## 2023-09-30 NOTE — Plan of Care (Signed)
  Problem: Education: Goal: Knowledge of General Education information will improve Description: Including pain rating scale, medication(s)/side effects and non-pharmacologic comfort measures Outcome: Progressing   Problem: Health Behavior/Discharge Planning: Goal: Ability to manage health-related needs will improve Outcome: Progressing   Problem: Clinical Measurements: Goal: Ability to maintain clinical measurements within normal limits will improve Outcome: Progressing Goal: Will remain free from infection Outcome: Progressing Goal: Diagnostic test results will improve Outcome: Progressing Goal: Respiratory complications will improve Outcome: Progressing Goal: Cardiovascular complication will be avoided Outcome: Progressing   Problem: Activity: Goal: Risk for activity intolerance will decrease Outcome: Progressing   Problem: Nutrition: Goal: Adequate nutrition will be maintained Outcome: Progressing   Problem: Elimination: Goal: Will not experience complications related to bowel motility Outcome: Progressing Goal: Will not experience complications related to urinary retention Outcome: Progressing   Problem: Pain Management: Goal: General experience of comfort will improve Outcome: Progressing   Problem: Safety: Goal: Ability to remain free from injury will improve Outcome: Progressing   Problem: Skin Integrity: Goal: Risk for impaired skin integrity will decrease Outcome: Progressing   Problem: Education: Goal: Ability to describe self-care measures that may prevent or decrease complications (Diabetes Survival Skills Education) will improve Outcome: Progressing Goal: Individualized Educational Video(s) Outcome: Progressing   Problem: Coping: Goal: Ability to adjust to condition or change in health will improve Outcome: Progressing   Problem: Fluid Volume: Goal: Ability to maintain a balanced intake and output will improve Outcome: Progressing   Problem:  Health Behavior/Discharge Planning: Goal: Ability to identify and utilize available resources and services will improve Outcome: Progressing Goal: Ability to manage health-related needs will improve Outcome: Progressing   Problem: Metabolic: Goal: Ability to maintain appropriate glucose levels will improve Outcome: Progressing   Problem: Nutritional: Goal: Maintenance of adequate nutrition will improve Outcome: Progressing Goal: Progress toward achieving an optimal weight will improve Outcome: Progressing   Problem: Skin Integrity: Goal: Risk for impaired skin integrity will decrease Outcome: Progressing   Problem: Tissue Perfusion: Goal: Adequacy of tissue perfusion will improve Outcome: Progressing

## 2023-09-30 NOTE — Assessment & Plan Note (Signed)
 Continue with pancreatic enzymes.

## 2023-09-30 NOTE — Assessment & Plan Note (Signed)
Calculated BMI is 33.5

## 2023-09-30 NOTE — Assessment & Plan Note (Signed)
Sable cell count.

## 2023-09-30 NOTE — Assessment & Plan Note (Signed)
CKD stage 3b . Non anion gap metabolic acidosis.  Renal function has been stable, today renal function with serum cr at 1,74 with K at 4,4 and serum bicarbonate at 19  Na 137  Mg 2.0   Will add sodium bicarbonate today  and follow up electrolytes in am.  Patient with improvement in po intake.

## 2023-09-30 NOTE — Assessment & Plan Note (Addendum)
Norovirus gastroenteritis  Patient with progressive improvement in his symptoms, diarrhea is improving, no abdominal pain, no nausea or vomiting.  Passing gas.   Supportive medical care with antiacids, analgesics and antiemetics.  Plan to continue monitor improvement of his symptoms, and discharge home tomorrow.

## 2023-09-30 NOTE — Progress Notes (Signed)
Subjective: Patient is hungry.  Denies any nausea or vomiting.  Is having multiple stools.  Objective: Vital signs in last 24 hours: Temp:  [98.4 F (36.9 C)-98.9 F (37.2 C)] 98.4 F (36.9 C) (12/23 0609) Pulse Rate:  [68-73] 68 (12/23 0609) Resp:  [18-20] 18 (12/23 0609) BP: (113-114)/(80-88) 114/80 (12/23 0609) SpO2:  [97 %-98 %] 98 % (12/23 0609) Last BM Date : 09/29/23  Intake/Output from previous day: No intake/output data recorded. Intake/Output this shift: Total I/O In: 240 [P.O.:240] Out: -   General appearance: alert, cooperative, and no distress GI: soft, non-tender; bowel sounds normal; no masses,  no organomegaly  Lab Results:  Recent Labs    09/28/23 1600 09/29/23 0628  WBC 6.0 4.0  HGB 15.7 15.0  HCT 49.7 48.3  PLT 146* 146*   BMET Recent Labs    09/28/23 1600 09/29/23 0628  NA 137 137  K 4.3 4.4  CL 101 106  CO2 23 19*  GLUCOSE 139* 124*  BUN 18 22*  CREATININE 1.82* 1.74*  CALCIUM 9.9 9.3   PT/INR No results for input(s): "LABPROT", "INR" in the last 72 hours.  Studies/Results: CT ABDOMEN PELVIS WO CONTRAST Result Date: 09/28/2023 CLINICAL DATA:  Diverticulitis. Abdominal pain. Nausea vomiting and diarrhea. EXAM: CT ABDOMEN AND PELVIS WITHOUT CONTRAST TECHNIQUE: Multidetector CT imaging of the abdomen and pelvis was performed following the standard protocol without IV contrast. RADIATION DOSE REDUCTION: This exam was performed according to the departmental dose-optimization program which includes automated exposure control, adjustment of the mA and/or kV according to patient size and/or use of iterative reconstruction technique. COMPARISON:  CT abdomen pelvis dated 01/04/2020. FINDINGS: Evaluation of this exam is limited in the absence of intravenous contrast. Lower chest: The visualized lung bases are clear. No intra-abdominal free air or free fluid. Hepatobiliary: There is fatty infiltration of the liver. No biliary dilatation. The  gallbladder is unremarkable. Pancreas: The pancreas is atrophic.  No active inflammatory changes. Spleen: The spleen is unremarkable. Adrenals/Urinary Tract: The adrenal glands are unremarkable. There is no hydronephrosis or nephrolithiasis on either side. The visualized ureters appear unremarkable. The urinary bladder is minimally distended. There is trabeculated bladder wall suggestive of chronic bladder outlet obstruction. Stomach/Bowel: Evaluation of the bowel is limited in the absence of oral contrast. There is postsurgical changes of the bowel with anastomotic staple lines. Multiple fluid-filled loops of small bowel measure up to 4 cm in diameter in the right lateral abdomen and may represent ileus or developing obstruction. There is loose stool in the colon. There is sigmoid diverticulosis without active inflammatory changes. Thickened and nodular appearance of posterior gastric wall, not evaluated on this CT. Further evaluation with endoscopy is recommended. Appendectomy. Vascular/Lymphatic: Mild aortoiliac atherosclerotic disease. The IVC is unremarkable. No portal venous gas. There is no adenopathy. Reproductive: The prostate and seminal vesicles are grossly unremarkable. Other: Linear scarring within the abdomen as well as adhesion of the tail of the pancreas, spleen, and posterior body of the stomach. Midline anterior abdominal wall incisional scar. There is abutment of loops of small bowel to the anterior peritoneal wall compatible with adhesions. Musculoskeletal: No acute osseous pathology. IMPRESSION: 1. Small bowel ileus or developing obstruction. 2. Postsurgical changes with linear adhesion/scarring throughout the abdomen. 3. Sigmoid diverticulosis. 4. Thickened and nodular appearance of posterior gastric wall. Further evaluation with endoscopy is recommended. 5. Fatty liver. 6.  Aortic Atherosclerosis (ICD10-I70.0). Electronically Signed   By: Elgie Collard M.D.   On: 09/28/2023 17:36  Anti-infectives: Anti-infectives (From admission, onward)    None       Assessment/Plan: Impression: Diarrhea secondary to norovirus.  Patient not exhibiting symptoms of a bowel obstruction.  No need for surgical intervention at the present time.  Will sign off.  Please call us if we could be of further assistance.  LOS: 2 days    Franky Macho 09/30/2023

## 2023-09-30 NOTE — Progress Notes (Addendum)
  Progress Note   Patient: Peter [name] WJX:[id number] DOB: Mar 31, 1971 DOA: 09/28/2023     2 DOS: the patient was seen and examined on 09/30/2023   Brief hospital course: Mr. Yamanaka was admitted to the hospital with the working  diagnosis of ileus due to norovirus.   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  CT abdomen/pelvis showed small bowel ileus or developing obstruction.  General surgery consulted and following   Assessment and Plan: Ileus (HCC) Norovirus gastroenteritis  Patient with progressive improvement in his symptoms, diarrhea is improving, no abdominal pain, no nausea or vomiting.  Passing gas.   Supportive medical care with antiacids, analgesics and antiemetics.  Plan to continue monitor improvement of his symptoms, and discharge home tomorrow.   CKD (chronic kidney disease) stage 3, GFR 30-59 ml/min (HCC) CKD stage 3b . Non anion gap metabolic acidosis.  Renal function has been stable, today renal function with serum cr at 1,74 with K at 4,4 and serum bicarbonate at 19  Na 137  Mg 2.0   Will add sodium bicarbonate today  and follow up electrolytes in am.  Patient with improvement in po intake.    DVT (deep venous thrombosis) (HCC) Will resume anticoagulation with rivaroxaban.   Obesity Calculated BMI is 33.5   Thrombocytopenia (HCC) Sable cell count.   Type 2 diabetes mellitus with hyperglycemia (HCC) Continue glucose cover and monitoring with insulin sliding scale. Patient is tolerating po well.   Dyslipidemia, continue with statin therapy.   Chronic pancreatitis (HCC) Continue with pancreatic enzymes.   Depression Continue with amitriptyline and topiramate.          Subjective: patient with improvement of diarrhea, no nausea or vomiting and no abdominal pain   Physical Exam: Vitals:   09/29/23 0435 09/29/23 2013 09/30/23 0609 09/30/23 1328  BP: 113/81 113/88 114/80 135/88  Pulse: 92 73 68 71  Resp: 14 20 18 18   Temp: 99 F (37.2 C) 98.9 F (37.2 C) 98.4 F (36.9 C) 98.2 F (36.8 C)  TempSrc: Oral Oral Oral Oral  SpO2: 97% 97% 98% 100%  Weight:      Height:       Neurology awake and alert ENT with mild pallor Cardiovascular with S1 and S2 present and regular with no gallops Respiratory with no rales or wheezing Abdomen with no distention, non tender  No lower extremity edema  Data Reviewed:   Family Communication: no family at the bedside   Disposition: Status is: Inpatient Remains inpatient appropriate because: plan for discharge home tomorrow   Planned Discharge Destination: Home      Author: Coralie Keens, MD 09/30/2023 4:26 PM  For on call review [email or web address].

## 2023-09-30 NOTE — Assessment & Plan Note (Addendum)
Patient was placed on insulin sliding scale for glucose cover and monitoring.  His glucose remained stable. At the time of his discharge he will resume SGLT 2 inh, lantus and novolog insulin.  Patient is tolerating po well.   Dyslipidemia, continue with statin therapy.

## 2023-09-30 NOTE — Progress Notes (Signed)
Received call from lab stating patient's GI panel came back positive for Norovirus. Dr. Thomes Dinning notified.

## 2023-10-01 DIAGNOSIS — E785 Hyperlipidemia, unspecified: Secondary | ICD-10-CM

## 2023-10-01 DIAGNOSIS — E1169 Type 2 diabetes mellitus with other specified complication: Secondary | ICD-10-CM

## 2023-10-01 DIAGNOSIS — K567 Ileus, unspecified: Secondary | ICD-10-CM | POA: Diagnosis not present

## 2023-10-01 DIAGNOSIS — N1832 Chronic kidney disease, stage 3b: Secondary | ICD-10-CM | POA: Diagnosis not present

## 2023-10-01 DIAGNOSIS — I824Y3 Acute embolism and thrombosis of unspecified deep veins of proximal lower extremity, bilateral: Secondary | ICD-10-CM | POA: Diagnosis not present

## 2023-10-01 LAB — BASIC METABOLIC PANEL
Anion gap: 10 (ref 5–15)
BUN: 22 mg/dL — ABNORMAL HIGH (ref 6–20)
CO2: 23 mmol/L (ref 22–32)
Calcium: 8.9 mg/dL (ref 8.9–10.3)
Chloride: 102 mmol/L (ref 98–111)
Creatinine, Ser: 1.71 mg/dL — ABNORMAL HIGH (ref 0.61–1.24)
GFR, Estimated: 47 mL/min — ABNORMAL LOW (ref >60)
Glucose, Bld: 151 mg/dL — ABNORMAL HIGH (ref 70–99)
Potassium: 4 mmol/L (ref 3.5–5.1)
Sodium: 135 mmol/L (ref 135–145)

## 2023-10-01 LAB — GLUCOSE, CAPILLARY
Glucose-Capillary: 118 mg/dL — ABNORMAL HIGH (ref 70–99)
Glucose-Capillary: 138 mg/dL — ABNORMAL HIGH (ref 70–99)
Glucose-Capillary: 182 mg/dL — ABNORMAL HIGH (ref 70–99)

## 2023-10-01 LAB — MAGNESIUM: Magnesium: 2 mg/dL (ref 1.7–2.4)

## 2023-10-01 NOTE — Discharge Summary (Signed)
Physician Discharge Summary   Patient: Peter [name] MRN: [id number] DOB: September 26, 1971  Admit date:     09/28/2023  Discharge date: 10/01/23  Discharge Physician: Coralie Keens   PCP: System, Provider Not In   Recommendations at discharge:    Patient will resume his home medications. Plan to follow up with primary care in 7 to 10 days.  He may need outpatient endoscopic evaluation per CT findings on stomach fundus.   Discharge Diagnoses: Active Problems:   Ileus (HCC)   CKD (chronic kidney disease) stage 3, GFR 30-59 ml/min (HCC)   DVT (deep venous thrombosis) (HCC)   Obesity   Thrombocytopenia (HCC)   Type 2 diabetes mellitus with hyperlipidemia (HCC)   Chronic pancreatitis (HCC)   Depression  Resolved Problems:   * No resolved hospital problems. [facility] Course: [name] was admitted to the hospital with the working  diagnosis of ileus due to norovirus.   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   On his initial physical examination his blood pressure was 139/91, HR 97, RR 18 and 02 saturation 100%, lungs with no wheezing or rales, heart with S1 and S2 present and regular, no gallops or rubs, abdomen with tenderness to palpation left lower quadrant with no guarding or rebound, no lower extremity edema.   Na 137, K 4.3 Cl 101, bicarbonate 23, glucose 139, bun 18 cr 1,8  Lipase 33, AST 28 ALT 25 total bilirubin 1,5  Wbc 6.0 hgb 15.7 plt 146  Urine analysis SG 1,033, protein 100, glucose >500, negative for leukocytes and negative for hgb,  C diff negative  Gastrointestinal viral panel positive for Norovirus.   CT abdomen and pelvis with small bowel ileus or developing  obstruction.  Postsurgical changes with linear adhesion/ scaring throughout the abdomen.  Sigmoid diverticulosis.  Thickened and nodular appearance of the posterior gastric wall. (Recommended further endoscopic evaluation).  Fatty liver.   Patient was placed on supportive medical therapy including IV fluids, antiacids and as needed antiemetics.   12/24 his diarrhea has improved. Patient will follow up as outpatient.   Assessment and Plan: Ileus (HCC) Norovirus gastroenteritis  General surgery was consulted with recommendations for supportive medical therapy.  Patient with progressive improvement in his symptoms, diarrhea is improving, no abdominal pain, no nausea or vomiting.  Passing gas.   Supportive medical care with antiacids, analgesics and antiemetics.  Patient is tolerating po well, today, plan for discharge home and follow up as outpatient.   CKD (chronic kidney disease) stage 3, GFR 30-59 ml/min (HCC) CKD stage 3b . Non anion gap metabolic acidosis.   Patient was placed on IV fluids and his electrolytes were corrected.  At the time of his discharge his renal function has a serum cr of 1,7 with K at 4,0 and serum bicarbonate at 23.  Na 135 and Mg 2.0   Patient is tolerating po well.  Plan to follow up renal function and electrolytes as outpatient.   DVT (deep venous thrombosis) (HCC) Continue anticoagulation with apixaban.   Obesity Calculated BMI is 33.5   Thrombocytopenia (HCC) Sable cell count.   Type 2 diabetes mellitus with hyperlipidemia (HCC) Patient was placed on insulin sliding scale for glucose cover and monitoring.  His glucose remained stable. At the time of his discharge  he will resume SGLT 2 inh, lantus and novolog insulin.  Patient is tolerating po well.   Dyslipidemia, continue with statin therapy.   Chronic pancreatitis (HCC) Continue with pancreatic enzymes.   Depression Continue with amitriptyline and topiramate.           Consultants: general surgery  Procedures performed: none   Disposition: Home Diet recommendation:  Discharge Diet Orders (From admission, onward)     Start     Ordered   10/01/23 0000  Diet - low sodium heart healthy        10/01/23 1040           Cardiac and Carb modified diet DISCHARGE MEDICATION: Allergies as of 10/01/2023       Reactions   Ace Inhibitors Shortness Of Breath, Swelling   Lip swelling   Coconut (cocos Nucifera) Swelling   Iodinated Contrast Media Anaphylaxis   Patient is allergic to IV contrast per Dr Rolland Porter.   Iodine    Patient experienced shortness of breath and chest tightness. Albuterol and benadryl were required.   Metrizamide Anaphylaxis   Patient is allergic to IV contrast per Dr Rolland Porter.   Coconut Flavoring Agent (non-screening) Swelling        Medication List     TAKE these medications    acetaminophen 500 MG tablet Commonly known as: TYLENOL Take 500 mg by mouth every 6 (six) hours as needed for moderate pain (pain score 4-6).   atorvastatin 80 MG tablet Commonly known as: LIPITOR Take 80 mg by mouth daily.   cholecalciferol 25 MCG (1000 UNIT) tablet Commonly known as: VITAMIN D3 Take 3,000 Units by mouth every morning.   colchicine 0.6 MG tablet Take 1 tablet (0.6 mg total) by mouth daily.   Creon 36000-114000 units Cpep capsule Generic drug: lipase/protease/amylase Take 36,000-108,000 Units by mouth 3 (three) times daily with meals. 1 with snacks and 3 with meals   cyanocobalamin 1000 MCG tablet Commonly known as: VITAMIN B12 Take 1,000 mcg by mouth every Monday, Wednesday, and Friday.   cyclobenzaprine 10 MG tablet Commonly known as: FLEXERIL Take 1 tablet (10 mg total) by mouth 2 (two) times daily as needed for muscle spasms.   docusate sodium 100 MG capsule Commonly known as: COLACE Take 100 mg by mouth 2 (two) times daily. For constipation   Eliquis 5 MG Tabs tablet Generic drug: apixaban Take 5 mg  by mouth 2 (two) times daily.   ferrous sulfate 325 (65 FE) MG tablet Take 325 mg by mouth daily.   folic acid 1 MG tablet Commonly known as: FOLVITE Take 1 mg by mouth every morning.   Jardiance 10 MG Tabs tablet Generic drug: empagliflozin Take 10 mg by mouth daily.   Lantus SoloStar 100 UNIT/ML Solostar Pen Generic drug: insulin glargine Inject 40 Units into the skin at bedtime.   lidocaine 5 % Commonly known as: Lidoderm Place 1 patch onto the skin daily. Remove & Discard patch within 12 hours or as directed by MD   magnesium oxide 400 (241.3 Mg) MG tablet Commonly known as: MAG-OX Take 1 tablet by mouth 2 (two) times daily.   multivitamin with minerals Tabs tablet Take 1 tablet by mouth every morning.   NovoLOG FlexPen 100 UNIT/ML FlexPen Generic drug: insulin aspart Inject 18 Units into the skin 3 (three) times daily with meals.   topiramate 50 MG tablet Commonly known as: TOPAMAX Take 50-100 mg by mouth 2 (two) times daily. 1 tablet in the Am and  2 tablets at night        Follow-up Information     [facility].   Specialty: Emergency Medicine Why: If symptoms worsen including worse pain, return of nausea or vomiting, abdominal bloating. Contact information: [address] [place] [address] [phone]               Discharge Exam: Ceasar Mons Weights   09/28/23 1537 09/28/23 2040  Weight: 111.1 kg 109 kg   BP 112/85 (BP Location: Left Arm)   Pulse 77   Temp 98.2 F (36.8 C) (Oral)   Resp 18   Ht 5\' 11"  (1.803 m)   Wt 109 kg   SpO2 98%   BMI 33.52 kg/m   Patient is feeling well, no chest pain or dyspnea, diarrhea continue to improve, no nausea or vomiting, no abdominal pain   Neurology awake and alert ENT with no pallor or icterus Cardiovascular with S1 and S2 present and regular with no gallops or rubs Respiratory with no rales or wheezing Abdomen with no distention, non tender  to palpation with normal bowel sounds   No lower extremity edema   Condition at discharge: stable  The results of significant diagnostics from this hospitalization (including imaging, microbiology, ancillary and laboratory) are listed below for reference.   Imaging Studies: CT ABDOMEN PELVIS WO CONTRAST Result Date: 09/28/2023 CLINICAL DATA:  Diverticulitis. Abdominal pain. Nausea vomiting and diarrhea. EXAM: CT ABDOMEN AND PELVIS WITHOUT CONTRAST TECHNIQUE: Multidetector CT imaging of the abdomen and pelvis was performed following the standard protocol without IV contrast. RADIATION DOSE REDUCTION: This exam was performed according to the departmental dose-optimization program which includes automated exposure control, adjustment of the mA and/or kV according to patient size and/or use of iterative reconstruction technique. COMPARISON:  CT abdomen pelvis dated 01/04/2020. FINDINGS: Evaluation of this exam is limited in the absence of intravenous contrast. Lower chest: The visualized lung bases are clear. No intra-abdominal free air or free fluid. Hepatobiliary: There is fatty infiltration of the liver. No biliary dilatation. The gallbladder is unremarkable. Pancreas: The pancreas is atrophic.  No active inflammatory changes. Spleen: The spleen is unremarkable. Adrenals/Urinary Tract: The adrenal glands are unremarkable. There is no hydronephrosis or nephrolithiasis on either side. The visualized ureters appear unremarkable. The urinary bladder is minimally distended. There is trabeculated bladder wall suggestive of chronic bladder outlet obstruction. Stomach/Bowel: Evaluation of the bowel is limited in the absence of oral contrast. There is postsurgical changes of the bowel with anastomotic staple lines. Multiple fluid-filled loops of small bowel measure up to 4 cm in diameter in the right lateral abdomen and may represent ileus or developing obstruction. There is loose stool in the colon. There is  sigmoid diverticulosis without active inflammatory changes. Thickened and nodular appearance of posterior gastric wall, not evaluated on this CT. Further evaluation with endoscopy is recommended. Appendectomy. Vascular/Lymphatic: Mild aortoiliac atherosclerotic disease. The IVC is unremarkable. No portal venous gas. There is no adenopathy. Reproductive: The prostate and seminal vesicles are grossly unremarkable. Other: Linear scarring within the abdomen as well as adhesion of the tail of the pancreas, spleen, and posterior body of the stomach. Midline anterior abdominal wall incisional scar. There is abutment of loops of small bowel to the anterior peritoneal wall compatible with adhesions. Musculoskeletal: No acute osseous pathology. IMPRESSION: 1. Small bowel ileus or developing obstruction. 2. Postsurgical changes with linear adhesion/scarring throughout the abdomen. 3. Sigmoid diverticulosis. 4. Thickened and nodular appearance of posterior  gastric wall. Further evaluation with endoscopy is recommended. 5. Fatty liver. 6.  Aortic Atherosclerosis (ICD10-I70.0). Electronically Signed   By: Elgie Collard M.D.   On: 09/28/2023 17:36    Microbiology: Results for orders placed or performed during the hospital encounter of 09/28/23  C Difficile Quick Screen w PCR reflex     Status: None   Collection Time: 09/28/23  9:48 PM   Specimen: STOOL  Result Value Ref Range Status   C Diff antigen NEGATIVE NEGATIVE Final   C Diff toxin NEGATIVE NEGATIVE Final   C Diff interpretation No C. difficile detected.  Final    Comment: Performed at [facility], [address] [place], [place] [address]  Gastrointestinal Panel by PCR , Stool     Status: Abnormal   Collection Time: 09/28/23  9:48 PM   Specimen: STOOL  Result Value Ref Range Status   Campylobacter species NOT DETECTED NOT DETECTED Final   Plesimonas shigelloides NOT DETECTED NOT DETECTED Final   Salmonella species NOT DETECTED NOT DETECTED Final    Yersinia enterocolitica NOT DETECTED NOT DETECTED Final   Vibrio species NOT DETECTED NOT DETECTED Final   Vibrio cholerae NOT DETECTED NOT DETECTED Final   Enteroaggregative E coli (EAEC) NOT DETECTED NOT DETECTED Final   Enteropathogenic E coli (EPEC) NOT DETECTED NOT DETECTED Final   Enterotoxigenic E coli (ETEC) NOT DETECTED NOT DETECTED Final   Shiga like toxin producing E coli (STEC) NOT DETECTED NOT DETECTED Final   Shigella/Enteroinvasive E coli (EIEC) NOT DETECTED NOT DETECTED Final   Cryptosporidium NOT DETECTED NOT DETECTED Final   Cyclospora cayetanensis NOT DETECTED NOT DETECTED Final   Entamoeba histolytica NOT DETECTED NOT DETECTED Final   Giardia lamblia NOT DETECTED NOT DETECTED Final   Adenovirus F40/41 NOT DETECTED NOT DETECTED Final   Astrovirus NOT DETECTED NOT DETECTED Final   Norovirus GI/GII DETECTED (A) NOT DETECTED Final    Comment: CRITICAL RESULT CALLED TO, READ BACK BY AND VERIFIED WITH: Gilman Buttner RN AP @0021  09/30/23 lfd    Rotavirus A NOT DETECTED NOT DETECTED Final   Sapovirus (I, II, IV, and V) NOT DETECTED NOT DETECTED Final    Comment: Performed at [facility], [address] [place], [place] [address]    Labs: CBC: Recent Labs  Lab 09/28/23 1600 09/29/23 0628  WBC 6.0 4.0  HGB 15.7 15.0  HCT 49.7 48.3  MCV 85.1 83.7  PLT 146* 146*   Basic Metabolic Panel: Recent Labs  Lab 09/28/23 1600 09/29/23 0628 10/01/23 0430  NA 137 137 135  K 4.3 4.4 4.0  CL 101 106 102  CO2 23 19* 23  GLUCOSE 139* 124* 151*  BUN 18 22* 22*  CREATININE 1.82* 1.74* 1.71*  CALCIUM 9.9 9.3 8.9  MG  --  2.0 2.0  PHOS  --  3.9  --    Liver Function Tests: Recent Labs  Lab 09/28/23 1600 09/29/23 0628  AST 28 32  ALT 25 27  ALKPHOS 85 79  BILITOT 1.5* 1.3*  PROT 8.6* 8.1  ALBUMIN 5.0 4.3   CBG: Recent Labs  Lab 09/30/23 1631 09/30/23 2043 10/01/23 0003 10/01/23 0411 10/01/23 0722  GLUCAP 110* 152* 182* 138* 118*    Discharge  time spent: greater than 30 minutes.  Signed: Coralie Keens, MD [facility] 10/01/2023

## 2023-10-01 NOTE — Plan of Care (Signed)
Patient without distress. Complained of abdominal discomfort once, relieved by ordered pain medication. Patient stated that he had one soft bowel movement yesterday evening. Will continue to monitor.   Problem: Education: Goal: Knowledge of General Education information will improve Description: Including pain rating scale, medication(s)/side effects and non-pharmacologic comfort measures Outcome: Progressing   Problem: Health Behavior/Discharge Planning: Goal: Ability to manage health-related needs will improve Outcome: Progressing   Problem: Clinical Measurements: Goal: Ability to maintain clinical measurements within normal limits will improve Outcome: Progressing Goal: Will remain free from infection Outcome: Progressing Goal: Diagnostic test results will improve Outcome: Progressing Goal: Respiratory complications will improve Outcome: Progressing Goal: Cardiovascular complication will be avoided Outcome: Progressing   Problem: Activity: Goal: Risk for activity intolerance will decrease Outcome: Progressing   Problem: Nutrition: Goal: Adequate nutrition will be maintained Outcome: Progressing   Problem: Elimination: Goal: Will not experience complications related to bowel motility Outcome: Progressing Goal: Will not experience complications related to urinary retention Outcome: Progressing   Problem: Pain Management: Goal: General experience of comfort will improve Outcome: Progressing   Problem: Safety: Goal: Ability to remain free from injury will improve Outcome: Progressing   Problem: Skin Integrity: Goal: Risk for impaired skin integrity will decrease Outcome: Progressing   Problem: Education: Goal: Ability to describe self-care measures that may prevent or decrease complications (Diabetes Survival Skills Education) will improve Outcome: Progressing Goal: Individualized Educational Video(s) Outcome: Progressing   Problem: Coping: Goal: Ability to  adjust to condition or change in health will improve Outcome: Progressing   Problem: Fluid Volume: Goal: Ability to maintain a balanced intake and output will improve Outcome: Progressing   Problem: Health Behavior/Discharge Planning: Goal: Ability to identify and utilize available resources and services will improve Outcome: Progressing Goal: Ability to manage health-related needs will improve Outcome: Progressing   Problem: Metabolic: Goal: Ability to maintain appropriate glucose levels will improve Outcome: Progressing   Problem: Nutritional: Goal: Maintenance of adequate nutrition will improve Outcome: Progressing Goal: Progress toward achieving an optimal weight will improve Outcome: Progressing   Problem: Skin Integrity: Goal: Risk for impaired skin integrity will decrease Outcome: Progressing   Problem: Tissue Perfusion: Goal: Adequacy of tissue perfusion will improve Outcome: Progressing

## 2023-10-01 NOTE — Plan of Care (Signed)
  Problem: Education: Goal: Knowledge of General Education information will improve Description: Including pain rating scale, medication(s)/side effects and non-pharmacologic comfort measures Outcome: Progressing   Problem: Health Behavior/Discharge Planning: Goal: Ability to manage health-related needs will improve Outcome: Progressing   Problem: Clinical Measurements: Goal: Ability to maintain clinical measurements within normal limits will improve Outcome: Progressing Goal: Will remain free from infection Outcome: Progressing Goal: Diagnostic test results will improve Outcome: Progressing Goal: Respiratory complications will improve Outcome: Progressing Goal: Cardiovascular complication will be avoided Outcome: Progressing   Problem: Activity: Goal: Risk for activity intolerance will decrease Outcome: Progressing   Problem: Nutrition: Goal: Adequate nutrition will be maintained Outcome: Progressing   Problem: Elimination: Goal: Will not experience complications related to bowel motility Outcome: Progressing Goal: Will not experience complications related to urinary retention Outcome: Progressing   Problem: Pain Management: Goal: General experience of comfort will improve Outcome: Progressing   Problem: Safety: Goal: Ability to remain free from injury will improve Outcome: Progressing   Problem: Skin Integrity: Goal: Risk for impaired skin integrity will decrease Outcome: Progressing   Problem: Education: Goal: Ability to describe self-care measures that may prevent or decrease complications (Diabetes Survival Skills Education) will improve Outcome: Progressing Goal: Individualized Educational Video(s) Outcome: Progressing   Problem: Coping: Goal: Ability to adjust to condition or change in health will improve Outcome: Progressing   Problem: Fluid Volume: Goal: Ability to maintain a balanced intake and output will improve Outcome: Progressing   Problem:  Health Behavior/Discharge Planning: Goal: Ability to identify and utilize available resources and services will improve Outcome: Progressing Goal: Ability to manage health-related needs will improve Outcome: Progressing   Problem: Metabolic: Goal: Ability to maintain appropriate glucose levels will improve Outcome: Progressing   Problem: Nutritional: Goal: Maintenance of adequate nutrition will improve Outcome: Progressing Goal: Progress toward achieving an optimal weight will improve Outcome: Progressing   Problem: Skin Integrity: Goal: Risk for impaired skin integrity will decrease Outcome: Progressing   Problem: Tissue Perfusion: Goal: Adequacy of tissue perfusion will improve Outcome: Progressing

## 2023-10-01 NOTE — Progress Notes (Signed)
Nsg Discharge Note  Admit Date:  09/28/2023 Discharge date: 10/01/2023   Maceo Pro to be D/C'd Home per MD order.  AVS completed.   Patient/caregiver able to verbalize understanding.  Discharge Medication: Allergies as of 10/01/2023       Reactions   Ace Inhibitors Shortness Of Breath, Swelling   Lip swelling   Coconut (cocos Nucifera) Swelling   Iodinated Contrast Media Anaphylaxis   Patient is allergic to IV contrast per Dr Rolland Porter.   Iodine    Patient experienced shortness of breath and chest tightness. Albuterol and benadryl were required.   Metrizamide Anaphylaxis   Patient is allergic to IV contrast per Dr Rolland Porter.   Coconut Flavoring Agent (non-screening) Swelling        Medication List     TAKE these medications    acetaminophen 500 MG tablet Commonly known as: TYLENOL Take 500 mg by mouth every 6 (six) hours as needed for moderate pain (pain score 4-6).   atorvastatin 80 MG tablet Commonly known as: LIPITOR Take 80 mg by mouth daily.   cholecalciferol 25 MCG (1000 UNIT) tablet Commonly known as: VITAMIN D3 Take 3,000 Units by mouth every morning.   colchicine 0.6 MG tablet Take 1 tablet (0.6 mg total) by mouth daily.   Creon 36000-114000 units Cpep capsule Generic drug: lipase/protease/amylase Take 36,000-108,000 Units by mouth 3 (three) times daily with meals. 1 with snacks and 3 with meals   cyanocobalamin 1000 MCG tablet Commonly known as: VITAMIN B12 Take 1,000 mcg by mouth every Monday, Wednesday, and Friday.   cyclobenzaprine 10 MG tablet Commonly known as: FLEXERIL Take 1 tablet (10 mg total) by mouth 2 (two) times daily as needed for muscle spasms.   docusate sodium 100 MG capsule Commonly known as: COLACE Take 100 mg by mouth 2 (two) times daily. For constipation   Eliquis 5 MG Tabs tablet Generic drug: apixaban Take 5 mg by mouth 2 (two) times daily.   ferrous sulfate 325 (65 FE) MG tablet Take 325 mg by mouth daily.    folic acid 1 MG tablet Commonly known as: FOLVITE Take 1 mg by mouth every morning.   Jardiance 10 MG Tabs tablet Generic drug: empagliflozin Take 10 mg by mouth daily.   Lantus SoloStar 100 UNIT/ML Solostar Pen Generic drug: insulin glargine Inject 40 Units into the skin at bedtime.   lidocaine 5 % Commonly known as: Lidoderm Place 1 patch onto the skin daily. Remove & Discard patch within 12 hours or as directed by MD   magnesium oxide 400 (241.3 Mg) MG tablet Commonly known as: MAG-OX Take 1 tablet by mouth 2 (two) times daily.   multivitamin with minerals Tabs tablet Take 1 tablet by mouth every morning.   NovoLOG FlexPen 100 UNIT/ML FlexPen Generic drug: insulin aspart Inject 18 Units into the skin 3 (three) times daily with meals.   topiramate 50 MG tablet Commonly known as: TOPAMAX Take 50-100 mg by mouth 2 (two) times daily. 1 tablet in the Am and 2 tablets at night        Discharge Assessment: Vitals:   09/30/23 2045 10/01/23 0412  BP: (!) 127/99 112/85  Pulse: 72 77  Resp: 18 18  Temp: 99.2 F (37.3 C) 98.2 F (36.8 C)  SpO2: 98% 98%   Skin clean, dry and intact without evidence of skin break down, no evidence of skin tears noted. IV catheter discontinued intact. Site without signs and symptoms of complications - no redness or  edema noted at insertion site, patient denies c/o pain - only slight tenderness at site.  Dressing with slight pressure applied.  D/c Instructions-Education: Discharge instructions given to patient/family with verbalized understanding. D/c education completed with patient/family including follow up instructions, medication list, d/c activities limitations if indicated, with other d/c instructions as indicated by MD - patient able to verbalize understanding, all questions fully answered. Patient instructed to return to ED, call 911, or call MD for any changes in condition.  Patient escorted via WC, and D/C home via private  auto.  Laurena Spies, RN 10/01/2023 10:48 AM

## 2024-09-28 ENCOUNTER — Encounter (HOSPITAL_COMMUNITY): Payer: Self-pay

## 2024-09-28 ENCOUNTER — Other Ambulatory Visit: Payer: Self-pay

## 2024-09-28 ENCOUNTER — Emergency Department (HOSPITAL_COMMUNITY): Admission: EM | Admit: 2024-09-28 | Discharge: 2024-09-28 | Disposition: A | Discharge status: Home / Self Care

## 2024-09-28 ENCOUNTER — Emergency Department (HOSPITAL_COMMUNITY)

## 2024-09-28 DIAGNOSIS — Z79899 Other long term (current) drug therapy: Secondary | ICD-10-CM | POA: Insufficient documentation

## 2024-09-28 DIAGNOSIS — Z7901 Long term (current) use of anticoagulants: Secondary | ICD-10-CM | POA: Insufficient documentation

## 2024-09-28 DIAGNOSIS — M25512 Pain in left shoulder: Secondary | ICD-10-CM | POA: Diagnosis present

## 2024-09-28 MED ORDER — LIDOCAINE 5 % EX PTCH
1.0000 | MEDICATED_PATCH | CUTANEOUS | Status: DC
  Administered 2024-09-28: 1 via TRANSDERMAL
  Filled 2024-09-28: qty 1

## 2024-09-28 MED ORDER — DICLOFENAC SODIUM 1 % EX GEL: 4.0000 g | Freq: Four times a day (QID) | CUTANEOUS | 0 refills | Status: AC

## 2024-09-28 MED ORDER — TRAMADOL HCL 50 MG PO TABS: 50.0000 mg | ORAL_TABLET | Freq: Four times a day (QID) | ORAL | 0 refills | Status: AC | PRN

## 2024-09-28 NOTE — Discharge Instructions (Signed)
 ### Shoulder Pain Discharge Instructions     **Your Diagnosis**      You were seen in the emergency room today for left shoulder pain that is worse when you lift your arm to the side. Your X-ray did not show any broken bones or other problems. **The exact cause of your shoulder pain is not yet known**, but your orthopedic specialist will help determine this at your follow-up appointment.      **Your Medications**      Take the following medications as directed for pain:      - **Acetaminophen  (Tylenol )**: Take as directed on the package for pain relief      - **Voltaren  gel**: Apply to your shoulder as directed on the package      - **Lidocaine  patches**: Apply over-the-counter patches to your shoulder for pain relief      - **Tramadol **: Take as prescribed for severe pain and at night to help you sleep      **Using Your Sling**      You may use a sling for comfort, especially during the first few days. However,[1][2] **it is very important that you do not wear the sling all the time**. Keeping your shoulder completely still can cause it to become stiff and lose range of motion. Remove the sling several times each day to do the exercises described below.[3][4]      **Shoulder Exercises - Very Important!**      **You must begin gentle shoulder exercises right away to prevent your shoulder from becoming stiff.** These exercises help maintain your shoulder's range of motion and prevent complications from sling use.[3][4]      Do these exercises 3-5 times per day:[4]      1. **Pendulum exercises**: Lean forward and let your affected arm hang down. Gently swing your arm in small circles, forward and backward, and side to side. Do this for 1-2 minutes.[4]      2. **Assisted forward reach**: Use your good arm to help lift your affected arm forward and upward as far as you can without severe pain. Hold for 5-10 seconds. Repeat 10 times.[4]      3. **Wall walk**: Face a wall and use your  fingers to walk your affected arm up the wall as high as you can comfortably go. Hold for 5-10 seconds, then walk your fingers back down. Repeat 10 times.      4. **Gentle external rotation stretch**: With your elbow at your side and bent at 90 degrees, use your good arm or a stick to gently rotate your affected arm outward. Hold for 5-10 seconds. Repeat 10 times.[4]      **Important exercise guidelines:**      - Start gently and gradually increase the stretch over time      - Some discomfort is normal, but stop if you have severe pain      - Be consistent - doing these exercises regularly is the key to preventing shoulder stiffness[3]      - Remove your sling to perform these exercises      **Follow-Up Care**      **You must schedule an appointment with an orthopedic specialist within 1-2 weeks.** The orthopedic doctor will evaluate your shoulder to determine the cause of your pain and may recommend additional treatments such as:      - Physical therapy with a trained therapist      - Additional imaging studies if needed      - Other treatments based  on the specific diagnosis      **When to Seek Immediate Care**      Return to the emergency room or call your doctor right away if you develop:      - Severe pain that is not controlled by your medications      - Numbness or tingling in your arm or hand      - Weakness in your arm      - Fever or signs of infection      - Any other concerning symptoms      **Remember:** Starting your shoulder exercises now and following up with orthopedics is essential to prevent your shoulder from becoming stiff and to ensure proper diagnosis and treatment.      ### References  1. Acute Shoulder Injuries in Adults. Ray LEEKS, Nguyen V, [place] NM. American Family Physician. 2023;107(5):503-512. 2. Acute Shoulder Injuries in Adults. Odella PARAS, [place] Z, Korsh J, Gatt C. American Family Physician. 2016;94(2):119-27. 3. Management of  Rotator Cuff Injuries: Evidence-Based Clinical Practice Guideline. Cordella FELIX Delores MD PhD, Garnette Patient MD, Taras Bailiff MD, et al. American Academy of Orthopaedic Surgeons [phone]). 4. The American Society of Shoulder and Elbow Therapists' Consensus Statement on Rehabilitation for Anatomic Total Shoulder Arthroplasty. Nyle PURCHASE, Garrigues GE, Pozzi F, et al. Journal of Shoulder and Elbow Surgery. 2020;29(10):2149-2162. doi:10.1016/j.jse.2020.05.019.

## 2024-09-28 NOTE — ED Provider Notes (Signed)
 " [place] EMERGENCY DEPARTMENT AT [facility] Provider Note   CSN: [id number] Arrival date & time: 09/28/24  9197     Patient presents with: Shoulder Pain   Peter [name] is a 53 y.o. male presenting for emergency department with chief complaint of left shoulder pain.  Patient reports that he has had progressively worsening left shoulder pain he was having some aching last weekend and then started having a lot of pain in the left shoulder worse with abduction and movement since Tuesday of last week.  He reports that pain hurts anytime he moves the shoulder or wakes him from sleep when he tries to roll over in bed.  He denies any numbness or tingling or weakness in the lower extremities he denies neck pain no known injuries he took his gout medication to see if it would help.  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     Shoulder Pain      Prior to Admission medications  Medication Sig Start Date End Date Taking? Authorizing Provider  acetaminophen  (TYLENOL ) 500 MG tablet Take 500 mg by mouth every 6 (six) hours as needed for moderate pain (pain score 4-6).    [provider]  apixaban  (ELIQUIS ) 5 MG TABS tablet Take 5 mg by mouth 2 (two) times daily.    [provider]  atorvastatin  (LIPITOR) 80 MG tablet Take 80 mg by mouth daily.    [provider]  cholecalciferol (VITAMIN D3) 25 MCG (1000 UT) tablet Take 3,000 Units by mouth every morning.    [provider]  colchicine  0.6 MG tablet Take 1 tablet (0.6 mg total) by mouth daily. 03/25/20   Nivia Colon, PA-C  CREON  36000 UNITS CPEP capsule Take 36,000-108,000 Units by mouth 3 (three) times daily with meals. 1 with snacks and 3 with meals 12/11/14   [provider]  cyclobenzaprine  (FLEXERIL ) 10 MG tablet Take 1 tablet (10 mg total) by mouth 2 (two) times daily as needed for muscle spasms. 08/20/23   Lang Norleen POUR, PA-C  docusate sodium  (COLACE) 100  MG capsule Take 100 mg by mouth 2 (two) times daily. For constipation 12/19/14   [provider]  ferrous sulfate  325 (65 FE) MG tablet Take 325 mg by mouth daily.    [provider]  folic acid  (FOLVITE ) 1 MG tablet Take 1 mg by mouth every morning.  04/13/15   [provider]  JARDIANCE 10 MG TABS tablet Take 10 mg by mouth daily.    [provider]  LANTUS SOLOSTAR 100 UNIT/ML Solostar Pen Inject 40 Units into the skin at bedtime. 09/12/22   [provider]  lidocaine  (LIDODERM ) 5 % Place 1 patch onto the skin daily. Remove & Discard patch within 12 hours or as directed by MD 08/20/23   Lang Norleen POUR, PA-C  magnesium  oxide (MAG-OX) 400 (241.3 MG) MG tablet Take 1 tablet by mouth 2 (two) times daily. 01/13/15   [provider]  Multiple Vitamin (MULTIVITAMIN WITH MINERALS) TABS tablet Take 1 tablet by mouth every morning.     [provider]  NOVOLOG  FLEXPEN 100 UNIT/ML FlexPen Inject 18 Units into the skin 3 (three) times daily with meals. 10/12/22   [provider]  topiramate  (TOPAMAX ) 50 MG tablet Take 50-100 mg by mouth 2 (two) times daily. 1 tablet in the Am and 2 tablets at night    [provider]  vitamin B-12 (CYANOCOBALAMIN)  1000 MCG tablet Take 1,000 mcg by mouth every Monday, Wednesday, and Friday.     [provider]  diphenhydrAMINE  (SOMINEX) 25 MG tablet Take 50 mg by mouth as needed. For allergic reaction  12/29/11  [provider]  nitroGLYCERIN  (NITROSTAT ) 0.4 MG SL tablet Place 1 tablet (0.4 mg total) under the tongue every 5 (five) minutes as needed for chest pain. 10/16/11 12/29/11  Maple Carlin SAILOR, MD  omeprazole  (PRILOSEC  OTC) 20 MG tablet Take 1 tablet (20 mg total) by mouth daily. 10/16/11 12/29/11  Maple Carlin SAILOR, MD    Allergies: Ace inhibitors, Coconut (cocos nucifera), Iodinated contrast media, Iodine, Metrizamide, and Coconut flavoring agent (non-screening)    Review of  Systems  Updated Vital Signs BP (!) 155/92   Pulse 63   Temp 98.3 F (36.8 C) (Oral)   Resp 17   Ht 5' 11 (1.803 m)   Wt 108.9 kg   SpO2 96%   BMI 33.47 kg/m   Physical Exam Vitals and nursing note reviewed.  Constitutional:      General: He is not in acute distress.    Appearance: He is well-developed. He is not diaphoretic.  HENT:     Head: Normocephalic and atraumatic.  Eyes:     General: No scleral icterus.    Conjunctiva/sclera: Conjunctivae normal.  Cardiovascular:     Rate and Rhythm: Normal rate and regular rhythm.     Heart sounds: Normal heart sounds.  Pulmonary:     Effort: Pulmonary effort is normal. No respiratory distress.     Breath sounds: Normal breath sounds.  Abdominal:     Palpations: Abdomen is soft.     Tenderness: There is no abdominal tenderness.  Musculoskeletal:     Cervical back: Normal range of motion and neck supple.     Comments: No tenderness to palpation of the left shoulder.  He has pain with passive and active abduction and external rotation.  Normal ipsilateral elbow and wrist examination, no significant weakness however he does have significant pain that seems to reduce strength a bit.  Neurovascularly intact.  Skin:    General: Skin is warm and dry.  Neurological:     Mental Status: He is alert.  Psychiatric:        Behavior: Behavior normal.     (all labs ordered are listed, but only abnormal results are displayed) Labs Reviewed - No data to display  EKG: None  Radiology: No results found.   Procedures   Medications Ordered in the ED - No data to display                                  Medical Decision Making Amount and/or Complexity of Data Reviewed Radiology: ordered.  Risk Prescription drug management.   Patient here with left shoulder pain.  Differential diagnosis includes adhesive capsulitis, tendinitis, bursitis, arthritis.  Symptoms seem to be musculoskeletal.  I visualized and interpreted a shoulder  x-ray.  There are no acute findings. Patient unable to take NSAIDs due to concomitant apixaban  use. Voltaren  gel lighted Derm sling for comfort with strict exercise to reduce chances of frozen shoulder Symptoms are not consistent with gout.  Patient given Ultram  for bedtime and severe pain, follow-up with orthopedics.  Strict return precautions. PDMP reviewed during this encounter.      Final diagnoses:  None    ED Discharge Orders     None  Arloa Chroman, PA-C 09/28/24 1656  "

## 2024-09-28 NOTE — ED Triage Notes (Signed)
 Pt came in POV with complaints of shoulder pain that started around last Tuesday. Pt states he didn't do anything to bring on the pain. Pt states he has a history of gout that he has had in his ankles previously. Pt states that he took his gout medicine today to see if it would help.
